# Patient Record
Sex: Female | Born: 2000 | Race: Black or African American | Hispanic: No | Marital: Single | Smoking: Never smoker
Health system: Southern US, Community
[De-identification: ages and names within clinical notes are randomized; demographics above are authoritative.]

## PROBLEM LIST (undated history)

## (undated) DIAGNOSIS — R0600 Dyspnea, unspecified: Secondary | ICD-10-CM

## (undated) DIAGNOSIS — K219 Gastro-esophageal reflux disease without esophagitis: Secondary | ICD-10-CM

## (undated) DIAGNOSIS — K5900 Constipation, unspecified: Secondary | ICD-10-CM

## (undated) DIAGNOSIS — M1990 Unspecified osteoarthritis, unspecified site: Secondary | ICD-10-CM

## (undated) DIAGNOSIS — K5909 Other constipation: Secondary | ICD-10-CM

## (undated) DIAGNOSIS — Z9109 Other allergy status, other than to drugs and biological substances: Secondary | ICD-10-CM

## (undated) DIAGNOSIS — E282 Polycystic ovarian syndrome: Secondary | ICD-10-CM

## (undated) DIAGNOSIS — R7303 Prediabetes: Secondary | ICD-10-CM

## (undated) DIAGNOSIS — I10 Essential (primary) hypertension: Secondary | ICD-10-CM

## (undated) DIAGNOSIS — K760 Fatty (change of) liver, not elsewhere classified: Secondary | ICD-10-CM

## (undated) DIAGNOSIS — R519 Headache, unspecified: Secondary | ICD-10-CM

## (undated) DIAGNOSIS — K589 Irritable bowel syndrome without diarrhea: Secondary | ICD-10-CM

## (undated) DIAGNOSIS — F419 Anxiety disorder, unspecified: Secondary | ICD-10-CM

## (undated) DIAGNOSIS — E739 Lactose intolerance, unspecified: Secondary | ICD-10-CM

## (undated) DIAGNOSIS — D649 Anemia, unspecified: Secondary | ICD-10-CM

## (undated) DIAGNOSIS — Z8679 Personal history of other diseases of the circulatory system: Secondary | ICD-10-CM

## (undated) DIAGNOSIS — M67432 Ganglion, left wrist: Secondary | ICD-10-CM

## (undated) DIAGNOSIS — F32A Depression, unspecified: Secondary | ICD-10-CM

## (undated) HISTORY — PX: TONSILLECTOMY AND ADENOIDECTOMY: SUR1326

## (undated) HISTORY — DX: Fatty (change of) liver, not elsewhere classified: K76.0

## (undated) HISTORY — DX: Lactose intolerance, unspecified: E73.9

## (undated) HISTORY — DX: Depression, unspecified: F32.A

## (undated) HISTORY — DX: Anxiety disorder, unspecified: F41.9

## (undated) HISTORY — DX: Essential (primary) hypertension: I10

## (undated) HISTORY — PX: ADENOIDECTOMY: SUR15

## (undated) HISTORY — PX: TONSILLECTOMY: SUR1361

## (undated) HISTORY — DX: Unspecified osteoarthritis, unspecified site: M19.90

## (undated) HISTORY — DX: Anemia, unspecified: D64.9

---
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
## 2020-02-06 ENCOUNTER — Ambulatory Visit (HOSPITAL_COMMUNITY)
Admission: EM | Admit: 2020-02-06 | Discharge: 2020-02-06 | Disposition: A | Discharge status: Home / Self Care | Payer: Medicaid Other | Attending: Family Medicine | Admitting: Family Medicine

## 2020-02-06 ENCOUNTER — Encounter (HOSPITAL_COMMUNITY): Payer: Self-pay | Admitting: Emergency Medicine

## 2020-02-06 ENCOUNTER — Other Ambulatory Visit: Payer: Self-pay

## 2020-02-06 DIAGNOSIS — S060X0A Concussion without loss of consciousness, initial encounter: Secondary | ICD-10-CM

## 2020-02-06 DIAGNOSIS — S0003XA Contusion of scalp, initial encounter: Secondary | ICD-10-CM

## 2020-02-06 MED ORDER — ONDANSETRON HCL 8 MG PO TABS: 8.0000 mg | ORAL_TABLET | Freq: Three times a day (TID) | ORAL | 0 refills | Status: DC | PRN

## 2020-02-06 MED ORDER — IBUPROFEN 600 MG PO TABS: 600.0000 mg | ORAL_TABLET | Freq: Four times a day (QID) | ORAL | 0 refills | Status: DC | PRN

## 2020-02-06 NOTE — Discharge Instructions (Signed)
  Rest  read concussion information Limit screen time Take ibuprofen 2 times a day for headache pain, take Zofran as needed for nausea Call your primary care doctor if not improving by the end of the week

## 2020-02-06 NOTE — ED Triage Notes (Signed)
PT hit her head while getting into a bunk bed at 1450 today. PT is now dizzy, has a headache, and eye pain. No LOC since incident. PT speaking appropriately and A&O x4.

## 2020-02-06 NOTE — ED Provider Notes (Signed)
[place]    CSN: [id number] Arrival date & time: 02/06/20  1637      History   Chief Complaint No chief complaint on file.   HPI Jacqueline Gonzalez is a 19 y.o. female.   HPI   Patient has back bedside her bedroom at home.  She was getting onto her bunk bed in order to lay on the bed and play video games.  She has leaned forward and did not bend down far enough and hit the top of her head on the rail of the upper bunk. She states it was immediately severely painful and she had to sit on the floor for a few minutes After this she was able to get into the bed, but felt "awful" She did not lose consciousness.  Ever since then, however, she has a headache, slightly dizzy, feels tired, feels nauseated No history of migraines No vomiting No numbness or weakness in arms or legs Brought in by her mother for evaluation  Past Medical History:  Diagnosis Date   Constipation    Environmental allergies    Lactose intolerance     Patient Active Problem List   Diagnosis Date Noted   Ganglion cyst of dorsum of left wrist 11/03/2019   Breast tenderness 08/04/2019   Constipation 01/21/2018   Irregular periods 01/21/2018   Generalized anxiety disorder 01/21/2018    Past Surgical History:  Procedure Laterality Date   ADENOIDECTOMY     TONSILLECTOMY      OB History   No obstetric history on file.      Home Medications    Prior to Admission medications   Medication Sig Start Date End Date Taking? Authorizing Provider  cetirizine (ZYRTEC) 10 MG tablet Take 1 tablet (10 mg total) by mouth daily. 11/22/18  Yes Tammi Klippel, Sherin, DO  docusate sodium (COLACE) 100 MG capsule Take 1 capsule (100 mg total) by mouth 2 (two) times daily. 11/03/19   Shirley, [place], DO  ibuprofen (ADVIL) 600 MG tablet Take 1 tablet (600 mg total) by mouth every 6 (six) hours as needed. 02/06/20   Raylene Everts, MD  ondansetron (ZOFRAN) 8 MG tablet Take 1 tablet (8 mg total) by mouth  every 8 (eight) hours as needed for nausea or vomiting. 02/06/20   Raylene Everts, MD  polyethylene glycol powder (GLYCOLAX/MIRALAX) 17 GM/SCOOP powder Take 17 g by mouth 2 (two) times daily as needed for mild constipation or moderate constipation. 11/03/19   Shirley, [place], DO    Family History Family History  Problem Relation Age of Onset   Asthma Father    Diabetes Maternal Grandmother    Diabetes Paternal Grandmother    Kidney disease Paternal Alanda Slim' disease Paternal Aunt     Social History Social History   Tobacco Use   Smoking status: Never Smoker   Smokeless tobacco: Never Used  Substance Use Topics   Alcohol use: Never   Drug use: Never     Allergies   Blue dyes (parenteral), Blueberry [vaccinium angustifolium], and Shellfish allergy   Review of Systems Review of Systems See HPI  Physical Exam Triage Vital Signs ED Triage Vitals  Enc Vitals Group     BP 02/06/20 1707 125/67     Pulse Rate 02/06/20 1707 75     Resp 02/06/20 1707 16     Temp 02/06/20 1707 99 F (37.2 C)     Temp Source 02/06/20 1707 Oral     SpO2 02/06/20 1707 100 %  Weight --      Height --      Head Circumference --      Peak Flow --      Pain Score 02/06/20 1708 9     Pain Loc --      Pain Edu? --      Excl. in [place]? --    No data found.  Updated Vital Signs BP 125/67    Pulse 75    Temp 99 F (37.2 C) (Oral)    Resp 16    LMP 01/03/2020    SpO2 100%      Physical Exam Constitutional:      General: She is not in acute distress.    Appearance: She is well-developed. She is obese.     Comments: Appears uncomfortable  HENT:     Head: Normocephalic and atraumatic.     Comments: No visible bruising or bleeding on the top of the scalp    Right Ear: Tympanic membrane and ear canal normal.     Left Ear: Tympanic membrane and ear canal normal.     Nose: Nose normal.     Mouth/Throat:     Pharynx: No posterior oropharyngeal erythema.  Eyes:      Conjunctiva/sclera: Conjunctivae normal.     Pupils: Pupils are equal, round, and reactive to light.  Cardiovascular:     Rate and Rhythm: Normal rate.  Pulmonary:     Effort: Pulmonary effort is normal. No respiratory distress.  Abdominal:     General: There is no distension.     Palpations: Abdomen is soft.  Musculoskeletal:        General: Normal range of motion.     Cervical back: Normal range of motion.  Skin:    General: Skin is warm and dry.  Neurological:     Mental Status: She is alert.     Cranial Nerves: No cranial nerve deficit.     Sensory: No sensory deficit.     Motor: No weakness.     Coordination: Coordination normal.     Gait: Gait normal.     Deep Tendon Reflexes: Reflexes normal.  Psychiatric:        Behavior: Behavior normal.     Comments: Quiet.  Short responses      UC Treatments / Results  Labs (all labs ordered are listed, but only abnormal results are displayed) Labs Reviewed - No data to display  EKG   Radiology No results found.  Procedures Procedures (including critical care time)  Medications Ordered in UC Medications - No data to display  Initial Impression / Assessment and Plan / UC Course  I have reviewed the triage vital signs and the nursing notes.  Pertinent labs & imaging results that were available during my care of the patient were reviewed by me and considered in my medical decision making (see chart for details).     Reviewed that she has a concussion.  May last as much as a week or 2. Final Clinical Impressions(s) / UC Diagnoses   Final diagnoses:  Concussion without loss of consciousness, initial encounter  Contusion of scalp, initial encounter     Discharge Instructions      Rest  read concussion information Limit screen time Take ibuprofen 2 times a day for headache pain, take Zofran as needed for nausea Call your primary care doctor if not improving by the end of the week   ED Prescriptions     Medication Sig Dispense Auth.  Provider   ibuprofen (ADVIL) 600 MG tablet Take 1 tablet (600 mg total) by mouth every 6 (six) hours as needed. 30 tablet Raylene Everts, MD   ondansetron (ZOFRAN) 8 MG tablet Take 1 tablet (8 mg total) by mouth every 8 (eight) hours as needed for nausea or vomiting. 20 tablet Raylene Everts, MD     PDMP not reviewed this encounter.   Raylene Everts, MD 02/06/20 [phone]

## 2020-02-15 ENCOUNTER — Other Ambulatory Visit: Payer: Self-pay

## 2020-02-15 ENCOUNTER — Encounter: Payer: Self-pay | Admitting: Family Medicine

## 2020-02-15 ENCOUNTER — Ambulatory Visit (INDEPENDENT_AMBULATORY_CARE_PROVIDER_SITE_OTHER): Payer: Medicaid Other | Admitting: Family Medicine

## 2020-02-15 DIAGNOSIS — K5904 Chronic idiopathic constipation: Secondary | ICD-10-CM | POA: Diagnosis not present

## 2020-02-15 DIAGNOSIS — F411 Generalized anxiety disorder: Secondary | ICD-10-CM | POA: Diagnosis not present

## 2020-02-15 DIAGNOSIS — N926 Irregular menstruation, unspecified: Secondary | ICD-10-CM

## 2020-02-15 NOTE — Assessment & Plan Note (Signed)
-  No medications needed at this time -Encouraged to continue to maintain strong support system currently in place  -Consider therapy if worsens

## 2020-02-15 NOTE — Patient Instructions (Addendum)
It was so great meeting you today!  I am glad that you have a strong support system in place, continue to utilize that. Remember to engage in safe sex practices when you are sexually active.  We will see you back in a year, please contact us sooner if you have any questions or concerns. Thank you for allowing me to be a part of your medical care!

## 2020-02-15 NOTE — Assessment & Plan Note (Addendum)
-  Patient not concerned at this time as this has been typical for her since the age of 64. -Expressed that if patient becomes concerned later to reach out to [place], at this time no further action needed  -Discussed safe sex practices for when patient is sexually active

## 2020-02-15 NOTE — Assessment & Plan Note (Signed)
-  Continue current bowel regimen as needed

## 2020-02-15 NOTE — Progress Notes (Signed)
° ° °  SUBJECTIVE:   CHIEF COMPLAINT / HPI:   Irregular periods  PERTINENT  PMH / PSH:  Irregular menstrual cycle Reports having 10-15 total from the age of 19 years old. Does not seem concerned about this at this time. Endorses that this is typical for her. Currently not sexually active.  Generalized anxiety disorder Currently not on any medication. Denies any anxiety or nervousness today. States that her mood is fine but has been fluctuating in the past month with "drama going on." Tells me that she has a strong support system of family and friends. Has tried therapy in the past but states that it has not helped. Denies any suicidal or homicidal ideations.   Constipation Reports having regular BMs, denies any blood in stool or hematuria. Takes miralax as needed.     OBJECTIVE:   BP 110/80    Pulse 90    Ht 5' 7.5" (1.715 m)    Wt (!) 227 lb (103 kg)    SpO2 98%    BMI 35.03 kg/m   General: Patient well-appearing, in no acute distress. Cardio: regular rate and rhythm, no murmurs or gallops noted Resp: lungs clear to auscultation bilaterally, no rhonchi or rales appreciated Abdomen: nontender, nondistended, active bowel sounds  Neuro: CN 2-12 grossly intact, 5/5 strength UE and LE bilaterally  Psych: mood appropriate, denies suicidal or homicidal ideations   ASSESSMENT/PLAN:   Irregular periods -Patient not concerned at this time as this has been typical for her since the age of 19. -Expressed that if patient becomes concerned later to reach out to [place], at this time no further action needed  -Discussed safe sex practices for when patient is sexually active   Generalized anxiety disorder -No medications needed at this time -Encouraged to continue to maintain strong support system currently in place  -Consider therapy if worsens   Constipation -Continue current bowel regimen as needed     Donney Dice, [place]

## 2020-02-18 HISTORY — PX: WRIST GANGLION EXCISION: SUR520

## 2020-05-23 ENCOUNTER — Encounter (HOSPITAL_COMMUNITY): Payer: Self-pay | Admitting: Emergency Medicine

## 2020-05-23 ENCOUNTER — Other Ambulatory Visit: Payer: Self-pay

## 2020-05-23 ENCOUNTER — Ambulatory Visit (HOSPITAL_COMMUNITY)
Admission: EM | Admit: 2020-05-23 | Discharge: 2020-05-23 | Disposition: A | Discharge status: Home / Self Care | Payer: Medicaid Other | Attending: Family Medicine | Admitting: Family Medicine

## 2020-05-23 DIAGNOSIS — M791 Myalgia, unspecified site: Secondary | ICD-10-CM

## 2020-05-23 MED ORDER — NAPROXEN 500 MG PO TABS: 500.0000 mg | ORAL_TABLET | Freq: Two times a day (BID) | ORAL | 0 refills | Status: DC

## 2020-05-23 NOTE — Discharge Instructions (Signed)
Naprosyn twice daily Ice and gentle stretching Follow up if not improving or worsening

## 2020-05-23 NOTE — ED Provider Notes (Signed)
[facility]    CSN: [id number] Arrival date & time: 05/23/20  1153      History   Chief Complaint Chief Complaint  Patient presents with  . Leg Pain    HPI Jacqueline Gonzalez is a 19 y.o. female presenting today for evaluation of bilateral leg pain.  Patient reports that she did leg day 3 days ago and after workout her quadriceps began to hurt.  She reports pain with going up and down stairs.  She reports doing squats with medicine balls and other weighted leg exercises.  She denies specific injury fall or trauma.  Denies pain to calves.  Denies pain or muscle aches elsewhere in the body.  She reports that she does not drink a lot of water.  HPI  Past Medical History:  Diagnosis Date  . Constipation   . Environmental allergies   . Lactose intolerance     Patient Active Problem List   Diagnosis Date Noted  . Ganglion cyst of dorsum of left wrist 11/03/2019  . Breast tenderness 08/04/2019  . Constipation 01/21/2018  . Irregular periods 01/21/2018  . Generalized anxiety disorder 01/21/2018    Past Surgical History:  Procedure Laterality Date  . ADENOIDECTOMY    . TONSILLECTOMY      OB History   No obstetric history on file.      Home Medications    Prior to Admission medications   Medication Sig Start Date End Date Taking? Authorizing Provider  amoxicillin-clavulanate (AUGMENTIN) 875-125 MG tablet Take by mouth. 05/19/20 05/29/20 Yes [provider]  cetirizine (ZYRTEC) 10 MG tablet Take 1 tablet (10 mg total) by mouth daily. 11/22/18   Caroline More, DO  docusate sodium (COLACE) 100 MG capsule Take 1 capsule (100 mg total) by mouth 2 (two) times daily. 11/03/19   Shirley, [place], DO  ibuprofen (ADVIL) 600 MG tablet Take 1 tablet (600 mg total) by mouth every 6 (six) hours as needed. Patient not taking: Reported on 02/15/2020 02/06/20   Raylene Everts, MD  naproxen (NAPROSYN) 500 MG tablet Take 1 tablet (500 mg total) by mouth 2 (two) times  daily. 05/23/20   Suezette Lafave C, PA-C  ondansetron (ZOFRAN) 8 MG tablet Take 1 tablet (8 mg total) by mouth every 8 (eight) hours as needed for nausea or vomiting. Patient not taking: Reported on 02/15/2020 02/06/20   Raylene Everts, MD  polyethylene glycol powder [facility]) 17 GM/SCOOP powder Take 17 g by mouth 2 (two) times daily as needed for mild constipation or moderate constipation. 11/03/19   Shirley, [place], DO    Family History Family History  Problem Relation Age of Onset  . Asthma Father   . Diabetes Maternal Grandmother   . Diabetes Paternal Grandmother   . Kidney disease Paternal Grandfather   . Graves' disease Paternal Aunt     Social History Social History   Tobacco Use  . Smoking status: Never Smoker  . Smokeless tobacco: Never Used  Substance Use Topics  . Alcohol use: Never  . Drug use: Never     Allergies   Blue dyes (parenteral), Blueberry [vaccinium angustifolium], and Shellfish allergy   Review of Systems Review of Systems  Constitutional: Negative for fatigue and fever.  Eyes: Negative for visual disturbance.  Respiratory: Negative for shortness of breath.   Cardiovascular: Negative for chest pain.  Gastrointestinal: Negative for abdominal pain, nausea and vomiting.  Musculoskeletal: Positive for myalgias. Negative for arthralgias and joint swelling.  Skin: Negative for color change, rash  and wound.  Neurological: Negative for dizziness, weakness, light-headedness and headaches.     Physical Exam Triage Vital Signs ED Triage Vitals  Enc Vitals Group     BP 05/23/20 1310 114/71     Pulse Rate 05/23/20 1310 80     Resp 05/23/20 1310 17     Temp 05/23/20 1310 98 F (36.7 C)     Temp Source 05/23/20 1310 Oral     SpO2 05/23/20 1310 99 %     Weight --      Height --      Head Circumference --      Peak Flow --      Pain Score 05/23/20 1308 9     Pain Loc --      Pain Edu? --      Excl. in [place]? --    No data  found.  Updated Vital Signs BP 114/71 (BP Location: Right Arm)   Pulse 80   Temp 98 F (36.7 C) (Oral)   Resp 17   LMP 04/22/2020 (Approximate)   SpO2 99%   Visual Acuity Right Eye Distance:   Left Eye Distance:   Bilateral Distance:    Right Eye Near:   Left Eye Near:    Bilateral Near:     Physical Exam Vitals and nursing note reviewed.  Constitutional:      Appearance: She is well-developed.     Comments: No acute distress  HENT:     Head: Normocephalic and atraumatic.     Nose: Nose normal.  Eyes:     Conjunctiva/sclera: Conjunctivae normal.  Cardiovascular:     Rate and Rhythm: Normal rate.  Pulmonary:     Effort: Pulmonary effort is normal. No respiratory distress.  Abdominal:     General: There is no distension.  Musculoskeletal:        General: Normal range of motion.     Cervical back: Neck supple.     Comments: Bilateral quadriceps tender to palpation throughout, full active range of motion at hips and knees, strength at hips and knees 5/5 and equal bilaterally, patellar reflex 2+ bilaterally  Skin:    General: Skin is warm and dry.  Neurological:     Mental Status: She is alert and oriented to person, place, and time.      UC Treatments / Results  Labs (all labs ordered are listed, but only abnormal results are displayed) Labs Reviewed - No data to display  EKG   Radiology No results found.  Procedures Procedures (including critical care time)  Medications Ordered in UC Medications - No data to display  Initial Impression / Assessment and Plan / UC Course  I have reviewed the triage vital signs and the nursing notes.  Pertinent labs & imaging results that were available during my care of the patient were reviewed by me and considered in my medical decision making (see chart for details).     Bilateral quadricep tenderness and pain after patient performed weighted exercises targeting legs, no other myalgias, low suspicion of rhabdo,  timing seems normal for muscle soreness after increased exercise.  Recommend to continue to monitor ice, gentle stretching and anti-inflammatories.  Discussed strict return precautions. Patient verbalized understanding and is agreeable with plan.  Final Clinical Impressions(s) / UC Diagnoses   Final diagnoses:  Muscle soreness     Discharge Instructions     Naprosyn twice daily Ice and gentle stretching Follow up if not improving or worsening    ED  Prescriptions    Medication Sig Dispense Auth. Provider   naproxen (NAPROSYN) 500 MG tablet Take 1 tablet (500 mg total) by mouth 2 (two) times daily. 30 tablet Jersie Beel, [place] C, PA-C     PDMP not reviewed this encounter.   Lamel Mccarley, [place] C, PA-C 05/23/20 1440

## 2020-05-23 NOTE — ED Triage Notes (Signed)
Pt presents with bilateral thigh pain xs 3 days. States started after working out.

## 2020-05-27 ENCOUNTER — Ambulatory Visit: Payer: Medicaid Other | Admitting: Student in an Organized Health Care Education/Training Program

## 2020-05-28 ENCOUNTER — Other Ambulatory Visit: Payer: Self-pay | Admitting: Family Medicine

## 2020-05-28 ENCOUNTER — Encounter: Payer: Self-pay | Admitting: Family Medicine

## 2020-05-28 DIAGNOSIS — B3731 Acute candidiasis of vulva and vagina: Secondary | ICD-10-CM

## 2020-05-28 DIAGNOSIS — B373 Candidiasis of vulva and vagina: Secondary | ICD-10-CM

## 2020-05-28 MED ORDER — FLUCONAZOLE 150 MG PO TABS: 150.0000 mg | ORAL_TABLET | Freq: Once | ORAL | 0 refills | Status: AC

## 2020-05-30 ENCOUNTER — Ambulatory Visit: Payer: Medicaid Other | Admitting: Family Medicine

## 2020-05-30 NOTE — Progress Notes (Deleted)
    SUBJECTIVE:   CHIEF COMPLAINT / HPI: Thigh pain  Jacqueline Gonzalez is a 19 year old female presenting for evaluation of thigh pain.  PERTINENT  PMH / PSH: Anxiety, wrist ganglion cyst  OBJECTIVE:   There were no vitals taken for this visit.  ***  ASSESSMENT/PLAN:   No problem-specific Assessment & Plan notes found for this encounter.     Patriciaann Clan, [place]

## 2020-08-15 ENCOUNTER — Telehealth: Payer: Medicaid Other | Admitting: Family

## 2020-08-15 DIAGNOSIS — R197 Diarrhea, unspecified: Secondary | ICD-10-CM

## 2020-08-15 NOTE — Progress Notes (Signed)
We are sorry that you are not feeling well.  Here is how we plan to help!  Based on what you have shared with me it looks like you have Acute Infectious Diarrhea.  Most cases of acute diarrhea are due to infections with virus and bacteria and are self-limited conditions lasting less than 14 days.  For your symptoms you may take Imodium 2 mg tablets that are over the counter at your local pharmacy. Take two tablet now and then one after each loose stool up to 6 a day.  Antibiotics are not needed for most people with diarrhea.  With the symptoms you are describing including diarrhea and severe headache, I am concerned for possible COVID. You need to get tested for COVID today.     HOME CARE  We recommend changing your diet to help with your symptoms for the next few days.  Drink plenty of fluids that contain water salt and sugar. Sports drinks such as Gatorade may help.   You may try broths, soups, bananas, applesauce, soft breads, mashed potatoes or crackers.   You are considered infectious for as long as the diarrhea continues. Hand washing or use of alcohol based hand sanitizers is recommend.  It is best to stay out of work or school until your symptoms stop.   GET HELP RIGHT AWAY  If you have dark yellow colored urine or do not pass urine frequently you should drink more fluids.    If your symptoms worsen   If you feel like you are going to pass out (faint)  You have a new problem  MAKE SURE YOU   Understand these instructions.  Will watch your condition.  Will get help right away if you are not doing well or get worse.  Your e-visit answers were reviewed by a board certified advanced clinical practitioner to complete your personal care plan.  Depending on the condition, your plan could have included both over the counter or prescription medications.  If there is a problem please reply  once you have received a response from your provider.  Your safety is important to  [place].  If you have drug allergies check your prescription carefully.    You can use MyChart to ask questions about today's visit, request a non-urgent call back, or ask for a work or school excuse for 24 hours related to this e-Visit. If it has been greater than 24 hours you will need to follow up with your provider, or enter a new e-Visit to address those concerns.   You will get an e-mail in the next two days asking about your experience.  I hope that your e-visit has been valuable and will speed your recovery. Thank you for using e-visits.  Greater than 5 minutes, yet less than 10 minutes of time have been spent researching, coordinating, and implementing care for this patient.

## 2020-08-28 ENCOUNTER — Other Ambulatory Visit: Payer: Self-pay

## 2020-08-28 ENCOUNTER — Ambulatory Visit (INDEPENDENT_AMBULATORY_CARE_PROVIDER_SITE_OTHER): Payer: Medicaid Other | Admitting: Student in an Organized Health Care Education/Training Program

## 2020-08-28 VITALS — BP 110/70 | HR 68 | Ht 67.5 in | Wt 227.0 lb

## 2020-08-28 DIAGNOSIS — R109 Unspecified abdominal pain: Secondary | ICD-10-CM | POA: Diagnosis not present

## 2020-08-28 DIAGNOSIS — R1084 Generalized abdominal pain: Secondary | ICD-10-CM | POA: Diagnosis not present

## 2020-08-28 DIAGNOSIS — M549 Dorsalgia, unspecified: Secondary | ICD-10-CM | POA: Diagnosis not present

## 2020-08-28 LAB — POCT URINALYSIS DIP (MANUAL ENTRY)
Bilirubin, UA: NEGATIVE
Blood, UA: NEGATIVE
Glucose, UA: NEGATIVE mg/dL
Ketones, POC UA: NEGATIVE mg/dL
Leukocytes, UA: NEGATIVE
Nitrite, UA: NEGATIVE
Protein Ur, POC: NEGATIVE mg/dL
Spec Grav, UA: 1.02 (ref 1.010–1.025)
Urobilinogen, UA: 0.2 E.U./dL
pH, UA: 5.5 (ref 5.0–8.0)

## 2020-08-28 LAB — POCT URINE PREGNANCY: Preg Test, Ur: NEGATIVE

## 2020-08-28 NOTE — Patient Instructions (Signed)
It was a pleasure to see you today!  To summarize our discussion for this visit:  Your symptoms don't point to one specific diagnosis at this time so we will look a little closer with a urine test and some blood work. I would recommend we consider an atypical presentation of a migraine and/or IBS symptoms.   I can prescribe a medication to help with the headaches after confirming no pregnancy and will also prescribe something to help regulate your bowel movements (fiber and probiotic)  Some additional health maintenance measures we should update are: Health Maintenance Due  Topic Date Due  . Hepatitis C Screening  Never done  . HIV Screening  Never done  . INFLUENZA VACCINE  02/18/2020  . TETANUS/TDAP  Never done  . COVID-19 Vaccine (3 - Booster for Moderna series) 06/20/2020  .    Please return to our clinic to see [place] in about 2 weeks to follow up on your symptoms.  Call the clinic at [phone] if your symptoms worsen or you have any concerns.   Thank you for allowing me to take part in your care,  Dr. Doristine Mango

## 2020-08-28 NOTE — Progress Notes (Addendum)
   SUBJECTIVE:   CHIEF COMPLAINT / HPI: headache, diarrhea, pain  Diarrhea began about a week ago but also has IBS-d. January 1st was LMP. Spotting the first of this month. Usually not very regular. Not on birth control. Not sexually active with males. No concerns for STIs.  - diarrhea improving - feeling nauseas and no vomiting. Not eating much due to low appetite but able to drink plenty of fluids.  - no fevers but felt warm - headaches started 2-3 weeks ago. Frontal and sharp/stabbing, or sometimes dull and build up. Sometimes has photophobia and phonophobia. They go away with headaches. They are occurring every day and last majority of the day. It will go away completely when she is distracted. Sometimes wakes up with the headache but not waking up from the headache. No visual changes.  - abdominal pain in lower quadrants.cramping pain. It gets worse when she is having BMs/diarrhea. Usually warm showers makes it better. Very similar to her IBS pains. Happens mostly when she is settled down at night or early mornings before school. Last BM 2 nights ago and was not diarrhea. Had only a small amount of pain and no blood. - urination is normal, no pain, blood - possibly increased stress from school in the past few weeks due to work overload. Also has anxiety about covid when in close quarters with classmates and stools are uncomfortable to sit on  OBJECTIVE:   BP 110/70   Pulse 68   Ht 5' 7.5" (1.715 m)   Wt 227 lb (103 kg)   SpO2 99%   BMI 35.03 kg/m   General: NAD, pleasant, able to participate in exam Cardiac: RRR, normal heart sounds, no murmurs. 2+ radial and PT pulses bilaterally Respiratory: CTAB, normal effort, No wheezes, rales or rhonchi Abdomen: soft, mildly tender diffusely and worse in LLQ, nondistended, no hepatic or splenomegaly, +BS/ Negative guarding, rebound Extremities: no edema. WWP. Skin: warm and dry, no rashes noted Neuro: alert and oriented, no focal  deficits Psych: Normal affect and mood  ASSESSMENT/PLAN:   Nonspecific abdominal pain Generalized abdominal pain without specifics to indicate cause.  Will do basic labs to evaluate for UTI, upreg, electrolytes and CBC to help with etiology.  Her symptoms could be related to her IIBS, especially in setting of increased stress with school. - prescribed probiotic and fiber supplement.  - considering the coinciding symptoms of headache, could be a complex migraine. Trial of sumatriptan and would like for her to follow up in 2 weeks to evaluate for improvement.   - if treatment has improved symptoms, may want to consider a dual-benefit medication for treating anxiety/headaches such as topamax or perhaps a SSRI if there is a somatic component. - will discontinue docusate from medication history as this medication is ineffective and perhaps detrimental to constipation.      [place]

## 2020-08-29 LAB — CBC
Hematocrit: 36 % (ref 34.0–46.6)
Hemoglobin: 12.2 g/dL (ref 11.1–15.9)
MCH: 28.2 pg (ref 26.6–33.0)
MCHC: 33.9 g/dL (ref 31.5–35.7)
MCV: 83 fL (ref 79–97)
Platelets: 228 10*3/uL (ref 150–450)
RBC: 4.33 x10E6/uL (ref 3.77–5.28)
RDW: 12.5 % (ref 11.7–15.4)
WBC: 5.5 10*3/uL (ref 3.4–10.8)

## 2020-08-29 LAB — COMPREHENSIVE METABOLIC PANEL
ALT: 29 IU/L (ref 0–32)
AST: 18 IU/L (ref 0–40)
Albumin/Globulin Ratio: 1.3 (ref 1.2–2.2)
Albumin: 4.3 g/dL (ref 3.9–5.0)
Alkaline Phosphatase: 86 IU/L (ref 42–106)
BUN/Creatinine Ratio: 9 (ref 9–23)
BUN: 6 mg/dL (ref 6–20)
Bilirubin Total: 0.2 mg/dL (ref 0.0–1.2)
CO2: 20 mmol/L (ref 20–29)
Calcium: 9.1 mg/dL (ref 8.7–10.2)
Chloride: 103 mmol/L (ref 96–106)
Creatinine, Ser: 0.68 mg/dL (ref 0.57–1.00)
GFR calc Af Amer: 147 mL/min/{1.73_m2} (ref >59)
GFR calc non Af Amer: 127 mL/min/{1.73_m2} (ref >59)
Globulin, Total: 3.3 g/dL (ref 1.5–4.5)
Glucose: 100 mg/dL — ABNORMAL HIGH (ref 65–99)
Potassium: 4.1 mmol/L (ref 3.5–5.2)
Sodium: 138 mmol/L (ref 134–144)
Total Protein: 7.6 g/dL (ref 6.0–8.5)

## 2020-08-29 MED ORDER — SUMATRIPTAN SUCCINATE 50 MG PO TABS: 50.0000 mg | ORAL_TABLET | Freq: Every day | ORAL | 0 refills | Status: DC | PRN

## 2020-08-29 MED ORDER — METAMUCIL SMOOTH TEXTURE 58.6 % PO POWD: 1.0000 | Freq: Every day | ORAL | 2 refills | Status: DC

## 2020-08-29 MED ORDER — LACTOBACILLUS PO TABS: 1.0000 | ORAL_TABLET | Freq: Every day | ORAL | 1 refills | Status: DC

## 2020-08-30 DIAGNOSIS — R109 Unspecified abdominal pain: Secondary | ICD-10-CM | POA: Insufficient documentation

## 2020-08-30 NOTE — Addendum Note (Signed)
Addended by: [name] on: 08/30/2020 02:19 PM   Modules accepted: Orders

## 2020-08-30 NOTE — Assessment & Plan Note (Addendum)
Generalized abdominal pain without specifics to indicate cause.  Will do basic labs to evaluate for UTI, upreg, electrolytes and CBC to help with etiology.  Her symptoms could be related to her IIBS, especially in setting of increased stress with school. - prescribed probiotic and fiber supplement.  - considering the coinciding symptoms of headache, could be a complex migraine. Trial of sumatriptan and would like for her to follow up in 2 weeks to evaluate for improvement.   - if treatment has improved symptoms, may want to consider a dual-benefit medication for treating anxiety/headaches such as topamax or perhaps a SSRI if there is a somatic component. - will discontinue docusate from medication history as this medication is ineffective and perhaps detrimental to constipation.

## 2020-09-03 ENCOUNTER — Telehealth: Payer: Self-pay

## 2020-09-03 ENCOUNTER — Ambulatory Visit (INDEPENDENT_AMBULATORY_CARE_PROVIDER_SITE_OTHER): Payer: Medicaid Other | Admitting: Family Medicine

## 2020-09-03 ENCOUNTER — Encounter: Payer: Self-pay | Admitting: Family Medicine

## 2020-09-03 ENCOUNTER — Other Ambulatory Visit: Payer: Self-pay

## 2020-09-03 VITALS — BP 125/80 | HR 84 | Ht 69.0 in | Wt 233.2 lb

## 2020-09-03 DIAGNOSIS — Z79899 Other long term (current) drug therapy: Secondary | ICD-10-CM

## 2020-09-03 DIAGNOSIS — G44209 Tension-type headache, unspecified, not intractable: Secondary | ICD-10-CM

## 2020-09-03 DIAGNOSIS — R519 Headache, unspecified: Secondary | ICD-10-CM | POA: Insufficient documentation

## 2020-09-03 LAB — POCT URINE PREGNANCY: Preg Test, Ur: NEGATIVE

## 2020-09-03 MED ORDER — AMITRIPTYLINE HCL 10 MG PO TABS: 10.0000 mg | ORAL_TABLET | Freq: Every day | ORAL | 0 refills | Status: DC

## 2020-09-03 NOTE — Assessment & Plan Note (Addendum)
Headache without usual migraine signs and not alleviated by rest.  Patient has no visual changes.  Describes headache is sharp pain and localizes to the central forehead.  Appears to have mixed headache tight as she reports headache is pulsatile in nature however does not bandlike characteristic to increase suspicion for tension type headache.  Patient tried sumatriptan and had negative reaction.  Will not continue this.  Due to history of fall and persistent headache, believe patient warrants head  Imaging with CT. -Recommend trying Excedrin OTC -will do trial of Elavil at night  -f/u in 3 weeks  -urine pregnancy test negative today

## 2020-09-03 NOTE — Progress Notes (Signed)
    SUBJECTIVE:   CHIEF COMPLAINT / HPI: HA   HA Patient reports HA has been present for 3 weeks.  She reports that this headache is different headaches that she has had in the past.  Headache is located in her central forehead.  She reports having IBS and feeling nauseous chronically so is not sure whether or not this is related to the headache.  She denies any vision changes.  Patient states that 2 weeks ago she had a fall and hit her forehead on a chair.  She denies any loss of consciousness.  She reports that she did have a small nodule on her forehead after the fall.  She denies any changes to her memory abilities.  Patient states that she has issues with anxiety/depression and is not on any current medications  PERTINENT  PMH / PSH:  GAD   OBJECTIVE:   BP 125/80   Pulse 84   Ht 5' 9"  (1.753 m)   Wt 233 lb 3.2 oz (105.8 kg)   SpO2 97%   BMI 34.44 kg/m   General: female appearing stated age in no acute distress HEENT: MMM, no oral lesions noted,Neck non-tender without lymphadenopathy, neck is not rigid, normal range of motion of neck Cardio: Normal S1 and S2, no S3 or S4. Rhythm is regular. No murmurs or rubs.  Bilateral radial pulses palpable Pulm: Clear to auscultation bilaterally, no crackles, wheezing, or diminished breath sounds. Normal respiratory effort, stable on room air Neuro: pt alert and oriented x4, patient able to recall items remembering, normal range of motion of bilateral upper and lower extremities, sensation is intact bilaterally, cranial nerves II-XII grossly intact bilaterally, 5/5 strength in bilateral upper and lower extremities, no abnormal gait, fine motor movements intact   ASSESSMENT/PLAN:   Headache Headache without usual migraine signs and not alleviated by rest.  Patient has no visual changes.  Describes headache is sharp pain and localizes to the central forehead.  Appears to have mixed headache tight as she reports headache is pulsatile in nature  however does not bandlike characteristic to increase suspicion for tension type headache.  Patient tried sumatriptan and had negative reaction.  Will not continue this.  Due to history of fall and persistent headache, believe patient warrants head  Imaging with CT. -Recommend trying Excedrin OTC -will do trial of Elavil at night  -f/u in 3 weeks  -urine pregnancy test negative today      Eulis Foster, MD [place]

## 2020-09-03 NOTE — Telephone Encounter (Signed)
Patient calls nurse line due to side effects of Sumatriptan. Patient reports taking first dose yesterday evening. Patient reports after taking medication she began having a burning sensation in forehead and "feeling high". Patient also reports "slight difficulty breathing", shortly after taking medication.   Patient is not experiencing any difficulty breathing at this time. Reports that she still has frontal headache. Requesting same day appointment to discuss other medication options for headache.   Scheduled with Dr. Alba Cory this afternoon. Strict ED precautions given.   Talbot Grumbling, RN

## 2020-09-03 NOTE — Telephone Encounter (Signed)
Thank you for notifying me

## 2020-09-03 NOTE — Patient Instructions (Signed)
I have prescribed a medication called Elavil to treat your headache. I recommend trying Excedrin over-the-counter prior to starting this medication to see if it will alleviate your headache.  Please follow-up with me in clinic in 3 weeks.   Please seek emergent care if you have a worsening headache, develop vomiting, begins to have weakness on one side of the body or become confused and cannot speak.

## 2020-09-17 ENCOUNTER — Other Ambulatory Visit: Payer: Self-pay | Admitting: Student in an Organized Health Care Education/Training Program

## 2020-09-25 ENCOUNTER — Other Ambulatory Visit: Payer: Self-pay | Admitting: Family Medicine

## 2020-09-25 NOTE — Telephone Encounter (Signed)
Good morning, I want to make sure that patient is trying excedrin first for her headaches before trying this medication per Dr. Vickki Muff recommendations at her last visit. Thanks

## 2020-09-26 NOTE — Telephone Encounter (Signed)
Contacted the pt this morning but she was not available. Spoke to mom. She stated she will have her daughter contact us back, if I do not hear for her I will attempt to call her back. Kallie Locks, [place]

## 2020-09-26 NOTE — Telephone Encounter (Signed)
Patient calls nurse line returning call. Patient reports she has been using Excedrin, however she is having to take 2 per day and still experiences "some" headache. Patient also reports Excedrin is expensive having to take 2 with little effect. Will forward to PCP.

## 2020-09-26 NOTE — Telephone Encounter (Signed)
Thank you :)

## 2020-11-14 ENCOUNTER — Telehealth: Payer: Medicaid Other | Admitting: Physician Assistant

## 2020-11-14 DIAGNOSIS — R109 Unspecified abdominal pain: Secondary | ICD-10-CM

## 2020-11-14 DIAGNOSIS — M545 Low back pain, unspecified: Secondary | ICD-10-CM

## 2020-11-14 NOTE — Progress Notes (Signed)
Based on what you shared with me, I feel your condition warrants further evaluation and I recommend that you be seen in a face to face office visit. Giving that abdominal pain, fatigue and weakness are accompanying the low back pain you have been having, you need to be seen in person to get a full examination and potential assessment of urine/labs/imaging if appropriate to rule out more serious causes of your current symptoms. This is not something we can treat via e-visit or video on demand visit. Please reach out to your PCP or be seen at a local Urgent care this evening. Please do not delay care.    NOTE: If you entered your credit card information for this eVisit, you will not be charged. You may see a "hold" on your card for the $35 but that hold will drop off and you will not have a charge processed.   If you are having a true medical emergency please call 911.      For an urgent face to face visit, [facility] has six urgent care centers for your convenience:     [facility] [place] [facility] [place] Get Driving Directions [phone] [place], [place] [address] . 8 am - 4 pm Monday - Friday    [facility]) Get Driving Directions [phone] [address] [place], [place] [address] . 8 am to 8 pm Monday-Friday . 10 am to 6 pm [facility] [place] ([place]) Get Driving Directions [phone]  [address] [place],  [place]  [address] . 8 am to 8 pm Monday-Friday . 8 am to 4 pm [facility] Get Driving Directions [phone] [place], [place], [place] [address] . 8 am to 8 pm Monday-Friday . 8 am to 4 pm [facility] at [facility] Get Driving Directions  [phone] [address] [place], [place] [address] . 8 am to 8 pm  Monday-Friday . 8 am to 4 pm [facility] [place] Get Driving Directions [phone] [address] [place], [place] [address] . 8 am to 8 pm Monday-Friday . 8 am to 4 pm Saturday-Sunday     Your MyChart E-visit questionnaire answers were reviewed by a board certified advanced clinical practitioner to complete your personal care plan based on your specific symptoms.  Thank you for using e-Visits.

## 2021-03-07 ENCOUNTER — Other Ambulatory Visit: Payer: Self-pay | Admitting: Physician Assistant

## 2021-03-07 ENCOUNTER — Telehealth: Payer: Medicaid Other | Admitting: Physician Assistant

## 2021-03-07 DIAGNOSIS — K047 Periapical abscess without sinus: Secondary | ICD-10-CM

## 2021-03-07 MED ORDER — AMOXICILLIN 500 MG PO CAPS: 500.0000 mg | ORAL_CAPSULE | Freq: Three times a day (TID) | ORAL | 0 refills | Status: AC

## 2021-03-07 MED ORDER — IBUPROFEN 600 MG PO TABS: 600.0000 mg | ORAL_TABLET | Freq: Three times a day (TID) | ORAL | 0 refills | Status: DC | PRN

## 2021-03-07 MED ORDER — AMOXICILLIN 500 MG PO CAPS: 500.0000 mg | ORAL_CAPSULE | Freq: Three times a day (TID) | ORAL | 0 refills | Status: DC

## 2021-03-07 NOTE — Patient Instructions (Signed)
Jacqueline Gonzalez, thank you for joining Mar Daring, PA-C for today's virtual visit.  While this provider is not your primary care provider (PCP), if your PCP is located in our provider database this encounter information will be shared with them immediately following your visit.  Consent: (Patient) Jacqueline Gonzalez provided verbal consent for this virtual visit at the beginning of the encounter.  Current Medications:  Current Outpatient Medications:    ibuprofen (ADVIL) 600 MG tablet, Take 1 tablet (600 mg total) by mouth every 8 (eight) hours as needed., Disp: 30 tablet, Rfl: 0   amitriptyline (ELAVIL) 10 MG tablet, TAKE 1 TABLET BY MOUTH EVERYDAY AT BEDTIME, Disp: 30 tablet, Rfl: 0   amoxicillin (AMOXIL) 500 MG capsule, Take 1 capsule (500 mg total) by mouth 3 (three) times daily for 10 days., Disp: 30 capsule, Rfl: 0   cetirizine (ZYRTEC) 10 MG tablet, Take 1 tablet (10 mg total) by mouth daily., Disp: 30 tablet, Rfl: 11   Lactobacillus TABS, Take 1 tablet by mouth daily., Disp: 30 tablet, Rfl: 1   naproxen (NAPROSYN) 500 MG tablet, Take 1 tablet (500 mg total) by mouth 2 (two) times daily., Disp: 30 tablet, Rfl: 0   ondansetron (ZOFRAN) 8 MG tablet, Take 1 tablet (8 mg total) by mouth every 8 (eight) hours as needed for nausea or vomiting. (Patient not taking: Reported on 02/15/2020), Disp: 20 tablet, Rfl: 0   polyethylene glycol powder (GLYCOLAX/MIRALAX) 17 GM/SCOOP powder, Take 17 g by mouth 2 (two) times daily as needed for mild constipation or moderate constipation., Disp: 3350 g, Rfl: 1   psyllium (METAMUCIL SMOOTH TEXTURE) 58.6 % powder, Take 1 packet by mouth daily., Disp: 283 g, Rfl: 2   SUMAtriptan (IMITREX) 50 MG tablet, Take 1 tablet (50 mg total) by mouth daily as needed for migraine. May repeat in 2 hours if headache persists or recurs., Disp: 20 tablet, Rfl: 0   Medications ordered in this encounter:  Meds ordered this encounter  Medications   DISCONTD: amoxicillin  (AMOXIL) 500 MG capsule    Sig: Take 1 capsule (500 mg total) by mouth 3 (three) times daily for 10 days.    Dispense:  30 capsule    Refill:  0    Order Specific Question:   Supervising Provider    Answer:   MILLER, BRIAN [[id number]]   amoxicillin (AMOXIL) 500 MG capsule    Sig: Take 1 capsule (500 mg total) by mouth 3 (three) times daily for 10 days.    Dispense:  30 capsule    Refill:  0    Order Specific Question:   Supervising Provider    Answer:   MILLER, BRIAN [[id number]]   ibuprofen (ADVIL) 600 MG tablet    Sig: Take 1 tablet (600 mg total) by mouth every 8 (eight) hours as needed.    Dispense:  30 tablet    Refill:  0    Order Specific Question:   Supervising Provider    Answer:   Sabra Heck, BRIAN [[id number]]     *If you need refills on other medications prior to your next appointment, please contact your pharmacy*  Follow-Up: Call back or seek an in-person evaluation if the symptoms worsen or if the condition fails to improve as anticipated.  Other Instructions Dental Abscess  A dental abscess is an area of pus in or around a tooth. It comes from an infection. It can cause pain and other symptoms. Treatment will help withsymptoms and prevent the infection from spreading. Follow  these instructions at home: Medicines Take over-the-counter and prescription medicines only as told by your dentist. If you were prescribed an antibiotic medicine, take it as told by your dentist. Do not stop taking it even if you start to feel better. If you were prescribed a gel that has numbing medicine in it, use it exactly as told. Do not drive or use heavy machinery (like a Conservation officer, nature) while taking prescription pain medicine. General instructions  Rinse out your mouth often with salt water. To make salt water, dissolve -1 tsp of salt in 1 cup of warm water. Eat a soft diet while your mouth is healing. Drink enough fluid to keep your urine pale yellow. Do not apply heat to the outside of your mouth. Do  not use any products that contain nicotine or tobacco. These include cigarettes and e-cigarettes. If you need help quitting, ask your doctor. Keep all follow-up visits as told by your dentist. This is important.  Prevent an abscess Brush your teeth every morning and every night. Use fluoride toothpaste. Floss your teeth each day. Get dental cleanings as often as told by your dentist. Think about getting dental sealant put on teeth that have deep holes (decay). Drink water that has fluoride in it. Most tap water has fluoride. Check the label on bottled water to see if it has fluoride in it. Drink water instead of sugary drinks. Eat healthy meals and snacks. Wear a mouth guard or face shield when you play sports. Contact a doctor if: Your pain is worse, and medicine does not help. Get help right away if: You have a fever or chills. Your symptoms suddenly get worse. You have a very bad headache. You have problems breathing or swallowing. You have trouble opening your mouth. You have swelling in your neck or close to your eye. Summary A dental abscess is an area of pus in or around a tooth. It is caused by an infection. Treatment will help with symptoms and prevent the infection from spreading. Take over-the-counter and prescription medicines only as told by your dentist. To prevent an abscess, take good care of your teeth. Brush your teeth every morning and night. Use floss every day. Get dental cleanings as often as told by your dentist. This information is not intended to replace advice given to you by your health care provider. Make sure you discuss any questions you have with your healthcare provider. Document Revised: 12/15/2019 Document Reviewed: 01/26/2020 Elsevier Patient Education  2022 [employer].    If you have been instructed to have an in-person evaluation today at a local Urgent Care facility, please use the link below. It will take you to a list of all of our  available [facility], including address, phone number and hours of operation. Please do not delay care.  [facility]  If you or a family member do not have a primary care provider, use the link below to schedule a visit and establish care. When you choose a [facility] primary care physician or advanced practice provider, you gain a long-term partner in health. Find a Primary Care Provider  Learn more about 's in-office and virtual care options: [facility]

## 2021-03-07 NOTE — Progress Notes (Signed)
Virtual Visit Consent   Jacqueline Gonzalez, you are scheduled for a virtual visit with a [facility] provider today.     Just as with appointments in the office, your consent must be obtained to participate.  Your consent will be active for this visit and any virtual visit you may have with one of our providers in the next 365 days.     If you have a MyChart account, a copy of this consent can be sent to you electronically.  All virtual visits are billed to your insurance company just like a traditional visit in the office.    As this is a virtual visit, video technology does not allow for your provider to perform a traditional examination.  This may limit your provider's ability to fully assess your condition.  If your provider identifies any concerns that need to be evaluated in person or the need to arrange testing (such as labs, EKG, etc.), we will make arrangements to do so.     Although advances in technology are sophisticated, we cannot ensure that it will always work on either your end or our end.  If the connection with a video visit is poor, the visit may have to be switched to a telephone visit.  With either a video or telephone visit, we are not always able to ensure that we have a secure connection.     I need to obtain your verbal consent now.   Are you willing to proceed with your visit today?    Keyetta Hollingworth has provided verbal consent on 03/07/2021 for a virtual visit (video or telephone).   Mar Daring, PA-C   Date: 03/07/2021 12:45 PM   Virtual Visit via Video Note   I, Mar Daring, connected with  Sharolyn Weber  ([id number], 2000/12/20) on 03/07/21 at 12:45 PM EDT by a video-enabled telemedicine application and verified that I am speaking with the correct person using two identifiers.  Location: Patient: Virtual Visit Location Patient: Home Provider: Virtual Visit Location Provider: Home Office   I discussed the limitations of evaluation and  management by telemedicine and the availability of in person appointments. The patient expressed understanding and agreed to proceed.    History of Present Illness: Jacqueline Gonzalez is a 20 y.o. who identifies as a female who was assigned female at birth, and is being seen today for dental pain.  HPI: Dental Pain  This is a new problem. The current episode started 1 to 4 weeks ago. The problem occurs constantly. The pain is at a severity of 9/10. The pain is moderate. Associated symptoms include facial pain, sinus pressure and thermal sensitivity. Pertinent negatives include no fever. Associated symptoms comments: Jaw pain, ear pain. Treatments tried: excedrin. The treatment provided moderate relief.   Has appt next Thursday for extraction  Problems:  Patient Active Problem List   Diagnosis Date Noted   Headache 09/03/2020   Nonspecific abdominal pain 08/30/2020   Ganglion cyst of dorsum of left wrist 11/03/2019   Constipation 01/21/2018   Irregular periods 01/21/2018   Generalized anxiety disorder 01/21/2018    Allergies:  Allergies  Allergen Reactions   Blue Dyes (Parenteral) Anaphylaxis   Blueberry [Vaccinium Angustifolium] Anaphylaxis   Shellfish Allergy Shortness Of Breath   Medications:  Current Outpatient Medications:    ibuprofen (ADVIL) 600 MG tablet, Take 1 tablet (600 mg total) by mouth every 8 (eight) hours as needed., Disp: 30 tablet, Rfl: 0   amitriptyline (ELAVIL) 10 MG tablet, TAKE 1  TABLET BY MOUTH EVERYDAY AT BEDTIME, Disp: 30 tablet, Rfl: 0   amoxicillin (AMOXIL) 500 MG capsule, Take 1 capsule (500 mg total) by mouth 3 (three) times daily for 10 days., Disp: 30 capsule, Rfl: 0   cetirizine (ZYRTEC) 10 MG tablet, Take 1 tablet (10 mg total) by mouth daily., Disp: 30 tablet, Rfl: 11   Lactobacillus TABS, Take 1 tablet by mouth daily., Disp: 30 tablet, Rfl: 1   naproxen (NAPROSYN) 500 MG tablet, Take 1 tablet (500 mg total) by mouth 2 (two) times daily., Disp: 30  tablet, Rfl: 0   ondansetron (ZOFRAN) 8 MG tablet, Take 1 tablet (8 mg total) by mouth every 8 (eight) hours as needed for nausea or vomiting. (Patient not taking: Reported on 02/15/2020), Disp: 20 tablet, Rfl: 0   polyethylene glycol powder (GLYCOLAX/MIRALAX) 17 GM/SCOOP powder, Take 17 g by mouth 2 (two) times daily as needed for mild constipation or moderate constipation., Disp: 3350 g, Rfl: 1   psyllium (METAMUCIL SMOOTH TEXTURE) 58.6 % powder, Take 1 packet by mouth daily., Disp: 283 g, Rfl: 2   SUMAtriptan (IMITREX) 50 MG tablet, Take 1 tablet (50 mg total) by mouth daily as needed for migraine. May repeat in 2 hours if headache persists or recurs., Disp: 20 tablet, Rfl: 0  Observations/Objective: Patient is well-developed, well-nourished in no acute distress.  Resting comfortably at home.  Head is normocephalic, atraumatic.  No labored breathing.  Speech is clear and coherent with logical content.  Patient is alert and oriented at baseline.    Assessment and Plan: 1. Tooth abscess - amoxicillin (AMOXIL) 500 MG capsule; Take 1 capsule (500 mg total) by mouth 3 (three) times daily for 10 days.  Dispense: 30 capsule; Refill: 0 - ibuprofen (ADVIL) 600 MG tablet; Take 1 tablet (600 mg total) by mouth every 8 (eight) hours as needed.  Dispense: 30 tablet; Refill: 0 - Worsening swelling and pain over the last day - Will treat with amoxicillin for infection - Ibuprofen for nighttime pain - May continue Excedrin during the day since that is helping - Salt water gargles (allow to come to room temperature after dissolving salt) - Ice packs x 15 min every hour as needed for pain and swelling - Oragel OTC as needed - Keep appt with dentist next week  Follow Up Instructions: I discussed the assessment and treatment plan with the patient. The patient was provided an opportunity to ask questions and all were answered. The patient agreed with the plan and demonstrated an understanding of the  instructions.  A copy of instructions were sent to the patient via MyChart.  The patient was advised to call back or seek an in-person evaluation if the symptoms worsen or if the condition fails to improve as anticipated.  Time:  I spent 10 minutes with the patient via telehealth technology discussing the above problems/concerns.    Mar Daring, PA-C

## 2021-03-18 HISTORY — PX: WISDOM TOOTH EXTRACTION: SHX21

## 2021-03-25 ENCOUNTER — Telehealth: Payer: Medicaid Other | Admitting: Physician Assistant

## 2021-03-25 DIAGNOSIS — J069 Acute upper respiratory infection, unspecified: Secondary | ICD-10-CM | POA: Diagnosis not present

## 2021-03-25 MED ORDER — FLUTICASONE PROPIONATE 50 MCG/ACT NA SUSP: 2.0000 | Freq: Every day | NASAL | 0 refills | Status: DC

## 2021-03-25 MED ORDER — BENZONATATE 100 MG PO CAPS: 100.0000 mg | ORAL_CAPSULE | Freq: Three times a day (TID) | ORAL | 0 refills | Status: DC | PRN

## 2021-03-25 NOTE — Progress Notes (Signed)
I have spent 5 minutes in review of e-visit questionnaire, review and updating patient chart, medical decision making and response to patient.   Cesar Alf Cody Ronin Rehfeldt, PA-C    

## 2021-03-25 NOTE — Progress Notes (Signed)
E-Visit for Upper Respiratory Infection  ° °We are sorry you are not feeling well.  Here is how we plan to help! ° °Based on what you have shared with me, it looks like you may have a viral upper respiratory infection.  Upper respiratory infections are caused by a large number of viruses; however, rhinovirus is the most common cause.  ° °Symptoms vary from person to person, with common symptoms including sore throat, cough, fatigue or lack of energy and feeling of general discomfort.  A low-grade fever of up to 100.4 may present, but is often uncommon.  Symptoms vary however, and are closely related to a person's age or underlying illnesses.  The most common symptoms associated with an upper respiratory infection are nasal discharge or congestion, cough, sneezing, headache and pressure in the ears and face.  These symptoms usually persist for about 3 to 10 days, but can last up to 2 weeks.  It is important to know that upper respiratory infections do not cause serious illness or complications in most cases.   ° °Upper respiratory infections can be transmitted from person to person, with the most common method of transmission being a person's hands.  The virus is able to live on the skin and can infect other persons for up to 2 hours after direct contact.  Also, these can be transmitted when someone coughs or sneezes; thus, it is important to cover the mouth to reduce this risk.  To keep the spread of the illness at bay, good hand hygiene is very important. ° °This is an infection that is most likely caused by a virus. There are no specific treatments other than to help you with the symptoms until the infection runs its course.  We are sorry you are not feeling well.  Here is how we plan to help! ° ° °For nasal congestion, you may use an oral decongestants such as Mucinex D or if you have glaucoma or high blood pressure use plain Mucinex.  Saline nasal spray or nasal drops can help and can safely be used as often as  needed for congestion.  For your congestion, I have prescribed Fluticasone nasal spray one spray in each nostril twice a day ° °If you do not have a history of heart disease, hypertension, diabetes or thyroid disease, prostate/bladder issues or glaucoma, you may also use Sudafed to treat nasal congestion.  It is highly recommended that you consult with a pharmacist or your primary care physician to ensure this medication is safe for you to take.    ° °If you have a cough, you may use cough suppressants such as Delsym and Robitussin.  If you have glaucoma or high blood pressure, you can also use Coricidin HBP.   °For cough I have prescribed for you A prescription cough medication called Tessalon Perles 100 mg. You may take 1-2 capsules every 8 hours as needed for cough ° °If you have a sore or scratchy throat, use a saltwater gargle- ¼ to ½ teaspoon of salt dissolved in a 4-ounce to 8-ounce glass of warm water.  Gargle the solution for approximately 15-30 seconds and then spit.  It is important not to swallow the solution.  You can also use throat lozenges/cough drops and Chloraseptic spray to help with throat pain or discomfort.  Warm or cold liquids can also be helpful in relieving throat pain. ° °For headache, pain or general discomfort, you can use Ibuprofen or Tylenol as directed.   °Some authorities believe   that zinc sprays or the use of Echinacea may shorten the course of your symptoms. ° ° °HOME CARE °Only take medications as instructed by your medical team. °Be sure to drink plenty of fluids. Water is fine as well as fruit juices, sodas and electrolyte beverages. You may want to stay away from caffeine or alcohol. If you are nauseated, try taking small sips of liquids. How do you know if you are getting enough fluid? Your urine should be a pale yellow or almost colorless. °Get rest. °Taking a steamy shower or using a humidifier may help nasal congestion and ease sore throat pain. You can place a towel over  your head and breathe in the steam from hot water coming from a faucet. °Using a saline nasal spray works much the same way. °Cough drops, hard candies and sore throat lozenges may ease your cough. °Avoid close contacts especially the very young and the elderly °Cover your mouth if you cough or sneeze °Always remember to wash your hands.  ° °GET HELP RIGHT AWAY IF: °You develop worsening fever. °If your symptoms do not improve within 10 days °You develop yellow or green discharge from your nose over 3 days. °You have coughing fits °You develop a severe head ache or visual changes. °You develop shortness of breath, difficulty breathing or start having chest pain °Your symptoms persist after you have completed your treatment plan ° °MAKE SURE YOU  °Understand these instructions. °Will watch your condition. °Will get help right away if you are not doing well or get worse. ° °Thank you for choosing an e-visit. ° °Your e-visit answers were reviewed by a board certified advanced clinical practitioner to complete your personal care plan. Depending upon the condition, your plan could have included both over the counter or prescription medications. ° °Please review your pharmacy choice. Make sure the pharmacy is open so you can pick up prescription now. If there is a problem, you may contact your provider through MyChart messaging and have the prescription routed to another pharmacy.  Your safety is important to us. If you have drug allergies check your prescription carefully.  ° °For the next 24 hours you can use MyChart to ask questions about today's visit, request a non-urgent call back, or ask for a work or school excuse. °You will get an email in the next two days asking about your experience. I hope that your e-visit has been valuable and will speed your recovery. ° ° ° ° °

## 2021-04-17 ENCOUNTER — Other Ambulatory Visit: Payer: Self-pay

## 2021-04-17 ENCOUNTER — Encounter (HOSPITAL_BASED_OUTPATIENT_CLINIC_OR_DEPARTMENT_OTHER): Payer: Self-pay | Admitting: Orthopedic Surgery

## 2021-04-17 NOTE — Progress Notes (Signed)
Spoke w/ via phone for pre-op interview--- pt Lab needs dos----  urine preg (per anes);  pre-op orders pending             Lab results------ no COVID test -----patient states asymptomatic no test needed Arrive at ------- 0845 on 04-24-2021 NPO after MN NO Solid Food.  Clear liquids from MN until--- 0745 Med rec completed Medications to take morning of surgery ----- none Diabetic medication ----- n/a Patient instructed no nail polish to be worn day of surgery Patient instructed to bring photo id and insurance card day of surgery Patient aware to have Driver (ride ) / caregiver  for 24 hours after surgery --mother, Environmental consultant Patient Special Instructions ----- n/a Pre-Op special Istructions ----- called left message for dr spears OR scheduler, requested orders Patient verbalized understanding of instructions that were given at this phone interview. Patient denies shortness of breath, chest pain, fever, cough at this phone interview.

## 2021-04-19 NOTE — H&P (Signed)
Preoperative History & Physical Exam  Surgeon: Matt Holmes, MD  Diagnosis: left recurrent dorsal carpal ganglion  Planned Procedure: Procedure(s) (LRB): left recurrent dorsal carpal ganglion excision (Left)  History of Present Illness:   Patient is a 20 y.o. female with symptoms consistent with  left recurrent dorsal carpal ganglion who presents for surgical intervention. The risks, benefits and alternatives of surgical intervention were discussed and informed consent was obtained prior to surgery.  Past Medical History:  Past Medical History:  Diagnosis Date   Constipation, chronic    Environmental allergies    Ganglion cyst of dorsum of left wrist    RECURRENT   GERD (gastroesophageal reflux disease)    watches diet   Headache    IBS (irritable bowel syndrome)    Lactose intolerance     Past Surgical History:  Past Surgical History:  Procedure Laterality Date   TONSILLECTOMY AND ADENOIDECTOMY     AGE 20   WISDOM TOOTH EXTRACTION  03/18/2021   WRIST GANGLION EXCISION Left 02/2020    Medications:  Prior to Admission medications   Medication Sig Start Date End Date Taking? Authorizing Provider  cetirizine (ZYRTEC) 10 MG tablet Take 1 tablet (10 mg total) by mouth daily. Patient taking differently: Take 10 mg by mouth daily as needed. 11/22/18  Yes Abraham, Sherin, DO  fluticasone (FLONASE) 50 MCG/ACT nasal spray Place 2 sprays into both nostrils daily. Patient taking differently: Place 2 sprays into both nostrils daily as needed. 03/25/21  Yes Brunetta Jeans, PA-C  polyethylene glycol powder (GLYCOLAX/MIRALAX) 17 GM/SCOOP powder Take 17 g by mouth 2 (two) times daily as needed for mild constipation or moderate constipation. 11/03/19  Yes Enid Derry, [place], DO  psyllium (METAMUCIL SMOOTH TEXTURE) 58.6 % powder Take 1 packet by mouth daily. Patient taking differently: Take 1 packet by mouth every other day. 08/29/20  Yes [name], MD  SUMAtriptan (IMITREX) 50 MG  tablet Take 1 tablet (50 mg total) by mouth daily as needed for migraine. May repeat in 2 hours if headache persists or recurs. 08/29/20  Yes [name], MD  amitriptyline (ELAVIL) 10 MG tablet TAKE 1 TABLET BY MOUTH EVERYDAY AT BEDTIME Patient not taking: Reported on 04/17/2021 09/26/20   Ganta, Anupa, DO  benzonatate (TESSALON) 100 MG capsule Take 1 capsule (100 mg total) by mouth 3 (three) times daily as needed for cough. Patient not taking: Reported on 04/17/2021 03/25/21   Brunetta Jeans, PA-C  ibuprofen (ADVIL) 600 MG tablet Take 1 tablet (600 mg total) by mouth every 8 (eight) hours as needed. Patient not taking: Reported on 04/17/2021 03/07/21   Mar Daring, PA-C    Allergies:  Blue dyes (parenteral), Blueberry [vaccinium angustifolium], Raspberry, and Shellfish allergy  Review of Systems: Negative except per HPI.  Physical Exam: Alert and oriented, NAD Head and neck: no masses, normal alignment CV: pulse intact Pulm: no increased work of breathing, respirations even and unlabored Abdomen: non-distended Extremities: extremities warm and well perfused  LABS: No results found for this or any previous visit (from the past 2160 hour(s)).   Complete History and Physical exam available in the office notes  [name]

## 2021-04-23 NOTE — Progress Notes (Signed)
Spoke with patient, confirmed 0745 arrival time for day of surgery.

## 2021-04-23 NOTE — Anesthesia Preprocedure Evaluation (Addendum)
Anesthesia Evaluation  Patient identified by MRN, date of birth, ID band Patient awake    Reviewed: Allergy & Precautions, NPO status , Patient's Chart, lab work & pertinent test results  History of Anesthesia Complications Negative for: history of anesthetic complications  Airway Mallampati: II  TM Distance: >3 FB Neck ROM: Full    Dental no notable dental hx.    Pulmonary neg pulmonary ROS,    Pulmonary exam normal        Cardiovascular negative cardio ROS Normal cardiovascular exam     Neuro/Psych  Headaches, Anxiety    GI/Hepatic Neg liver ROS, GERD  Controlled,  Endo/Other  negative endocrine ROS  Renal/GU negative Renal ROS  negative genitourinary   Musculoskeletal left recurrent dorsal carpal ganglion   Abdominal   Peds  Hematology negative hematology ROS (+)   Anesthesia Other Findings Day of surgery medications reviewed with patient.  Reproductive/Obstetrics negative OB ROS                            Anesthesia Physical Anesthesia Plan  ASA: 2  Anesthesia Plan: MAC and Regional   Post-op Pain Management:    Induction:   PONV Risk Score and Plan: 2 and Treatment may vary due to age or medical condition, Propofol infusion and Midazolam  Airway Management Planned: Natural Airway and Simple Face Mask  Additional Equipment: None  Intra-op Plan:   Post-operative Plan:   Informed Consent: I have reviewed the patients History and Physical, chart, labs and discussed the procedure including the risks, benefits and alternatives for the proposed anesthesia with the patient or authorized representative who has indicated his/her understanding and acceptance.     Dental advisory given  Plan Discussed with: CRNA  Anesthesia Plan Comments:        Anesthesia Quick Evaluation

## 2021-04-24 ENCOUNTER — Ambulatory Visit (HOSPITAL_BASED_OUTPATIENT_CLINIC_OR_DEPARTMENT_OTHER): Payer: Medicaid Other | Admitting: Anesthesiology

## 2021-04-24 ENCOUNTER — Encounter (HOSPITAL_BASED_OUTPATIENT_CLINIC_OR_DEPARTMENT_OTHER): Payer: Self-pay | Admitting: Orthopedic Surgery

## 2021-04-24 ENCOUNTER — Ambulatory Visit (HOSPITAL_BASED_OUTPATIENT_CLINIC_OR_DEPARTMENT_OTHER)
Admission: RE | Admit: 2021-04-24 | Discharge: 2021-04-24 | Disposition: A | Discharge status: Home / Self Care | Payer: Medicaid Other | Attending: Orthopedic Surgery | Admitting: Orthopedic Surgery

## 2021-04-24 ENCOUNTER — Encounter (HOSPITAL_BASED_OUTPATIENT_CLINIC_OR_DEPARTMENT_OTHER)
Admission: RE | Disposition: A | Discharge status: Home / Self Care | Payer: Self-pay | Source: Home / Self Care | Attending: Orthopedic Surgery

## 2021-04-24 DIAGNOSIS — M67432 Ganglion, left wrist: Secondary | ICD-10-CM | POA: Insufficient documentation

## 2021-04-24 HISTORY — DX: Gastro-esophageal reflux disease without esophagitis: K21.9

## 2021-04-24 HISTORY — DX: Irritable bowel syndrome, unspecified: K58.9

## 2021-04-24 HISTORY — DX: Other constipation: K59.09

## 2021-04-24 HISTORY — DX: Headache, unspecified: R51.9

## 2021-04-24 HISTORY — PX: GANGLION CYST EXCISION: SHX1691

## 2021-04-24 HISTORY — DX: Ganglion, left wrist: M67.432

## 2021-04-24 LAB — POCT PREGNANCY, URINE: Preg Test, Ur: NEGATIVE

## 2021-04-24 SURGERY — EXCISION, GANGLION CYST, WRIST
Anesthesia: Monitor Anesthesia Care | Site: Wrist | Laterality: Left

## 2021-04-24 MED ORDER — LIDOCAINE 2% (20 MG/ML) 5 ML SYRINGE
INTRAMUSCULAR | Status: AC
  Filled 2021-04-24: qty 5

## 2021-04-24 MED ORDER — PROPOFOL 500 MG/50ML IV EMUL
INTRAVENOUS | Status: DC | PRN
  Administered 2021-04-24: 100 ug/kg/min via INTRAVENOUS

## 2021-04-24 MED ORDER — FENTANYL CITRATE (PF) 100 MCG/2ML IJ SOLN
50.0000 ug | Freq: Once | INTRAMUSCULAR | Status: AC
  Administered 2021-04-24: 50 ug via INTRAVENOUS

## 2021-04-24 MED ORDER — MIDAZOLAM HCL 2 MG/2ML IJ SOLN
INTRAMUSCULAR | Status: AC
  Filled 2021-04-24: qty 2

## 2021-04-24 MED ORDER — ACETAMINOPHEN 500 MG PO TABS
ORAL_TABLET | ORAL | Status: AC
  Filled 2021-04-24: qty 2

## 2021-04-24 MED ORDER — MIDAZOLAM HCL 2 MG/2ML IJ SOLN
2.0000 mg | Freq: Once | INTRAMUSCULAR | Status: AC
  Administered 2021-04-24: 2 mg via INTRAVENOUS

## 2021-04-24 MED ORDER — FENTANYL CITRATE (PF) 100 MCG/2ML IJ SOLN
INTRAMUSCULAR | Status: AC
  Filled 2021-04-24: qty 2

## 2021-04-24 MED ORDER — LACTATED RINGERS IV SOLN: INTRAVENOUS | Status: DC

## 2021-04-24 MED ORDER — LIDOCAINE 2% (20 MG/ML) 5 ML SYRINGE
INTRAMUSCULAR | Status: DC | PRN
  Administered 2021-04-24: 100 mg via INTRAVENOUS

## 2021-04-24 MED ORDER — 0.9 % SODIUM CHLORIDE (POUR BTL) OPTIME
TOPICAL | Status: DC | PRN
  Administered 2021-04-24: 500 mL

## 2021-04-24 MED ORDER — MIDAZOLAM HCL 5 MG/5ML IJ SOLN
INTRAMUSCULAR | Status: DC | PRN
Start: 2021-04-24 — End: 2021-04-24
  Administered 2021-04-24: 2 mg via INTRAVENOUS

## 2021-04-24 MED ORDER — PROPOFOL 500 MG/50ML IV EMUL
INTRAVENOUS | Status: AC
  Filled 2021-04-24: qty 50

## 2021-04-24 MED ORDER — CEFAZOLIN SODIUM-DEXTROSE 2-4 GM/100ML-% IV SOLN
INTRAVENOUS | Status: AC
  Filled 2021-04-24: qty 100

## 2021-04-24 MED ORDER — CEFAZOLIN SODIUM-DEXTROSE 2-4 GM/100ML-% IV SOLN
2.0000 g | INTRAVENOUS | Status: AC
  Administered 2021-04-24: 2 g via INTRAVENOUS

## 2021-04-24 MED ORDER — ACETAMINOPHEN 500 MG PO TABS
1000.0000 mg | ORAL_TABLET | Freq: Once | ORAL | Status: AC
  Administered 2021-04-24: 1000 mg via ORAL

## 2021-04-24 MED ORDER — BUPIVACAINE-EPINEPHRINE (PF) 0.5% -1:200000 IJ SOLN
INTRAMUSCULAR | Status: DC | PRN
  Administered 2021-04-24: 30 mL via PERINEURAL

## 2021-04-24 SURGICAL SUPPLY — 30 items
BLADE SURG 15 STRL LF DISP TIS (BLADE) ×1 IMPLANT
BLADE SURG 15 STRL SS (BLADE) ×2
BNDG CMPR 9X4 STRL LF SNTH (GAUZE/BANDAGES/DRESSINGS) ×1
BNDG ELASTIC 3X5.8 VLCR STR LF (GAUZE/BANDAGES/DRESSINGS) ×2 IMPLANT
BNDG ESMARK 4X9 LF (GAUZE/BANDAGES/DRESSINGS) ×2 IMPLANT
CORD BIPOLAR FORCEPS 12FT (ELECTRODE) ×2 IMPLANT
COVER BACK TABLE 60X90IN (DRAPES) ×2 IMPLANT
DRAPE EXTREMITY T 121X128X90 (DISPOSABLE) ×2 IMPLANT
DRAPE SHEET LG 3/4 BI-LAMINATE (DRAPES) ×2 IMPLANT
GAUZE 4X4 16PLY ~~LOC~~+RFID DBL (SPONGE) ×2 IMPLANT
GAUZE SPONGE 4X4 12PLY STRL (GAUZE/BANDAGES/DRESSINGS) ×2 IMPLANT
GAUZE XEROFORM 1X8 LF (GAUZE/BANDAGES/DRESSINGS) ×2 IMPLANT
GLOVE SURG ENC MOIS LTX SZ7.5 (GLOVE) ×2 IMPLANT
GLOVE SURG POLYISO LF SZ7 (GLOVE) ×4 IMPLANT
GOWN STRL REUS W/ TWL LRG LVL3 (GOWN DISPOSABLE) ×1 IMPLANT
GOWN STRL REUS W/TWL LRG LVL3 (GOWN DISPOSABLE) ×4 IMPLANT
HIBICLENS CHG 4% 4OZ (MISCELLANEOUS) ×2 IMPLANT
KIT TURNOVER CYSTO (KITS) ×2 IMPLANT
NEEDLE HYPO 25X1 1.5 SAFETY (NEEDLE) ×2 IMPLANT
NS IRRIG 500ML POUR BTL (IV SOLUTION) ×2 IMPLANT
PACK BASIN DAY SURGERY FS (CUSTOM PROCEDURE TRAY) ×2 IMPLANT
PAD CAST 4YDX4 CTTN HI CHSV (CAST SUPPLIES) ×1 IMPLANT
PADDING CAST COTTON 4X4 STRL (CAST SUPPLIES) ×2
STOCKINETTE 4X48 STRL (DRAPES) ×2 IMPLANT
SUT ETHILON 4 0 PS 2 18 (SUTURE) ×2 IMPLANT
SYR BULB EAR ULCER 3OZ GRN STR (SYRINGE) ×2 IMPLANT
Shoulder Immob XL ×2 IMPLANT
TOWEL OR 17X26 10 PK STRL BLUE (TOWEL DISPOSABLE) ×2 IMPLANT
TRAY DSU PREP LF (CUSTOM PROCEDURE TRAY) ×2 IMPLANT
UNDERPAD 30X36 HEAVY ABSORB (UNDERPADS AND DIAPERS) ×2 IMPLANT

## 2021-04-24 NOTE — Anesthesia Postprocedure Evaluation (Signed)
Anesthesia Post Note  Patient: Jacqueline Gonzalez  Procedure(s) Performed: left recurrent dorsal carpal ganglion excision (Left: Wrist)     Patient location during evaluation: PACU Anesthesia Type: Regional Level of consciousness: awake and alert and oriented Pain management: pain level controlled Vital Signs Assessment: post-procedure vital signs reviewed and stable Respiratory status: spontaneous breathing, nonlabored ventilation and respiratory function stable Cardiovascular status: blood pressure returned to baseline Postop Assessment: no apparent nausea or vomiting Anesthetic complications: no   No notable events documented.  Last Vitals:  Vitals:   04/24/21 0915 04/24/21 0920  BP: 125/76 128/74  Pulse: 100 94  Resp: 20 19  Temp:    SpO2: 98% 100%    Last Pain:  Vitals:   04/24/21 0818  TempSrc: Oral  PainSc: 0-No pain                 Marthenia Rolling

## 2021-04-24 NOTE — Anesthesia Procedure Notes (Signed)
Procedure Name: MAC Date/Time: 04/24/2021 9:45 AM Performed by: Bonney Aid, CRNA Pre-anesthesia Checklist: Patient identified, Emergency Drugs available, Suction available, Patient being monitored and Timeout performed Patient Re-evaluated:Patient Re-evaluated prior to induction Oxygen Delivery Method: Nasal cannula Placement Confirmation: positive ETCO2

## 2021-04-24 NOTE — Discharge Instructions (Addendum)
Orthopaedic Hand Surgery Discharge Instructions  WEIGHT BEARING STATUS: Non weight bearing on operative extremity  DRESSINGS: Please keep your dressing/splint/cast clean and dry until your follow-up appointment. You may shower by placing a waterproof covering over your dressing/splint/cast. Contact your surgeon if your splint/cast gets wet. It will need to be changed to prevent skin breakdown.  PAIN CONTROL: First line medications for post operative pain control are Tylenol (acetaminophen) and Motrin (ibuprofen) if you are able to take these medications. If you have been prescribed a medication these can be taken as breakthrough pain medications. Please note that some narcotic pain medication have acetaminophen added and you should never consume more than 4,064m of acetaminophen in 24 hour period. Also please note that if you are given Toradol (ketoralac) you should not take similar medications simultaneously such as ibuprofen.   ICE/ELEVATION: Ice and elevate your injured extremity as needed. Avoid direct contact of ice with skin.  HOME MEDICATIONS: No changes have been made to your home medications.  FOLLOW UP: You will be called after surgery with an appointment date and time, however if you have not received a phone call within 3 days please call during regular office hours at [phone] schedule a post operative appointment.  Please Seek Medical Attention if: Call MD for: pain or pressure in chest, jaw, arm, back, neck  Call MD for: temperature greater than 101 F for more than 24 hours  Call MD for: difficulty breathing Call MD for: Incision redness, bleeding, drainage  Call MD for: palpitations or feeling that the heart is racing  Call MD for: increased swelling in arm, leg, ankle, or abdomen  Call MD for: lightheadedness, dizziness, fainting Go to ED or call 911 if: chest pain does not go away after 3 nitroglycerin doses taken 5 min apart  Go to ED or call 911 for: any  uncontrolled bleeding  Go to ED or call 911 if: unable to reach physician  Discharge Medications: Hydrocodone #5 tabs sent to pharmacy via ADoree Barthel  JMatt Holmes MD Orthopaedic Hand Surgery    Post Anesthesia Home Care Instructions  Activity: Get plenty of rest for the remainder of the day. A responsible individual must stay with you for 24 hours following the procedure.  For the next 24 hours, DO NOT: -Drive a car -OPaediatric nurse-Drink alcoholic beverages -Take any medication unless instructed by your physician -Make any legal decisions or sign important papers.  Meals: Start with liquid foods such as gelatin or soup. Progress to regular foods as tolerated. Avoid greasy, spicy, heavy foods. If nausea and/or vomiting occur, drink only clear liquids until the nausea and/or vomiting subsides. Call your physician if vomiting continues.  Special Instructions/Symptoms: Your throat may feel dry or sore from the anesthesia or the breathing tube placed in your throat during surgery. If this causes discomfort, gargle with warm salt water. The discomfort should disappear within 24 hours.

## 2021-04-24 NOTE — Interval H&P Note (Signed)
History and Physical Interval Note:  04/24/2021 8:27 AM  Jacqueline Gonzalez  has presented today for surgery, with the diagnosis of left recurrent dorsal carpal ganglion.  The various methods of treatment have been discussed with the patient and family. After consideration of risks, benefits and other options for treatment, the patient has consented to  Procedure(s) with comments: left recurrent dorsal carpal ganglion excision (Left) - with MAC anesthesia needs 30 minutes as a surgical intervention.  The patient's history has been reviewed, patient examined, no change in status, stable for surgery.  I have reviewed the patient's chart and labs.  Questions were answered to the patient's satisfaction.     [name]

## 2021-04-24 NOTE — Transfer of Care (Signed)
Immediate Anesthesia Transfer of Care Note  Patient: Jacqueline Gonzalez  Procedure(s) Performed: left recurrent dorsal carpal ganglion excision (Left: Wrist)  Patient Location: PACU and Short Stay  Anesthesia Type:MAC  Level of Consciousness: awake, alert  and oriented  Airway & Oxygen Therapy: Patient Spontanous Breathing  Post-op Assessment: Report given to RN  Post vital signs: Reviewed and stable  Last Vitals:  Vitals Value Taken Time  BP 124/83   Temp    Pulse 94   Resp    SpO2 98%     Last Pain:  Vitals:   04/24/21 0818  TempSrc: Oral  PainSc: 0-No pain      Patients Stated Pain Goal: 5 (72/90/21 1155)  Complications: No notable events documented.

## 2021-04-24 NOTE — Op Note (Signed)
OPERATIVE NOTE  DATE OF PROCEDURE: 04/24/2021  SURGEONS:  Primary: [name], MD  PREOPERATIVE DIAGNOSIS: left recurrent dorsal carpal ganglion  POSTOPERATIVE DIAGNOSIS: Same  NAME OF PROCEDURE:   Left recurrent dorsal carpal ganglion cyst excision, revision  ANESTHESIA: Monitor Anesthesia Care + Block  SKIN PREPARATION: Hibiclens  ESTIMATED BLOOD LOSS: Minimal  IMPLANTS: none  INDICATIONS:  Jacqueline Gonzalez is a 20 y.o. female who has the above preoperative diagnosis. The patient has decided to proceed with surgical intervention.  Risks, benefits and alternatives of operative management were discussed including, but not limited to, risks of anesthesia complications, infection, pain, persistent symptoms, stiffness, need for future surgery.  The patient understands, agrees and elects to proceed with surgery.    DESCRIPTION OF PROCEDURE: The patient was met in the pre-operative area and their identity was verified.  The operative location and laterality was also verified and marked.  The patient was brought to the OR and was placed supine on the table.  After repeat patient identification with the operative team anesthesia was provided and the patient was prepped and draped in the usual sterile fashion.  A final timeout was performed verifying the correction patient, procedure, location and laterality.  The surgery began by IV administration of preoperative antibiotics followed by elevation of the left upper extremity and exsanguination with an Esmarch and tourniquet inflation to 250 mmHg.  A longitudinal incision was made over the recurrent dorsal carpal ganglion cyst to the left wrist.  Skin and subcutaneous tissues were divided.  Meticulous hemostasis was obtained.  The second and fourth dorsal extensor compartments were identified and protected and the cyst was excised as well as a portion of the dorsal capsule.  The wound was thoroughly irrigated.  Hemostasis obtained with bipolar  electrocautery.  The wound was then closed with 4-0 nylon horizontal mattress sutures.  A sterile soft dressing was applied.  The tourniquet was deflated and the fingers were pink and warm and well-perfused.  The patient tolerated the procedure well and was brought to the recovery room in stable condition.  At the end of the case all counts were correct x2.   Matt Holmes, MD

## 2021-04-24 NOTE — Progress Notes (Signed)
Assisted Dr. Daiva Huge with left, ultrasound guided, supraclavicular block. Side rails up, monitors on throughout procedure. See vital signs in flow sheet. Tolerated Procedure well.

## 2021-04-24 NOTE — Anesthesia Procedure Notes (Signed)
Anesthesia Regional Block: Supraclavicular block   Pre-Anesthetic Checklist: , timeout performed,  Correct Patient, Correct Site, Correct Laterality,  Correct Procedure, Correct Position, site marked,  Risks and benefits discussed,  Pre-op evaluation,  At surgeon's request and post-op pain management  Laterality: Left  Prep: Maximum Sterile Barrier Precautions used, chloraprep       Needles:  Injection technique: Single-shot  Needle Type: Echogenic Stimulator Needle     Needle Length: 9cm  Needle Gauge: 22     Additional Needles:   Procedures:,,,, ultrasound used (permanent image in chart),,    Narrative:  Start time: 04/24/2021 9:06 AM End time: 04/24/2021 9:09 AM Injection made incrementally with aspirations every 5 mL.  Performed by: Personally  Anesthesiologist: Brennan Bailey, MD  Additional Notes: Risks, benefits, and alternative discussed. Patient gave consent for procedure. Patient prepped and draped in sterile fashion. Sedation administered, patient remains easily responsive to voice. Relevant anatomy identified with ultrasound guidance. Local anesthetic given in 5cc increments with no signs or symptoms of intravascular injection. No pain or paraesthesias with injection. Patient monitored throughout procedure with signs of LAST or immediate complications. Tolerated well. Ultrasound image placed in chart.  Tawny Asal, MD

## 2021-04-25 LAB — SURGICAL PATHOLOGY

## 2021-04-28 ENCOUNTER — Encounter (HOSPITAL_BASED_OUTPATIENT_CLINIC_OR_DEPARTMENT_OTHER): Payer: Self-pay | Admitting: Orthopedic Surgery

## 2021-05-02 ENCOUNTER — Ambulatory Visit (INDEPENDENT_AMBULATORY_CARE_PROVIDER_SITE_OTHER): Payer: Medicaid Other | Admitting: Family Medicine

## 2021-05-02 ENCOUNTER — Encounter: Payer: Self-pay | Admitting: Family Medicine

## 2021-05-02 ENCOUNTER — Ambulatory Visit (HOSPITAL_COMMUNITY)
Admission: RE | Admit: 2021-05-02 | Discharge: 2021-05-02 | Disposition: A | Discharge status: Home / Self Care | Payer: Medicaid Other | Source: Ambulatory Visit | Attending: Family Medicine | Admitting: Family Medicine

## 2021-05-02 ENCOUNTER — Other Ambulatory Visit: Payer: Self-pay

## 2021-05-02 VITALS — BP 125/81 | HR 85 | Ht 69.0 in | Wt 241.4 lb

## 2021-05-02 DIAGNOSIS — M545 Low back pain, unspecified: Secondary | ICD-10-CM

## 2021-05-02 DIAGNOSIS — F411 Generalized anxiety disorder: Secondary | ICD-10-CM

## 2021-05-02 DIAGNOSIS — K047 Periapical abscess without sinus: Secondary | ICD-10-CM

## 2021-05-02 MED ORDER — DULOXETINE HCL 30 MG PO CPEP: 30.0000 mg | ORAL_CAPSULE | Freq: Every day | ORAL | 1 refills | Status: DC

## 2021-05-02 MED ORDER — CYCLOBENZAPRINE HCL 5 MG PO TABS: 5.0000 mg | ORAL_TABLET | Freq: Two times a day (BID) | ORAL | 0 refills | Status: DC | PRN

## 2021-05-02 MED ORDER — IBUPROFEN 600 MG PO TABS: 600.0000 mg | ORAL_TABLET | Freq: Three times a day (TID) | ORAL | 0 refills | Status: DC | PRN

## 2021-05-02 NOTE — Assessment & Plan Note (Signed)
Severe GAD based on her GAD score. I discussed initiating Cymbalta which may also help with her back pain. She with this and I started her on Cymbalta 30 mg QD. She is also interested in CCM referral to connect her with a Psychologist for counseling. CCM referral placed. F/U in 4 weeks for reassessment.

## 2021-05-02 NOTE — Progress Notes (Signed)
    SUBJECTIVE:   CHIEF COMPLAINT / HPI:   Back Pain This is a chronic problem. The current episode started more than 1 year ago (Lower back pain x 2 years). The problem has been waxing and waning since onset. The quality of the pain is described as aching. The pain does not radiate. The pain is at a severity of 8/10. The pain is moderate. The symptoms are aggravated by position (Walking or not wearing bra). Pertinent negatives include no bladder incontinence, bowel incontinence, fever, numbness, paresthesias, tingling or weakness. (She has IBS ensures has constipations and diarrhea alternating.) Risk factors: No trauma to the back. She has tried heat (OTC pain meds cream) for the symptoms. The treatment provided mild relief.  She also endorses intermittent right side sharp pain on and off for 6 months. This occurs with certain movement and positioning. She denies right side pain currently. Denies urinary symptoms. LMP 04/17/21. She is not on birth control and is not sexually active.  GAD: at the end of the visit, she reports symptoms of anxiety without depression and requested referral for counseling.  PERTINENT  PMH / PSH: PMX reviewed  OBJECTIVE:   BP 125/81   Pulse 85   Ht 5' 9"  (1.753 m)   Wt 241 lb 6.4 oz (109.5 kg)   LMP 04/17/2021 (Exact Date)   SpO2 99%   BMI 35.65 kg/m   Physical Exam Vitals and nursing note reviewed.  Cardiovascular:     Rate and Rhythm: Normal rate and regular rhythm.     Heart sounds: Normal heart sounds. No murmur heard. Pulmonary:     Effort: Pulmonary effort is normal. No respiratory distress.     Breath sounds: Normal breath sounds. No wheezing.  Musculoskeletal:     Lumbar back: Tenderness present. No swelling or deformity.     Comments: ++ mild paraspinal and lumbar spine tenderness with palpation. Neg CVT B/L.    GAD 7 : Generalized Anxiety Score 05/02/2021  Nervous, Anxious, on Edge 2  Control/stop worrying 2  Worry too much - different  things 3  Trouble relaxing 1  Restless 2  Easily annoyed or irritable 3  Afraid - awful might happen 3  Total GAD 7 Score 16  Anxiety Difficulty Very difficult    [facility] Office Visit from 05/02/2021 in [facility]  PHQ-9 Total Score 12       ASSESSMENT/PLAN:   Low back pain Etiology unclear. No neurologic deficit. ?? Due to overuse or her breast weight on her back since it is aggravated by not wearing a firm bra. Xray ordered to evaluate further. I gave her home back exercise and escribed Flexeril and Ibuprofen prn. I advised her not to use flexeril when driving or operating machinery. F/U as needed. She verbalized understanding.  Generalized anxiety disorder Severe GAD based on her GAD score. I discussed initiating Cymbalta which may also help with her back pain. She with this and I started her on Cymbalta 30 mg QD. She is also interested in CCM referral to connect her with a Psychologist for counseling. CCM referral placed. F/U in 4 weeks for reassessment.    Andrena Mews, MD [place]

## 2021-05-02 NOTE — Patient Instructions (Signed)
Low Back Sprain or Strain Rehab Ask your health care provider which exercises are safe for you. Do exercises exactly as told by your health care provider and adjust them as directed. It is normal to feel mild stretching, pulling, tightness, or discomfort as you do these exercises. Stop right away if you feel sudden pain or your pain gets worse. Do not begin these exercises until told by your health care provider. Stretching and range-of-motion exercises These exercises warm up your muscles and joints and improve the movement and flexibility of your back. These exercises also help to relieve pain, numbness, and tingling. Lumbar rotation  Lie on your back on a firm bed or the floor with your knees bent. Straighten your arms out to your sides so each arm forms a 90-degree angle (right angle) with a side of your body. Slowly move (rotate) both of your knees to one side of your body until you feel a stretch in your lower back (lumbar). Try not to let your shoulders lift off the floor. Hold this position for __________ seconds. Tense your abdominal muscles and slowly move your knees back to the starting position. Repeat this exercise on the other side of your body. Repeat __________ times. Complete this exercise __________ times a day. Single knee to chest  Lie on your back on a firm bed or the floor with both legs straight. Bend one of your knees. Use your hands to move your knee up toward your chest until you feel a gentle stretch in your lower back and buttock. Hold your leg in this position by holding on to the front of your knee. Keep your other leg as straight as possible. Hold this position for __________ seconds. Slowly return to the starting position. Repeat with your other leg. Repeat __________ times. Complete this exercise __________ times a day. Prone extension on elbows  Lie on your abdomen on a firm bed or the floor (prone position). Prop yourself up on your elbows. Use your arms  to help lift your chest up until you feel a gentle stretch in your abdomen and your lower back. This will place some of your body weight on your elbows. If this is uncomfortable, try stacking pillows under your chest. Your hips should stay down, against the surface that you are lying on. Keep your hip and back muscles relaxed. Hold this position for __________ seconds. Slowly relax your upper body and return to the starting position. Repeat __________ times. Complete this exercise __________ times a day. Strengthening exercises These exercises build strength and endurance in your back. Endurance is the ability to use your muscles for a long time, even after they get tired. Pelvic tilt This exercise strengthens the muscles that lie deep in the abdomen. Lie on your back on a firm bed or the floor with your legs extended. Bend your knees so they are pointing toward the ceiling and your feet are flat on the floor. Tighten your lower abdominal muscles to press your lower back against the floor. This motion will tilt your pelvis so your tailbone points up toward the ceiling instead of pointing to your feet or the floor. To help with this exercise, you may place a small towel under your lower back and try to push your back into the towel. Hold this position for __________ seconds. Let your muscles relax completely before you repeat this exercise. Repeat __________ times. Complete this exercise __________ times a day. Alternating arm and leg raises  Get on your hands  and knees on a firm surface. If you are on a hard floor, you may want to use padding, such as an exercise mat, to cushion your knees. Line up your arms and legs. Your hands should be directly below your shoulders, and your knees should be directly below your hips. Lift your left leg behind you. At the same time, raise your right arm and straighten it in front of you. Do not lift your leg higher than your hip. Do not lift your arm higher  than your shoulder. Keep your abdominal and back muscles tight. Keep your hips facing the ground. Do not arch your back. Keep your balance carefully, and do not hold your breath. Hold this position for __________ seconds. Slowly return to the starting position. Repeat with your right leg and your left arm. Repeat __________ times. Complete this exercise __________ times a day. Abdominal set with straight leg raise  Lie on your back on a firm bed or the floor. Bend one of your knees and keep your other leg straight. Tense your abdominal muscles and lift your straight leg up, 4-6 inches (10-15 cm) off the ground. Keep your abdominal muscles tight and hold this position for __________ seconds. Do not hold your breath. Do not arch your back. Keep it flat against the ground. Keep your abdominal muscles tense as you slowly lower your leg back to the starting position. Repeat with your other leg. Repeat __________ times. Complete this exercise __________ times a day. Single leg lower with bent knees Lie on your back on a firm bed or the floor. Tense your abdominal muscles and lift your feet off the floor, one foot at a time, so your knees and hips are bent in 90-degree angles (right angles). Your knees should be over your hips and your lower legs should be parallel to the floor. Keeping your abdominal muscles tense and your knee bent, slowly lower one of your legs so your toe touches the ground. Lift your leg back up to return to the starting position. Do not hold your breath. Do not let your back arch. Keep your back flat against the ground. Repeat with your other leg. Repeat __________ times. Complete this exercise __________ times a day. Posture and body mechanics Good posture and healthy body mechanics can help to relieve stress in your body's tissues and joints. Body mechanics refers to the movements and positions of your body while you do your daily activities. Posture is part of body  mechanics. Good posture means: Your spine is in its natural S-curve position (neutral). Your shoulders are pulled back slightly. Your head is not tipped forward (neutral). Follow these guidelines to improve your posture and body mechanics in your everyday activities. Standing  When standing, keep your spine neutral and your feet about hip-width apart. Keep a slight bend in your knees. Your ears, shoulders, and hips should line up. When you do a task in which you stand in one place for a long time, place one foot up on a stable object that is 2-4 inches (5-10 cm) high, such as a footstool. This helps keep your spine neutral. Sitting  When sitting, keep your spine neutral and keep your feet flat on the floor. Use a footrest, if necessary, and keep your thighs parallel to the floor. Avoid rounding your shoulders, and avoid tilting your head forward. When working at a desk or a computer, keep your desk at a height where your hands are slightly lower than your elbows. Slide your  chair under your desk so you are close enough to maintain good posture. When working at a computer, place your monitor at a height where you are looking straight ahead and you do not have to tilt your head forward or downward to look at the screen. Resting When lying down and resting, avoid positions that are most painful for you. If you have pain with activities such as sitting, bending, stooping, or squatting, lie in a position in which your body does not bend very much. For example, avoid curling up on your side with your arms and knees near your chest (fetal position). If you have pain with activities such as standing for a long time or reaching with your arms, lie with your spine in a neutral position and bend your knees slightly. Try the following positions: Lying on your side with a pillow between your knees. Lying on your back with a pillow under your knees. Lifting  When lifting objects, keep your feet at least  shoulder-width apart and tighten your abdominal muscles. Bend your knees and hips and keep your spine neutral. It is important to lift using the strength of your legs, not your back. Do not lock your knees straight out. Always ask for help to lift heavy or awkward objects. This information is not intended to replace advice given to you by your health care provider. Make sure you discuss any questions you have with your health care provider. Document Revised: 09/23/2020 Document Reviewed: 09/23/2020 Elsevier Patient Education  [place].

## 2021-05-02 NOTE — Assessment & Plan Note (Signed)
Etiology unclear. No neurologic deficit. ?? Due to overuse or her breast weight on her back since it is aggravated by not wearing a firm bra. Xray ordered to evaluate further. I gave her home back exercise and escribed Flexeril and Ibuprofen prn. I advised her not to use flexeril when driving or operating machinery. F/U as needed. She verbalized understanding.

## 2021-05-06 ENCOUNTER — Telehealth: Payer: Self-pay | Admitting: Family Medicine

## 2021-05-06 NOTE — Telephone Encounter (Signed)
HIPAA compliant callback message left. Please pass on the following info should she call back. Thanks.       Your xray report is normal. Use Ibuprofen and flexeril as needed for pain. We may consider referral to physical therapy if this does not improve. Please, contact us soon for any questions.

## 2021-05-14 ENCOUNTER — Other Ambulatory Visit: Payer: Self-pay | Admitting: Licensed Clinical Social Worker

## 2021-05-14 DIAGNOSIS — F411 Generalized anxiety disorder: Secondary | ICD-10-CM

## 2021-05-14 NOTE — Patient Outreach (Addendum)
Medicaid Managed Care Social Work Note  05/14/2021 Name:  Ulani Degrasse MRN:  [id number] DOB:  10-01-2000  Breely Panik is an 20 y.o. year old female who is a primary patient of [place], Gabbs, DO.  The Medicaid Managed Care Coordination team was consulted for assistance with:  [facility]  Ms. Vanlue was given information about Medicaid Managed Care Coordination team services today. [name] agreed to services and verbal consent obtained.  Engaged with patient  for by telephone forinitial visit in response to referral for case management and/or care coordination services.   Assessments/Interventions:  Review of past medical history, allergies, medications, health status, including review of consultants reports, laboratory and other test data, was performed as part of comprehensive evaluation and provision of chronic care management services.  SDOH: (Social Determinant of Health) assessments and interventions performed: SDOH Interventions    Flowsheet Row Most Recent Value  SDOH Interventions   Depression Interventions/Treatment  Referral to Psychiatry       Advanced Directives Status:  See Care Plan for related entries.  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 Sig Documenting Provider Last Dose Status Informant  cetirizine (ZYRTEC) 10 MG tablet [id number] Yes Take 1 tablet (10 mg total) by mouth daily.  Patient taking differently: Take 10 mg by mouth daily as needed.   Caroline More, DO Past Month Active Self  fluticasone (FLONASE) 50 MCG/ACT nasal spray  [id number] No Place 2 sprays into both nostrils daily.  Patient taking differently: Place 2 sprays into both nostrils daily as needed.   Brunetta Jeans, PA-C More than a month Active Self  ibuprofen (ADVIL) 600 MG tablet [id number] Yes Take 1 tablet (600 mg total) by mouth every 8 (eight) hours as needed. Mar Daring, PA-C Past Month Active   polyethylene glycol powder (GLYCOLAX/MIRALAX) 17 GM/SCOOP powder [id number] Yes Take 17 g by mouth 2 (two) times daily as needed for mild constipation or moderate constipation. Shirley, [place], DO Past Month Active Self  psyllium (METAMUCIL SMOOTH TEXTURE) 58.6 % powder [id number] Yes Take 1 packet by mouth daily.  Patient taking differently: Take 1 packet by mouth every other day.   [name], MD Past Week Active Self            Patient Active Problem List   Diagnosis Date Noted   Low back pain 05/02/2021   Headache 09/03/2020   Nonspecific abdominal pain 08/30/2020   Ganglion cyst of dorsum of left wrist 11/03/2019   Constipation 01/21/2018   Irregular periods 01/21/2018   Generalized anxiety disorder 01/21/2018    Conditions to be addressed/monitored per PCP order:  [place] : General Social Work (Adult)  Updates made by Greg Cutter, LCSW since 05/14/2021 12:00 AM     Problem: Anxiety Identification (Anxiety)   Note:   Timeframe:  Long-Range Goal Priority:  High Start Date:  05/14/21                        Expected End  Date: ongoing                    Follow Up Date 05/27/21       Long-Range Goal: Anxiety Symptoms Identified   Start Date: 05/14/2021  Note:   Timeframe:  Long-Range Goal Priority:  High Start Date:  05/14/21                        Expected End Date: ongoing                    Follow Up Date 05/27/21   Current barriers:   Chronic Mental Health needs related to anxiety, depression and sleeping troubles Mental Health Concerns  and Social Isolation Needs Support, Education,  and Care Coordination in order to meet unmet mental health needs. Clinical Goal(s): demonstrate a reduction in symptoms related to :Anxiety with Excessive Worry, Panic Symptoms, and Insomnia/Sleep Difficulties  verbalize understanding of plan for management of Anxiety   Clinical Interventions:  Assessed patient's previous and current treatment, coping skills, support system and barriers to care  Motivational Interviewing employed Depression screen reviewed  PHQ2/ PHQ9 completed Active listening / Reflection utilized  [place] strategies reviewed Provided psychoeducation for mental health needs  Participation in counseling encouraged  Crisis Resource Education / information provided  Suicidal Ideation/Homicidal Ideation assessed: [place] of Attorney  Discussed referral for psychiatry  Made referral to [facility] on 05/14/21  ; Review various resources, discussed options and provided patient information about  Options for mental health treatment based on need and insurance Patient is a 20 year old female that is experiencing anxiety, sleeping difficulties and depression. She is in need of therapy and medication management and is open to a referral to [facility]. She was educated on their walk in clinic as well and was encouraged to contact provider directly if she wishes to gain more information about their various programs. Patient declines any suicidal ideations.  10 LITTLE Things To Do When You're Feeling Too Down To Do Anything  Take a shower. Even if you plan to stay in all day long and not see a soul, take a shower. It takes the most effort to hop in to the shower but once you do, you'll feel immediate results. It will wake you up and you'll be feeling much fresher (and cleaner too).  Brush and floss your teeth. Give your teeth a good brushing with a floss finish. It's a small task but it feels so good and you can check 'taking care of your health' off the  list of things to do.  Do something small on your list. Most of [place] have some small thing we would like to get done (load of laundry, sew a button, email a friend). Doing one of these things will make you feel like you've accomplished something.  Drink water. Drinking water is easy right? It's also really beneficial for your health so keep a glass beside you all day and take sips often. It gives you energy and prevents you from boredom eating.  Do some floor exercises. The last thing you want to do is exercise but it might be just the thing you need the most. Keep it simple and do exercises that involve sitting or laying on the floor. Even the smallest of exercises release chemicals in the brain that make you feel good. Yoga stretches or core exercises are going to make you feel good with  minimal effort.  Make your bed. Making your bed takes a few minutes but it's productive and you'll feel relieved when it's done. An unmade bed is a huge visual reminder that you're having an unproductive day. Do it and consider it your housework for the day.  Put on some nice clothes. Take the sweatpants off even if you don't plan to go anywhere. Put on clothes that make you feel good. Take a look in the mirror so your brain recognizes the sweatpants have been replaced with clothes that make you look great. It's an instant confidence booster.  Wash the dishes. A pile of dirty dishes in the sink is a reflection of your mood. It's possible that if you wash up the dishes, your mood will follow suit. It's worth a try.  Cook a real meal. If you have the luxury to have a "do nothing" day, you have time to make a real meal for yourself. Make a meal that you love to eat. The process is good to get you out of the funk and the food will ensure you have more energy for tomorrow.  Write out your thoughts by hand. When you hand write, you stimulate your brain to focus on the moment that you're in so make yourself  comfortable and write whatever comes into your mind. Put those thoughts out on paper so they stop spinning around in your head. Those thoughts might be the very thing holding you down. LCSW provided education on relaxation techniques such as meditation, deep breathing, massage, grounding exercsies or yoga that can activate the body's relaxation response and ease symptoms of stress and anxiety. LCSW ask that when pt is struggling with difficult emotions and racing thoughts that they start this relaxation response process. LCSW provided extensive education on healthy coping skills for anxiety. SW used active and reflective listening, validated patient's feelings/concerns, and provided emotional support. Inter-disciplinary care team collaboration (see longitudinal plan of care) Patient Goals/Self-Care Activities: Over the next 120 days connect with provider for ongoing mental health treatment.   Increase coping skills, healthy habits, self-management skills, and stress reduction      Depression screen [facility] 2/9 05/14/2021 05/02/2021 09/03/2020 02/15/2020 11/02/2019  Decreased Interest 2 2 1 2 1   Down, Depressed, Hopeless - 1 0 1 1  PHQ - 2 Score 2 3 1 3 2   Altered sleeping 3 3 2 2 3   Tired, decreased energy 3 3 2 2 2   Change in appetite 3 3 2 2 3   Feeling bad or failure about yourself  0 0 0 0 0  Trouble concentrating 0 0 0 0 0  Moving slowly or fidgety/restless 0 0 0 0 0  Suicidal thoughts 0 0 0 0 0  PHQ-9 Score 11 12 7 9 10   Difficult doing work/chores Somewhat difficult Somewhat difficult Very difficult - Somewhat difficult    Follow up:  Patient agrees to Care Plan and Follow-up.  Plan: The Managed Medicaid care management team will reach out to the patient again over the next 30 days.  Date/time of next scheduled Social Work care management/care coordination outreach:  05/27/21  Eula Fried, BSW, MSW, LCSW Managed Medicaid LCSW [email or web address] Phone: [phone]

## 2021-05-14 NOTE — Patient Instructions (Signed)
Visit Information  Jacqueline Gonzalez was given information about Medicaid Managed Care team care coordination services as a part of their Healthy [employer] Medicaid benefit. [name] verbally consented to engagement with the [facility] Managed Care team.   If you are experiencing a medical emergency, please call 911 or report to your local emergency department or urgent care.   If you have a non-emergency medical problem during routine business hours, please contact your provider's office and ask to speak with a nurse.   For questions related to your Healthy [facility] health plan, please call: [phone] or visit the homepage here: [email or web address]  If you would like to schedule transportation through your Healthy [facility] plan, please call the following number at least 2 days in advance of your appointment: [phone]  Call the [place] at [phone], at any time, 24 hours a day, 7 days a week. If you are in danger or need immediate medical attention call 911.  If you would like help to quit smoking, call 1-800-QUIT-NOW 4030549748) OR Espaol: 1-855-Djelo-Ya (6-195-093-2671) o para ms informacin haga clic aqu or Text READY to 200-400 to register via text  [name] - following are the goals we discussed in your visit today:   Goals Addressed             This Visit's Progress    Begin and Stick with Counseling-Depression       Timeframe:  Long-Range Goal Priority:  High Start Date:  05/14/21                        Expected End Date: ongoing                    Follow Up Date 05/27/21    - check out counseling - keep 90 percent of counseling appointments - schedule counseling appointment    Why is this important?   Beating depression may take some time.  If you don't feel better right away, don't give up on your treatment plan.    Notes:         Eula Fried, BSW, MSW, [employer] Managed  Medicaid LCSW [email or web address] Phone: [phone]

## 2021-05-22 ENCOUNTER — Telehealth: Payer: Medicaid Other | Admitting: Family Medicine

## 2021-05-22 DIAGNOSIS — R6889 Other general symptoms and signs: Secondary | ICD-10-CM

## 2021-05-22 MED ORDER — OSELTAMIVIR PHOSPHATE 75 MG PO CAPS: 75.0000 mg | ORAL_CAPSULE | Freq: Two times a day (BID) | ORAL | 0 refills | Status: AC

## 2021-05-22 NOTE — Progress Notes (Signed)
E visit for Flu like symptoms   We are sorry that you are not feeling well.  Here is how we plan to help! Based on what you have shared with me it looks like you may have a respiratory virus that may be influenza.  Influenza or "the flu" is   an infection caused by a respiratory virus. The flu virus is highly contagious and persons who did not receive their yearly flu vaccination may "catch" the flu from close contact.  We have anti-viral medications to treat the viruses that cause this infection. They are not a "cure" and only shorten the course of the infection. These prescriptions are most effective when they are given within the first 2 days of "flu" symptoms. Antiviral medication are indicated if you have a high risk of complications from the flu. You should  also consider an antiviral medication if you are in close contact with someone who is at risk. These medications can help patients avoid complications from the flu  but have side effects that you should know. Possible side effects from Tamiflu or oseltamivir include nausea, vomiting, diarrhea, dizziness, headaches, eye redness, sleep problems or other respiratory symptoms. You should not take Tamiflu if you have an allergy to oseltamivir or any to the ingredients in Tamiflu.  Based upon your symptoms and potential risk factors I have prescribed Oseltamivir (Tamiflu).  It has been sent to your designated pharmacy.  You will take one 75 mg capsule orally twice a day for the next 5 days.  ANYONE WHO HAS FLU SYMPTOMS SHOULD: Stay home. The flu is highly contagious and going out or to work exposes others! Be sure to drink plenty of fluids. Water is fine as well as fruit juices, sodas and electrolyte beverages. You may want to stay away from caffeine or alcohol. If you are nauseated, try taking small sips of liquids. How do you know if you are getting enough fluid? Your urine should be a pale yellow or almost colorless. Get rest. Taking a steamy  shower or using a humidifier may help nasal congestion and ease sore throat pain. Using a saline nasal spray works much the same way. Cough drops, hard candies and sore throat lozenges may ease your cough. Line up a caregiver. Have someone check on you regularly.   GET HELP RIGHT AWAY IF: You cannot keep down liquids or your medications. You become short of breath Your fell like you are going to pass out or loose consciousness. Your symptoms persist after you have completed your treatment plan MAKE SURE YOU  Understand these instructions. Will watch your condition. Will get help right away if you are not doing well or get worse.  Your e-visit answers were reviewed by a board certified advanced clinical practitioner to complete your personal care plan.  Depending on the condition, your plan could have included both over the counter or prescription medications.  If there is a problem please reply  once you have received a response from your provider.  Your safety is important to [place].  If you have drug allergies check your prescription carefully.    You can use MyChart to ask questions about today's visit, request a non-urgent call back, or ask for a work or school excuse for 24 hours related to this e-Visit. If it has been greater than 24 hours you will need to follow up with your provider, or enter a new e-Visit to address those concerns.  You will get an e-mail in the next  two days asking about your experience.  I hope that your e-visit has been valuable and will speed your recovery. Thank you for using e-visits.  I provided 5 minutes of non face-to-face time during this encounter for chart review, medication and order placement, as well as and documentation.

## 2021-05-27 ENCOUNTER — Other Ambulatory Visit: Payer: Self-pay

## 2021-05-27 NOTE — Patient Outreach (Signed)
[name]) Care Management  05/27/2021  Jacqueline Gonzalez 24-Aug-2000 [id number]  LCSW completed [facility] outreach attempt today but was unable to reach patient successfully. A HIPPA compliant voice message was unable to be left encouraging patient to return call once available as phone number was not able to be completed as dialed. LCSW will ask Scheduling Care Guide to reschedule [facility] SW appointment with patient as well.  Eula Fried, BSW, MSW, [employer] Managed Medicaid LCSW [email or web address] Phone: [phone]

## 2021-05-27 NOTE — Patient Instructions (Signed)
[name] ,   The [facility] Managed Care Team is available to provide assistance to you with your healthcare needs at no cost and as a benefit of your [facility] Health plan. I'm sorry I was unable to reach you today for our scheduled appointment. Our care guide will call you to reschedule our telephone appointment. Please call me at the number below. I am available to be of assistance to you regarding your healthcare needs. .   Thank you,   Eula Fried, BSW, MSW, LCSW Managed Medicaid LCSW [email or web address] Phone: [phone]

## 2021-05-28 ENCOUNTER — Telehealth: Payer: Self-pay | Admitting: Family Medicine

## 2021-05-28 NOTE — Telephone Encounter (Signed)
..   Medicaid Managed Care   Unsuccessful Outreach Note  05/28/2021 Name: Jacqueline Gonzalez MRN: [id number] DOB: [birth year]  Referred by: Donney Dice, DO Reason for referral : High Risk Managed Medicaid (I called the patient today to see if she wanted to reschedule her missed phone visit with the MM LCSW. I left my name and number on her VM.)   An unsuccessful telephone outreach was attempted today. The patient was referred to the case management team for assistance with care management and care coordination.   Follow Up Plan: The care management team will reach out to the patient again over the next 7 days.   Whitney

## 2021-05-29 ENCOUNTER — Other Ambulatory Visit: Payer: Self-pay | Admitting: Licensed Clinical Social Worker

## 2021-05-29 NOTE — Patient Outreach (Signed)
Medicaid Managed Care Social Work Note  05/29/2021 Name:  Jacqueline Gonzalez MRN:  [id number] DOB:  25-Nov-2000  Jacqueline Gonzalez is an 20 y.o. year old female who is a primary patient of Jacqueline Gonzalez, [place], Gonzalez.  The Medicaid Managed Care Coordination team was consulted for assistance with:  Jacqueline Gonzalez and Resources  Jacqueline Gonzalez was given information about Medicaid Managed Care Coordination team services today. Jacqueline Gonzalez Patient agreed to services and verbal consent obtained.  Engaged with patient  for by telephone forfollow up visit in response to referral for case management and/or care coordination services.   Assessments/Interventions:  Review of past medical history, allergies, medications, health status, including review of consultants reports, laboratory and other test data, was performed as part of comprehensive evaluation and provision of chronic care management services.  SDOH: (Social Determinant of Health) assessments and interventions performed: SDOH Interventions    Flowsheet Row Most Recent Value  SDOH Interventions   Stress Interventions Provide Counseling, Offered [employer] Resources       Advanced Directives Status:  See Care Plan for related entries.  Care Plan                 Allergies  Allergen Reactions   Blue Dyes (Parenteral) Anaphylaxis   Blueberry [Vaccinium Angustifolium] Anaphylaxis   Raspberry Anaphylaxis   Shellfish Allergy Shortness Of Breath    ALL SHELLFISH    Medications Reviewed Today     Reviewed by Jacqueline Mayo, NP (Nurse Practitioner) on 05/22/21 at 1556  Med List Status: <None>   Medication Order Taking? Sig Documenting Provider Last Dose Status Informant  cetirizine (ZYRTEC) 10 MG tablet [id number] No Take 1 tablet (10 mg total) by mouth daily.  Patient not taking: Reported on 05/02/2021   Jacqueline Gonzalez Not Taking Active Self  cyclobenzaprine (FLEXERIL) 5 MG tablet [id number]  Take 1 tablet (5 mg total)  by mouth 2 (two) times daily as needed for muscle spasms. Jacqueline Gonzalez  Active   DULoxetine (CYMBALTA) 30 MG capsule [id number]  Take 1 capsule (30 mg total) by mouth daily. Jacqueline Gonzalez  Active   fluticasone [facility]) 50 MCG/ACT nasal spray [id number] No Place 2 sprays into both nostrils daily.  Patient not taking: Reported on 05/02/2021   Jacqueline Gonzalez Not Taking Active Self  ibuprofen (ADVIL) 600 MG tablet [id number]  Take 1 tablet (600 mg total) by mouth every 8 (eight) hours as needed. Jacqueline Gonzalez  Active   polyethylene glycol powder (GLYCOLAX/MIRALAX) 17 GM/SCOOP powder [id number] No Take 17 g by mouth 2 (two) times daily as needed for mild constipation or moderate constipation.  Patient not taking: Reported on 05/02/2021   Jacqueline Gonzalez, Jacqueline Gonzalez Not Taking Active Self  psyllium (METAMUCIL SMOOTH TEXTURE) 58.6 % powder [id number] No Take 1 packet by mouth daily.  Patient not taking: Reported on 05/02/2021   Jacqueline Gonzalez Not Taking Active Self            Patient Active Problem List   Diagnosis Date Noted   Low back pain 05/02/2021   Headache 09/03/2020   Nonspecific abdominal pain 08/30/2020   Ganglion cyst of dorsum of left wrist 11/03/2019   Constipation 01/21/2018   Irregular periods 01/21/2018   Generalized anxiety disorder 01/21/2018    Conditions to be addressed/monitored per PCP order:  Anxiety and Depression  Care Plan : General Social Work (Adult)  Updates made by Jacqueline Cutter, LCSW since 05/29/2021 12:00 AM  Problem: Anxiety Identification (Anxiety)   Note:   Timeframe:  Long-Range Goal Priority:  High Start Date:  05/14/21                        Expected End Date: ongoing                    Follow Up Date 05/27/21       Long-Range Goal: Anxiety Symptoms Identified   Start Date: 05/14/2021  Note:   Timeframe:  Long-Range Goal Priority:  High Start Date:  05/14/21                        Expected End  Date: ongoing                    Follow Up Date 06/20/21   Current barriers:   Chronic Mental Health needs related to anxiety, depression and sleeping troubles Mental Health Concerns  and Social Isolation Needs Support, Education, and Care Coordination in order to meet unmet mental health needs. Clinical Goal(s): demonstrate a reduction in symptoms related to :Anxiety with Excessive Worry, Panic Symptoms, and Insomnia/Sleep Difficulties  verbalize understanding of plan for management of Anxiety   Clinical Interventions:  Assessed patient's previous and current treatment, coping skills, support system and barriers to care  Motivational Interviewing employed Depression screen reviewed  PHQ2/ PHQ9 completed Active listening / Reflection utilized  Problem Eleva strategies reviewed Provided psychoeducation for mental health needs  Participation in counseling encouraged  Crisis Resource Education / information provided  Suicidal Ideation/Homicidal Ideation assessed: [name]  Discussed referral for psychiatry  Made referral to [facility] on 05/14/21  ; UPDATE- Patient has a psychiatrist appointment om 08/01/20 and a counseling appointment on 12/522. Patient is unaware if these are virtual or in person appointments and advised her to contact [facility] to question this.   Review various resources, discussed options and provided patient information about  Options for mental health treatment based on need and insurance Patient is a 20 year old female that is experiencing anxiety, sleeping difficulties and depression. She is in need of therapy and medication management and is open to a referral to [facility]. She was educated on their walk in clinic as well and was encouraged to contact provider directly if she wishes to gain Gonzalez information about their various programs. Patient declines any suicidal ideations.  10 LITTLE Things To Gonzalez When You're Feeling Too Down To Gonzalez  Anything  Take a shower. Even if you plan to stay in all day long and not see a soul, take a shower. It takes the most effort to hop in to the shower but once you Gonzalez, you'll feel immediate results. It will wake you up and you'll be feeling much fresher (and cleaner too).  Brush and floss your teeth. Give your teeth a good brushing with a floss finish. It's a small task but it feels so good and you can check 'taking care of your health' off the list of things to Gonzalez.  Gonzalez something small on your list. Most of [place] have some small thing we would like to get done (load of laundry, sew a button, email a friend). Doing one of these things will make you feel like you've accomplished something.  Drink water. Drinking water is easy right? It's also really beneficial for your health so keep a glass beside you all day  and take sips often. It gives you energy and prevents you from boredom eating.  Gonzalez some floor exercises. The last thing you want to Gonzalez is exercise but it might be just the thing you need the most. Keep it simple and Gonzalez exercises that involve sitting or laying on the floor. Even the smallest of exercises release chemicals in the brain that make you feel good. Yoga stretches or core exercises are going to make you feel good with minimal effort.  Make your bed. Making your bed takes a few minutes but it's productive and you'll feel relieved when it's done. An unmade bed is a huge visual reminder that you're having an unproductive day. Gonzalez it and consider it your housework for the day.  Put on some nice clothes. Take the sweatpants off even if you don't plan to go anywhere. Put on clothes that make you feel good. Take a look in the mirror so your brain recognizes the sweatpants have been replaced with clothes that make you look great. It's an instant confidence booster.  Wash the dishes. A pile of dirty dishes in the sink is a reflection of your mood. It's possible that if you wash up the dishes,  your mood will follow suit. It's worth a try.  Cook a real meal. If you have the luxury to have a "Gonzalez nothing" day, you have time to make a real meal for yourself. Make a meal that you love to eat. The process is good to get you out of the funk and the food will ensure you have Gonzalez energy for tomorrow.  Write out your thoughts by hand. When you hand write, you stimulate your brain to focus on the moment that you're in so make yourself comfortable and write whatever comes into your mind. Put those thoughts out on paper so they stop spinning around in your head. Those thoughts might be the very thing holding you down. LCSW provided education on relaxation techniques such as meditation, deep breathing, massage, grounding exercsies or yoga that can activate the body's relaxation response and ease symptoms of stress and anxiety. LCSW ask that when pt is struggling with difficult emotions and racing thoughts that they start this relaxation response process. LCSW provided extensive education on healthy coping skills for anxiety. SW used active and reflective listening, validated patient's feelings/concerns, and provided emotional support. Inter-disciplinary care team collaboration (see longitudinal plan of care) Patient Goals/Self-Care Activities: Over the next 120 days connect with provider for ongoing mental health treatment.   Increase coping skills, healthy habits, self-management skills, and stress reduction       Follow up:  Patient agrees to Care Plan and Follow-up.  Plan: The Managed Medicaid care management team will reach out to the patient again over the next 30 days.  Date of next scheduled Social Work care management/care coordination outreach:  06/20/21  Eula Fried, BSW, MSW, LCSW Managed Medicaid LCSW [email or web address] Phone: [phone]

## 2021-05-30 NOTE — Patient Instructions (Signed)
Visit Information  Jacqueline Gonzalez was given information about Medicaid Managed Care team care coordination services as a part of their [employer] Medicaid benefit. Jacqueline Gonzalez verbally consented to engagement with the [facility] Managed Care team.   If you are experiencing a medical emergency, please call 911 or report to your local emergency department or urgent care.   If you have a non-emergency medical problem during routine business hours, please contact your provider's office and ask to speak with a nurse.   For questions related to your Healthy [facility] health plan, please call: [phone] or visit the homepage here: [email or web address]  If you would like to schedule transportation through your Healthy [facility] plan, please call the following number at least 2 days in advance of your appointment: [phone]  Call the [place] at [phone], at any time, 24 hours a day, 7 days a week. If you are in danger or need immediate medical attention call 911.  If you would like help to quit smoking, call 1-800-QUIT-NOW 442-852-1789) OR Espaol: 1-855-Djelo-Ya (5-597-416-3845) o para ms informacin haga clic aqu or Text READY to 200-400 to register via text  Jacqueline Gonzalez - following are the goals we discussed in your visit today:   Goals Addressed             This Visit's Progress    Begin and Stick with Counseling-Depression       Timeframe:  Long-Range Goal Priority:  High Start Date:  05/14/21                        Expected End Date: ongoing                    Follow Up Date 06/20/21   - check out counseling - keep 90 percent of counseling appointments - schedule counseling appointment    Why is this important?   Beating depression may take some time.  If you don't feel better right away, don't give up on your treatment plan.    Notes:         Eula Fried, BSW, MSW, [employer] Managed Medicaid  [email or web address] Phone: [phone]

## 2021-06-05 ENCOUNTER — Encounter: Payer: Self-pay | Admitting: Emergency Medicine

## 2021-06-05 ENCOUNTER — Ambulatory Visit
Admission: EM | Admit: 2021-06-05 | Discharge: 2021-06-05 | Disposition: A | Discharge status: Home / Self Care | Payer: Medicaid Other | Attending: Physician Assistant | Admitting: Physician Assistant

## 2021-06-05 ENCOUNTER — Other Ambulatory Visit: Payer: Self-pay

## 2021-06-05 ENCOUNTER — Telehealth: Payer: Medicaid Other | Admitting: Physician Assistant

## 2021-06-05 DIAGNOSIS — R55 Syncope and collapse: Secondary | ICD-10-CM

## 2021-06-05 DIAGNOSIS — R6889 Other general symptoms and signs: Secondary | ICD-10-CM | POA: Diagnosis not present

## 2021-06-05 LAB — POCT INFLUENZA A/B
Influenza A, POC: NEGATIVE
Influenza B, POC: NEGATIVE

## 2021-06-05 MED ORDER — OSELTAMIVIR PHOSPHATE 75 MG PO CAPS: 75.0000 mg | ORAL_CAPSULE | Freq: Two times a day (BID) | ORAL | 0 refills | Status: DC

## 2021-06-05 NOTE — Patient Instructions (Addendum)
  [name], thank you for joining [name], PA-C for today's virtual visit.  While this provider is not your primary care provider (PCP), if your PCP is located in our provider database this encounter information will be shared with them immediately following your visit.  Consent: (Patient) [name] provided verbal consent for this virtual visit at the beginning of the encounter.  Current Medications:  Current Outpatient Medications:    cetirizine (ZYRTEC) 10 MG tablet, Take 1 tablet (10 mg total) by mouth daily. (Patient not taking: Reported on 05/02/2021), Disp: 30 tablet, Rfl: 11   cyclobenzaprine (FLEXERIL) 5 MG tablet, Take 1 tablet (5 mg total) by mouth 2 (two) times daily as needed for muscle spasms., Disp: 30 tablet, Rfl: 0   DULoxetine (CYMBALTA) 30 MG capsule, Take 1 capsule (30 mg total) by mouth daily., Disp: 30 capsule, Rfl: 1   fluticasone (FLONASE) 50 MCG/ACT nasal spray, Place 2 sprays into both nostrils daily. (Patient not taking: Reported on 05/02/2021), Disp: 16 g, Rfl: 0   ibuprofen (ADVIL) 600 MG tablet, Take 1 tablet (600 mg total) by mouth every 8 (eight) hours as needed., Disp: 60 tablet, Rfl: 0   polyethylene glycol powder (GLYCOLAX/MIRALAX) 17 GM/SCOOP powder, Take 17 g by mouth 2 (two) times daily as needed for mild constipation or moderate constipation. (Patient not taking: Reported on 05/02/2021), Disp: 3350 g, Rfl: 1   psyllium (METAMUCIL SMOOTH TEXTURE) 58.6 % powder, Take 1 packet by mouth daily. (Patient not taking: Reported on 05/02/2021), Disp: 283 g, Rfl: 2   Medications ordered in this encounter:  No orders of the defined types were placed in this encounter.    *If you need refills on other medications prior to your next appointment, please contact your pharmacy*  Follow-Up: Call back or seek an in-person evaluation if the symptoms worsen or if the condition fails to improve as anticipated.  Other Instructions Have your mom to  take you to be seen in person ASAP. You can use link below to schedule at a local Urgent Care since no recurrence of severe issues earlier today. If anything worsens or new symptoms develop in the meantime, you need to be taken to nearest ER or call 911.   If you have been instructed to have an in-person evaluation today at a local Urgent Care facility, please use the link below. It will take you to a list of all of our available [facility], including address, phone number and hours of operation. Please do not delay care.  [facility]  If you or a family member do not have a primary care provider, use the link below to schedule a visit and establish care. When you choose a [facility] primary care physician or advanced practice provider, you gain a long-term partner in health. Find a Primary Care Provider  Learn more about 's in-office and virtual care options: [facility]

## 2021-06-05 NOTE — Progress Notes (Signed)
Virtual Visit Consent   Jacqueline Gonzalez, you are scheduled for a virtual visit with a [facility] provider today.     Just as with appointments in the office, your consent must be obtained to participate.  Your consent will be active for this visit and any virtual visit you may have with one of our providers in the next 365 days.     If you have a MyChart account, a copy of this consent can be sent to you electronically.  All virtual visits are billed to your insurance company just like a traditional visit in the office.    As this is a virtual visit, video technology does not allow for your provider to perform a traditional examination.  This may limit your provider's ability to fully assess your condition.  If your provider identifies any concerns that need to be evaluated in person or the need to arrange testing (such as labs, EKG, etc.), we will make arrangements to do so.     Although advances in technology are sophisticated, we cannot ensure that it will always work on either your end or our end.  If the connection with a video visit is poor, the visit may have to be switched to a telephone visit.  With either a video or telephone visit, we are not always able to ensure that we have a secure connection.     I need to obtain your verbal consent now.   Are you willing to proceed with your visit today?    [name] has provided verbal consent on 06/05/2021 for a virtual visit (video or telephone).   Jacqueline Gonzalez, [place]   Date: 06/05/2021 3:22 PM   Virtual Visit via Video Note   I, Jacqueline Gonzalez, connected with  Catricia Scheerer  ([id number], 2001-05-15) on 06/05/21 at  3:15 PM EST by a video-enabled telemedicine application and verified that I am speaking with the correct person using two identifiers.  Location: Patient: Virtual Visit Location Patient: Home Provider: Virtual Visit Location Provider: Home Office   I discussed the limitations of evaluation and  management by telemedicine and the availability of in person appointments. The patient expressed understanding and agreed to proceed.    History of Present Illness: Jacqueline Gonzalez is a 20 y.o. who identifies as a female who was assigned female at birth, and is being seen today for episode of weakness this AM followed by fatigue, nausea, diarrhea and some episodic dizziness. Notes having a Headache x 1 week intermittently. Notes alleviated with OTC medication but returning next day. Denies vomiting but some loose stool. Denies melena, hematochezia. Denies fever. Denies recent travel or sick contact. Denies change in diet. Started on Duloxetine and makes her feel "weird sometimes". Takes a night which helps.   Cannot check BP or HR.   HPI: HPI  Problems:  Patient Active Problem List   Diagnosis Date Noted   Low back pain 05/02/2021   Headache 09/03/2020   Nonspecific abdominal pain 08/30/2020   Ganglion cyst of dorsum of left wrist 11/03/2019   Constipation 01/21/2018   Irregular periods 01/21/2018   Generalized anxiety disorder 01/21/2018    Allergies:  Allergies  Allergen Reactions   Blue Dyes (Parenteral) Anaphylaxis   Blueberry [Vaccinium Angustifolium] Anaphylaxis   Raspberry Anaphylaxis   Shellfish Allergy Shortness Of Breath    ALL SHELLFISH   Medications:  Current Outpatient Medications:    cyclobenzaprine (FLEXERIL) 5 MG tablet, Take 1 tablet (5 mg total) by mouth 2 (two)  times daily as needed for muscle spasms., Disp: 30 tablet, Rfl: 0   DULoxetine (CYMBALTA) 30 MG capsule, Take 1 capsule (30 mg total) by mouth daily., Disp: 30 capsule, Rfl: 1   ibuprofen (ADVIL) 600 MG tablet, Take 1 tablet (600 mg total) by mouth every 8 (eight) hours as needed., Disp: 60 tablet, Rfl: 0   psyllium (METAMUCIL SMOOTH TEXTURE) 58.6 % powder, Take 1 packet by mouth daily. (Patient not taking: Reported on 05/02/2021), Disp: 283 g, Rfl: 2  Observations/Objective: Patient is well-developed,  well-nourished in no acute distress.  Resting comfortably at home.  Head is normocephalic, atraumatic.  No labored breathing. Speech is clear and coherent with logical content.  Patient is alert and oriented at baseline.   Assessment and Plan: 1. Collapse This morning without syncope. Now with symptoms of start of a viral gastroenteritis and already some possible preexisting and now exacerbated dehydration. She is talking clearly without issue on video visit. Is able to tolerate PO fluids. Mom is carrying her for acute evaluation at nearest UC/ER facility.   Follow Up Instructions: I discussed the assessment and treatment plan with the patient. The patient was provided an opportunity to ask questions and all were answered. The patient agreed with the plan and demonstrated an understanding of the instructions.  A copy of instructions were sent to the patient via MyChart unless otherwise noted below.   The patient was advised to call back or seek an in-person evaluation if the symptoms worsen or if the condition fails to improve as anticipated.  Time:  I spent 12 minutes with the patient via telehealth technology discussing the above problems/concerns.    Jacqueline Rio, PA-C

## 2021-06-05 NOTE — ED Triage Notes (Signed)
Patient c/o feeling weird this morning, episode of dizziness, nausea, racing heartbeat, headache.  Denies any medication.

## 2021-06-05 NOTE — ED Provider Notes (Signed)
Jacqueline Gonzalez    CSN: [id number] Arrival date & time: 06/05/21  1602      History   Chief Complaint Chief Complaint  Patient presents with   Headache    HPI Jacqueline Gonzalez is a 20 y.o. female.   Patient here today for evaluation of lightheadedness, nausea, headache and body aches that started about 2 to 3 hours ago.  She reports that she has had some palpitations as well.  She has not taken any medication for symptoms.  The history is provided by the patient.   Past Medical History:  Diagnosis Date   Constipation, chronic    Environmental allergies    Ganglion cyst of dorsum of left wrist    RECURRENT   GERD (gastroesophageal reflux disease)    watches diet   Headache    IBS (irritable bowel syndrome)    Lactose intolerance     Patient Active Problem List   Diagnosis Date Noted   Low back pain 05/02/2021   Headache 09/03/2020   Nonspecific abdominal pain 08/30/2020   Ganglion cyst of dorsum of left wrist 11/03/2019   Constipation 01/21/2018   Irregular periods 01/21/2018   Generalized anxiety disorder 01/21/2018    Past Surgical History:  Procedure Laterality Date   GANGLION CYST EXCISION Left 04/24/2021   Procedure: left recurrent dorsal carpal ganglion excision;  Surgeon: [name], MD;  Location: [facility];  Service: Orthopedics;  Laterality: Left;   TONSILLECTOMY AND ADENOIDECTOMY     AGE 49   WISDOM TOOTH EXTRACTION  03/18/2021   WRIST GANGLION EXCISION Left 02/2020    OB History   No obstetric history on file.      Home Medications    Prior to Admission medications   Medication Sig Start Date End Date Taking? Authorizing Provider  cyclobenzaprine (FLEXERIL) 5 MG tablet Take 1 tablet (5 mg total) by mouth 2 (two) times daily as needed for muscle spasms. 05/02/21  Yes Kinnie Feil, MD  DULoxetine (CYMBALTA) 30 MG capsule Take 1 capsule (30 mg total) by mouth daily. 05/02/21  Yes Kinnie Feil, MD   ibuprofen (ADVIL) 600 MG tablet Take 1 tablet (600 mg total) by mouth every 8 (eight) hours as needed. 05/02/21  Yes Kinnie Feil, MD  oseltamivir (TAMIFLU) 75 MG capsule Take 1 capsule (75 mg total) by mouth every 12 (twelve) hours. 06/05/21  Yes Francene Finders, PA-C  psyllium (METAMUCIL SMOOTH TEXTURE) 58.6 % powder Take 1 packet by mouth daily. Patient not taking: Reported on 05/02/2021 08/29/20   [name], MD    Family History Family History  Problem Relation Age of Onset   Asthma Father    Diabetes Maternal Grandmother    Diabetes Paternal Grandmother    Kidney disease Paternal Alanda Slim' disease Paternal Aunt     Social History Social History   Tobacco Use   Smoking status: Never   Smokeless tobacco: Never  Vaping Use   Vaping Use: Never used  Substance Use Topics   Alcohol use: Never   Drug use: Never     Allergies   Blue dyes (parenteral), Blueberry [vaccinium angustifolium], Raspberry, and Shellfish allergy   Review of Systems Review of Systems  Constitutional:  Negative for chills and fever.  HENT:  Negative for congestion, ear pain, sinus pressure and sore throat.   Eyes:  Negative for discharge and redness.  Respiratory:  Negative for cough, shortness of breath and wheezing.   Gastrointestinal:  Positive for nausea. Negative for abdominal pain, diarrhea and vomiting.  Musculoskeletal:  Positive for myalgias.  Neurological:  Positive for light-headedness and headaches.    Physical Exam Triage Vital Signs ED Triage Vitals  Enc Vitals Group     BP      Pulse      Resp      Temp      Temp src      SpO2      Weight      Height      Head Circumference      Peak Flow      Pain Score      Pain Loc      Pain Edu?      Excl. in [place]?    No data found.  Updated Vital Signs BP (!) 149/84 (BP Location: Left Arm)   Pulse 90   Temp 98.1 F (36.7 C) (Oral)   Ht 5' 9"  (1.753 m)   Wt 230 lb (104.3 kg)   SpO2 99%   BMI  33.97 kg/m      Physical Exam Vitals and nursing note reviewed.  Constitutional:      General: She is not in acute distress.    Appearance: Normal appearance. She is not ill-appearing.  HENT:     Head: Normocephalic and atraumatic.     Nose: Nose normal. No congestion or rhinorrhea.  Eyes:     Conjunctiva/sclera: Conjunctivae normal.  Cardiovascular:     Rate and Rhythm: Normal rate and regular rhythm.     Heart sounds: Normal heart sounds. No murmur heard. Pulmonary:     Effort: Pulmonary effort is normal. No respiratory distress.     Breath sounds: Normal breath sounds. No wheezing, rhonchi or rales.  Skin:    General: Skin is warm and dry.  Neurological:     Mental Status: She is alert.  Psychiatric:        Mood and Affect: Mood normal.        Thought Content: Thought content normal.     UC Treatments / Results  Labs (all labs ordered are listed, but only abnormal results are displayed) Labs Reviewed  POCT INFLUENZA A/B    EKG   Radiology No results found.  Procedures Procedures (including critical Gonzalez time)  Medications Ordered in UC Medications - No data to display  Initial Impression / Assessment and Plan / UC Course  I have reviewed the triage vital signs and the nursing notes.  Pertinent labs & imaging results that were available during my Gonzalez of the patient were reviewed by me and considered in my medical decision making (see chart for details).    Questionable flulike symptoms with negative flu test in office.  Discussed options for treatment with patient who would like to proceed with Tamiflu given current widespread influenza outbreak.  Recommended symptomatic treatment otherwise.  Encouraged patient to report to the emergency room if symptoms worsen anyway as I suspect she will need stat lab work at that time.  Final Clinical Impressions(s) / UC Diagnoses   Final diagnoses:  Flu-like symptoms   Discharge Instructions   None    ED  Prescriptions     Medication Sig Dispense Auth. Provider   oseltamivir (TAMIFLU) 75 MG capsule Take 1 capsule (75 mg total) by mouth every 12 (twelve) hours. 10 capsule Francene Finders, PA-C      PDMP not reviewed this encounter.   Francene Finders, PA-C 06/05/21 1649

## 2021-06-20 ENCOUNTER — Other Ambulatory Visit: Payer: Self-pay | Admitting: Licensed Clinical Social Worker

## 2021-06-20 NOTE — Patient Outreach (Addendum)
Medicaid Managed Care Social Work Note  06/20/2021 Name:  Jacqueline Gonzalez MRN:  [id number] DOB:  2000-08-18  Laporsche Hoeger is an 20 y.o. year old female who is a primary patient of [place], [place], DO.  The Medicaid Managed Care Coordination team was consulted for assistance with:  [place] and Resources  Jacqueline Gonzalez was given information about Medicaid Managed Care Coordination team services today. Jacqueline Gonzalez Patient agreed to services and verbal consent obtained.  Engaged with patient  for by telephone forfollow up visit in response to referral for case management and/or care coordination services.   Assessments/Interventions:  Review of past medical history, allergies, medications, health status, including review of consultants reports, laboratory and other test data, was performed as part of comprehensive evaluation and provision of chronic care management services.  SDOH: (Social Determinant of Health) assessments and interventions performed: SDOH Interventions    Flowsheet Row Most Recent Value  SDOH Interventions   Stress Interventions Offered [employer], Provide Counseling       Advanced Directives Status:  See Care Plan for related entries.  Care Plan                 Allergies  Allergen Reactions   Blue Dyes (Parenteral) Anaphylaxis   Blueberry [Vaccinium Angustifolium] Anaphylaxis   Raspberry Anaphylaxis   Shellfish Allergy Shortness Of Breath    ALL SHELLFISH    Medications Reviewed Today     Reviewed by Lynden Oxford (Physician Assistant Certified) on 36/64/40 at 1641  Med List Status: <None>   Medication Order Taking? Sig Documenting Provider Last Dose Status Informant  cyclobenzaprine (FLEXERIL) 5 MG tablet [id number] Yes Take 1 tablet (5 mg total) by mouth 2 (two) times daily as needed for muscle spasms. Kinnie Feil, MD 06/05/2021 Active   DULoxetine (CYMBALTA) 30 MG capsule [id number] Yes Take 1 capsule  (30 mg total) by mouth daily. Kinnie Feil, MD 06/05/2021 Active   ibuprofen (ADVIL) 600 MG tablet [id number] Yes Take 1 tablet (600 mg total) by mouth every 8 (eight) hours as needed. Kinnie Feil, MD 06/05/2021 Active   psyllium (METAMUCIL SMOOTH TEXTURE) 58.6 % powder [id number]  Take 1 packet by mouth daily.  Patient not taking: Reported on 05/02/2021   [name], MD  Active Self            Patient Active Problem List   Diagnosis Date Noted   Low back pain 05/02/2021   Headache 09/03/2020   Nonspecific abdominal pain 08/30/2020   Ganglion cyst of dorsum of left wrist 11/03/2019   Constipation 01/21/2018   Irregular periods 01/21/2018   Generalized anxiety disorder 01/21/2018    Conditions to be addressed/monitored per PCP order:  Anxiety  Care Plan : General Social Work (Adult)  Updates made by Greg Cutter, LCSW since 06/20/2021 12:00 AM     Problem: Anxiety Identification (Anxiety)   Note:   Timeframe:  Long-Range Goal Priority:  High Start Date:  05/14/21                        Expected End Date: ongoing                    Follow Up Date 05/27/21       Long-Range Goal: Anxiety Symptoms Identified   Start Date: 05/14/2021  Note:   Timeframe:  Long-Range Goal Priority:  High Start Date:  05/14/21  Expected End Date: ongoing                    Follow Up Date 07/04/21  Current barriers:   Chronic Mental Health needs related to anxiety, depression and sleeping troubles Mental Health Concerns  and Social Isolation Needs Support, Education, and Care Coordination in order to meet unmet mental health needs. Clinical Goal(s): demonstrate a reduction in symptoms related to :Anxiety with Excessive Worry, Panic Symptoms, and Insomnia/Sleep Difficulties  verbalize understanding of plan for management of Anxiety   Clinical Interventions:  Assessed patient's previous and current treatment, coping skills, support system and  barriers to care  Motivational Interviewing employed Depression screen reviewed  PHQ2/ PHQ9 completed Active listening / Reflection utilized  [place] strategies reviewed Provided psychoeducation for mental health needs  Participation in counseling encouraged  Crisis Resource Education / information provided  Suicidal Ideation/Homicidal Ideation assessed: [employer]  Discussed referral for psychiatry  Made referral to [facility] on 05/14/21  ; Review various resources, discussed options and provided patient information about  Options for mental health treatment based on need and insurance Patient is a 20 year old female that is experiencing anxiety, sleeping difficulties and depression. She is in need of therapy and medication management and is open to a referral to [facility]. She was educated on their walk in clinic as well and was encouraged to contact provider directly if she wishes to gain more information about their various programs. Patient declines any suicidal ideations.  [facility] LCSW made referral to [facility] on 05/14/21  ; UPDATE- Patient has a psychiatrist appointment om 08/01/20 and a counseling appointment on 12/522. Patient is unaware if these are virtual or in person appointments and advised her to contact [facility] to question this. Update 10 LITTLE Things To Do When You're Feeling Too Down To Do Anything  Take a shower. Even if you plan to stay in all day long and not see a soul, take a shower. It takes the most effort to hop in to the shower but once you do, you'll feel immediate results. It will wake you up and you'll be feeling much fresher (and cleaner too).  Brush and floss your teeth. Give your teeth a good brushing with a floss finish. It's a small task but it feels so good and you can check 'taking care of your health' off the list of things to do.  Do something small on your list. Most of [place] have some small thing we would like to get  done (load of laundry, sew a button, email a friend). Doing one of these things will make you feel like you've accomplished something.  Drink water. Drinking water is easy right? It's also really beneficial for your health so keep a glass beside you all day and take sips often. It gives you energy and prevents you from boredom eating.  Do some floor exercises. The last thing you want to do is exercise but it might be just the thing you need the most. Keep it simple and do exercises that involve sitting or laying on the floor. Even the smallest of exercises release chemicals in the brain that make you feel good. Yoga stretches or core exercises are going to make you feel good with minimal effort.  Make your bed. Making your bed takes a few minutes but it's productive and you'll feel relieved when it's done. An unmade bed is a huge visual reminder that you're having an  unproductive day. Do it and consider it your housework for the day.  Put on some nice clothes. Take the sweatpants off even if you don't plan to go anywhere. Put on clothes that make you feel good. Take a look in the mirror so your brain recognizes the sweatpants have been replaced with clothes that make you look great. It's an instant confidence booster.  Wash the dishes. A pile of dirty dishes in the sink is a reflection of your mood. It's possible that if you wash up the dishes, your mood will follow suit. It's worth a try.  Cook a real meal. If you have the luxury to have a "do nothing" day, you have time to make a real meal for yourself. Make a meal that you love to eat. The process is good to get you out of the funk and the food will ensure you have more energy for tomorrow.  Write out your thoughts by hand. When you hand write, you stimulate your brain to focus on the moment that you're in so make yourself comfortable and write whatever comes into your mind. Put those thoughts out on paper so they stop spinning around in your  head. Those thoughts might be the very thing holding you down. LCSW provided education on relaxation techniques such as meditation, deep breathing, massage, grounding exercsies or yoga that can activate the body's relaxation response and ease symptoms of stress and anxiety. LCSW ask that when pt is struggling with difficult emotions and racing thoughts that they start this relaxation response process. LCSW provided extensive education on healthy coping skills for anxiety. SW used active and reflective listening, validated patient's feelings/concerns, and provided emotional support. Inter-disciplinary care team collaboration (see longitudinal plan of care) Patient Goals/Self-Care Activities: Over the next 120 days connect with provider for ongoing mental health treatment.   Increase coping skills, healthy habits, self-management skills, and stress reduction       Follow up:  Patient agrees to Care Plan and Follow-up.  Plan: The Managed Medicaid care management team will reach out to the patient again over the next 60 days.  Date/time of next scheduled Social Work care management/care coordination outreach:  07/04/21  Eula Fried, BSW, MSW, LCSW Managed Medicaid LCSW [email or web address] Phone: [phone]

## 2021-06-21 NOTE — Patient Instructions (Signed)
Visit Information  Jacqueline Gonzalez was given information about Medicaid Managed Care team care coordination services as a part of their [employer] Medicaid benefit. [name] verbally consented to engagement with the [facility] Managed Care team.   If you are experiencing a medical emergency, please call 911 or report to your local emergency department or urgent care.   If you have a non-emergency medical problem during routine business hours, please contact your provider's office and ask to speak with a nurse.   For questions related to your Healthy [facility] health plan, please call: [phone] or visit the homepage here: [email or web address]  If you would like to schedule transportation through your Healthy [facility] plan, please call the following number at least 2 days in advance of your appointment: [phone]  Call the [place] at [phone], at any time, 24 hours a day, 7 days a week. If you are in danger or need immediate medical attention call 911.  If you would like help to quit smoking, call 1-800-QUIT-NOW 6672909275) OR Espaol: 1-855-Djelo-Ya (6-122-449-7530) o para ms informacin haga clic aqu or Text READY to 200-400 to register via text  Timeframe:  Long-Range Goal Priority:  High Start Date:  05/14/21                        Expected End Date: ongoing                    Follow Up Date 07/04/21  Current barriers:   Chronic Mental Health needs related to anxiety, depression and sleeping troubles Mental Health Concerns  and Social Isolation Needs Support, Education, and Care Coordination in order to meet unmet mental health needs. Clinical Goal(s): demonstrate a reduction in symptoms related to :Anxiety with Excessive Worry, Panic Symptoms, and Insomnia/Sleep Difficulties  verbalize understanding of plan for management of Anxiety    Eula Fried, BSW, MSW, [employer] Managed Medicaid LCSW [email or web address] Phone: [phone]

## 2021-06-23 ENCOUNTER — Other Ambulatory Visit: Payer: Self-pay

## 2021-06-23 ENCOUNTER — Ambulatory Visit (INDEPENDENT_AMBULATORY_CARE_PROVIDER_SITE_OTHER): Payer: Medicaid Other | Admitting: Clinical

## 2021-06-23 DIAGNOSIS — F331 Major depressive disorder, recurrent, moderate: Secondary | ICD-10-CM | POA: Diagnosis not present
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
## 2021-07-04 ENCOUNTER — Other Ambulatory Visit: Payer: Self-pay | Admitting: Licensed Clinical Social Worker

## 2021-07-04 NOTE — Patient Instructions (Signed)
Visit Information  Ms. Dicenso was given information about Medicaid Managed Care team care coordination services as a part of their Healthy [employer] Medicaid benefit. [name] verbally consented to engagement with the [facility] Managed Care team.   If you are experiencing a medical emergency, please call 911 or report to your local emergency department or urgent care.   If you have a non-emergency medical problem during routine business hours, please contact your provider's office and ask to speak with a nurse.   For questions related to your Healthy [facility] health plan, please call: [phone] or visit the homepage here: [email or web address]  If you would like to schedule transportation through your Healthy [facility] plan, please call the following number at least 2 days in advance of your appointment: [phone]  Call the [place] at [phone], at any time, 24 hours a day, 7 days a week. If you are in danger or need immediate medical attention call 911.  If you would like help to quit smoking, call 1-800-QUIT-NOW (706)810-5960) OR Espaol: 1-855-Djelo-Ya (1-572-620-3559) o para ms informacin haga clic aqu or Text READY to 200-400 to register via text   Following is a copy of your plan of care:  Care Plan : General Social Work (Adult)  Updates made by Greg Cutter, LCSW since 07/04/2021 12:00 AM     Problem: Anxiety Identification (Anxiety)   Note:   Timeframe:  Long-Range Goal Priority:  High Start Date:  05/14/21                        Expected End Date: ongoing                    Follow Up Date 05/27/21       Long-Range Goal: Anxiety Symptoms Identified   Start Date: 05/14/2021  Note:   Timeframe:  Long-Range Goal Priority:  High Start Date:  05/14/21                        Expected End Date: ongoing                    Follow Up Date -07/31/20  Current barriers:   Chronic Mental  Health needs related to anxiety, depression and sleeping troubles Mental Health Concerns  and Social Isolation Needs Support, Education, and Care Coordination in order to meet unmet mental health needs. Clinical Goal(s): demonstrate a reduction in symptoms related to :Anxiety with Excessive Worry, Panic Symptoms, and Insomnia/Sleep Difficulties  verbalize understanding of plan for management of Anxiety    10 LITTLE Things To Do When Youre Feeling Too Down To Do Anything  Take a shower. Even if you plan to stay in all day long and not see a soul, take a shower. It takes the most effort to hop in to the shower but once you do, youll feel immediate results. It will wake you up and youll be feeling much fresher (and cleaner too).  Brush and floss your teeth. Give your teeth a good brushing with a floss finish. Its a small task but it feels so good and you can check taking care of your health off the list of things to do.  Do something small on your list. Most of [place] have some small thing we would like to get done (load of laundry, sew a button, email a friend). Doing one of these things will make you feel like  youve accomplished something.  Drink water. Drinking water is easy right? Its also really beneficial for your health so keep a glass beside you all day and take sips often. It gives you energy and prevents you from boredom eating.  Do some floor exercises. The last thing you want to do is exercise but it might be just the thing you need the most. Keep it simple and do exercises that involve sitting or laying on the floor. Even the smallest of exercises release chemicals in the brain that make you feel good. Yoga stretches or core exercises are going to make you feel good with minimal effort.  Make your bed. Making your bed takes a few minutes but its productive and youll feel relieved when its done. An unmade bed is a huge visual reminder that youre having an unproductive day.  Do it and consider it your housework for the day.  Put on some nice clothes. Take the sweatpants off even if you dont plan to go anywhere. Put on clothes that make you feel good. Take a look in the mirror so your brain recognizes the sweatpants have been replaced with clothes that make you look great. Its an instant confidence booster.  Wash the dishes. A pile of dirty dishes in the sink is a reflection of your mood. Its possible that if you wash up the dishes, your mood will follow suit. Its worth a try.  Cook a real meal. If you have the luxury to have a do nothing day, you have time to make a real meal for yourself. Make a meal that you love to eat. The process is good to get you out of the funk and the food will ensure you have more energy for tomorrow.  Write out your thoughts by hand. When you hand write, you stimulate your brain to focus on the moment that youre in so make yourself comfortable and write whatever comes into your mind. Put those thoughts out on paper so they stop spinning around in your head. Those thoughts might be the very thing holding you down. LCSW provided education on relaxation techniques such as meditation, deep breathing, massage, grounding exercsies or yoga that can activate the body's relaxation response and ease symptoms of stress and anxiety. LCSW ask that when pt is struggling with difficult emotions and racing thoughts that they start this relaxation response process. LCSW provided extensive education on healthy coping skills for anxiety. SW used active and reflective listening, validated patient's feelings/concerns, and provided emotional support. Inter-disciplinary care team collaboration (see longitudinal plan of care) Patient Goals/Self-Care Activities: Over the next 120 days connect with provider for ongoing mental health treatment.   Increase coping skills, healthy habits, self-management skills, and stress reduction     24- Hour Availability:     [facility]  [address] [place], [place] [facility] [phone]   [facility] [employer] [phone]   [place]  [phone]    [place]  [phone] (after hours)   Therapeutic Alternative/Mobile Crisis   [phone]   [place] National Suicide Hotline  407-571-6258 (TALK) OR 988   Call 911 or go to emergency room   [facility]  [phone]);  [place] and [employer]  [phone]); [place], [place], [place], [place], [place], [place], [place]

## 2021-07-04 NOTE — Patient Outreach (Signed)
Medicaid Managed Care Social Work Note  07/04/2021 Name:  Jacqueline Gonzalez MRN:  [id number] DOB:  01/23/2001  Jacqueline Gonzalez is an 20 y.o. year old female who is a primary patient of [place], [place], DO.  The Medicaid Managed Care Coordination team was consulted for assistance with:  [place] and Resources  Ms. Dula was given information about Medicaid Managed Care Coordination team services today. [name] agreed to services and verbal consent obtained.  Engaged with patient  for by telephone forfollow up visit in response to referral for case management and/or care coordination services.   Assessments/Interventions:  Review of past medical history, allergies, medications, health status, including review of consultants reports, laboratory and other test data, was performed as part of comprehensive evaluation and provision of chronic care management services.  SDOH: (Social Determinant of Health) assessments and interventions performed: SDOH Interventions    Flowsheet Row Most Recent Value  SDOH Interventions   Stress Interventions Offered [employer] Resources       Advanced Directives Status:  See Care Plan for related entries.  Care Plan                 Allergies  Allergen Reactions   Blue Dyes (Parenteral) Anaphylaxis   Blueberry [Vaccinium Angustifolium] Anaphylaxis   Raspberry Anaphylaxis   Shellfish Allergy Shortness Of Breath    ALL SHELLFISH    Medications Reviewed Today     Reviewed by Lynden Oxford (Physician Assistant Certified) on 35/59/74 at 1641  Med List Status: <None>   Medication Order Taking? Sig Documenting Provider Last Dose Status Informant  cyclobenzaprine (FLEXERIL) 5 MG tablet [id number] Yes Take 1 tablet (5 mg total) by mouth 2 (two) times daily as needed for muscle spasms. Kinnie Feil, MD 06/05/2021 Active   DULoxetine (CYMBALTA) 30 MG capsule [id number] Yes Take 1 capsule (30 mg total) by  mouth daily. Kinnie Feil, MD 06/05/2021 Active   ibuprofen (ADVIL) 600 MG tablet [id number] Yes Take 1 tablet (600 mg total) by mouth every 8 (eight) hours as needed. Kinnie Feil, MD 06/05/2021 Active   psyllium (METAMUCIL SMOOTH TEXTURE) 58.6 % powder [id number]  Take 1 packet by mouth daily.  Patient not taking: Reported on 05/02/2021   [name], MD  Active Self            Patient Active Problem List   Diagnosis Date Noted   Major depressive disorder, recurrent episode, moderate with anxious distress ([place]) 06/23/2021   Low back pain 05/02/2021   Headache 09/03/2020   Nonspecific abdominal pain 08/30/2020   Ganglion cyst of dorsum of left wrist 11/03/2019   Constipation 01/21/2018   Irregular periods 01/21/2018   Generalized anxiety disorder 01/21/2018    Conditions to be addressed/monitored per PCP order:  Depression  Care Plan : General Social Work (Adult)  Updates made by Greg Cutter, LCSW since 07/04/2021 12:00 AM     Problem: Anxiety Identification (Anxiety)   Note:   Timeframe:  Long-Range Goal Priority:  High Start Date:  05/14/21                        Expected End Date: ongoing                    Follow Up Date 05/27/21       Long-Range Goal: Anxiety Symptoms Identified   Start Date: 05/14/2021  Note:   Timeframe:  Long-Range Goal Priority:  High Start Date:  05/14/21                        Expected End Date: ongoing                    Follow Up Date -07/31/20  Current barriers:   Chronic Mental Health needs related to anxiety, depression and sleeping troubles Mental Health Concerns  and Social Isolation Needs Support, Education, and Care Coordination in order to meet unmet mental health needs. Clinical Goal(s): demonstrate a reduction in symptoms related to :Anxiety with Excessive Worry, Panic Symptoms, and Insomnia/Sleep Difficulties  verbalize understanding of plan for management of Anxiety   Clinical Interventions:   Assessed patient's previous and current treatment, coping skills, support system and barriers to care  Motivational Interviewing employed Depression screen reviewed  PHQ2/ PHQ9 completed Active listening / Reflection utilized  [place] strategies reviewed Provided psychoeducation for mental health needs  Participation in counseling encouraged  Crisis Resource Education / information provided  Suicidal Ideation/Homicidal Ideation assessed: [place]  Discussed referral for psychiatry  Made referral to [facility] on 05/14/21  ; Review various resources, discussed options and provided patient information about  Options for mental health treatment based on need and insurance Patient is a 20 year old female that is experiencing anxiety, sleeping difficulties and depression. She is in need of therapy and medication management and is open to a referral to [facility]. She was educated on their walk in clinic as well and was encouraged to contact provider directly if she wishes to gain more information about their various programs. Patient declines any suicidal ideations.  [facility] LCSW made referral to [facility] on 05/14/21  ; UPDATE- Patient has a psychiatrist appointment on 08/01/21 and a counseling appointment on 06/23/21. Patient is unaware if these are virtual or in person appointments and advised her to contact [facility] to question this. Update 07/04/21- Patient reports that she is managing her mental health symptoms well and completed her first initial appointment with her counselor at [facility] on 06/23/21. Patient has not met with her psychiatrist yet as this appointment is not until 08/01/21. Patient has a follow up appointment with counselor scheduled as well.  10 LITTLE Things To Do When Youre Feeling Too Down To Do Anything  Take a shower. Even if you plan to stay in all day long and not see a soul, take a shower. It takes the most effort to hop in to the shower but  once you do, youll feel immediate results. It will wake you up and youll be feeling much fresher (and cleaner too).  Brush and floss your teeth. Give your teeth a good brushing with a floss finish. Its a small task but it feels so good and you can check taking care of your health off the list of things to do.  Do something small on your list. Most of [place] have some small thing we would like to get done (load of laundry, sew a button, email a friend). Doing one of these things will make you feel like youve accomplished something.  Drink water. Drinking water is easy right? Its also really beneficial for your health so keep a glass beside you all day and take sips often. It gives you energy and prevents you from boredom eating.  Do some floor exercises. The last thing you want to do is exercise but it might be just the thing you  need the most. Keep it simple and do exercises that involve sitting or laying on the floor. Even the smallest of exercises release chemicals in the brain that make you feel good. Yoga stretches or core exercises are going to make you feel good with minimal effort.  Make your bed. Making your bed takes a few minutes but its productive and youll feel relieved when its done. An unmade bed is a huge visual reminder that youre having an unproductive day. Do it and consider it your housework for the day.  Put on some nice clothes. Take the sweatpants off even if you dont plan to go anywhere. Put on clothes that make you feel good. Take a look in the mirror so your brain recognizes the sweatpants have been replaced with clothes that make you look great. Its an instant confidence booster.  Wash the dishes. A pile of dirty dishes in the sink is a reflection of your mood. Its possible that if you wash up the dishes, your mood will follow suit. Its worth a try.  Cook a real meal. If you have the luxury to have a do nothing day, you have time to make a real meal for  yourself. Make a meal that you love to eat. The process is good to get you out of the funk and the food will ensure you have more energy for tomorrow.  Write out your thoughts by hand. When you hand write, you stimulate your brain to focus on the moment that youre in so make yourself comfortable and write whatever comes into your mind. Put those thoughts out on paper so they stop spinning around in your head. Those thoughts might be the very thing holding you down. LCSW provided education on relaxation techniques such as meditation, deep breathing, massage, grounding exercsies or yoga that can activate the body's relaxation response and ease symptoms of stress and anxiety. LCSW ask that when pt is struggling with difficult emotions and racing thoughts that they start this relaxation response process. LCSW provided extensive education on healthy coping skills for anxiety. SW used active and reflective listening, validated patient's feelings/concerns, and provided emotional support. Inter-disciplinary care team collaboration (see longitudinal plan of care) Patient Goals/Self-Care Activities: Over the next 120 days connect with provider for ongoing mental health treatment.   Increase coping skills, healthy habits, self-management skills, and stress reduction     24- Hour Availability:    [facility]  [address] [place], [place] Crisis [phone]   [facility] [employer] [phone]   [place]  [phone]    [place]  [phone] (after hours)   Therapeutic Alternative/Mobile Crisis   [phone]   [place] National Suicide Hotline  727-733-4962 (TALK) OR 988   Call 911 or go to emergency room   [facility]  [phone]);  [place] and [employer]  [phone]); [place], [place], [place], [place], [place], [place], [place]              Follow up:  Patient agrees to Care Plan and Follow-up.  Plan: The Managed Medicaid care management team will reach out to the patient again over the next 30 days.  Date of next scheduled Social Work care management/care coordination outreach:  07/31/21  Eula Fried, BSW, MSW, LCSW Managed Medicaid LCSW [email or web address] Phone: [phone]

## 2021-07-23 ENCOUNTER — Ambulatory Visit (INDEPENDENT_AMBULATORY_CARE_PROVIDER_SITE_OTHER): Payer: Medicaid Other | Admitting: Family Medicine

## 2021-07-23 ENCOUNTER — Other Ambulatory Visit: Payer: Self-pay

## 2021-07-23 VITALS — BP 121/75 | HR 87 | Ht 69.0 in | Wt 245.0 lb

## 2021-07-23 DIAGNOSIS — R109 Unspecified abdominal pain: Secondary | ICD-10-CM | POA: Diagnosis not present

## 2021-07-23 DIAGNOSIS — K5904 Chronic idiopathic constipation: Secondary | ICD-10-CM

## 2021-07-23 LAB — POCT URINE PREGNANCY: Preg Test, Ur: NEGATIVE

## 2021-07-23 MED ORDER — SENNA 8.6 MG PO TABS: 1.0000 | ORAL_TABLET | Freq: Every day | ORAL | 0 refills | Status: DC

## 2021-07-23 MED ORDER — POLYETHYLENE GLYCOL 3350 17 GM/SCOOP PO POWD: 17.0000 g | Freq: Two times a day (BID) | ORAL | 3 refills | Status: DC

## 2021-07-23 NOTE — Patient Instructions (Addendum)
It was nice seeing you today!  Take Miralax 1-2 times a day. Take senna once a day.  Follow-up in 2 weeks to see how things are going.  Please arrive at least 15 minutes prior to your scheduled appointments.  Stay well, Zola Button, MD [place] [phone]

## 2021-07-23 NOTE — Progress Notes (Signed)
° ° °  SUBJECTIVE:   CHIEF COMPLAINT / HPI: Abdominal pain  Previously evaluated for nonspecific abdominal pain in February 2022, labs and urine studies were unremarkable at that time.  Felt to be possibly related to IBS, prescribed probiotic and fiber supplement as well as trial of sumatriptan hand for possible complex migraine component.  Patient reports severe abdominal pain in her lower abdomen which started 3 days ago associated with nausea and lightheadedness.  She had an episode of loose stool that day as well.  Pain has been constant but improved since initial onset.  Taking Tylenol with minimal relief.  Denies fever, chills, vomiting, urinary symptoms, vaginal discharge, vaginal bleeding.  She reports history of IBS and has infrequent bowel movements.  She has not had a BM since 3 days ago and prior to that, does not remember when her last BM was.  She has been passing gas.  She reports previously prescribed probiotic, fiber supplement, and sumatriptan did not help with her symptoms.  Episode feels similar to when she was evaluated in February but feels pain is more severe.  She is sexually active with her girlfriend.  She has had frequent bitemporal headaches for the past year, multiple times per week, takes Excedrin sparingly.  She does not like to take medications.  Headaches do not wake her up from sleep but will sometimes wake up with headache the following day.  Sleep schedule is erratic.  Denies smoking, alcohol use, recreational drug use.  PERTINENT  PMH / PSH: Irregular periods, depression, anxiety, constipation  OBJECTIVE:   BP 121/75    Pulse 87    Ht 5' 9"  (1.753 m)    Wt 245 lb (111.1 kg)    LMP 07/02/2021    SpO2 96%    BMI 36.18 kg/m   General: Obese young female, NAD Eyes: PERRL, EOMI CV: RRR, no murmurs Pulm: CTAB, no wheezes or rales Abdomen: Soft, mild lower abdominal tenderness without guarding  ASSESSMENT/PLAN:   Nonspecific abdominal pain Etiology is unclear,  suspect IBS and constipation are likely playing a role.  Abdominal exam is benign.  Urine pregnancy negative.  Prior imaging studies have revealed stool burden. - Miralax 1-2 times daily - senna daily - counseled on fiber intake, water intake, sleep hygiene - f/u 2 weeks     Zola Button, MD [place]

## 2021-07-24 ENCOUNTER — Encounter: Payer: Self-pay | Admitting: Family Medicine

## 2021-07-24 NOTE — Assessment & Plan Note (Signed)
Etiology is unclear, suspect IBS and constipation are likely playing a role.  Abdominal exam is benign.  Urine pregnancy negative.  Prior imaging studies have revealed stool burden. - Miralax 1-2 times daily - senna daily - counseled on fiber intake, water intake, sleep hygiene - f/u 2 weeks

## 2021-07-31 ENCOUNTER — Other Ambulatory Visit: Payer: Self-pay

## 2021-07-31 NOTE — Patient Instructions (Signed)
Visit Information  Jacqueline Gonzalez was given information about Medicaid Managed Care team care coordination services as a part of their Healthy [employer] Medicaid benefit. Jacqueline Gonzalez verbally consented to engagement with the [facility] Managed Care team.   If you are experiencing a medical emergency, please call 911 or report to your local emergency department or urgent care.   If you have a non-emergency medical problem during routine business hours, please contact your provider's office and ask to speak with a nurse.   For questions related to your Healthy [facility] health plan, please call: [phone] or visit the homepage here: [email or web address]  If you would like to schedule transportation through your Healthy [facility] plan, please call the following number at least 2 days in advance of your appointment: [phone]  Call the [place] at [phone], at any time, 24 hours a day, 7 days a week. If you are in danger or need immediate medical attention call 911.  If you would like help to quit smoking, call 1-800-QUIT-NOW 720-222-2021) OR Espaol: 1-855-Djelo-Ya (3-785-885-0277) o para ms informacin haga clic aqu or Text READY to 200-400 to register via text  Ms. Whittingham - following are the goals we discussed in your visit today:   Goals Addressed   None     The  Patient                                              has been provided with contact information for the Managed Medicaid care management team and has been advised to call with any health related questions or concerns.   Jacqueline Gonzalez, BSW, [place]  High Risk Managed Medicaid Team  [phone]   Following is a copy of your plan of care:  Care Plan : General Social Work (Adult)  Updates made by Ethelda Chick since 07/31/2021 12:00 AM     Problem: Anxiety Identification (Anxiety)   Note:   Timeframe:   Long-Range Goal Priority:  High Start Date:  05/14/21                        Expected End Date: ongoing                    Follow Up Date 05/27/21       Long-Range Goal: Anxiety Symptoms Identified   Start Date: 05/14/2021  Note:   Timeframe:  Long-Range Goal Priority:  High Start Date:  05/14/21                        Expected End Date: ongoing                    Follow Up Date -07/31/20  Current barriers:   Chronic Mental Health needs related to anxiety, depression and sleeping troubles Mental Health Concerns  and Social Isolation Needs Support, Education, and Care Coordination in order to meet unmet mental health needs. Clinical Goal(s): demonstrate a reduction in symptoms related to :Anxiety with Excessive Worry, Panic Symptoms, and Insomnia/Sleep Difficulties  verbalize understanding of plan for management of Anxiety   Clinical Interventions:  Assessed patient's previous and current treatment, coping skills, support system and barriers to care  Motivational Interviewing employed Depression screen reviewed  PHQ2/ PHQ9 completed Active  listening / Reflection utilized  [place] reviewed Provided psychoeducation for mental health needs  Participation in counseling encouraged  Crisis Resource Education / information provided  Suicidal Ideation/Homicidal Ideation assessed: [place]  Discussed referral for psychiatry  Made referral to [facility] on 05/14/21  ; Review various resources, discussed options and provided patient information about  Options for mental health treatment based on need and insurance Patient is a 21 year old female that is experiencing anxiety, sleeping difficulties and depression. She is in need of therapy and medication management and is open to a referral to [facility]. She was educated on their walk in clinic as well and was encouraged to contact provider directly if she wishes to gain more  information about their various programs. Patient declines any suicidal ideations.  [facility] LCSW made referral to [facility] on 05/14/21  ; UPDATE- Patient has a psychiatrist appointment on 08/01/21 and a counseling appointment on 06/23/21. Patient is unaware if these are virtual or in person appointments and advised her to contact [facility] to question this. Update 07/04/21- Patient reports that she is managing her mental health symptoms well and completed her first initial appointment with her counselor at [facility] on 06/23/21. Patient has not met with her psychiatrist yet as this appointment is not until 08/01/21. Patient has a follow up appointment with counselor scheduled as well.  07/31/21: BSW completed telephone call with patient. She does have her appointment on 08/01/21. Patient does not have any questions and not resources needed at this time.  10 LITTLE Things To Do When Youre Feeling Too Down To Do Anything  Take a shower. Even if you plan to stay in all day long and not see a soul, take a shower. It takes the most effort to hop in to the shower but once you do, youll feel immediate results. It will wake you up and youll be feeling much fresher (and cleaner too).  Brush and floss your teeth. Give your teeth a good brushing with a floss finish. Its a small task but it feels so good and you can check taking care of your health off the list of things to do.  Do something small on your list. Most of [place] have some small thing we would like to get done (load of laundry, sew a button, email a friend). Doing one of these things will make you feel like youve accomplished something.  Drink water. Drinking water is easy right? Its also really beneficial for your health so keep a glass beside you all day and take sips often. It gives you energy and prevents you from boredom eating.  Do some floor exercises. The last thing you want to do is exercise but it might be just the thing you need the most. Keep it simple  and do exercises that involve sitting or laying on the floor. Even the smallest of exercises release chemicals in the brain that make you feel good. Yoga stretches or core exercises are going to make you feel good with minimal effort.  Make your bed. Making your bed takes a few minutes but its productive and youll feel relieved when its done. An unmade bed is a huge visual reminder that youre having an unproductive day. Do it and consider it your housework for the day.  Put on some nice clothes. Take the sweatpants off even if you dont plan to go anywhere. Put on clothes that make you feel good. Take a look in  the mirror so your brain recognizes the sweatpants have been replaced with clothes that make you look great. Its an instant confidence booster.  Wash the dishes. A pile of dirty dishes in the sink is a reflection of your mood. Its possible that if you wash up the dishes, your mood will follow suit. Its worth a try.  Cook a real meal. If you have the luxury to have a do nothing day, you have time to make a real meal for yourself. Make a meal that you love to eat. The process is good to get you out of the funk and the food will ensure you have more energy for tomorrow.  Write out your thoughts by hand. When you hand write, you stimulate your brain to focus on the moment that youre in so make yourself comfortable and write whatever comes into your mind. Put those thoughts out on paper so they stop spinning around in your head. Those thoughts might be the very thing holding you down. LCSW provided education on relaxation techniques such as meditation, deep breathing, massage, grounding exercsies or yoga that can activate the body's relaxation response and ease symptoms of stress and anxiety. LCSW ask that when pt is struggling with difficult emotions and racing thoughts that they start this relaxation response process. LCSW provided extensive education on healthy coping skills for  anxiety. SW used active and reflective listening, validated patient's feelings/concerns, and provided emotional support. Inter-disciplinary care team collaboration (see longitudinal plan of care) Patient Goals/Self-Care Activities: Over the next 120 days connect with provider for ongoing mental health treatment.   Increase coping skills, healthy habits, self-management skills, and stress reduction     24- Hour Availability:    [facility]  [address] [place], [place] Crisis [phone]   [facility] [employer] [phone]   [place]  [phone]    [place]  [phone] (after hours)   Therapeutic Alternative/Mobile Crisis   [phone]   [place] National Suicide Hotline  [phone] (TALK) OR 988   Call 911 or go to emergency room   [facility]  [phone]);  [place] and [employer]  [phone]); [place], [place], [place], [place], [place], [place], [place]

## 2021-07-31 NOTE — Patient Outreach (Signed)
Medicaid Managed Care Social Work Note  07/31/2021 Name:  Jacqueline Gonzalez MRN:  [id number] DOB:  November 13, 2000  Jacqueline Gonzalez is an 21 y.o. year old female who is a primary patient of [place], Bayard, DO.  The Medicaid Managed Care Coordination team was consulted for assistance with:  [place] and Resources  Jacqueline Gonzalez was given information about Medicaid Managed Care Coordination team services today. [name] agreed to services and verbal consent obtained.  Engaged with patient  for by telephone forfollow up visit in response to referral for case management and/or care coordination services.   Assessments/Interventions:  Review of past medical history, allergies, medications, health status, including review of consultants reports, laboratory and other test data, was performed as part of comprehensive evaluation and provision of chronic care management services.  SDOH: (Social Determinant of Health) assessments and interventions performed: BSW completed telephone call with patient. She does have her appointment on 08/01/21. Patient does not have any questions and not resources needed at this time.    Advanced Directives Status:  Not addressed in this encounter.  Care Plan                 Allergies  Allergen Reactions   Blue Dyes (Parenteral) Anaphylaxis   Blueberry [Vaccinium Angustifolium] Anaphylaxis   Raspberry Anaphylaxis   Shellfish Allergy Shortness Of Breath    ALL SHELLFISH    Medications Reviewed Today     Reviewed by Zola Button, MD (Resident) on 07/24/21 at 1204  Med List Status: <None>   Medication Order Taking? Sig Documenting Provider Last Dose Status Informant  Patient not taking:  Discontinued 07/24/21 1204   DULoxetine (CYMBALTA) 30 MG capsule [id number] No Take 1 capsule (30 mg total) by mouth daily.  Patient not taking: Reported on 07/23/2021   Kinnie Feil, MD Not Taking Active   polyethylene glycol powder (GLYCOLAX/MIRALAX)  17 GM/SCOOP powder [id number] Yes Take 17 g by mouth in the morning and at bedtime. Zola Button, MD  Active    Patient not taking:   Discontinued 07/24/21 1204 senna (SENOKOT) 8.6 MG TABS tablet [id number] Yes Take 1 tablet (8.6 mg total) by mouth daily. Zola Button, MD  Active             Patient Active Problem List   Diagnosis Date Noted   Major depressive disorder, recurrent episode, moderate with anxious distress ([place]) 06/23/2021   Low back pain 05/02/2021   Headache 09/03/2020   Nonspecific abdominal pain 08/30/2020   Ganglion cyst of dorsum of left wrist 11/03/2019   Constipation 01/21/2018   Irregular periods 01/21/2018   Generalized anxiety disorder 01/21/2018    Conditions to be addressed/monitored per PCP order:  Anxiety  Care Plan : General Social Work (Adult)  Updates made by Ethelda Chick since 07/31/2021 12:00 AM     Problem: Anxiety Identification (Anxiety)   Note:   Timeframe:  Long-Range Goal Priority:  High Start Date:  05/14/21                        Expected End Date: ongoing                    Follow Up Date 05/27/21       Long-Range Goal: Anxiety Symptoms Identified   Start Date: 05/14/2021  Note:   Timeframe:  Long-Range Goal Priority:  High Start Date:  05/14/21  Expected End Date: ongoing                    Follow Up Date -07/31/20  Current barriers:   Chronic Mental Health needs related to anxiety, depression and sleeping troubles Mental Health Concerns  and Social Isolation Needs Support, Education, and Care Coordination in order to meet unmet mental health needs. Clinical Goal(s): demonstrate a reduction in symptoms related to :Anxiety with Excessive Worry, Panic Symptoms, and Insomnia/Sleep Difficulties  verbalize understanding of plan for management of Anxiety   Clinical Interventions:  Assessed patient's previous and current treatment, coping skills, support system and barriers to care  Motivational  Interviewing employed Depression screen reviewed  PHQ2/ PHQ9 completed Active listening / Reflection utilized  [place] strategies reviewed Provided psychoeducation for mental health needs  Participation in counseling encouraged  Crisis Resource Education / information provided  Suicidal Ideation/Homicidal Ideation assessed: [place] of Attorney  Discussed referral for psychiatry  Made referral to [facility] on 05/14/21  ; Review various resources, discussed options and provided patient information about  Options for mental health treatment based on need and insurance Patient is a 21 year old female that is experiencing anxiety, sleeping difficulties and depression. She is in need of therapy and medication management and is open to a referral to [facility]. She was educated on their walk in clinic as well and was encouraged to contact provider directly if she wishes to gain more information about their various programs. Patient declines any suicidal ideations.  [facility] LCSW made referral to [facility] on 05/14/21  ; UPDATE- Patient has a psychiatrist appointment on 08/01/21 and a counseling appointment on 06/23/21. Patient is unaware if these are virtual or in person appointments and advised her to contact [facility] to question this. Update 07/04/21- Patient reports that she is managing her mental health symptoms well and completed her first initial appointment with her counselor at [facility] on 06/23/21. Patient has not met with her psychiatrist yet as this appointment is not until 08/01/21. Patient has a follow up appointment with counselor scheduled as well.  07/31/21: BSW completed telephone call with patient. She does have her appointment on 08/01/21. Patient does not have any questions and not resources needed at this time.  10 LITTLE Things To Do When Youre Feeling Too Down To Do Anything  Take a shower. Even if you plan to stay in all day long and not see a soul, take a shower. It  takes the most effort to hop in to the shower but once you do, youll feel immediate results. It will wake you up and youll be feeling much fresher (and cleaner too).  Brush and floss your teeth. Give your teeth a good brushing with a floss finish. Its a small task but it feels so good and you can check taking care of your health off the list of things to do.  Do something small on your list. Most of [place] have some small thing we would like to get done (load of laundry, sew a button, email a friend). Doing one of these things will make you feel like youve accomplished something.  Drink water. Drinking water is easy right? Its also really beneficial for your health so keep a glass beside you all day and take sips often. It gives you energy and prevents you from boredom eating.  Do some floor exercises. The last thing you want to do is exercise but it might be just the thing you  need the most. Keep it simple and do exercises that involve sitting or laying on the floor. Even the smallest of exercises release chemicals in the brain that make you feel good. Yoga stretches or core exercises are going to make you feel good with minimal effort.  Make your bed. Making your bed takes a few minutes but its productive and youll feel relieved when its done. An unmade bed is a huge visual reminder that youre having an unproductive day. Do it and consider it your housework for the day.  Put on some nice clothes. Take the sweatpants off even if you dont plan to go anywhere. Put on clothes that make you feel good. Take a look in the mirror so your brain recognizes the sweatpants have been replaced with clothes that make you look great. Its an instant confidence booster.  Wash the dishes. A pile of dirty dishes in the sink is a reflection of your mood. Its possible that if you wash up the dishes, your mood will follow suit. Its worth a try.  Cook a real meal. If you have the luxury to have a do  nothing day, you have time to make a real meal for yourself. Make a meal that you love to eat. The process is good to get you out of the funk and the food will ensure you have more energy for tomorrow.  Write out your thoughts by hand. When you hand write, you stimulate your brain to focus on the moment that youre in so make yourself comfortable and write whatever comes into your mind. Put those thoughts out on paper so they stop spinning around in your head. Those thoughts might be the very thing holding you down. LCSW provided education on relaxation techniques such as meditation, deep breathing, massage, grounding exercsies or yoga that can activate the body's relaxation response and ease symptoms of stress and anxiety. LCSW ask that when pt is struggling with difficult emotions and racing thoughts that they start this relaxation response process. LCSW provided extensive education on healthy coping skills for anxiety. SW used active and reflective listening, validated patient's feelings/concerns, and provided emotional support. Inter-disciplinary care team collaboration (see longitudinal plan of care) Patient Goals/Self-Care Activities: Over the next 120 days connect with provider for ongoing mental health treatment.   Increase coping skills, healthy habits, self-management skills, and stress reduction     24- Hour Availability:    [facility]  [address] [place], [place] [facility] [phone]   [facility] [employer] [phone]   [facility]  [phone]    [place]  [phone] (after hours)   Therapeutic Alternative/Mobile Crisis   [phone]   [place] National Suicide Hotline  5034764384 (TALK) OR 988   Call 911 or go to emergency room   [facility]  [phone]);  [place] and [employer]  [phone]); [place], [place], [place],  [place], [place], [place], [place]             Follow up:  Patient agrees to Care Plan and Follow-up.  Plan: The  Patient has been provided with contact information for the Managed Medicaid care management team and has been advised to call with any health related questions or concerns.    Mickel Fuchs, BSW, [place] Managed Medicaid Team  [phone]

## 2021-08-01 ENCOUNTER — Other Ambulatory Visit: Payer: Self-pay

## 2021-08-01 ENCOUNTER — Ambulatory Visit (INDEPENDENT_AMBULATORY_CARE_PROVIDER_SITE_OTHER): Payer: Medicaid Other | Admitting: Physician Assistant

## 2021-08-01 ENCOUNTER — Encounter (HOSPITAL_COMMUNITY): Payer: Self-pay | Admitting: Physician Assistant

## 2021-08-01 DIAGNOSIS — F411 Generalized anxiety disorder: Secondary | ICD-10-CM | POA: Diagnosis not present

## 2021-08-01 DIAGNOSIS — F331 Major depressive disorder, recurrent, moderate: Secondary | ICD-10-CM | POA: Diagnosis not present

## 2021-08-01 MED ORDER — FLUOXETINE HCL 10 MG PO CAPS: 10.0000 mg | ORAL_CAPSULE | Freq: Every day | ORAL | 1 refills | Status: DC

## 2021-08-01 MED ORDER — HYDROXYZINE HCL 10 MG PO TABS
10.0000 mg | ORAL_TABLET | Freq: Three times a day (TID) | ORAL | 1 refills | Status: DC | PRN
Start: 2021-08-01 — End: 2021-11-13
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
## 2021-08-05 ENCOUNTER — Telehealth: Payer: Self-pay

## 2021-08-05 ENCOUNTER — Telehealth: Payer: Medicaid Other | Admitting: Physician Assistant

## 2021-08-05 DIAGNOSIS — U071 COVID-19: Secondary | ICD-10-CM

## 2021-08-05 MED ORDER — NAPROXEN 500 MG PO TABS: 500.0000 mg | ORAL_TABLET | Freq: Two times a day (BID) | ORAL | 0 refills | Status: DC

## 2021-08-05 MED ORDER — FLUTICASONE PROPIONATE 50 MCG/ACT NA SUSP: 2.0000 | Freq: Every day | NASAL | 0 refills | Status: DC

## 2021-08-05 MED ORDER — BENZONATATE 100 MG PO CAPS
100.0000 mg | ORAL_CAPSULE | Freq: Three times a day (TID) | ORAL | 0 refills | Status: DC | PRN
Start: 2021-08-05 — End: 2021-10-19

## 2021-08-05 MED ORDER — ALBUTEROL SULFATE HFA 108 (90 BASE) MCG/ACT IN AERS: 2.0000 | INHALATION_SPRAY | Freq: Four times a day (QID) | RESPIRATORY_TRACT | 0 refills | Status: DC | PRN

## 2021-08-05 NOTE — Telephone Encounter (Signed)
BPA triggered for worsening symptoms nausea and decreased appetite. Pt states she tested positive for COVID on Sunday. Advised her to drink plenty of fluids and eat bland diet in small portions. Explained to pt she can call PCP to get something prescribed for nausea if symptoms don't improve. She verbalized understanding.

## 2021-08-05 NOTE — Progress Notes (Signed)
E-Visit  for Positive Covid Test Result  We are sorry you are not feeling well. We are here to help!  You have tested positive for COVID-19, meaning that you were infected with the novel coronavirus and could give the virus to others.  It is vitally important that you stay home so you do not spread it to others.      Please continue isolation at home, for at least 10 days since the start of your symptoms and until you have had 24 hours with no fever (without taking a fever reducer) and with improving of symptoms.  If you have no symptoms but tested positive (or all symptoms resolve after 5 days and you have no fever) you can leave your house but continue to wear a mask around others for an additional 5 days. If you have a fever,continue to stay home until you have had 24 hours of no fever. Most cases improve 5-10 days from onset but we have seen a small number of patients who have gotten worse after the 10 days.  Please be sure to watch for worsening symptoms and remain taking the proper precautions.   Go to the nearest hospital ED for assessment if fever/cough/breathlessness are severe or illness seems like a threat to life.    The following symptoms may appear 2-14 days after exposure: Fever Cough Shortness of breath or difficulty breathing Chills Repeated shaking with chills Muscle pain Headache Sore throat New loss of taste or smell Fatigue Congestion or runny nose Nausea or vomiting Diarrhea  You have been enrolled in [facility] for COVID-19. Daily you will receive a questionnaire within the [facility] website. Our COVID-19 response team will be monitoring your responses daily.  You can use medication such as prescription cough medication called Tessalon Perles 100 mg. You may take 1-2 capsules every 8 hours as needed for cough,  prescription inhaler called Albuterol MDI 90 mcg /actuation 2 puffs every 4 hours as needed for shortness of breath, wheezing, cough, prescription  anti-inflammatory called Naprosyn 500 mg. Take twice daily as needed for fever or body aches for 2 weeks, and prescription for Fluticasone nasal spray 2 sprays in each nostril one time per day  You may also take acetaminophen (Tylenol) as needed for fever.  HOME CARE: Only take medications as instructed by your medical team. Drink plenty of fluids and get plenty of rest. A steam or ultrasonic humidifier can help if you have congestion.   GET HELP RIGHT AWAY IF YOU HAVE EMERGENCY WARNING SIGNS.  Call 911 or proceed to your closest emergency facility if: You develop worsening high fever. Trouble breathing Bluish lips or face Persistent pain or pressure in the chest New confusion Inability to wake or stay awake You cough up blood. Your symptoms become more severe Inability to hold down food or fluids  This list is not all possible symptoms. Contact your medical provider for any symptoms that are severe or concerning to you.    Your e-visit answers were reviewed by a board certified advanced clinical practitioner to complete your personal care plan.  Depending on the condition, your plan could have included both over the counter or prescription medications.  If there is a problem please reply once you have received a response from your provider.  Your safety is important to [place].  If you have drug allergies check your prescription carefully.    You can use MyChart to ask questions about today's visit, request a non-urgent call back, or  ask for a work or school excuse for 24 hours related to this e-Visit. If it has been greater than 24 hours you will need to follow up with your provider, or enter a new e-Visit to address those concerns. You will get an e-mail in the next two days asking about your experience.  I hope that your e-visit has been valuable and will speed your recovery. Thank you for using e-visits.  I provided 5 minutes of non face-to-face time during this encounter for chart  review and documentation.

## 2021-08-06 ENCOUNTER — Telehealth: Payer: Self-pay

## 2021-08-06 NOTE — Telephone Encounter (Signed)
BPA triggered for worsening symptoms cough. Patient called says the cough hurts her chest and throat more. She says she's taking the medication that was prescribed and it's not helping. She says she's coughing up clear and yellow sputum. Advised if has CP without coughing to go to the ED, advised to contact PCP for evaluation. Patient verbalized understanding of above and below advice.    If cough remains the same or better: continue to treat with over the counter medications.  Hard candy or cough drops and drinking warm fluids. Adults can also use honey 2 tsp (10 ML) at bedtime.   If cough is becoming worse even with the use of over the counter medications and patient is not able to sleep at night, cough becomes productive with sputum that maybe yellow or green in color, contact PCP.

## 2021-08-12 ENCOUNTER — Telehealth (INDEPENDENT_AMBULATORY_CARE_PROVIDER_SITE_OTHER): Payer: Medicaid Other | Admitting: Family Medicine

## 2021-08-12 ENCOUNTER — Encounter: Payer: Self-pay | Admitting: Family Medicine

## 2021-08-12 DIAGNOSIS — R4586 Emotional lability: Secondary | ICD-10-CM

## 2021-08-12 NOTE — Progress Notes (Signed)
Virtual Visit via Telephone Note  I connected with Jacqueline Gonzalez on 08/12/21 at  3:10 PM EST by telephone and verified that I am speaking with the correct person using two identifiers.  Location: Patient: Jacqueline Gonzalez  Provider: Donney Dice, DO   I discussed the limitations, risks, security and privacy concerns of performing an evaluation and management service by telephone and the availability of in person appointments. I also discussed with the patient that there may be a patient responsible charge related to this service. The patient expressed understanding and agreed to proceed.   History of Present Illness: Presents with intermittent decreased mood changes that she feels is due to COVID since she was stuck isolation. Mood typically fluctuates depending on the day but hard to say if she is actually improved since she developed COVID soon after her last visit. Following up with behavioral health, still awaiting therapy appointment which is scheduled for next week. Screening has been completed and prescribed prozac. Has a follow up in a few weeks but will be seeing the therapist next week. Denies any thoughts of harming self or others.    Observations/Objective: General: Patient seems calm and speaks in clear sentences. Resp: normal work of breathing noted Psych: denies SI or HI   Assessment and Plan: -continue to maintain routine follow up with behavioral health -suicide hotline given if patient experiences suicidal ideations  -continue prozac as prescribed   Follow Up Instructions: -follow up soon for physical -rest and fluids for COVID   I discussed the assessment and treatment plan with the patient. The patient was provided an opportunity to ask questions and all were answered. The patient agreed with the plan and demonstrated an understanding of the instructions.   The patient was advised to call back or seek an in-person evaluation if the symptoms worsen or if the  condition fails to improve as anticipated.  I provided 15 minutes of non-face-to-face time during this encounter.   Donney Dice, DO

## 2021-08-14 ENCOUNTER — Telehealth: Payer: Medicaid Other | Admitting: Physician Assistant

## 2021-08-14 DIAGNOSIS — R197 Diarrhea, unspecified: Secondary | ICD-10-CM

## 2021-08-14 DIAGNOSIS — R1110 Vomiting, unspecified: Secondary | ICD-10-CM | POA: Diagnosis not present

## 2021-08-14 MED ORDER — ONDANSETRON 4 MG PO TBDP: 4.0000 mg | ORAL_TABLET | Freq: Three times a day (TID) | ORAL | 0 refills | Status: DC | PRN

## 2021-08-14 NOTE — Progress Notes (Signed)
We are sorry that you are not feeling well.  Here is how we plan to help!  Based on what you have shared with me it looks like you have Acute Infectious Diarrhea.  Most cases of acute diarrhea are due to infections with virus and bacteria and are self-limited conditions lasting less than 14 days.  For your symptoms you may take Imodium 2 mg tablets that are over the counter at your local pharmacy. Take two tablet now and then one after each loose stool up to 6 a day.  Antibiotics are not needed for most people with diarrhea.  Optional: Zofran 4 mg 1 tablet every 8 hours as needed for nausea and vomiting   HOME CARE We recommend changing your diet to help with your symptoms for the next few days. Drink plenty of fluids that contain water salt and sugar. Sports drinks such as Gatorade may help.  You may try broths, soups, bananas, applesauce, soft breads, mashed potatoes or crackers.  You are considered infectious for as long as the diarrhea continues. Hand washing or use of alcohol based hand sanitizers is recommend. It is best to stay out of work or school until your symptoms stop.   GET HELP RIGHT AWAY If you have dark yellow colored urine or do not pass urine frequently you should drink more fluids.   If your symptoms worsen  If you feel like you are going to pass out (faint) You have a new problem  MAKE SURE YOU  Understand these instructions. Will watch your condition. Will get help right away if you are not doing well or get worse.  Thank you for choosing an e-visit.  Your e-visit answers were reviewed by a board certified advanced clinical practitioner to complete your personal care plan. Depending upon the condition, your plan could have included both over the counter or prescription medications.  Please review your pharmacy choice. Make sure the pharmacy is open so you can pick up prescription now. If there is a problem, you may contact your provider through [employer] and have the prescription routed to another pharmacy.  Your safety is important to [place]. If you have drug allergies check your prescription carefully.   For the next 24 hours you can use MyChart to ask questions about today's visit, request a non-urgent call back, or ask for a work or school excuse. You will get an email in the next two days asking about your experience. I hope that your e-visit has been valuable and will speed your recovery.

## 2021-08-14 NOTE — Progress Notes (Signed)
I have spent 5 minutes in review of e-visit questionnaire, review and updating patient chart, medical decision making and response to patient.   Ysabel Stankovich Cody Fareedah Mahler, PA-C    

## 2021-08-19 ENCOUNTER — Ambulatory Visit (INDEPENDENT_AMBULATORY_CARE_PROVIDER_SITE_OTHER): Payer: Medicaid Other | Admitting: Clinical

## 2021-08-19 ENCOUNTER — Other Ambulatory Visit: Payer: Self-pay

## 2021-08-19 DIAGNOSIS — F331 Major depressive disorder, recurrent, moderate: Secondary | ICD-10-CM | POA: Diagnosis not present

## 2021-08-20 ENCOUNTER — Telehealth: Payer: Self-pay | Admitting: Family Medicine

## 2021-08-20 NOTE — Progress Notes (Signed)
° °  THERAPIST PROGRESS NOTE  Session Time: 25 minutes  Participation Level: Active  Behavioral Response: CasualAlertAnxious  Type of Therapy: Individual Therapy  Treatment Goals addressed: Coping  Interventions: CBT and Supportive  Summary:  Jacqueline Gonzalez is a 21 y.o. female who presents for the scheduled session oriented times five, appropriately dressed, and friendly. Client denied hallucinations and delusions. Client reported on today she has been managing fair. Client reported she has not started the medications prescribed by the psychiatrist due to hesitation. Client reported she has been having depression and anxiety symptoms. Client reported she gets in her head to much which causes her to second guess her choices and then loses interests in what she is set out to do. Client reported her holidays went "okay". Client reported she does not like talking about her personal business and was not engaged in prompted discussion by the therapist.     Suicidal/Homicidal: Nowithout intent/plan  Therapist Response:  Therapist began the session asking the client how she has been doing since last seen. Therapist used CBT to utilize active listening and positive emotional support. Therapist used CBT to ask the client her thoughts and origin of her negative emotions. Therapist used CBT to ask the client about medication compliance compared to ongoing emotions. Therapist used CBT to ask the client about her motivation for change. Therapist assigned the client homework to brainstorm her goals for therapy. Client was scheduled for next appointment.    Plan: Return again in 5 weeks.  Diagnosis: Moderate episode of recurrent major depressive disorder   Aneudy Champlain Y Daril Warga, LCSW 08/19/2021

## 2021-08-20 NOTE — Telephone Encounter (Signed)
..  Patient declines further follow up and engagement by the Managed Medicaid Team. Appropriate care team members and provider have been notified via electronic communication. The Managed Medicaid Team is available to follow up with the patient after provider conversation with the patient regarding recommendation for engagement and subsequent re-referral to the Managed Medicaid Team.    Jennifer Alley Care Guide, High Risk Medicaid Managed Care Embedded Care Coordination [place]  [facility]   

## 2021-08-27 ENCOUNTER — Other Ambulatory Visit: Payer: Self-pay

## 2021-08-27 ENCOUNTER — Ambulatory Visit (INDEPENDENT_AMBULATORY_CARE_PROVIDER_SITE_OTHER): Payer: Medicaid Other | Admitting: Family Medicine

## 2021-08-27 ENCOUNTER — Encounter: Payer: Self-pay | Admitting: Family Medicine

## 2021-08-27 VITALS — BP 125/70 | HR 82 | Ht 69.0 in | Wt 242.2 lb

## 2021-08-27 DIAGNOSIS — R109 Unspecified abdominal pain: Secondary | ICD-10-CM | POA: Diagnosis not present

## 2021-08-27 LAB — POCT URINE PREGNANCY: Preg Test, Ur: NEGATIVE

## 2021-08-27 MED ORDER — FAMOTIDINE 20 MG PO TABS: 20.0000 mg | ORAL_TABLET | Freq: Two times a day (BID) | ORAL | 0 refills | Status: DC

## 2021-08-27 NOTE — Progress Notes (Signed)
SUBJECTIVE:   CHIEF COMPLAINT / HPI:   "Stomach pain": 21 year old female presenting for stomach pain for about 4 days. Was previously seen on 07/23/2021 with the same with the thought that constipation may be playing a role.  It was noted she had a negative urine pregnancy and benign abdominal exam at that time with prior imaging revealing stool burden.  She was prescribed MiraLAX 1-2 times daily as well as senna daily and counseled on fiber and water intake with recommended follow-up in 2 weeks.  Reviewed imaging with abdominal x-ray in April 2021 showing moderate right colonic and rectal stool. Today she states that on Sunday she woke up with a sharp pain in her upper stomach. She states it has been constant since Sunday. She has tried cold showers and heating pads on the stomach and none have really made a big difference. She has missed school all this week. She thinks it may possible be worse after eating. She had covid about 2 weeks ago but wasn't having this kind of abdominal pain at that time. She has had some nausea off and on since Covid19. She has had some diarrhea during this episode and no solid stool for a week or longer. She had ~10 bouts of diarrhea on Monday and has had about 1 bout per day since then. She has not been taking miralax and senna the past week or two.   PERTINENT  PMH / PSH: History of constipation  OBJECTIVE:   BP 125/70    Pulse 82    Ht 5' 9"  (1.753 m)    Wt 242 lb 3.2 oz (109.9 kg)    LMP 08/19/2021    SpO2 100%    BMI 35.77 kg/m    General: NAD, pleasant, able to participate in exam Cardiac: RRR, no murmurs. Respiratory: CTAB, normal effort, No wheezes, rales or rhonchi Abdomen: Bowel sounds present, no suprapubic discomfort, no lower abdominal discomfort on palpation.  She does have epigastric and right upper quadrant/left upper quadrant discomfort to palpation with no acute surgical findings.  Negative Murphy sign, no hepatosplenomegaly Psych: Normal affect  and mood  ASSESSMENT/PLAN:    Abdominal discomfort: 21 year old female with abdominal discomfort in the setting of 2-3 days of diarrhea which occurred 2 weeks after COVID.  Previous imaging showing stool burden and she was seen 2 weeks ago with recommendation to increase MiraLAX and senna daily which she has not been taking the past week or 2.  Physical exam today overall reassuring with some mild-moderate abdominal discomfort to palpation in the upper region with negative Murphy sign and no suprapubic or lower abdominal discomfort.  Differential can include abdominal discomfort due to gastroenteritis with recent diarrhea, or reflux/GERD.  She did state that it was possible the abdominal discomfort was worse after eating and it did wake her up from sleep which could suggest reflux as a cause.  Constipation is also a possibility even though she was having up to 10 bouts of stool on Sunday and Monday as this may be from overflow stooling with her history of constipation in the setting of not taking MiraLAX/senna.  Negative pregnancy test today.  Low concern for PID given the location of the abdominal discomfort.  Recommended a trial of Pepcid as well as continuing on her MiraLAX/senna.  Discussed increasing fiber and hydration.  I did provide a school note with recommendation that she could return tomorrow if her symptoms improved.    Lurline Del, [place]

## 2021-08-27 NOTE — Patient Instructions (Signed)
Today we discussed your abdominal pain.  I recommend continue to take your MiraLAX and senna daily in order to have a soft bowel movement each day.  From an IBS standpoint increasing your daily fiber and daily fluid intake will help regulate things.  I also think it is reasonable to try 1 month of a reflux medicine to see if this is causing your pain.  I would like to see you back in 1-2 weeks to see how your symptoms are doing.  If you develop any worsening abdominal pain, significant vomiting, fevers, blood in stool, or other concerning symptoms you should return sooner or go to the ER.

## 2021-09-01 ENCOUNTER — Ambulatory Visit (HOSPITAL_COMMUNITY)
Admission: EM | Admit: 2021-09-01 | Discharge: 2021-09-01 | Disposition: A | Discharge status: Home / Self Care | Payer: Medicaid Other | Attending: Sports Medicine | Admitting: Sports Medicine

## 2021-09-01 ENCOUNTER — Encounter (HOSPITAL_COMMUNITY): Payer: Self-pay | Admitting: Emergency Medicine

## 2021-09-01 ENCOUNTER — Other Ambulatory Visit: Payer: Self-pay

## 2021-09-01 DIAGNOSIS — R1084 Generalized abdominal pain: Secondary | ICD-10-CM | POA: Insufficient documentation

## 2021-09-01 DIAGNOSIS — D649 Anemia, unspecified: Secondary | ICD-10-CM | POA: Insufficient documentation

## 2021-09-01 DIAGNOSIS — K581 Irritable bowel syndrome with constipation: Secondary | ICD-10-CM | POA: Insufficient documentation

## 2021-09-01 LAB — CBC WITH DIFFERENTIAL/PLATELET
Abs Immature Granulocytes: 0.01 10*3/uL (ref 0.00–0.07)
Basophils Absolute: 0 10*3/uL (ref 0.0–0.1)
Basophils Relative: 0 %
Eosinophils Absolute: 0.2 10*3/uL (ref 0.0–0.5)
Eosinophils Relative: 4 %
HCT: 35.8 % — ABNORMAL LOW (ref 36.0–46.0)
Hemoglobin: 11.6 g/dL — ABNORMAL LOW (ref 12.0–15.0)
Immature Granulocytes: 0 %
Lymphocytes Relative: 51 %
Lymphs Abs: 2.7 10*3/uL (ref 0.7–4.0)
MCH: 28.3 pg (ref 26.0–34.0)
MCHC: 32.4 g/dL (ref 30.0–36.0)
MCV: 87.3 fL (ref 80.0–100.0)
Monocytes Absolute: 0.5 10*3/uL (ref 0.1–1.0)
Monocytes Relative: 9 %
Neutro Abs: 1.9 10*3/uL (ref 1.7–7.7)
Neutrophils Relative %: 36 %
Platelets: 213 10*3/uL (ref 150–400)
RBC: 4.1 MIL/uL (ref 3.87–5.11)
RDW: 13.1 % (ref 11.5–15.5)
WBC: 5.3 10*3/uL (ref 4.0–10.5)
nRBC: 0 % (ref 0.0–0.2)

## 2021-09-01 LAB — C-REACTIVE PROTEIN: CRP: 1 mg/dL — ABNORMAL HIGH (ref <1.0)

## 2021-09-01 MED ORDER — DICYCLOMINE HCL 20 MG PO TABS: 20.0000 mg | ORAL_TABLET | Freq: Two times a day (BID) | ORAL | 0 refills | Status: DC

## 2021-09-01 NOTE — ED Triage Notes (Signed)
Pt c/o general abd pain for 2 weeks. Reports this morning having upper abd pains with nausea and back pains.

## 2021-09-01 NOTE — ED Provider Notes (Signed)
Jacqueline Gonzalez    CSN: [id number] Arrival date & time: 09/01/21  0945      History   Chief Complaint Chief Complaint  Patient presents with   Abdominal Pain    HPI Jacqueline Gonzalez is a 21 y.o. female here for abdominal pain x 2 weeks.   Abdominal Pain Associated symptoms: constipation and diarrhea   Associated symptoms: no chest pain, no chills, no dysuria, no fever, no nausea, no vaginal bleeding, no vaginal discharge and no vomiting    History of IBS - not on any medication. History of IBS-constipation Pain at top of stomach, but all throughout the abdomen and in lower quadrants.  Last BM: has been over 2 days, was somewhat runny Unsure of last normal BM.  No blood in stool  No fever/chills Had COVID-19 about 3-4 weeks ago, improved No sexual activity with males - with females only  No vaginal discharge No dysuria, increased urinary frequency or hematuria Some low back pain, on and off for 3 months or so  Upon chart review, she has been seen for this multiple times in the past.  Last seen by family medicine physician on 08/27/2021.  Previous abdominal x-ray that showed moderate stool burden.  She did have a negative pregnancy test at that visit.  Patient was put on trial of Pepcid as well as continuing MiraLAX.   Past Medical History:  Diagnosis Date   Constipation, chronic    Environmental allergies    Ganglion cyst of dorsum of left wrist    RECURRENT   GERD (gastroesophageal reflux disease)    watches diet   Headache    IBS (irritable bowel syndrome)    Lactose intolerance     Patient Active Problem List   Diagnosis Date Noted   Moderate episode of recurrent major depressive disorder ([place]) 06/23/2021   Low back pain 05/02/2021   Headache 09/03/2020   Nonspecific abdominal pain 08/30/2020   Ganglion cyst of dorsum of left wrist 11/03/2019   Constipation 01/21/2018   Irregular periods 01/21/2018   Generalized anxiety disorder 01/21/2018     Past Surgical History:  Procedure Laterality Date   GANGLION CYST EXCISION Left 04/24/2021   Procedure: left recurrent dorsal carpal ganglion excision;  Surgeon: [name], MD;  Location: [facility];  Service: Orthopedics;  Laterality: Left;   TONSILLECTOMY AND ADENOIDECTOMY     AGE 64   WISDOM TOOTH EXTRACTION  03/18/2021   WRIST GANGLION EXCISION Left 02/2020    OB History   No obstetric history on file.      Home Medications    Prior to Admission medications   Medication Sig Start Date End Date Taking? Authorizing Provider  dicyclomine (BENTYL) 20 MG tablet Take 1 tablet (20 mg total) by mouth 2 (two) times daily. 09/01/21  Yes Elba Barman, DO  albuterol (VENTOLIN HFA) 108 (90 Base) MCG/ACT inhaler Inhale 2 puffs into the lungs every 6 (six) hours as needed for wheezing or shortness of breath. 08/05/21   Mar Daring, PA-C  benzonatate (TESSALON) 100 MG capsule Take 1 capsule (100 mg total) by mouth 3 (three) times daily as needed. 08/05/21   Mar Daring, PA-C  famotidine (PEPCID) 20 MG tablet Take 1 tablet (20 mg total) by mouth 2 (two) times daily. 08/27/21   Lurline Del, DO  FLUoxetine (PROZAC) 10 MG capsule Take 1 capsule (10 mg total) by mouth daily. 08/01/21 08/01/22  Nwoko, Terese Door, PA  fluticasone (FLONASE) 50 MCG/ACT nasal spray  Place 2 sprays into both nostrils daily. 08/05/21   Mar Daring, PA-C  hydrOXYzine (ATARAX) 10 MG tablet Take 1 tablet (10 mg total) by mouth 3 (three) times daily as needed. 08/01/21   Nwoko, Terese Door, PA  naproxen (NAPROSYN) 500 MG tablet Take 1 tablet (500 mg total) by mouth 2 (two) times daily with a meal. 08/05/21   Burnette, Clearnce Sorrel, PA-C  ondansetron (ZOFRAN-ODT) 4 MG disintegrating tablet Take 1 tablet (4 mg total) by mouth every 8 (eight) hours as needed for nausea or vomiting. 08/14/21   Brunetta Jeans, PA-C  polyethylene glycol powder (GLYCOLAX/MIRALAX) 17 GM/SCOOP powder Take 17 g by mouth  in the morning and at bedtime. 07/23/21   Zola Button, MD  senna (SENOKOT) 8.6 MG TABS tablet Take 1 tablet (8.6 mg total) by mouth daily. 07/23/21   Zola Button, MD    Family History Family History  Problem Relation Age of Onset   Asthma Father    Diabetes Maternal Grandmother    Diabetes Paternal Grandmother    Kidney disease Paternal Alanda Slim' disease Paternal Aunt     Social History Social History   Tobacco Use   Smoking status: Never   Smokeless tobacco: Never  Vaping Use   Vaping Use: Never used  Substance Use Topics   Alcohol use: Never   Drug use: Never     Allergies   Blue dyes (parenteral), Blueberry [vaccinium angustifolium], Raspberry, and Shellfish allergy   Review of Systems Review of Systems  Constitutional:  Negative for chills and fever.  Cardiovascular:  Negative for chest pain.  Gastrointestinal:  Positive for abdominal pain, constipation and diarrhea. Negative for blood in stool, nausea and vomiting.  Genitourinary:  Negative for difficulty urinating, dysuria, urgency, vaginal bleeding and vaginal discharge.  Skin:  Negative for rash.  Neurological:  Negative for weakness.    Physical Exam Triage Vital Signs ED Triage Vitals  Enc Vitals Group     BP 09/01/21 1042 117/67     Pulse Rate 09/01/21 1042 74     Resp 09/01/21 1042 18     Temp 09/01/21 1042 98.3 F (36.8 C)     Temp Source 09/01/21 1042 Oral     SpO2 09/01/21 1042 98 %     Weight --      Height --      Head Circumference --      Peak Flow --      Pain Score 09/01/21 1040 9     Pain Loc --      Pain Edu? --      Excl. in [place]? --    No data found.  Updated Vital Signs BP 117/67 (BP Location: Right Arm)    Pulse 74    Temp 98.3 F (36.8 C) (Oral)    Resp 18    LMP 08/19/2021    SpO2 98%   Physical Exam Constitutional:      General: She is not in acute distress.    Appearance: She is well-developed. She is not ill-appearing.  HENT:     Head: Normocephalic and  atraumatic.  Eyes:     Extraocular Movements: Extraocular movements intact.     Pupils: Pupils are equal, round, and reactive to light.  Cardiovascular:     Rate and Rhythm: Normal rate.  Abdominal:     General: Abdomen is flat.     Tenderness: There is abdominal tenderness (all throughout abdomen, but worse in RUQ). There is  no right CVA tenderness, left CVA tenderness, guarding or rebound.     Comments: + BS present, but diminished No hepato/splenomegaly  Skin:    Capillary Refill: Capillary refill takes less than 2 seconds.  Neurological:     Mental Status: She is alert.     UC Treatments / Results  Labs (all labs ordered are listed, but only abnormal results are displayed) Labs Reviewed  CBC WITH DIFFERENTIAL/PLATELET - Abnormal; Notable for the following components:      Result Value   Hemoglobin 11.6 (*)    HCT 35.8 (*)    All other components within normal limits  C-REACTIVE PROTEIN    EKG   Radiology No results found.  Procedures Procedures (including critical care time)  Medications Ordered in UC Medications - No data to display  Initial Impression / Assessment and Plan / UC Course  I have reviewed the triage vital signs and the nursing notes.  Pertinent labs & imaging results that were available during my care of the patient were reviewed by me and considered in my medical decision making (see chart for details).     Chronic abdominal pain IBS-constipation Anemia, mild. Normocytic.  Patient presents with acute on chronic abdominal pain in the setting of IBS-constipation.  She has been seen for this a few times both here at the urgent care and by her primary care physician.  Given the recurrence of her symptoms, I did decide to proceed with a CBC and CRP to rule out inflammatory causes that may be linked with conditions like IBD, appendicitis, diverticulitis, although these are most certainly less likely causes.  We will call her with these results if  abnormal.  For her IBS and abdominal pain, I did provide her with a handout on FODMAP diet for her to follow-up.  Prescription for Bentyl was provided to help with her abdominal pain.  It does appear she is treated with Prozac, will consider follow-up with her PCP for longer-term management see if a different SSRI or possibly a low-dose TCA may help with her symptoms of IBS, and she does it.  To be somewhat depressed with this.  We will leave that up to her PCP.  Return precautions provided.  She is safe for discharge home with follow-up with PCP.  Addendum (11:48): CBC did return with hemoglobin 11.6.  No leukocytosis.  Her hemoglobin is slightly low, it has been down into the 11's in the past.  Do not see a large drop.  Recommend follow-up with PCP if decision to be seen by gastroenterology may be warranted.    Final Clinical Impressions(s) / UC Diagnoses   Final diagnoses:  Generalized abdominal pain  Irritable bowel syndrome with constipation  Anemia, unspecified type   Discharge Instructions   None    ED Prescriptions     Medication Sig Dispense Auth. Provider   dicyclomine (BENTYL) 20 MG tablet Take 1 tablet (20 mg total) by mouth 2 (two) times daily. 30 tablet Elba Barman, DO      PDMP not reviewed this encounter.   Elba Barman, DO 09/01/21 1148

## 2021-09-09 ENCOUNTER — Ambulatory Visit (HOSPITAL_COMMUNITY): Payer: Medicaid Other | Admitting: Clinical

## 2021-09-11 ENCOUNTER — Telehealth: Payer: Medicaid Other | Admitting: Physician Assistant

## 2021-09-11 DIAGNOSIS — H1011 Acute atopic conjunctivitis, right eye: Secondary | ICD-10-CM

## 2021-09-11 MED ORDER — OLOPATADINE HCL 0.2 % OP SOLN: 1.0000 [drp] | Freq: Every day | OPHTHALMIC | 0 refills | Status: DC

## 2021-09-11 MED ORDER — CIPROFLOXACIN HCL 500 MG PO TABS: 500.0000 mg | ORAL_TABLET | Freq: Two times a day (BID) | ORAL | 0 refills | Status: AC

## 2021-09-11 MED ORDER — POLYMYXIN B-TRIMETHOPRIM 10000-0.1 UNIT/ML-% OP SOLN: 1.0000 [drp] | OPHTHALMIC | 0 refills | Status: DC

## 2021-09-11 NOTE — Progress Notes (Signed)
Virtual Visit Consent   Jacqueline Gonzalez, you are scheduled for a virtual visit with a [facility] provider today.     Just as with appointments in the office, your consent must be obtained to participate.  Your consent will be active for this visit and any virtual visit you may have with one of our providers in the next 365 days.     If you have a MyChart account, a copy of this consent can be sent to you electronically.  All virtual visits are billed to your insurance company just like a traditional visit in the office.    As this is a virtual visit, video technology does not allow for your provider to perform a traditional examination.  This may limit your provider's ability to fully assess your condition.  If your provider identifies any concerns that need to be evaluated in person or the need to arrange testing (such as labs, EKG, etc.), we will make arrangements to do so.     Although advances in technology are sophisticated, we cannot ensure that it will always work on either your end or our end.  If the connection with a video visit is poor, the visit may have to be switched to a telephone visit.  With either a video or telephone visit, we are not always able to ensure that we have a secure connection.     I need to obtain your verbal consent now.   Are you willing to proceed with your visit today?    Jacqueline Gonzalez has provided verbal consent on 09/11/2021 for a virtual visit (video or telephone).   Mar Daring, PA-C   Date: 09/11/2021 2:08 PM   Virtual Visit via Video Note   I, Mar Daring, connected with  Jacqueline Gonzalez  ([id number], 11/14/2000) on 09/11/21 at  2:00 PM EST by a video-enabled telemedicine application and verified that I am speaking with the correct person using two identifiers.  Location: Patient: Virtual Visit Location Patient: Home Provider: Virtual Visit Location Provider: Home Office   I discussed the limitations of evaluation and  management by telemedicine and the availability of in person appointments. The patient expressed understanding and agreed to proceed.    History of Present Illness: Jacqueline Gonzalez is a 21 y.o. who identifies as a female who was assigned female at birth, and is being seen today for swollen eye.  HPI: Eye Problem  The right eye is affected. This is a new problem. The current episode started yesterday. The problem occurs constantly. The problem has been rapidly worsening. Injury mechanism: no injury, but patient was at a friend's house that has a dog and she is allergic to dogs. The pain is mild. There is No known exposure to pink eye. She Does not wear contacts. Associated symptoms include blurred vision, an eye discharge, eye redness, a foreign body sensation and itching. Pertinent negatives include no double vision, fever, nausea, photophobia or recent URI. Associated symptoms comments: Significant swelling of the right eye. She has tried eye drops for the symptoms. The treatment provided no relief.     Problems:  Patient Active Problem List   Diagnosis Date Noted   Moderate episode of recurrent major depressive disorder ([place]) 06/23/2021   Low back pain 05/02/2021   Headache 09/03/2020   Nonspecific abdominal pain 08/30/2020   Ganglion cyst of dorsum of left wrist 11/03/2019   Constipation 01/21/2018   Irregular periods 01/21/2018   Generalized anxiety disorder 01/21/2018    Allergies:  Allergies  Allergen Reactions   Blue Dyes (Parenteral) Anaphylaxis   Blueberry [Vaccinium Angustifolium] Anaphylaxis   Raspberry Anaphylaxis   Shellfish Allergy Shortness Of Breath    ALL SHELLFISH   Medications:  Current Outpatient Medications:    ciprofloxacin (CIPRO) 500 MG tablet, Take 1 tablet (500 mg total) by mouth 2 (two) times daily for 5 days., Disp: 10 tablet, Rfl: 0   Olopatadine HCl 0.2 % SOLN, Apply 1 drop to eye daily., Disp: 2.5 mL, Rfl: 0   trimethoprim-polymyxin b (POLYTRIM)  ophthalmic solution, Place 1 drop into the right eye every 4 (four) hours., Disp: 10 mL, Rfl: 0   albuterol (VENTOLIN HFA) 108 (90 Base) MCG/ACT inhaler, Inhale 2 puffs into the lungs every 6 (six) hours as needed for wheezing or shortness of breath., Disp: 8 g, Rfl: 0   benzonatate (TESSALON) 100 MG capsule, Take 1 capsule (100 mg total) by mouth 3 (three) times daily as needed., Disp: 30 capsule, Rfl: 0   dicyclomine (BENTYL) 20 MG tablet, Take 1 tablet (20 mg total) by mouth 2 (two) times daily., Disp: 30 tablet, Rfl: 0   famotidine (PEPCID) 20 MG tablet, Take 1 tablet (20 mg total) by mouth 2 (two) times daily., Disp: 60 tablet, Rfl: 0   FLUoxetine (PROZAC) 10 MG capsule, Take 1 capsule (10 mg total) by mouth daily., Disp: 30 capsule, Rfl: 1   fluticasone (FLONASE) 50 MCG/ACT nasal spray, Place 2 sprays into both nostrils daily., Disp: 16 g, Rfl: 0   hydrOXYzine (ATARAX) 10 MG tablet, Take 1 tablet (10 mg total) by mouth 3 (three) times daily as needed., Disp: 75 tablet, Rfl: 1   naproxen (NAPROSYN) 500 MG tablet, Take 1 tablet (500 mg total) by mouth 2 (two) times daily with a meal., Disp: 30 tablet, Rfl: 0   ondansetron (ZOFRAN-ODT) 4 MG disintegrating tablet, Take 1 tablet (4 mg total) by mouth every 8 (eight) hours as needed for nausea or vomiting., Disp: 20 tablet, Rfl: 0   polyethylene glycol powder (GLYCOLAX/MIRALAX) 17 GM/SCOOP powder, Take 17 g by mouth in the morning and at bedtime., Disp: 850 g, Rfl: 3   senna (SENOKOT) 8.6 MG TABS tablet, Take 1 tablet (8.6 mg total) by mouth daily., Disp: 120 tablet, Rfl: 0  Observations/Objective: Patient is well-developed, well-nourished in no acute distress.  Resting comfortably at home.  Head is normocephalic, atraumatic.  No labored breathing.  Speech is clear and coherent with logical content.  Patient is alert and oriented at baseline.  Right eye is swollen, almost shut; clear drainage noted. Had purulent discharge this  morning  Assessment and Plan: 1. Allergic conjunctivitis of right eye - ciprofloxacin (CIPRO) 500 MG tablet; Take 1 tablet (500 mg total) by mouth 2 (two) times daily for 5 days.  Dispense: 10 tablet; Refill: 0 - trimethoprim-polymyxin b (POLYTRIM) ophthalmic solution; Place 1 drop into the right eye every 4 (four) hours.  Dispense: 10 mL; Refill: 0 - Olopatadine HCl 0.2 % SOLN; Apply 1 drop to eye daily.  Dispense: 2.5 mL; Refill: 0  - Suspect allergic conjunctivitis but since significantly swollen will cover for possible infection - Oral cipro to cover for any possibility of orbital cellulitis since so swollen (not red or warm) - Polytrim for possible bacterial conjunctivitis - Olopatadine drops for allergy component - Take oral OTC antihistamine (Zyrtec/cetirizine preferred) - Cold compresses to eye - Strict precautions of when to proceed to ER or seek in person evaluation verbally discussed; patient voiced understanding  Follow  Up Instructions: I discussed the assessment and treatment plan with the patient. The patient was provided an opportunity to ask questions and all were answered. The patient agreed with the plan and demonstrated an understanding of the instructions.  A copy of instructions were sent to the patient via MyChart unless otherwise noted below.   The patient was advised to call back or seek an in-person evaluation if the symptoms worsen or if the condition fails to improve as anticipated.  Time:  I spent 15 minutes with the patient via telehealth technology discussing the above problems/concerns.    Mar Daring, PA-C

## 2021-09-11 NOTE — Patient Instructions (Signed)
Jacqueline Gonzalez, thank you for joining Mar Daring, PA-C for today's virtual visit.  While this provider is not your primary care provider (PCP), if your PCP is located in our provider database this encounter information will be shared with them immediately following your visit.  Consent: (Patient) Jacqueline Gonzalez provided verbal consent for this virtual visit at the beginning of the encounter.  Current Medications:  Current Outpatient Medications:    ciprofloxacin (CIPRO) 500 MG tablet, Take 1 tablet (500 mg total) by mouth 2 (two) times daily for 5 days., Disp: 10 tablet, Rfl: 0   Olopatadine HCl 0.2 % SOLN, Apply 1 drop to eye daily., Disp: 2.5 mL, Rfl: 0   trimethoprim-polymyxin b (POLYTRIM) ophthalmic solution, Place 1 drop into the right eye every 4 (four) hours., Disp: 10 mL, Rfl: 0   albuterol (VENTOLIN HFA) 108 (90 Base) MCG/ACT inhaler, Inhale 2 puffs into the lungs every 6 (six) hours as needed for wheezing or shortness of breath., Disp: 8 g, Rfl: 0   benzonatate (TESSALON) 100 MG capsule, Take 1 capsule (100 mg total) by mouth 3 (three) times daily as needed., Disp: 30 capsule, Rfl: 0   dicyclomine (BENTYL) 20 MG tablet, Take 1 tablet (20 mg total) by mouth 2 (two) times daily., Disp: 30 tablet, Rfl: 0   famotidine (PEPCID) 20 MG tablet, Take 1 tablet (20 mg total) by mouth 2 (two) times daily., Disp: 60 tablet, Rfl: 0   FLUoxetine (PROZAC) 10 MG capsule, Take 1 capsule (10 mg total) by mouth daily., Disp: 30 capsule, Rfl: 1   fluticasone (FLONASE) 50 MCG/ACT nasal spray, Place 2 sprays into both nostrils daily., Disp: 16 g, Rfl: 0   hydrOXYzine (ATARAX) 10 MG tablet, Take 1 tablet (10 mg total) by mouth 3 (three) times daily as needed., Disp: 75 tablet, Rfl: 1   naproxen (NAPROSYN) 500 MG tablet, Take 1 tablet (500 mg total) by mouth 2 (two) times daily with a meal., Disp: 30 tablet, Rfl: 0   ondansetron (ZOFRAN-ODT) 4 MG disintegrating tablet, Take 1 tablet (4 mg total)  by mouth every 8 (eight) hours as needed for nausea or vomiting., Disp: 20 tablet, Rfl: 0   polyethylene glycol powder (GLYCOLAX/MIRALAX) 17 GM/SCOOP powder, Take 17 g by mouth in the morning and at bedtime., Disp: 850 g, Rfl: 3   senna (SENOKOT) 8.6 MG TABS tablet, Take 1 tablet (8.6 mg total) by mouth daily., Disp: 120 tablet, Rfl: 0   Medications ordered in this encounter:  Meds ordered this encounter  Medications   ciprofloxacin (CIPRO) 500 MG tablet    Sig: Take 1 tablet (500 mg total) by mouth 2 (two) times daily for 5 days.    Dispense:  10 tablet    Refill:  0    Order Specific Question:   Supervising Provider    Answer:   Sabra Heck, BRIAN [[id number]]   trimethoprim-polymyxin b (POLYTRIM) ophthalmic solution    Sig: Place 1 drop into the right eye every 4 (four) hours.    Dispense:  10 mL    Refill:  0    Order Specific Question:   Supervising Provider    Answer:   MILLER, BRIAN [[id number]]   Olopatadine HCl 0.2 % SOLN    Sig: Apply 1 drop to eye daily.    Dispense:  2.5 mL    Refill:  0    Order Specific Question:   Supervising Provider    Answer:   Sabra Heck, BRIAN [[id number]]     *If  you need refills on other medications prior to your next appointment, please contact your pharmacy*  Follow-Up: Call back or seek an in-person evaluation if the symptoms worsen or if the condition fails to improve as anticipated.  Other Instructions Allergic Conjunctivitis, Adult Allergic conjunctivitis is inflammation of the conjunctiva. The conjunctiva is the thin, clear membrane that covers the white part of the eye and the inner surface of the eyelid. In this condition: The blood vessels in the conjunctiva become irritated and swell. The eyes become red or pink and feel itchy. Allergic conjunctivitis cannot be spread from person to person. This condition can develop at any age and may be outgrown. What are the causes? This condition is caused by allergens. These are things that can cause an allergic  reaction in some people but not in other people. Common allergens include: Outdoor allergens, such as: Pollen, including pollen from grass and weeds. Mold spores. Car fumes. Indoor allergens, such as: Dust. Smoke. Mold spores. Proteins in a pet's urine, saliva, or dander. What increases the risk? You may be more likely to develop this condition if you have a family history of these things: Allergies. Conditions caused by being exposed to allergens, such as: Allergic rhinitis. This is an allergic reaction that affects the nose. Bronchial asthma. This condition affects the large airways in the lungs and makes breathing difficult. Atopic dermatitis (eczema). This is inflammation of the skin that is long-term (chronic). What are the signs or symptoms? Symptoms of this condition include eyes that are: Itchy. Red. Watery. Puffy. Your eyes may also: Sting or burn. Have clear fluid draining from them. Have thick mucus discharge and pain (vernal conjunctivitis). How is this diagnosed? This condition may be diagnosed by: Your medical history. A physical exam. Tests of the fluid draining from your eyes to rule out other causes. Other tests to confirm the diagnosis, including: Testing for allergies. The skin may be pricked with a tiny needle. The pricked area is then exposed to small amounts of allergens. Testing for other eye conditions. Tests may include: Blood tests. Tissue scrapings from your eyelid. The tissue is then checked under a microscope. How is this treated? This condition may be treated with: Cold, wet cloths (cold compresses) to soothe itching and swelling. Washing the face to remove allergens. Eye drops. These may be prescription or over-the-counter. You may need to try different types to see which one works best for you, such as: Eye drops that block the allergic reaction (antihistamine). Eye drops that reduce swelling and irritation (anti-inflammatory). Steroid eye  drops, which may be given if other treatments have not worked (vernal conjunctivitis). Oral antihistamine medicines. These are medicines taken by mouth to lessen your allergic reaction. You may need these if eye drops do not help or are difficult to use. Follow these instructions at home: Eye care Apply a clean, cold compress to your eyes for 10-20 minutes, 3-4 times a day. Do not touch or rub your eyes. Do not wear contact lenses until the inflammation is gone. Wear glasses instead. Do not wear eye makeup until the inflammation is gone. General instructions Avoid known allergens whenever possible. Take or apply over-the-counter and prescription medicines only as told by your health care provider. These include any eye drops. Drink enough fluid to keep your urine pale yellow. Keep all follow-up visits as told by your health care provider. This is important. Contact a health care provider if: Your symptoms get worse or do not get better with treatment. You  have mild eye pain. You become sensitive to light. You have spots or blisters on your eyes. You have pus draining from your eyes. You have a fever. Get help right away if: You have redness, swelling, or other symptoms in only one eye. Your vision is blurred or you have other vision changes. You have severe eye pain. Summary Allergic conjunctivitis is inflammation of the clear membrane that covers the white part of the eye and the inner surface of the eyelid. Take or apply over-the-counter and prescription medicines only as told by your health care provider. These include eye drops. Do not touch or rub your eyes. Contact a health care provider if your symptoms get worse or do not get better with treatment. This information is not intended to replace advice given to you by your health care provider. Make sure you discuss any questions you have with your health care provider. Document Revised: 05/29/2019 Document Reviewed:  05/29/2019 Elsevier Patient Education  [employer].    Orbital Cellulitis (What I am worried for and want you to watch for) Orbital cellulitis is an infection in the eye socket (orbit) and the tissues that surround the eye. The infection can spread to the eyelids, eyebrow area, and cheek. It can also cause a pocket of pus to develop around the eye (orbital abscess). In severe cases, the infection can spread to the brain. Orbital cellulitis is a medical emergency. What are the causes? The most common cause of this condition is a bacterial infection. The infection usually spreads to the eye socket from another part of the body. The infection may start in the: Nose or sinuses. Eyelids. Facial skin. Bloodstream. What increases the risk? You are more likely to develop this condition if you recently had one of the following: Upper respiratory infection. This affects the nose, throat, and upper air passages. Sinus infection. Eyelid or facial infection. Tooth infection. Eye injury or an object behind the eyeball that should not be there (foreign body). Infection that affects the entire body or the bloodstream (systemic infection). What are the signs or symptoms? Symptoms of this condition include: Eye pain that gets worse with eye movement. Swelling around the eye. Eye redness. Bulging of the eye. Inability to move the eye. Double vision or decreased vision. Fever. Symptoms of this condition usually start quickly. How is this diagnosed? This condition may be diagnosed based on your symptoms and an eye exam. You may also have tests to confirm the diagnosis and to check for an orbital abscess. Other tests (cultures) may be done to find out which specific bacteria are causing the infection. Tests may include: Complete blood count (CBC). Blood culture. Nose, sinus, or throat culture. Imaging studies, such as a CT scan. Less commonly, you may also have an MRI. How is this  treated? This condition is usually treated with antibiotic medicines in a hospital. Antibiotics are given directly into a vein through an IV. At first, you may get IV antibiotics to kill bacteria that often cause orbital cellulitis (broad spectrum antibiotics). Your medicine may be changed if the culture test results suggest that another medicine would be better. If the IV antibiotics are working to treat your infection, you may be switched to oral antibiotics and allowed to go home. In some cases, surgery may be needed to release pressure from behind the eye or to drain an orbital abscess. Follow these instructions at home: Take over-the-counter and prescription medicines only as told by your health care provider. Take your  antibiotic medicine as told by your health care provider. Do not stop taking the antibiotic even if you start to feel better. Return to your normal activities as told by your health care provider. Ask your health care provider what activities are safe for you. Keep all follow-up visits. This is important. Contact a health care provider if: You have questions about your medicines. Your pain is not well-controlled. Get help right away if: Your eye pain or swelling returns or it gets worse. You have any changes in your vision. You develop a fever. You have vomiting. You develop a severe headache or numbness in your face. These symptoms may represent a serious problem that is an emergency. Do not wait to see if the symptoms will go away. Get medical help right away. Call your local emergency services (911 in the [place] Do not drive yourself to the hospital. Summary Orbital cellulitis is an infection in the eye socket (orbit) and the tissues that surround the eye. The infection can spread to other areas. Symptoms usually start quickly. Some of the symptoms include eye pain that gets worse with movement, redness and swelling around the eye, double vision, or decreased  vision. Orbital cellulitis is a medical emergency, and it requires IV antibiotics for treatment. Get help right away if your symptoms return or get worse. This information is not intended to replace advice given to you by your health care provider. Make sure you discuss any questions you have with your health care provider. Document Revised: 11/08/2019 Document Reviewed: 11/08/2019 Elsevier Patient Education  2022 [employer].    If you have been instructed to have an in-person evaluation today at a local Urgent Care facility, please use the link below. It will take you to a list of all of our available [facility], including address, phone number and hours of operation. Please do not delay care.  [facility]  If you or a family member do not have a primary care provider, use the link below to schedule a visit and establish care. When you choose a [facility] primary care physician or advanced practice provider, you gain a long-term partner in health. Find a Primary Care Provider  Learn more about [facility]'s in-office and virtual care options: [facility]

## 2021-09-12 ENCOUNTER — Other Ambulatory Visit: Payer: Self-pay

## 2021-09-12 ENCOUNTER — Telehealth (INDEPENDENT_AMBULATORY_CARE_PROVIDER_SITE_OTHER): Payer: Medicaid Other | Admitting: Physician Assistant

## 2021-09-12 DIAGNOSIS — F411 Generalized anxiety disorder: Secondary | ICD-10-CM | POA: Diagnosis not present

## 2021-09-12 DIAGNOSIS — F331 Major depressive disorder, recurrent, moderate: Secondary | ICD-10-CM | POA: Diagnosis not present

## 2021-09-12 MED ORDER — FLUOXETINE HCL 20 MG PO CAPS: 20.0000 mg | ORAL_CAPSULE | Freq: Every day | ORAL | 2 refills | Status: DC

## 2021-09-12 NOTE — Progress Notes (Signed)
[place] MD/PA/NP OP Progress Note  Virtual Visit via Telephone Note  I connected with Jacqueline Gonzalez on 09/12/21 at  2:00 PM EST by telephone and verified that I am speaking with the correct person using two identifiers.  Location: Patient: Home Provider: Clinic   I discussed the limitations, risks, security and privacy concerns of performing an evaluation and management service by telephone and the availability of in person appointments. I also discussed with the patient that there may be a patient responsible charge related to this service. The patient expressed understanding and agreed to proceed.  Follow Up Instructions:  I discussed the assessment and treatment plan with the patient. The patient was provided an opportunity to ask questions and all were answered. The patient agreed with the plan and demonstrated an understanding of the instructions.   The patient was advised to call back or seek an in-person evaluation if the symptoms worsen or if the condition fails to improve as anticipated.  I provided 13 minutes of non-face-to-face time during this encounter.  Malachy Mood, PA   09/12/2021 2:03 PM Jacqueline Gonzalez  MRN:  [id number]  Chief Complaint:  Chief Complaint  Patient presents with   Follow-up   Medication Management   HPI:   Jacqueline Gonzalez is a 21 year old female with a past psychiatric history significant for generalized anxiety disorder and major depressive disorder who presents to [facility] via virtual telephone visit for follow-up and medication management.  Patient is currently being managed on the following medications:  Hydroxyzine 10 mg 3 times daily as needed Fluoxetine 10 mg daily  Patient reports that things have been going well with taking her medications.  Patient notes that she did experience spacing out and accelerated heart rate for a few days when taking hydroxyzine.  Due to experiencing the side  effects, patient has discontinued taking hydroxyzine.  She continues to endorse that her use of fluoxetine has been going well.  She reports that her depression and anxiety have been fairly managed.  Patient endorses experiencing depression roughly once a week characterized by the following symptoms: overwhelming sadness, self-isolation, and difficulty getting out of bed.  Patient continues to endorse anxiety but states that she still manages to push through.  Patient rates her anxiety as 6 or 7 out of 10.  Before taking her fluoxetine, patient states that whenever she experienced anxiety, she would cancel everything that she had planned.  Patient reports that she does feel more fidgety as of late due to being out of school for medical reasons.  A GAD-7 screen was performed with the patient scoring a 13.  A PHQ-9 screen was performed with the patient scoring an 11.  Patient is alert and oriented x4, calm, cooperative, and fully engaged in conversation during the encounter.  Patient describes her mood as being chill and calm.  Patient denies suicidal or homicidal ideations.  She further denies auditory or visual hallucinations and does not appear to be responding to internal/external stimuli.  Patient endorses fair sleep and receives on average 6 hours of sleep each night.  Patient endorses improved appetite and eats on average 1-2 meals per day.  Patient denies alcohol consumption, tobacco use, and illicit drug use.  Visit Diagnosis:    ICD-10-CM   1. Generalized anxiety disorder  F41.1 FLUoxetine (PROZAC) 20 MG capsule    2. Moderate episode of recurrent major depressive disorder (HCC)  F33.1 FLUoxetine (PROZAC) 20 MG capsule      Past Psychiatric  History:  Major depressive disorder Generalized anxiety disorder  Past Medical History:  Past Medical History:  Diagnosis Date   Constipation, chronic    Environmental allergies    Ganglion cyst of dorsum of left wrist    RECURRENT   GERD  (gastroesophageal reflux disease)    watches diet   Headache    IBS (irritable bowel syndrome)    Lactose intolerance     Past Surgical History:  Procedure Laterality Date   GANGLION CYST EXCISION Left 04/24/2021   Procedure: left recurrent dorsal carpal ganglion excision;  Surgeon: [name], MD;  Location: [place];  Service: Orthopedics;  Laterality: Left;   TONSILLECTOMY AND ADENOIDECTOMY     AGE 89   WISDOM TOOTH EXTRACTION  03/18/2021   WRIST GANGLION EXCISION Left 02/2020    Family Psychiatric History:  Father - patient denies that her father has been officially diagnosed with any mental health diagnoses but states that he suffers from mental health.   Patient also believes that her Aunt (maternal) also suffers from mental health.  Family History:  Family History  Problem Relation Age of Onset   Asthma Father    Diabetes Maternal Grandmother    Diabetes Paternal Grandmother    Kidney disease Paternal Alanda Slim' disease Paternal Aunt     Social History:  Social History   Socioeconomic History   Marital status: Single    Spouse name: Not on file   Number of children: Not on file   Years of education: Not on file   Highest education level: Not on file  Occupational History   Not on file  Tobacco Use   Smoking status: Never   Smokeless tobacco: Never  Vaping Use   Vaping Use: Never used  Substance and Sexual Activity   Alcohol use: Never   Drug use: Never   Sexual activity: Not on file  Other Topics Concern   Not on file  Social History Narrative   Not on file   Social Determinants of Health   Financial Resource Strain: Not on file  Food Insecurity: Not on file  Transportation Needs: Not on file  Physical Activity: Not on file  Stress: No Stress Concern Present   Feeling of Stress : Only a little  Social Connections: Not on file    Allergies:  Allergies  Allergen Reactions   Blue Dyes (Parenteral) Anaphylaxis    Blueberry [Vaccinium Angustifolium] Anaphylaxis   Raspberry Anaphylaxis   Shellfish Allergy Shortness Of Breath    ALL SHELLFISH    Metabolic Disorder Labs: No results found for: HGBA1C, MPG No results found for: PROLACTIN No results found for: CHOL, TRIG, HDL, CHOLHDL, VLDL, LDLCALC No results found for: TSH  Therapeutic Level Labs: No results found for: LITHIUM No results found for: VALPROATE No components found for:  CBMZ  Current Medications: Current Outpatient Medications  Medication Sig Dispense Refill   albuterol (VENTOLIN HFA) 108 (90 Base) MCG/ACT inhaler Inhale 2 puffs into the lungs every 6 (six) hours as needed for wheezing or shortness of breath. 8 g 0   benzonatate (TESSALON) 100 MG capsule Take 1 capsule (100 mg total) by mouth 3 (three) times daily as needed. 30 capsule 0   ciprofloxacin (CIPRO) 500 MG tablet Take 1 tablet (500 mg total) by mouth 2 (two) times daily for 5 days. 10 tablet 0   dicyclomine (BENTYL) 20 MG tablet Take 1 tablet (20 mg total) by mouth 2 (two) times daily. 30 tablet  0   famotidine (PEPCID) 20 MG tablet Take 1 tablet (20 mg total) by mouth 2 (two) times daily. 60 tablet 0   FLUoxetine (PROZAC) 20 MG capsule Take 1 capsule (20 mg total) by mouth daily. 30 capsule 2   fluticasone (FLONASE) 50 MCG/ACT nasal spray Place 2 sprays into both nostrils daily. 16 g 0   hydrOXYzine (ATARAX) 10 MG tablet Take 1 tablet (10 mg total) by mouth 3 (three) times daily as needed. 75 tablet 1   naproxen (NAPROSYN) 500 MG tablet Take 1 tablet (500 mg total) by mouth 2 (two) times daily with a meal. 30 tablet 0   Olopatadine HCl 0.2 % SOLN Apply 1 drop to eye daily. 2.5 mL 0   ondansetron (ZOFRAN-ODT) 4 MG disintegrating tablet Take 1 tablet (4 mg total) by mouth every 8 (eight) hours as needed for nausea or vomiting. 20 tablet 0   polyethylene glycol powder (GLYCOLAX/MIRALAX) 17 GM/SCOOP powder Take 17 g by mouth in the morning and at bedtime. 850 g 3   senna  (SENOKOT) 8.6 MG TABS tablet Take 1 tablet (8.6 mg total) by mouth daily. 120 tablet 0   trimethoprim-polymyxin b (POLYTRIM) ophthalmic solution Place 1 drop into the right eye every 4 (four) hours. 10 mL 0   No current facility-administered medications for this visit.     Musculoskeletal: Strength & Muscle Tone: Unable to assess due to telemedicine visit [place]: Unable to assess due to telemedicine visit Patient leans: Unable to assess due to telemedicine visit  Psychiatric Specialty Exam: Review of Systems  Psychiatric/Behavioral:  Positive for sleep disturbance. Negative for decreased concentration, dysphoric mood, hallucinations, self-injury and suicidal ideas. The patient is nervous/anxious. The patient is not hyperactive.    Last menstrual period 08/19/2021.There is no height or weight on file to calculate BMI.  General Appearance: Unable to assess due to telemedicine visit  Eye Contact:  Unable to assess due to telemedicine visit  Speech:  Clear and Coherent and Normal Rate  Volume:  Normal  Mood:  Anxious and Depressed  Affect:  Congruent  Thought Process:  Coherent, Goal Directed, and Descriptions of Associations: Intact  Orientation:  Full (Time, Place, and Person)  Thought Content: WDL   Suicidal Thoughts:  No  Homicidal Thoughts:  No  Memory:  Immediate;   Good Recent;   Good Remote;   Good  Judgement:  Good  Insight:  Good  Psychomotor Activity:  Normal  Concentration:  Concentration: Good and Attention Span: Good  Recall:  Good  Fund of Knowledge: Good  Language: Good  Akathisia:  No  Handed:  Right  AIMS (if indicated): not done  Assets:  Communication Skills Desire for Improvement Housing Social Support Vocational/Educational  ADL's:  Intact  Cognition: WNL  Sleep:  Fair   Screenings: GAD-7    Flowsheet Row Video Visit from 09/12/2021 in [facility] Office Visit from 08/01/2021 in [facility] from 06/23/2021 in [facility] Office Visit from 05/02/2021 in [place]  Total GAD-7 Score 13 19 17 16       PHQ2-9    Flowsheet Row Video Visit from 09/12/2021 in [facility] Office Visit from 08/27/2021 in [place] Office Visit from 08/01/2021 in [facility] Office Visit from 07/23/2021 in [place] Counselor from 06/23/2021 in [facility]  PHQ-2 Total Score  2 4 3 2 3   PHQ-9 Total Score 11 12 14 9 16       Flowsheet Row Video Visit from 09/12/2021 in [facility] ED from 09/01/2021 in [facility] [place] from 08/01/2021 in [place] No Risk No Risk No Risk        Assessment and Plan:   Libbi Towner is a 21 year old female with a past psychiatric history significant for generalized anxiety disorder and major depressive disorder who presents to [facility] via virtual telephone visit for follow-up and medication management.  Patient presents to the clinic with improved depressive symptoms and anxiety.  Patient has discontinued taking hydroxyzine due to experiencing the following side effects: spacing out and accelerated heart rate.  Patient states that her fluoxetine use has been going well but continues to experience some depressive symptoms and anxiety.  Patient was recommended increasing her dosage of fluoxetine from 10 mg to 20 mg daily for the management of her depressive symptoms and anxiety.  Patient was agreeable to recommendations.  Patient's medications to be e-prescribed to pharmacy of choice.  Collaboration of Care: Collaboration of Care: Medication Management AEB provider managing patient's psychiatric medications, Primary Care Provider  AEB patient being followed by PCP at [place] patient being followed by this mental health provider  Patient/Guardian was advised Release of Information must be obtained prior to any record release in order to collaborate their care with an outside provider. Patient/Guardian was advised if they have not already done so to contact the registration department to sign all necessary forms in order for [place] to release information regarding their care.   Consent: Patient/Guardian gives verbal consent for treatment and assignment of benefits for services provided during this visit. Patient/Guardian expressed understanding and agreed to proceed.   1. Generalized anxiety disorder  - FLUoxetine (PROZAC) 20 MG capsule; Take 1 capsule (20 mg total) by mouth daily.  Dispense: 30 capsule; Refill: 2  2. Moderate episode of recurrent major depressive disorder (HCC)  - FLUoxetine (PROZAC) 20 MG capsule; Take 1 capsule (20 mg total) by mouth daily.  Dispense: 30 capsule; Refill: 2  Patient to follow up in 2 months Provider spent a total of 13 minutes with the patient/reviewing patient's chart  Malachy Mood, PA 09/12/2021, 2:03 PM

## 2021-09-13 ENCOUNTER — Encounter (HOSPITAL_COMMUNITY): Payer: Self-pay | Admitting: Physician Assistant

## 2021-09-29 ENCOUNTER — Telehealth: Payer: Medicaid Other | Admitting: Physician Assistant

## 2021-09-29 DIAGNOSIS — R1032 Left lower quadrant pain: Secondary | ICD-10-CM

## 2021-09-29 DIAGNOSIS — R1012 Left upper quadrant pain: Secondary | ICD-10-CM

## 2021-09-29 DIAGNOSIS — R140 Abdominal distension (gaseous): Secondary | ICD-10-CM

## 2021-09-29 NOTE — Progress Notes (Signed)
?Virtual Visit Consent  ? ?[name], you are scheduled for a virtual visit with a [place] provider today.   ?  ?Just as with appointments in the office, your consent must be obtained to participate.  Your consent will be active for this visit and any virtual visit you may have with one of our providers in the next 365 days.   ?  ?If you have a MyChart account, a copy of this consent can be sent to you electronically.  All virtual visits are billed to your insurance company just like a traditional visit in the office.   ? ?As this is a virtual visit, video technology does not allow for your provider to perform a traditional examination.  This may limit your provider's ability to fully assess your condition.  If your provider identifies any concerns that need to be evaluated in person or the need to arrange testing (such as labs, EKG, etc.), we will make arrangements to do so.   ?  ?Although advances in technology are sophisticated, we cannot ensure that it will always work on either your end or our end.  If the connection with a video visit is poor, the visit may have to be switched to a telephone visit.  With either a video or telephone visit, we are not always able to ensure that we have a secure connection.    ? ?I need to obtain your verbal consent now.   Are you willing to proceed with your visit today?  ?  ?Brunella Wileman has provided verbal consent on 09/29/2021 for a virtual visit (video or telephone). ?  ?Mar Daring, PA-C  ? ?Date: 09/29/2021 3:11 PM ? ? ?Virtual Visit via Video Note  ? ?Jacqueline Gonzalez, connected with  Karlissa Aron  ([id number], 22-May-2001) on 09/29/21 at  2:30 PM EDT by a video-enabled telemedicine application and verified that I am speaking with the correct person using two identifiers. ? ?Location: ?Patient: Virtual Visit Location Patient: Home ?Provider: Virtual Visit Location Provider: Home Office ?  ?I discussed the limitations of evaluation and  management by telemedicine and the availability of in person appointments. The patient expressed understanding and agreed to proceed.   ? ?History of Present Illness: ?Jacqueline Gonzalez is a 21 y.o. who identifies as a female who was assigned female at birth, and is being seen today for abd pain. ? ?HPI: Abdominal Pain ?This is a recurrent problem. The current episode started in the past 7 days (Saturday). The onset quality is gradual. The problem occurs constantly. The problem has been gradually worsening since onset. The pain is located in the LLQ and left flank. The pain is moderate. The quality of the pain is described as aching and sharp. Radiates to: left hip. Associated symptoms include myalgias. Pertinent negatives include no anorexia, anxiety, arthralgias, constipation, diarrhea, fever, flatus, frequency, hematochezia or melena. The symptoms are relieved by being still. Past treatments include acetaminophen (heating pad, icy hot). The treatment provided no improvement relief.   ?Does have history of IBS-C, reports this is different. BM normal. Has been standing more at work. Noticed left flank and left lower quadrant is sore, painful even to lay on. Reports swollen area on stomach in left lower quadrant. ? ?Problems:  ?Patient Active Problem List  ? Diagnosis Date Noted  ? Moderate episode of recurrent major depressive disorder ([place]) 06/23/2021  ? Low back pain 05/02/2021  ? Headache 09/03/2020  ? Nonspecific abdominal pain 08/30/2020  ? Ganglion cyst  of dorsum of left wrist 11/03/2019  ? Constipation 01/21/2018  ? Irregular periods 01/21/2018  ? Generalized anxiety disorder 01/21/2018  ?  ?Allergies:  ?Allergies  ?Allergen Reactions  ? Blue Dyes (Parenteral) Anaphylaxis  ? Blueberry [Vaccinium Angustifolium] Anaphylaxis  ? Raspberry Anaphylaxis  ? Shellfish Allergy Shortness Of Breath  ?  ALL SHELLFISH  ? ?Medications:  ?Current Outpatient Medications:  ?  albuterol (VENTOLIN HFA) 108 (90 Base) MCG/ACT  inhaler, Inhale 2 puffs into the lungs every 6 (six) hours as needed for wheezing or shortness of breath., Disp: 8 g, Rfl: 0 ?  benzonatate (TESSALON) 100 MG capsule, Take 1 capsule (100 mg total) by mouth 3 (three) times daily as needed., Disp: 30 capsule, Rfl: 0 ?  dicyclomine (BENTYL) 20 MG tablet, Take 1 tablet (20 mg total) by mouth 2 (two) times daily., Disp: 30 tablet, Rfl: 0 ?  famotidine (PEPCID) 20 MG tablet, Take 1 tablet (20 mg total) by mouth 2 (two) times daily., Disp: 60 tablet, Rfl: 0 ?  FLUoxetine (PROZAC) 20 MG capsule, Take 1 capsule (20 mg total) by mouth daily., Disp: 30 capsule, Rfl: 2 ?  fluticasone (FLONASE) 50 MCG/ACT nasal spray, Place 2 sprays into both nostrils daily., Disp: 16 g, Rfl: 0 ?  hydrOXYzine (ATARAX) 10 MG tablet, Take 1 tablet (10 mg total) by mouth 3 (three) times daily as needed., Disp: 75 tablet, Rfl: 1 ?  naproxen (NAPROSYN) 500 MG tablet, Take 1 tablet (500 mg total) by mouth 2 (two) times daily with a meal., Disp: 30 tablet, Rfl: 0 ?  Olopatadine HCl 0.2 % SOLN, Apply 1 drop to eye daily., Disp: 2.5 mL, Rfl: 0 ?  ondansetron (ZOFRAN-ODT) 4 MG disintegrating tablet, Take 1 tablet (4 mg total) by mouth every 8 (eight) hours as needed for nausea or vomiting., Disp: 20 tablet, Rfl: 0 ?  polyethylene glycol powder (GLYCOLAX/MIRALAX) 17 GM/SCOOP powder, Take 17 g by mouth in the morning and at bedtime., Disp: 850 g, Rfl: 3 ?  senna (SENOKOT) 8.6 MG TABS tablet, Take 1 tablet (8.6 mg total) by mouth daily., Disp: 120 tablet, Rfl: 0 ?  trimethoprim-polymyxin b (POLYTRIM) ophthalmic solution, Place 1 drop into the right eye every 4 (four) hours., Disp: 10 mL, Rfl: 0 ? ?Observations/Objective: ?Patient is well-developed, well-nourished in no acute distress.  ?Resting comfortably at home.  ?Head is normocephalic, atraumatic.  ?No labored breathing.  ?Speech is clear and coherent with logical content.  ?Patient is alert and oriented at baseline.  ? ? ?Assessment and Plan: ?1. Left  lower quadrant abdominal pain ? ?2. Abdominal bloating ? ?3. Left upper quadrant abdominal pain ? ?- Unable to distinguish swelling in abdomen on video ?- Feel safest for patient to be evaluated in person to r/o hernia ?- Patient will seek in person evaluation at UC ? ?Follow Up Instructions: ?I discussed the assessment and treatment plan with the patient. The patient was provided an opportunity to ask questions and all were answered. The patient agreed with the plan and demonstrated an understanding of the instructions.  A copy of instructions were sent to the patient via MyChart unless otherwise noted below.  ? ? ?The patient was advised to call back or seek an in-person evaluation if the symptoms worsen or if the condition fails to improve as anticipated. ? ?Time:  ?I spent 10 minutes with the patient via telehealth technology discussing the above problems/concerns.   ? ?Mar Daring, PA-C ?

## 2021-09-29 NOTE — Patient Instructions (Signed)
?  [name], thank you for joining Mar Daring, PA-C for today's virtual visit.  While this provider is not your primary care provider (PCP), if your PCP is located in our provider database this encounter information will be shared with them immediately following your visit. ? ?Consent: ?(Patient) Jacqueline Gonzalez provided verbal consent for this virtual visit at the beginning of the encounter. ? ?Current Medications: ? ?Current Outpatient Medications:  ?  albuterol (VENTOLIN HFA) 108 (90 Base) MCG/ACT inhaler, Inhale 2 puffs into the lungs every 6 (six) hours as needed for wheezing or shortness of breath., Disp: 8 g, Rfl: 0 ?  benzonatate (TESSALON) 100 MG capsule, Take 1 capsule (100 mg total) by mouth 3 (three) times daily as needed., Disp: 30 capsule, Rfl: 0 ?  dicyclomine (BENTYL) 20 MG tablet, Take 1 tablet (20 mg total) by mouth 2 (two) times daily., Disp: 30 tablet, Rfl: 0 ?  famotidine (PEPCID) 20 MG tablet, Take 1 tablet (20 mg total) by mouth 2 (two) times daily., Disp: 60 tablet, Rfl: 0 ?  FLUoxetine (PROZAC) 20 MG capsule, Take 1 capsule (20 mg total) by mouth daily., Disp: 30 capsule, Rfl: 2 ?  fluticasone (FLONASE) 50 MCG/ACT nasal spray, Place 2 sprays into both nostrils daily., Disp: 16 g, Rfl: 0 ?  hydrOXYzine (ATARAX) 10 MG tablet, Take 1 tablet (10 mg total) by mouth 3 (three) times daily as needed., Disp: 75 tablet, Rfl: 1 ?  naproxen (NAPROSYN) 500 MG tablet, Take 1 tablet (500 mg total) by mouth 2 (two) times daily with a meal., Disp: 30 tablet, Rfl: 0 ?  Olopatadine HCl 0.2 % SOLN, Apply 1 drop to eye daily., Disp: 2.5 mL, Rfl: 0 ?  ondansetron (ZOFRAN-ODT) 4 MG disintegrating tablet, Take 1 tablet (4 mg total) by mouth every 8 (eight) hours as needed for nausea or vomiting., Disp: 20 tablet, Rfl: 0 ?  polyethylene glycol powder (GLYCOLAX/MIRALAX) 17 GM/SCOOP powder, Take 17 g by mouth in the morning and at bedtime., Disp: 850 g, Rfl: 3 ?  senna (SENOKOT) 8.6 MG TABS tablet,  Take 1 tablet (8.6 mg total) by mouth daily., Disp: 120 tablet, Rfl: 0 ?  trimethoprim-polymyxin b (POLYTRIM) ophthalmic solution, Place 1 drop into the right eye every 4 (four) hours., Disp: 10 mL, Rfl: 0  ? ?Medications ordered in this encounter:  ?No orders of the defined types were placed in this encounter. ?  ? ?*If you need refills on other medications prior to your next appointment, please contact your pharmacy* ? ?Follow-Up: ?Call back or seek an in-person evaluation if the symptoms worsen or if the condition fails to improve as anticipated. ? ?Other Instructions ?Seek in person evaluation to rule out hernia ? ? ?If you have been instructed to have an in-person evaluation today at a local Urgent Care facility, please use the link below. It will take you to a list of all of our available [facility], including address, phone number and hours of operation. Please do not delay care.  ?[facility] ? ?If you or a family member do not have a primary care provider, use the link below to schedule a visit and establish care. When you choose a [place] primary care physician or advanced practice provider, you gain a long-term partner in health. ?Find a Primary Care Provider ? ?Learn more about [place]'s in-office and virtual care options: ?[facility] ?

## 2021-09-30 NOTE — Progress Notes (Signed)
? ? ?  SUBJECTIVE:  ? ?CHIEF COMPLAINT / HPI:  ? ?Left side pain: ?Patient had virtual visit on 3/13 for concern of left-sided abdominal pain which started about 1 week prior.  Per documentation it was gradually worsening since onset and described as "aching and sharp".  Today she states the pain is the same as it was at last visit in Feb and has not changed. She discontinued the miralax due to gas pains but she is using a fiber supplement at times. Having bowel movements every 2-3 days and are sometimes hard.  Denies blood in stool or black stools. ? ?Left leg pain: ?Started after work two weeks ago. She works as a Scientist, water quality at [employer]. She denies any specific trauma but it's worse when doing prolonged standing but sometimes happens when she sits. Has a history of sciatica on that leg but this is different.  ? ?PERTINENT  PMH / PSH: History of constipation ? ?OBJECTIVE:  ? ?BP 120/70   Pulse 78   Ht 5' 9"  (1.753 m)   Wt 239 lb 8 oz (108.6 kg)   LMP 09/10/2021   SpO2 98%   BMI 35.37 kg/m?   ? ?General: NAD, pleasant, able to participate in exam ?Respiratory: No respiratory distress ?Abdomen: Bowel sounds present, no pain with palpation of the right abdomen but she does have a palpable stool ball present in the left lower abdomen and has some discomfort in that region.  No signs of a hernia on physical exam. ?MSK: No pain with palpation of the left knee other than mild medial joint line discomfort, she does have pain with palpation of the insertion of the hamstrings as well as the body of the hamstrings.  When stretching the hamstring muscles on physical exam she experiences some discomfort.  Bilateral calves are of approximately the same size in diameter/circumference ?Psych: Normal affect and mood ? ?ASSESSMENT/PLAN:  ? ?Left hamstring strain: ?Started about 2 weeks ago while at work.  She denies any specific trauma.  On physical exam she does have pain with palpation of the left hamstring muscle and some  discomfort upon stretching it.  Discussed stretching modalities and I showed her some stretches in the office today.  Recommended doing these 3 times per day.  Follow-up in 2 weeks if is not better.  We can consider physical therapy at that time.  Also were recommended trying to do some regular exercise such as walking which may help improve her symptoms. ? ?Left-sided abdominal pain: ?Patient with a history of constipation and previous imaging which showed a large stool burden.  At my last appointment I recommended MiraLAX titrated to have soft bowel movement every day but she was unable to tolerate this due to the gas pains.  She previously had a negative pregnancy test 1 month ago and endorses her symptoms being consistent since then.  On physical exam today she does have palpable stool ball.  She does have bowel sounds present.  She denies any blood in stool or black stools.  Recommended given the MiraLAX another trial versus using Senokot.  She continues to use fiber and recommended doing plenty of water throughout the day as well as trying to exercise regularly as this can help with her bowels.  Recommended titrating the stool softeners in order to have a soft bowel movement daily and following up in 1 month if her symptoms have not improved. ?  ? ? ?Lurline Del, DO ?[facility]  ?

## 2021-10-01 ENCOUNTER — Ambulatory Visit (INDEPENDENT_AMBULATORY_CARE_PROVIDER_SITE_OTHER): Payer: Medicaid Other | Admitting: Family Medicine

## 2021-10-01 ENCOUNTER — Encounter: Payer: Self-pay | Admitting: Family Medicine

## 2021-10-01 ENCOUNTER — Other Ambulatory Visit: Payer: Self-pay

## 2021-10-01 VITALS — BP 120/70 | HR 78 | Ht 69.0 in | Wt 239.5 lb

## 2021-10-01 DIAGNOSIS — S76312A Strain of muscle, fascia and tendon of the posterior muscle group at thigh level, left thigh, initial encounter: Secondary | ICD-10-CM | POA: Diagnosis not present

## 2021-10-01 DIAGNOSIS — R109 Unspecified abdominal pain: Secondary | ICD-10-CM | POA: Diagnosis not present

## 2021-10-01 NOTE — Patient Instructions (Signed)
For your left hamstring strain I recommend doing the stretches that we showed today at least 3 times daily for the next 2 weeks.  If this does not improve your symptoms or if your symptoms change or worsen please let me know. ? ?For your abdominal pain I do think this is due to constipation.  I want you to try either using the MiraLAX or the Senokot or both in order to have a soft bowel movement each day.  With the MiraLAX start with 1 scoop full each morning and slowly increase to 1 scoop full twice per day and then 2 scoops in the morning and 2 scoops at night as needed to have a soft bowel movement daily.  Follow-up with me in 1 month if your symptoms do not improve or if they worsen. ?

## 2021-10-02 ENCOUNTER — Ambulatory Visit (HOSPITAL_COMMUNITY): Payer: Medicaid Other | Admitting: Clinical

## 2021-10-02 ENCOUNTER — Encounter (HOSPITAL_COMMUNITY): Payer: Self-pay

## 2021-10-11 ENCOUNTER — Telehealth: Payer: Medicaid Other | Admitting: Nurse Practitioner

## 2021-10-11 DIAGNOSIS — B349 Viral infection, unspecified: Secondary | ICD-10-CM

## 2021-10-11 DIAGNOSIS — M791 Myalgia, unspecified site: Secondary | ICD-10-CM

## 2021-10-11 MED ORDER — NAPROXEN 500 MG PO TABS: 500.0000 mg | ORAL_TABLET | Freq: Two times a day (BID) | ORAL | 1 refills | Status: DC

## 2021-10-11 NOTE — Patient Instructions (Signed)
Muscle Pain, Adult ?Muscle pain, also called myalgia, is a condition in which a person has pain in one or more muscles in the body. Muscle pain may be mild, moderate, or severe. It may feel sharp, achy, or burning. In most cases, the pain lasts only a short time and goes away without treatment. ?Muscle pain can result from using muscles in a new or different way or after a period of inactivity. It is normal to feel some muscle pain after starting an exercise program. Muscles that have not been used often will be sore at first. ?What are the causes? ?This condition is caused by using muscles in a new or different way after a period of inactivity. Other causes may include: ?Overuse or muscle strain, especially if you are not in shape. This is the most common cause of muscle pain. ?Injury or bruising. ?Infectious diseases, including diseases caused by viruses, such as the flu (influenza). ?Fibromyalgia.This is a long-term, or chronic, condition that causes muscle tenderness, tiredness (fatigue), and headache. ?Autoimmune or rheumatologic diseases. These are conditions, such as lupus, in which the body's defense system (immunesystem) attacks areas in the body. ?Certain medicines, including ACE inhibitors and statins. ?What are the signs or symptoms? ?The main symptom of this condition is sore or painful muscles, including during activity and when stretching. You may also have slight swelling. ?How is this diagnosed? ?This condition is diagnosed with a physical exam. Your health care provider will ask questions about your pain and when it began. If you have not had muscle pain for very long, your health care provider may want to wait before doing much testing. If your muscle pain has lasted a long time, tests may be done right away. In some cases, this may include tests to rule out certain conditions or illnesses. ?How is this treated? ?Treatment for this condition depends on the cause. Home care is often enough to  relieve muscle pain. Your health care provider may also prescribe NSAIDs, such as ibuprofen. ?Follow these instructions at home: ?Medicines ?Take over-the-counter and prescription medicines only as told by your health care provider. ?Ask your health care provider if the medicine prescribed to you requires you to avoid driving or using machinery. ?Managing pain, swelling, and discomfort ?  ?If directed, put ice on the painful area. To do this: ?Put ice in a plastic bag. ?Place a towel between your skin and the bag. ?Leave the ice on for 20 minutes, 2-3 times a day. ?For the first 2 days of muscle soreness, or if there is swelling: ?Do not soak in hot baths. ?Do not use a hot tub, steam room, sauna, heating pad, or other heat source. ?After 48-72 hours, you may alternate between applying ice and applying heat as told by your health care provider. If directed, apply heat to the affected area as often as told by your health care provider. Use the heat source that your health care provider recommends, such as a moist heat pack or a heating pad. ?Place a towel between your skin and the heat source. ?Leave the heat on for 20-30 minutes. ?Remove the heat if your skin turns bright red. This is especially important if you are unable to feel pain, heat, or cold. You may have a greater risk of getting burned. ?If you have an injury, raise (elevate) the injured area above the level of your heart while you are sitting or lying down. ?Activity ? ?If overuse is causing your muscle pain: ?Slow down your activities  until the pain goes away. ?Do regular, gentle exercises if you are not usually active. ?Warm up before exercising. Stretch before and after exercising. This can help lower the risk of muscle pain. ?Do not continue working out if the pain is severe. Severe pain could mean that you have injured a muscle. ?Do not lift anything that is heavier than 5-10 lb (2.3-4.5 kg), or the limit that you are told, until your health care  provider says that it is safe. ?Return to your normal activities as told by your health care provider. Ask your health care provider what activities are safe for you. ?General instructions ?Do not use any products that contain nicotine or tobacco, such as cigarettes, e-cigarettes, and chewing tobacco. These can delay healing. If you need help quitting, ask your health care provider. ?Keep all follow-up visits as told by your health care provider. This is important. ?Contact a health care provider if you have: ?Muscle pain that gets worse and medicines do not help. ?Muscle pain that lasts longer than 3 days. ?A rash or fever along with muscle pain. ?Muscle pain after a tick bite. ?Muscle pain while working out, even though you are in good physical condition. ?Redness, soreness, or swelling along with muscle pain. ?Muscle pain after starting a new medicine or changing the dose of a medicine. ?Get help right away if you have: ?Trouble breathing. ?Trouble swallowing. ?Muscle pain along with a stiff neck, fever, and vomiting. ?Severe muscle weakness or you cannot move part of your body. ?These symptoms may represent a serious problem that is an emergency. Do not wait to see if the symptoms will go away. Get medical help right away. Call your local emergency services (911 in the [place] Do not drive yourself to the hospital. ?Summary ?Muscle pain usually lasts only a short time and goes away without treatment. ?This condition is caused by using muscles in a new or different way after a period of inactivity. ?If your muscle pain lasts longer than 3 days, tell your health care provider. ?This information is not intended to replace advice given to you by your health care provider. Make sure you discuss any questions you have with your health care provider. ?Document Revised: 01/24/2021 Document Reviewed: 01/24/2021 ?Elsevier Patient Education ? [place]. ? ?

## 2021-10-11 NOTE — Progress Notes (Signed)
? ?Virtual Visit Consent  ? ?Jacqueline Gonzalez, you are scheduled for a virtual visit with Mary-Margaret Hassell Done, [place], a [facility] provider, today.   ?  ?Just as with appointments in the office, your consent must be obtained to participate.  Your consent will be active for this visit and any virtual visit you may have with one of our providers in the next 365 days.   ?  ?If you have a MyChart account, a copy of this consent can be sent to you electronically.  All virtual visits are billed to your insurance company just like a traditional visit in the office.   ? ?As this is a virtual visit, video technology does not allow for your provider to perform a traditional examination.  This may limit your provider's ability to fully assess your condition.  If your provider identifies any concerns that need to be evaluated in person or the need to arrange testing (such as labs, EKG, etc.), we will make arrangements to do so.   ?  ?Although advances in technology are sophisticated, we cannot ensure that it will always work on either your end or our end.  If the connection with a video visit is poor, the visit may have to be switched to a telephone visit.  With either a video or telephone visit, we are not always able to ensure that we have a secure connection.    ? ?I need to obtain your verbal consent now.   Are you willing to proceed with your visit today? YES ?  ?Jacqueline Gonzalez has provided verbal consent on 10/11/2021 for a virtual visit (video or telephone). ?  ?Mary-Margaret Hassell Done, FNP  ? ?Date: 10/11/2021 11:50 AM ? ? ?Virtual Visit via Video Note  ? ?I, Mary-Margaret Hassell Done, connected with Jacqueline Gonzalez ([id number], 07-Nov-2000) on 10/11/21 at 12:00 PM EDT by a video-enabled telemedicine application and verified that I am speaking with the correct person using two identifiers. ? ?Location: ?Patient: Virtual Visit Location Patient: Home ?Provider: Virtual Visit Location Provider: Mobile ?  ?I discussed the  limitations of evaluation and management by telemedicine and the availability of in person appointments. The patient expressed understanding and agreed to proceed.   ? ?History of Present Illness: ?Jacqueline Gonzalez is a 21 y.o. who identifies as a female who was assigned female at birth, and is being seen today for abdominal pain. ? ?HPI: Patient c/o body aches and headache. This started yesterday afternoon. She has not taken anything. ?  ?Review of Systems  ?Constitutional:  Negative for chills and fever.  ?HENT:  Negative for congestion and sore throat.   ?Respiratory:  Negative for cough.   ?Musculoskeletal:  Positive for myalgias.  ?Neurological:  Positive for headaches.  ? ?Problems:  ?Patient Active Problem List  ? Diagnosis Date Noted  ? 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 Low back pain 05/02/2021  ? Headache 09/03/2020  ? Nonspecific abdominal pain 08/30/2020  ? Ganglion cyst of dorsum of left wrist 11/03/2019  ? Constipation 01/21/2018  ? Irregular periods 01/21/2018  ? Generalized anxiety disorder 01/21/2018  ?  ?Allergies:  ?Allergies  ?Allergen Reactions  ? Blue Dyes (Parenteral) Anaphylaxis  ? Blueberry [Vaccinium Angustifolium] Anaphylaxis  ? Raspberry Anaphylaxis  ? Shellfish Allergy Shortness Of Breath  ?  ALL SHELLFISH  ? ?Medications:  ?Current Outpatient Medications:  ?  albuterol (VENTOLIN HFA) 108 (90 Base) MCG/ACT inhaler, Inhale 2 puffs into the lungs every 6 (six) hours as needed for  wheezing or shortness of breath., Disp: 8 g, Rfl: 0 ?  benzonatate (TESSALON) 100 MG capsule, Take 1 capsule (100 mg total) by mouth 3 (three) times daily as needed., Disp: 30 capsule, Rfl: 0 ?  dicyclomine (BENTYL) 20 MG tablet, Take 1 tablet (20 mg total) by mouth 2 (two) times daily., Disp: 30 tablet, Rfl: 0 ?  famotidine (PEPCID) 20 MG tablet, Take 1 tablet (20 mg total) by mouth 2 (two) times daily., Disp: 60 tablet, Rfl: 0 ?  FLUoxetine (PROZAC) 20 MG capsule, Take 1  capsule (20 mg total) by mouth daily., Disp: 30 capsule, Rfl: 2 ?  fluticasone (FLONASE) 50 MCG/ACT nasal spray, Place 2 sprays into both nostrils daily., Disp: 16 g, Rfl: 0 ?  hydrOXYzine (ATARAX) 10 MG tablet, Take 1 tablet (10 mg total) by mouth 3 (three) times daily as needed., Disp: 75 tablet, Rfl: 1 ?  naproxen (NAPROSYN) 500 MG tablet, Take 1 tablet (500 mg total) by mouth 2 (two) times daily with a meal., Disp: 30 tablet, Rfl: 0 ?  Olopatadine HCl 0.2 % SOLN, Apply 1 drop to eye daily., Disp: 2.5 mL, Rfl: 0 ?  ondansetron (ZOFRAN-ODT) 4 MG disintegrating tablet, Take 1 tablet (4 mg total) by mouth every 8 (eight) hours as needed for nausea or vomiting., Disp: 20 tablet, Rfl: 0 ?  polyethylene glycol powder (GLYCOLAX/MIRALAX) 17 GM/SCOOP powder, Take 17 g by mouth in the morning and at bedtime., Disp: 850 g, Rfl: 3 ?  senna (SENOKOT) 8.6 MG TABS tablet, Take 1 tablet (8.6 mg total) by mouth daily., Disp: 120 tablet, Rfl: 0 ?  trimethoprim-polymyxin b (POLYTRIM) ophthalmic solution, Place 1 drop into the right eye every 4 (four) hours., Disp: 10 mL, Rfl: 0 ? ?Observations/Objective: ?Patient is well-developed, well-nourished in no acute distress.  ?Resting comfortably  at home.  ?Head is normocephalic, atraumatic.  ?No labored breathing.  ?Speech is clear and coherent with logical content.  ?Patient is alert and oriented at baseline.  ? ? ?Assessment and Plan: ? ?Jacqueline Gonzalez in today with chief complaint of Abdominal Pain ? ? ?1. Viral syndrome ?2. Myalgia ?Rest ?Force fluids ?Meds ordered this encounter  ?Medications  ? naproxen (NAPROSYN) 500 MG tablet  ?  Sig: Take 1 tablet (500 mg total) by mouth 2 (two) times daily with a meal.  ?  Dispense:  60 tablet  ?  Refill:  1  ?  Order Specific Question:   Supervising Provider  ?  Answer:   Noemi Chapel [[id number]]  ? ? ? ? ?Follow Up Instructions: ?I discussed the assessment and treatment plan with the patient. The patient was provided an opportunity to ask  questions and all were answered. The patient agreed with the plan and demonstrated an understanding of the instructions.  A copy of instructions were sent to the patient via MyChart. ? ?The patient was advised to call back or seek an in-person evaluation if the symptoms worsen or if the condition fails to improve as anticipated. ? ?Time:  ?I spent 8 minutes with the patient via telehealth technology discussing the above problems/concerns.   ? ?Mary-Margaret Hassell Done, FNP ? ?

## 2021-10-19 ENCOUNTER — Telehealth: Payer: Medicaid Other | Admitting: Family

## 2021-10-19 DIAGNOSIS — J069 Acute upper respiratory infection, unspecified: Secondary | ICD-10-CM | POA: Diagnosis not present

## 2021-10-19 MED ORDER — FLUTICASONE PROPIONATE 50 MCG/ACT NA SUSP: 2.0000 | Freq: Every day | NASAL | 6 refills | Status: DC

## 2021-10-19 MED ORDER — BENZONATATE 100 MG PO CAPS: 100.0000 mg | ORAL_CAPSULE | Freq: Three times a day (TID) | ORAL | 0 refills | Status: DC | PRN

## 2021-10-19 NOTE — Progress Notes (Signed)

## 2021-10-23 ENCOUNTER — Ambulatory Visit (HOSPITAL_COMMUNITY): Payer: Medicaid Other | Admitting: Clinical

## 2021-11-01 ENCOUNTER — Encounter (HOSPITAL_COMMUNITY): Payer: Self-pay | Admitting: *Deleted

## 2021-11-01 ENCOUNTER — Telehealth: Payer: Medicaid Other | Admitting: Family Medicine

## 2021-11-01 ENCOUNTER — Other Ambulatory Visit: Payer: Self-pay

## 2021-11-01 ENCOUNTER — Emergency Department (HOSPITAL_COMMUNITY)
Admission: EM | Admit: 2021-11-01 | Discharge: 2021-11-01 | Disposition: A | Discharge status: Home / Self Care | Payer: Medicaid Other | Attending: Emergency Medicine | Admitting: Emergency Medicine

## 2021-11-01 DIAGNOSIS — R109 Unspecified abdominal pain: Secondary | ICD-10-CM | POA: Diagnosis not present

## 2021-11-01 DIAGNOSIS — N920 Excessive and frequent menstruation with regular cycle: Secondary | ICD-10-CM

## 2021-11-01 DIAGNOSIS — N939 Abnormal uterine and vaginal bleeding, unspecified: Secondary | ICD-10-CM | POA: Diagnosis present

## 2021-11-01 DIAGNOSIS — N922 Excessive menstruation at puberty: Secondary | ICD-10-CM

## 2021-11-01 LAB — CBC WITH DIFFERENTIAL/PLATELET
Abs Immature Granulocytes: 0.01 10*3/uL (ref 0.00–0.07)
Basophils Absolute: 0 10*3/uL (ref 0.0–0.1)
Basophils Relative: 0 %
Eosinophils Absolute: 0.1 10*3/uL (ref 0.0–0.5)
Eosinophils Relative: 2 %
HCT: 35.9 % — ABNORMAL LOW (ref 36.0–46.0)
Hemoglobin: 11.7 g/dL — ABNORMAL LOW (ref 12.0–15.0)
Immature Granulocytes: 0 %
Lymphocytes Relative: 39 %
Lymphs Abs: 2.6 10*3/uL (ref 0.7–4.0)
MCH: 28.5 pg (ref 26.0–34.0)
MCHC: 32.6 g/dL (ref 30.0–36.0)
MCV: 87.6 fL (ref 80.0–100.0)
Monocytes Absolute: 0.4 10*3/uL (ref 0.1–1.0)
Monocytes Relative: 7 %
Neutro Abs: 3.6 10*3/uL (ref 1.7–7.7)
Neutrophils Relative %: 52 %
Platelets: 248 10*3/uL (ref 150–400)
RBC: 4.1 MIL/uL (ref 3.87–5.11)
RDW: 12.7 % (ref 11.5–15.5)
WBC: 6.8 10*3/uL (ref 4.0–10.5)
nRBC: 0 % (ref 0.0–0.2)

## 2021-11-01 LAB — COMPREHENSIVE METABOLIC PANEL
ALT: 30 U/L (ref 0–44)
AST: 20 U/L (ref 15–41)
Albumin: 3.9 g/dL (ref 3.5–5.0)
Alkaline Phosphatase: 74 U/L (ref 38–126)
Anion gap: 6 (ref 5–15)
BUN: 7 mg/dL (ref 6–20)
CO2: 22 mmol/L (ref 22–32)
Calcium: 8.9 mg/dL (ref 8.9–10.3)
Chloride: 109 mmol/L (ref 98–111)
Creatinine, Ser: 0.65 mg/dL (ref 0.44–1.00)
GFR, Estimated: >60 mL/min (ref >60)
Glucose, Bld: 108 mg/dL — ABNORMAL HIGH (ref 70–99)
Potassium: 4 mmol/L (ref 3.5–5.1)
Sodium: 137 mmol/L (ref 135–145)
Total Bilirubin: 0.4 mg/dL (ref 0.3–1.2)
Total Protein: 7.9 g/dL (ref 6.5–8.1)

## 2021-11-01 LAB — URINALYSIS, ROUTINE W REFLEX MICROSCOPIC
Bacteria, UA: NONE SEEN
Bilirubin Urine: NEGATIVE
Glucose, UA: NEGATIVE mg/dL
Ketones, ur: NEGATIVE mg/dL
Leukocytes,Ua: NEGATIVE
Nitrite: NEGATIVE
Protein, ur: NEGATIVE mg/dL
RBC / HPF: >50 RBC/hpf — ABNORMAL HIGH (ref 0–5)
Specific Gravity, Urine: 1.015 (ref 1.005–1.030)
pH: 5 (ref 5.0–8.0)

## 2021-11-01 LAB — LIPASE, BLOOD: Lipase: 23 U/L (ref 11–51)

## 2021-11-01 LAB — I-STAT BETA HCG BLOOD, ED (MC, WL, AP ONLY): I-stat hCG, quantitative: <5 m[IU]/mL (ref <5)

## 2021-11-01 MED ORDER — KETOROLAC TROMETHAMINE 15 MG/ML IJ SOLN
15.0000 mg | Freq: Once | INTRAMUSCULAR | Status: AC
Start: 2021-11-01 — End: 2021-11-01
  Administered 2021-11-01: 15 mg via INTRAMUSCULAR
  Filled 2021-11-01: qty 1

## 2021-11-01 NOTE — Patient Instructions (Signed)
Abnormal Uterine Bleeding ? ?Abnormal uterine bleeding is unusual bleeding from the uterus. It includes bleeding after sex, or bleeding or spotting between menstrual periods. It may also include bleeding that is heavier than normal, menstrual periods that last longer than usual, or bleeding that occurs after menopause. ?Abnormal uterine bleeding can affect teenagers, women in their reproductive years, pregnant women, and women who have reached menopause. Common causes of abnormal uterine bleeding include: ?Pregnancy. ?Abnormal growths within the lining of the uterus (polyps). ?Benign tumors or growths in the uterus (fibroids). These are not cancer. ?Infection. ?Cancer. ?Too much or too little of some hormones in the body (hormonal imbalances). ?Any type of abnormal bleeding should be checked by a health care provider. Many cases are minor and simple to treat, but others may be more serious. Treatment will depend on the cause of the bleeding and how severe it is. ?Follow these instructions at home: ?Medicines ?Take over-the-counter and prescription medicines only as told by your health care provider. ?Ask your health care provider about: ?Taking medicines such as aspirin and ibuprofen. These medicines can thin your blood. Do not take these medicines unless your health care provider tells you to take them. ?Taking over-the-counter medicines, vitamins, herbs, and supplements. ?If you were prescribed iron pills, take them as told by your health care provider. Iron pills help to replace iron that your body loses because of this condition. ?Managing constipation ?In cases of severe bleeding, you may be asked to increase your iron intake to treat anemia. Doing this may cause constipation. To prevent or treat constipation, you may need to: ?Drink enough fluid to keep your urine pale yellow. ?Take over-the-counter or prescription medicines. ?Eat foods that are high in fiber, such as beans, whole grains, and fresh fruits and  vegetables. ?Limit foods that are high in fat and processed sugars, such as fried or sweet foods. ?Activity ?Alter your activity to decrease bleeding if you need to change your sanitary pad more than one time every 2 hours: ?Lie in bed with your feet raised (elevated). ?Place a cold pack on your lower abdomen. ?Rest as much as possible until the bleeding stops or slows down. ?General instructions ?Do not use tampons, douche, or have sex until your health care provider says these things are okay. ?Change your sanitary pads often. ?Get regular exams. These include pelvic exams and cervical cancer screenings. ?It is up to you to get the results of any tests that are done. Ask your health care provider, or the department that is doing the tests, when your results will be ready. ?Monitor your condition for any changes. For 2 months, write down: ?When your menstrual period starts. ?When your menstrual period ends. ?When any abnormal vaginal bleeding occurs. ?What problems you notice. ?Keep all follow-up visits. This is important. ?Contact a health care provider if: ?You have bleeding that lasts for more than one week. ?You feel dizzy at times. ?You feel nauseous or you vomit. ?You feel light-headed or weak. ?You notice any other changes that show that your condition is getting worse. ?Get help right away if: ?You faint. ?You have bleeding that soaks through a sanitary pad every hour. ?You have pain in the abdomen. ?You have a fever or chills. ?You become sweaty or weak. ?You pass large blood clots from your vagina. ?These symptoms may represent a serious problem that is an emergency. Do not wait to see if the symptoms will go away. Get medical help right away. Call your local emergency services (  911 in the [place] Do not drive yourself to the hospital. ?Summary ?Abnormal uterine bleeding is unusual bleeding from the uterus. ?Any type of abnormal bleeding should be checked by a health care provider. Many cases are minor and  simple to treat, but others may be more serious. ?Treatment will depend on the cause of the bleeding and how severe it is. ?Get help right away if you faint, you have bleeding that soaks through a sanitary pad every hour, or you pass large blood clots from your vagina. ?This information is not intended to replace advice given to you by your health care provider. Make sure you discuss any questions you have with your health care provider. ?Document Revised: 11/05/2020 Document Reviewed: 11/05/2020 ?Elsevier Patient Education ? [place]. ? ?

## 2021-11-01 NOTE — ED Provider Triage Note (Signed)
Emergency Medicine Provider Triage Evaluation Note ? ?Jacqueline Gonzalez , a 21 y.o. female  was evaluated in triage.  Pt complains of heavy vaginal bleeding and abdominal cramping since yesterday.  Patient states that she has regular menstrual cycles, believes she is having a very heavy period.  She took Plan B last week.  States that she soaked through multiple pads, her close, and her sheets.  She passed a large blood clot earlier. ? ?Review of Systems  ?Positive: Vaginal bleeding, abdominal cramping ?Negative: Fever, vomiting, urinary sx ? ?Physical Exam  ?BP (!) 138/104 (BP Location: Left Arm)   Pulse 93   Temp 98.1 ?F (36.7 ?C) (Oral)   Ht 5' 9"  (1.753 m)   Wt 107 kg   SpO2 98%   BMI 34.85 kg/m?  ?Gen:   Awake, no distress   ?Resp:  Normal effort  ?MSK:   Moves extremities without difficulty  ?Other:   ? ?Medical Decision Making  ?Medically screening exam initiated at 2:58 PM.  Appropriate orders placed.  Jacqueline Gonzalez was informed that the remainder of the evaluation will be completed by another provider, this initial triage assessment does not replace that evaluation, and the importance of remaining in the ED until their evaluation is complete. ? ? ?  ?Jacqueline Plummer, PA-C ?11/01/21 1459 ? ?

## 2021-11-01 NOTE — ED Provider Notes (Signed)
?[facility] DEPT ?Provider Note ? ? ?CSN: [id number] ?Arrival date & time: 11/01/21  1419 ? ?  ? ?History ? ?Chief Complaint  ?Patient presents with  ? Vaginal Bleeding  ? ? ?Jacqueline Gonzalez is a 21 y.o. female who presents to the ER with complaints of heavy vaginal bleeding and abdominal cramping since yesterday.  Patient states that she has regular menstrual cycles, believes she is having a very heavy period.  She took Plan B last week.  States that she soaked through multiple pads, her close, and her sheets.  She passed a large blood clot earlier. No fever, vomiting, diarrhea, or urinary symptoms.  ? ? ?Vaginal Bleeding ?Associated symptoms: abdominal pain   ?Associated symptoms: no fever and no nausea   ? ?  ? ?Home Medications ?Prior to Admission medications   ?Medication Sig Start Date End Date Taking? Authorizing Provider  ?albuterol (VENTOLIN HFA) 108 (90 Base) MCG/ACT inhaler Inhale 2 puffs into the lungs every 6 (six) hours as needed for wheezing or shortness of breath. 08/05/21   Mar Daring, PA-C  ?benzonatate (TESSALON PERLES) 100 MG capsule Take 1 capsule (100 mg total) by mouth 3 (three) times daily as needed. 10/19/21   Sharion Balloon, FNP  ?dicyclomine (BENTYL) 20 MG tablet Take 1 tablet (20 mg total) by mouth 2 (two) times daily. 09/01/21   Elba Barman, DO  ?famotidine (PEPCID) 20 MG tablet Take 1 tablet (20 mg total) by mouth 2 (two) times daily. 08/27/21   Lurline Del, DO  ?FLUoxetine (PROZAC) 20 MG capsule Take 1 capsule (20 mg total) by mouth daily. 09/12/21 09/12/22  Malachy Mood, PA  ?fluticasone (FLONASE) 50 MCG/ACT nasal spray Place 2 sprays into both nostrils daily. 10/19/21   Sharion Balloon, FNP  ?hydrOXYzine (ATARAX) 10 MG tablet Take 1 tablet (10 mg total) by mouth 3 (three) times daily as needed. 08/01/21   Nwoko, Terese Door, PA  ?naproxen (NAPROSYN) 500 MG tablet Take 1 tablet (500 mg total) by mouth 2 (two) times daily with a meal. 10/11/21    Chevis Pretty, FNP  ?Olopatadine HCl 0.2 % SOLN Apply 1 drop to eye daily. 09/11/21   Mar Daring, PA-C  ?ondansetron (ZOFRAN-ODT) 4 MG disintegrating tablet Take 1 tablet (4 mg total) by mouth every 8 (eight) hours as needed for nausea or vomiting. 08/14/21   Brunetta Jeans, PA-C  ?polyethylene glycol powder (GLYCOLAX/MIRALAX) 17 GM/SCOOP powder Take 17 g by mouth in the morning and at bedtime. 07/23/21   Zola Button, MD  ?senna (SENOKOT) 8.6 MG TABS tablet Take 1 tablet (8.6 mg total) by mouth daily. 07/23/21   Zola Button, MD  ?trimethoprim-polymyxin b (POLYTRIM) ophthalmic solution Place 1 drop into the right eye every 4 (four) hours. 09/11/21   Mar Daring, PA-C  ?   ? ?Allergies    ?Blue dyes (parenteral), Blueberry [vaccinium angustifolium], Raspberry, and Shellfish allergy   ? ?Review of Systems   ?Review of Systems  ?Constitutional:  Negative for chills and fever.  ?Respiratory:  Negative for shortness of breath.   ?Cardiovascular:  Negative for chest pain.  ?Gastrointestinal:  Positive for abdominal pain. Negative for constipation, diarrhea, nausea and vomiting.  ?Genitourinary:  Positive for vaginal bleeding.  ?Neurological:  Negative for syncope and weakness.  ?All other systems reviewed and are negative. ? ?Physical Exam ?Updated Vital Signs ?BP (!) 138/104 (BP Location: Left Arm)   Pulse 93   Temp 98.1 ?F (36.7 ?C) (Oral)  Ht 5' 9"  (1.753 m)   Wt 107 kg   SpO2 98%   BMI 34.85 kg/m?  ?Physical Exam ?Vitals and nursing note reviewed.  ?Constitutional:   ?   Appearance: Normal appearance.  ?HENT:  ?   Head: Normocephalic and atraumatic.  ?Eyes:  ?   Conjunctiva/sclera: Conjunctivae normal.  ?Cardiovascular:  ?   Rate and Rhythm: Normal rate and regular rhythm.  ?Pulmonary:  ?   Effort: Pulmonary effort is normal. No respiratory distress.  ?   Breath sounds: Normal breath sounds.  ?Abdominal:  ?   General: There is no distension.  ?   Palpations: Abdomen is soft.  ?    Tenderness: There is no abdominal tenderness.  ?Skin: ?   General: Skin is warm and dry.  ?Neurological:  ?   General: No focal deficit present.  ?   Mental Status: She is alert.  ? ? ?ED Results / Procedures / Treatments   ?Labs ?(all labs ordered are listed, but only abnormal results are displayed) ?Labs Reviewed  ?URINALYSIS, ROUTINE W REFLEX MICROSCOPIC - Abnormal; Notable for the following components:  ?    Result Value  ? Hgb urine dipstick MODERATE (*)   ? RBC / HPF >50 (*)   ? All other components within normal limits  ?CBC WITH DIFFERENTIAL/PLATELET - Abnormal; Notable for the following components:  ? Hemoglobin 11.7 (*)   ? HCT 35.9 (*)   ? All other components within normal limits  ?COMPREHENSIVE METABOLIC PANEL - Abnormal; Notable for the following components:  ? Glucose, Bld 108 (*)   ? All other components within normal limits  ?LIPASE, BLOOD  ?I-STAT BETA HCG BLOOD, ED ([facility], [facility], [facility] ONLY)  ? ? ?EKG ?None ? ?Radiology ?No results found. ? ?Procedures ?Procedures  ? ? ?Medications Ordered in ED ?Medications  ?ketorolac (TORADOL) 15 MG/ML injection 15 mg (has no administration in time range)  ? ? ?ED Course/ Medical Decision Making/ A&P ?  ?                        ?Medical Decision Making ?Risk ?Prescription drug management. ? ? ?This patient presents to the ED for concern of vaginal bleeding, this involves an extensive number of treatment options, and is a complaint that carries with it a high risk of complications and morbidity. The emergent differential diagnosis prior to evaluation includes, but is not limited to,  Abnormal uterine bleeding, threatened miscarriage, incomplete miscarriage, normal bleeding from an early trimester pregnancy, ectopic pregnancy, vaginal/cervical trauma, subchorionic hemorrhage/hematoma. This is not an exhaustive differential.  ? ?Past Medical History / Co-morbidities / Social History: ?IBS, GERD ? ?Physical Exam: ?Physical exam performed. The pertinent findings include:  Patient is afebrile, not tachycardic, no acute distress.  Abdomen is soft, nontender. ? ?Lab Tests: ?I ordered, and personally interpreted labs.  The pertinent results include:  Hemoglobin stable at 11.7. No leukocytosis. Electrolytes normal. Beta HCG < 5. Urinalysis with moderate hemoglobin, likely contaminant from vaginal bleeding.  ?  ?Medications: ?I ordered medication including toradol for abdominal cramping. Reevaluation of the patient after these medicines showed that the patient improved. I have reviewed the patients home medicines and have made adjustments as needed. ? ?Disposition: ?After consideration of the diagnostic results and the patients response to treatment, I feel that patient is not requiring admission or inpatient treatment for her symptoms. We discussed that heavy and painful vaginal bleeding after taking Plan B is common. Her vital signs  and lab work is all reassuring today.  Negative pregnancy test, so I have low suspicion for other acute etiology such as ectopic pregnancy.  I recommended she follow up with OBGYN and gave close return precautions. Patient is agreeable to the plan.  ? ?I discussed this case with my attending physician Dr. Armandina Gemma who cosigned this note including patient's presenting symptoms, physical exam, and planned diagnostics and interventions. Attending physician stated agreement with plan or made changes to plan which were implemented.    ? ?Final Clinical Impression(s) / ED Diagnoses ?Final diagnoses:  ?Vaginal bleeding  ? ? ?Rx / DC Orders ?ED Discharge Orders   ? ? None  ? ?  ? ?Portions of this report may have been transcribed using voice recognition software. Every effort was made to ensure accuracy; however, inadvertent computerized transcription errors may be present. ? ?  ?Kateri Plummer, PA-C ?11/01/21 1654 ? ?  ?Regan Lemming, MD ?11/01/21 1958 ? ?

## 2021-11-01 NOTE — Discharge Instructions (Signed)
You were seen in the emergency department for vaginal bleeding. ? ?As we discussed, this is a very common side effect of the Plan B medication. I believe the bleeding should slow down soon.  ? ?Please use Tylenol or ibuprofen for pain.  You may use 600 mg ibuprofen every 6 hours or 1000 mg of Tylenol every 6 hours.  You may choose to alternate between the two, this would be most effective. Do not exceed 4000 mg of Tylenol within 24 hours.  Do not exceed 3200 mg ibuprofen within 24 hours. ? ?[facility]  ? ?[facility]             ?[address], [place], [place] [address] ?[phone] ? ?[facility] at [facility]                                                             ?[address], [address], [place], [place], [address] ?[phone] ? ?[facility]                                    ?[place], [place], [place], [place], [address] ?[phone] ? ?[facility] at [facility] ?[address], [place], [place], [place], [address] ?[phone] ? ?[facility] [employer] [facility] at [facility]                                 ?[place], [place], [place], [address] ?[phone] ? ?[facility] at [facility]                                    ?[address], [place], [place], [address] ?[phone] ? ?[facility] [employer] [facility] at [facility] ?[address], [address], [place], [place], [address]                             ? ?[facility] ?[address] [place], [place], [place], [address] ?[phone] ? ?[facility]         ?Phone: [phone] ? ?[facility]      ?Phone: [phone]  ? ?[facility]      ?Phone: [phone] ? ?[facility] Department-Family Planning         ?Phone: [phone]  ? ?[facility]  Department-Maternity    ?Phone: [phone] ? ?[facility]      ?Phone: [phone] ? ?[facility]     ?Phone: [phone] ? ?[facility]        ?Phone: [phone] ? ?[facility]      ?Phone: [phone] ?

## 2021-11-01 NOTE — ED Triage Notes (Signed)
BIB EMS for Vaginal bleeding x 2 days, passing clots and more cramping than usual. Unsure about pregnancy, took Plan B about a week ago. 140/90-88-20-97% RA ?

## 2021-11-01 NOTE — Progress Notes (Signed)
?Virtual Visit Consent  ? ?[name], you are scheduled for a virtual visit with a [facility] provider today.   ?  ?Just as with appointments in the office, your consent must be obtained to participate.  Your consent will be active for this visit and any virtual visit you may have with one of our providers in the next 365 days.   ?  ?If you have a MyChart account, a copy of this consent can be sent to you electronically.  All virtual visits are billed to your insurance company just like a traditional visit in the office.   ? ?As this is a virtual visit, video technology does not allow for your provider to perform a traditional examination.  This may limit your provider's ability to fully assess your condition.  If your provider identifies any concerns that need to be evaluated in person or the need to arrange testing (such as labs, EKG, etc.), we will make arrangements to do so.   ?  ?Although advances in technology are sophisticated, we cannot ensure that it will always work on either your end or our end.  If the connection with a video visit is poor, the visit may have to be switched to a telephone visit.  With either a video or telephone visit, we are not always able to ensure that we have a secure connection.    ? ?I need to obtain your verbal consent now.   Are you willing to proceed with your visit today?  ?  ?Jacqueline Gonzalez has provided verbal consent on 11/01/2021 for a virtual visit (video or telephone). ?  ?Dellia Nims, FNP  ? ?Date: 11/01/2021 1:18 PM ? ? ?Virtual Visit via Video Note  ? ?IDellia Nims, connected with  Jacqueline Gonzalez  ([id number], 2000-09-11) on 11/01/21 at  1:15 PM EDT by a video-enabled telemedicine application and verified that I am speaking with the correct person using two identifiers. ? ?Location: ?Patient: Virtual Visit Location Patient: Home ?Provider: Virtual Visit Location Provider: Home Office ?  ?I discussed the limitations of evaluation and management by telemedicine  and the availability of in person appointments. The patient expressed understanding and agreed to proceed.   ? ?History of Present Illness: ?Jacqueline Gonzalez is a 21 y.o. who identifies as a female who was assigned female at birth, and is being seen today for heavy menstrual bleeding. Period started yesterday and she is bleeding through a large pad every hour. She woke this am with the bed soaked with blood. She is having to wear a depends and says that is still not handling the problem.  ?HPI: HPI  ?Problems:  ?Patient Active Problem List  ? Diagnosis Date Noted  ? Moderate episode of recurrent major depressive disorder ([place]) 06/23/2021  ? Low back pain 05/02/2021  ? Headache 09/03/2020  ? Nonspecific abdominal pain 08/30/2020  ? Ganglion cyst of dorsum of left wrist 11/03/2019  ? Constipation 01/21/2018  ? Irregular periods 01/21/2018  ? Generalized anxiety disorder 01/21/2018  ?  ?Allergies:  ?Allergies  ?Allergen Reactions  ? Blue Dyes (Parenteral) Anaphylaxis  ? Blueberry [Vaccinium Angustifolium] Anaphylaxis  ? Raspberry Anaphylaxis  ? Shellfish Allergy Shortness Of Breath  ?  ALL SHELLFISH  ? ?Medications:  ?Current Outpatient Medications:  ?  albuterol (VENTOLIN HFA) 108 (90 Base) MCG/ACT inhaler, Inhale 2 puffs into the lungs every 6 (six) hours as needed for wheezing or shortness of breath., Disp: 8 g, Rfl: 0 ?  benzonatate (TESSALON PERLES) 100 MG  capsule, Take 1 capsule (100 mg total) by mouth 3 (three) times daily as needed., Disp: 20 capsule, Rfl: 0 ?  dicyclomine (BENTYL) 20 MG tablet, Take 1 tablet (20 mg total) by mouth 2 (two) times daily., Disp: 30 tablet, Rfl: 0 ?  famotidine (PEPCID) 20 MG tablet, Take 1 tablet (20 mg total) by mouth 2 (two) times daily., Disp: 60 tablet, Rfl: 0 ?  FLUoxetine (PROZAC) 20 MG capsule, Take 1 capsule (20 mg total) by mouth daily., Disp: 30 capsule, Rfl: 2 ?  fluticasone (FLONASE) 50 MCG/ACT nasal spray, Place 2 sprays into both nostrils daily., Disp: 16 g, Rfl: 6 ?   hydrOXYzine (ATARAX) 10 MG tablet, Take 1 tablet (10 mg total) by mouth 3 (three) times daily as needed., Disp: 75 tablet, Rfl: 1 ?  naproxen (NAPROSYN) 500 MG tablet, Take 1 tablet (500 mg total) by mouth 2 (two) times daily with a meal., Disp: 60 tablet, Rfl: 1 ?  Olopatadine HCl 0.2 % SOLN, Apply 1 drop to eye daily., Disp: 2.5 mL, Rfl: 0 ?  ondansetron (ZOFRAN-ODT) 4 MG disintegrating tablet, Take 1 tablet (4 mg total) by mouth every 8 (eight) hours as needed for nausea or vomiting., Disp: 20 tablet, Rfl: 0 ?  polyethylene glycol powder (GLYCOLAX/MIRALAX) 17 GM/SCOOP powder, Take 17 g by mouth in the morning and at bedtime., Disp: 850 g, Rfl: 3 ?  senna (SENOKOT) 8.6 MG TABS tablet, Take 1 tablet (8.6 mg total) by mouth daily., Disp: 120 tablet, Rfl: 0 ?  trimethoprim-polymyxin b (POLYTRIM) ophthalmic solution, Place 1 drop into the right eye every 4 (four) hours., Disp: 10 mL, Rfl: 0 ? ?Observations/Objective: ?Patient is well-developed, well-nourished in no acute distress.  ?Resting at home.  ?Head is normocephalic, atraumatic.  ?No labored breathing.  ?Speech is clear and coherent with logical content.  ?Patient is alert and oriented at baseline.  ? ? ?Assessment and Plan: ?1. Excessive menstruation at puberty ? ?Instructed to proceed to ED for further eval.  ? ?Follow Up Instructions: ?I discussed the assessment and treatment plan with the patient. The patient was provided an opportunity to ask questions and all were answered. The patient agreed with the plan and demonstrated an understanding of the instructions.  A copy of instructions were sent to the patient via MyChart unless otherwise noted below.  ? ? ? ?The patient was advised to call back or seek an in-person evaluation if the symptoms worsen or if the condition fails to improve as anticipated. ? ?Time:  ?I spent 8 minutes with the patient via telehealth technology discussing the above problems/concerns.   ? ?Dellia Nims, FNP  ?

## 2021-11-03 ENCOUNTER — Telehealth: Payer: Medicaid Other | Admitting: Nurse Practitioner

## 2021-11-03 ENCOUNTER — Encounter: Payer: Medicaid Other | Admitting: Nurse Practitioner

## 2021-11-03 DIAGNOSIS — N946 Dysmenorrhea, unspecified: Secondary | ICD-10-CM | POA: Diagnosis not present

## 2021-11-03 NOTE — Progress Notes (Signed)
Patient canceled VV ? ?

## 2021-11-03 NOTE — Progress Notes (Signed)
?Virtual Visit Consent  ? ?Jacqueline Gonzalez, you are scheduled for a virtual visit with a [facility] provider today.   ?  ?Just as with appointments in the office, your consent must be obtained to participate.  Your consent will be active for this visit and any virtual visit you may have with one of our providers in the next 365 days.   ?  ?If you have a MyChart account, a copy of this consent can be sent to you electronically.  All virtual visits are billed to your insurance company just like a traditional visit in the office.   ? ?As this is a virtual visit, video technology does not allow for your provider to perform a traditional examination.  This may limit your provider's ability to fully assess your condition.  If your provider identifies any concerns that need to be evaluated in person or the need to arrange testing (such as labs, EKG, etc.), we will make arrangements to do so.   ?  ?Although advances in technology are sophisticated, we cannot ensure that it will always work on either your end or our end.  If the connection with a video visit is poor, the visit may have to be switched to a telephone visit.  With either a video or telephone visit, we are not always able to ensure that we have a secure connection.    ? ?I need to obtain your verbal consent now.   Are you willing to proceed with your visit today?  ?  ?Jacqueline Gonzalez has provided verbal consent on 11/03/2021 for a virtual visit (video or telephone). ?  ?Apolonio Schneiders, FNP  ? ?Date: 11/03/2021 4:51 PM ? ? ?Virtual Visit via Video Note  ? ?IApolonio Schneiders, connected with  Jacqueline Gonzalez  ([id number], 09-Oct-2000) on 11/03/21 at  5:00 PM EDT by a video-enabled telemedicine application and verified that I am speaking with the correct person using two identifiers. ? ?Location: ?Patient: Virtual Visit Location Patient: Home ?Provider: Virtual Visit Location Provider: Home Office ?  ?I discussed the limitations of evaluation and management by  telemedicine and the availability of in person appointments. The patient expressed understanding and agreed to proceed.   ? ?History of Present Illness: ?Jacqueline Gonzalez is a 21 y.o. who identifies as a female who was assigned female at birth, and is being seen today with complaints of ongoing painful menses.  ?She was seen at the ED 2 days ago.  ?She was given a Toradol injection for pain relief.  ?She was instructed to continue ibuprofen. She has been taking 1251m daily  ? ?She has not seen an OBGYN in the past, she is under the care of Dr. GLarae Groomsas her PCP.  ? ? ? ?Problems:  ?Patient Active Problem List  ? Diagnosis Date Noted  ? Moderate episode of recurrent major depressive disorder ([place] 06/23/2021  ? Low back pain 05/02/2021  ? Headache 09/03/2020  ? Nonspecific abdominal pain 08/30/2020  ? Ganglion cyst of dorsum of left wrist 11/03/2019  ? Constipation 01/21/2018  ? Irregular periods 01/21/2018  ? Generalized anxiety disorder 01/21/2018  ?  ?Allergies:  ?Allergies  ?Allergen Reactions  ? Blue Dyes (Parenteral) Anaphylaxis  ? Blueberry [Vaccinium Angustifolium] Anaphylaxis  ? Raspberry Anaphylaxis  ? Shellfish Allergy Shortness Of Breath  ?  ALL SHELLFISH  ? ?Medications:  ?Current Outpatient Medications:  ?  albuterol (VENTOLIN HFA) 108 (90 Base) MCG/ACT inhaler, Inhale 2 puffs into the lungs every 6 (six) hours as needed  for wheezing or shortness of breath., Disp: 8 g, Rfl: 0 ?  benzonatate (TESSALON PERLES) 100 MG capsule, Take 1 capsule (100 mg total) by mouth 3 (three) times daily as needed., Disp: 20 capsule, Rfl: 0 ?  dicyclomine (BENTYL) 20 MG tablet, Take 1 tablet (20 mg total) by mouth 2 (two) times daily., Disp: 30 tablet, Rfl: 0 ?  famotidine (PEPCID) 20 MG tablet, Take 1 tablet (20 mg total) by mouth 2 (two) times daily., Disp: 60 tablet, Rfl: 0 ?  FLUoxetine (PROZAC) 20 MG capsule, Take 1 capsule (20 mg total) by mouth daily., Disp: 30 capsule, Rfl: 2 ?  fluticasone (FLONASE) 50 MCG/ACT  nasal spray, Place 2 sprays into both nostrils daily., Disp: 16 g, Rfl: 6 ?  hydrOXYzine (ATARAX) 10 MG tablet, Take 1 tablet (10 mg total) by mouth 3 (three) times daily as needed., Disp: 75 tablet, Rfl: 1 ?  naproxen (NAPROSYN) 500 MG tablet, Take 1 tablet (500 mg total) by mouth 2 (two) times daily with a meal., Disp: 60 tablet, Rfl: 1 ?  Olopatadine HCl 0.2 % SOLN, Apply 1 drop to eye daily., Disp: 2.5 mL, Rfl: 0 ?  ondansetron (ZOFRAN-ODT) 4 MG disintegrating tablet, Take 1 tablet (4 mg total) by mouth every 8 (eight) hours as needed for nausea or vomiting., Disp: 20 tablet, Rfl: 0 ?  polyethylene glycol powder (GLYCOLAX/MIRALAX) 17 GM/SCOOP powder, Take 17 g by mouth in the morning and at bedtime., Disp: 850 g, Rfl: 3 ?  senna (SENOKOT) 8.6 MG TABS tablet, Take 1 tablet (8.6 mg total) by mouth daily., Disp: 120 tablet, Rfl: 0 ?  trimethoprim-polymyxin b (POLYTRIM) ophthalmic solution, Place 1 drop into the right eye every 4 (four) hours., Disp: 10 mL, Rfl: 0 ? ?Observations/Objective: ?Patient is well-developed, well-nourished in no acute distress.  ?Resting comfortably  at home.  ?Head is normocephalic, atraumatic.  ?No labored breathing.  ?Speech is clear and coherent with logical content.  ?Patient is alert and oriented at baseline.  ? ? ?Assessment and Plan: ?1. Menses painful ?Advised patient to take 655m every 6 hours with food for relief. Discussed importance of splitting up dose for ongoing relief.  ?Advised follow up with PCP this week to discuss management of heavy periods  ? ?   ? ?Follow Up Instructions: ?I discussed the assessment and treatment plan with the patient. The patient was provided an opportunity to ask questions and all were answered. The patient agreed with the plan and demonstrated an understanding of the instructions.  A copy of instructions were sent to the patient via MyChart unless otherwise noted below.  ? ? ?The patient was advised to call back or seek an in-person evaluation  if the symptoms worsen or if the condition fails to improve as anticipated. ? ?Time:  ?I spent 10 minutes with the patient via telehealth technology discussing the above problems/concerns.   ? ?SApolonio Schneiders FNP  ?

## 2021-11-06 ENCOUNTER — Ambulatory Visit (HOSPITAL_COMMUNITY)
Admission: EM | Admit: 2021-11-06 | Discharge: 2021-11-06 | Disposition: A | Discharge status: Home / Self Care | Payer: Medicaid Other | Attending: Family Medicine | Admitting: Family Medicine

## 2021-11-06 ENCOUNTER — Encounter (HOSPITAL_COMMUNITY): Payer: Self-pay

## 2021-11-06 ENCOUNTER — Other Ambulatory Visit: Payer: Self-pay

## 2021-11-06 ENCOUNTER — Encounter: Payer: Self-pay | Admitting: Radiology

## 2021-11-06 DIAGNOSIS — R102 Pelvic and perineal pain: Secondary | ICD-10-CM | POA: Diagnosis not present

## 2021-11-06 LAB — POCT URINALYSIS DIPSTICK, ED / UC
Bilirubin Urine: NEGATIVE
Glucose, UA: NEGATIVE mg/dL
Ketones, ur: NEGATIVE mg/dL
Leukocytes,Ua: NEGATIVE
Nitrite: NEGATIVE
Protein, ur: NEGATIVE mg/dL
Specific Gravity, Urine: 1.01 (ref 1.005–1.030)
Urobilinogen, UA: 0.2 mg/dL (ref 0.0–1.0)
pH: 6 (ref 5.0–8.0)

## 2021-11-06 LAB — POC URINE PREG, ED: Preg Test, Ur: NEGATIVE

## 2021-11-06 MED ORDER — KETOROLAC TROMETHAMINE 30 MG/ML IJ SOLN
30.0000 mg | Freq: Once | INTRAMUSCULAR | Status: AC
  Administered 2021-11-06: 30 mg via INTRAMUSCULAR

## 2021-11-06 MED ORDER — KETOROLAC TROMETHAMINE 30 MG/ML IJ SOLN
INTRAMUSCULAR | Status: AC
  Filled 2021-11-06: qty 1

## 2021-11-06 NOTE — ED Provider Notes (Signed)
?[facility] ? ? ? ?CSN: [id number] ?Arrival date & time: 11/06/21  1613 ? ? ?  ? ?History   ?Chief Complaint ?Chief Complaint  ?Patient presents with  ? Abdominal Pain  ? ? ?HPI ?Jacqueline Gonzalez is a 21 y.o. female.  ? ? ?Abdominal Pain ?Here for 1 week history of lower abdominal pain.  She has also had heavy menstrual bleeding in that time.  She was seen in the emergency room on April 15, and her hemoglobin was stable compared to February at 11.7. ? ?No fever or chills.  She has had some nausea but no vomiting.  Last bowel movement was today though it was small.  She has an appointment with her family practice tomorrow and with OB/GYN later this month ? ?Past Medical History:  ?Diagnosis Date  ? Constipation, chronic   ? Environmental allergies   ? 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 GERD (gastroesophageal reflux disease)   ? watches diet  ? Headache   ? IBS (irritable bowel syndrome)   ? Lactose intolerance   ? ? ?Patient Active Problem List  ? Diagnosis Date Noted  ? Moderate episode of recurrent major depressive disorder ([place]) 06/23/2021  ? Low back pain 05/02/2021  ? Headache 09/03/2020  ? Nonspecific abdominal pain 08/30/2020  ? Ganglion cyst of dorsum of left wrist 11/03/2019  ? Constipation 01/21/2018  ? Irregular periods 01/21/2018  ? Generalized anxiety disorder 01/21/2018  ? ? ?Past Surgical History:  ?Procedure Laterality Date  ? GANGLION CYST EXCISION Left 04/24/2021  ? Procedure: left recurrent dorsal carpal ganglion excision;  Surgeon: [name], MD;  Location: [facility];  Service: Orthopedics;  Laterality: Left;  ? TONSILLECTOMY AND ADENOIDECTOMY    ? AGE 24  ? [place] EXTRACTION  03/18/2021  ? WRIST GANGLION EXCISION Left 02/2020  ? ? ?OB History   ?No obstetric history on file. ?  ? ? ? ?Home Medications   ? ?Prior to Admission medications   ?Medication Sig Start Date End Date Taking? Authorizing Provider  ?albuterol (VENTOLIN HFA) 108 (90 Base)  MCG/ACT inhaler Inhale 2 puffs into the lungs every 6 (six) hours as needed for wheezing or shortness of breath. 08/05/21   Mar Daring, PA-C  ?dicyclomine (BENTYL) 20 MG tablet Take 1 tablet (20 mg total) by mouth 2 (two) times daily. 09/01/21   Elba Barman, DO  ?famotidine (PEPCID) 20 MG tablet Take 1 tablet (20 mg total) by mouth 2 (two) times daily. 08/27/21   Lurline Del, DO  ?FLUoxetine (PROZAC) 20 MG capsule Take 1 capsule (20 mg total) by mouth daily. 09/12/21 09/12/22  Malachy Mood, PA  ?fluticasone (FLONASE) 50 MCG/ACT nasal spray Place 2 sprays into both nostrils daily. 10/19/21   Sharion Balloon, FNP  ?hydrOXYzine (ATARAX) 10 MG tablet Take 1 tablet (10 mg total) by mouth 3 (three) times daily as needed. 08/01/21   Malachy Mood, PA  ?Olopatadine HCl 0.2 % SOLN Apply 1 drop to eye daily. 09/11/21   Mar Daring, PA-C  ?polyethylene glycol powder (GLYCOLAX/MIRALAX) 17 GM/SCOOP powder Take 17 g by mouth in the morning and at bedtime. 07/23/21   Zola Button, MD  ?senna (SENOKOT) 8.6 MG TABS tablet Take 1 tablet (8.6 mg total) by mouth daily. 07/23/21   Zola Button, MD  ? ? ?Family History ?Family History  ?Problem Relation Age of Onset  ? Asthma Father   ? Diabetes Maternal Grandmother   ?  Diabetes Paternal Grandmother   ? Kidney disease Paternal Grandfather   ? Graves' disease Paternal Aunt   ? ? ?Social History ?Social History  ? ?Tobacco Use  ? Smoking status: Never  ? Smokeless tobacco: Never  ?Vaping Use  ? Vaping Use: Never used  ?Substance Use Topics  ? Alcohol use: Never  ? Drug use: Never  ? ? ? ?Allergies   ?Blue dyes (parenteral), Blueberry [vaccinium angustifolium], Raspberry, and Shellfish allergy ? ? ?Review of Systems ?Review of Systems  ?Gastrointestinal:  Positive for abdominal pain.  ? ? ?Physical Exam ?Triage Vital Signs ?ED Triage Vitals  ?Enc Vitals Group  ?   BP 11/06/21 1623 (!) 151/95  ?   Pulse Rate 11/06/21 1623 (!) 107  ?   Resp 11/06/21 1623 18  ?   Temp 11/06/21  1623 98.2 ?F (36.8 ?C)  ?   Temp Source 11/06/21 1623 Oral  ?   SpO2 11/06/21 1623 99 %  ?   Weight --   ?   Height --   ?   Head Circumference --   ?   Peak Flow --   ?   Pain Score 11/06/21 1621 5  ?   Pain Loc --   ?   Pain Edu? --   ?   Excl. in [place]? --   ? ?No data found. ? ?Updated Vital Signs ?BP (!) 151/95 (BP Location: Left Arm)   Pulse (!) 107   Temp 98.2 ?F (36.8 ?C) (Oral)   Resp 18   LMP 10/29/2021   SpO2 99%  ? ?Visual Acuity ?Right Eye Distance:   ?Left Eye Distance:   ?Bilateral Distance:   ? ?Right Eye Near:   ?Left Eye Near:    ?Bilateral Near:    ? ?Physical Exam ?Vitals reviewed.  ?Constitutional:   ?   General: She is not in acute distress. ?   Appearance: She is not toxic-appearing.  ?HENT:  ?   Right Ear: Tympanic membrane and ear canal normal.  ?   Left Ear: Tympanic membrane and ear canal normal.  ?   Nose: Nose normal.  ?   Mouth/Throat:  ?   Mouth: Mucous membranes are moist.  ?   Pharynx: No oropharyngeal exudate or posterior oropharyngeal erythema.  ?Eyes:  ?   Extraocular Movements: Extraocular movements intact.  ?   Conjunctiva/sclera: Conjunctivae normal.  ?   Pupils: Pupils are equal, round, and reactive to light.  ?Cardiovascular:  ?   Rate and Rhythm: Normal rate and regular rhythm.  ?   Heart sounds: No murmur heard. ?Pulmonary:  ?   Effort: Pulmonary effort is normal. No respiratory distress.  ?   Breath sounds: No wheezing, rhonchi or rales.  ?Chest:  ?   Chest wall: No tenderness.  ?Musculoskeletal:  ?   Cervical back: Neck supple.  ?Lymphadenopathy:  ?   Cervical: No cervical adenopathy.  ?Skin: ?   Capillary Refill: Capillary refill takes less than 2 seconds.  ?   Coloration: Skin is not jaundiced or pale.  ?Neurological:  ?   General: No focal deficit present.  ?   Mental Status: She is alert and oriented to person, place, and time.  ?Psychiatric:     ?   Behavior: Behavior normal.  ? ? ? ?UC Treatments / Results  ?Labs ?(all labs ordered are listed, but only abnormal  results are displayed) ?Labs Reviewed  ?POCT URINALYSIS DIPSTICK, ED / UC - Abnormal; Notable for the  following components:  ?    Result Value  ? Hgb urine dipstick MODERATE (*)   ? All other components within normal limits  ?POC URINE PREG, ED  ? ? ?EKG ? ? ?Radiology ?No results found. ? ?Procedures ?Procedures (including critical care time) ? ?Medications Ordered in UC ?Medications  ?ketorolac (TORADOL) 30 MG/ML injection 30 mg (has no administration in time range)  ? ? ?Initial Impression / Assessment and Plan / UC Course  ?I have reviewed the triage vital signs and the nursing notes. ? ?Pertinent labs & imaging results that were available during my care of the patient were reviewed by me and considered in my medical decision making (see chart for details). ? ?  ? ?Urine pregnancy test is negative.  Urinalysis shows only a large amount of blood, expected with her being on her period. ? ?We discussed her possibly proceeding to the ER, but she defers due to possibly having to wait. Will treat with Toradol today, and swab done. ?Final Clinical Impressions(s) / UC Diagnoses  ? ?Final diagnoses:  ?Pelvic pain in female  ? ? ? ?Discharge Instructions   ? ?  ?Your pregnancy test was negative.  Urinalysis was clear except for the blood from your period ? ?You are given a shot of Toradol 30 mg here in the office ? ?Staff will call you if anything is positive on the swab. ? ?Please keep your appointment tomorrow with your family practice office ? ?If your abdominal pain worsens overnight, proceed to the emergency room ? ? ? ? ?ED Prescriptions   ?None ?  ? ?I have reviewed the PDMP during this encounter. ?  ?Barrett Henle, MD ?11/06/21 1657 ? ?

## 2021-11-06 NOTE — Discharge Instructions (Addendum)
Your pregnancy test was negative.  Urinalysis was clear except for the blood from your period ? ?You are given a shot of Toradol 30 mg here in the office ? ?Staff will call you if anything is positive on the swab. ? ?Please keep your appointment tomorrow with your family practice office ? ?If your abdominal pain worsens overnight, proceed to the emergency room ?

## 2021-11-06 NOTE — ED Triage Notes (Signed)
Pt presents today with lower abdomen pain that is sharp and radiates though her side and back.. Pt has taken Advil and Ibuprofen, with no relief. Pt states her cycle is on and off for the past week.  ?

## 2021-11-07 ENCOUNTER — Telehealth (INDEPENDENT_AMBULATORY_CARE_PROVIDER_SITE_OTHER): Payer: Medicaid Other | Admitting: Physician Assistant

## 2021-11-07 ENCOUNTER — Encounter: Payer: Self-pay | Admitting: Family Medicine

## 2021-11-07 ENCOUNTER — Ambulatory Visit (INDEPENDENT_AMBULATORY_CARE_PROVIDER_SITE_OTHER): Payer: Medicaid Other | Admitting: Family Medicine

## 2021-11-07 VITALS — BP 126/74 | HR 96 | Ht 69.0 in | Wt 236.2 lb

## 2021-11-07 DIAGNOSIS — R1030 Lower abdominal pain, unspecified: Secondary | ICD-10-CM | POA: Diagnosis not present

## 2021-11-07 DIAGNOSIS — F331 Major depressive disorder, recurrent, moderate: Secondary | ICD-10-CM | POA: Diagnosis not present

## 2021-11-07 DIAGNOSIS — F411 Generalized anxiety disorder: Secondary | ICD-10-CM

## 2021-11-07 DIAGNOSIS — N926 Irregular menstruation, unspecified: Secondary | ICD-10-CM

## 2021-11-07 DIAGNOSIS — K5900 Constipation, unspecified: Secondary | ICD-10-CM | POA: Diagnosis present

## 2021-11-07 DIAGNOSIS — N922 Excessive menstruation at puberty: Secondary | ICD-10-CM

## 2021-11-07 MED ORDER — FLUOXETINE HCL 20 MG PO CAPS: 20.0000 mg | ORAL_CAPSULE | Freq: Every day | ORAL | 2 refills | Status: DC

## 2021-11-07 NOTE — Assessment & Plan Note (Addendum)
Patient reports abnormally heavy menses starting on 10/31/2021 and continuing through today's visit. She is not currently on birth control. We discussed possibility of starting birth control to manage flow rate and prevent unwanted pregnancy. Patient is willing to consider this, and will follow up in 1-2 weeks to further discuss starting birth control CBC ordered to assess Hgb given continued menorrhagia. Significant drop in Hgb from 11.7 to 8.6 likely secondary to current menses however would also like to rule out other etiology for anemia, called patient to inform her to come in next week for further blood work and potentially starting contraception to help regulate menorrhagia. ED precautions provided.  ?

## 2021-11-07 NOTE — Progress Notes (Addendum)
BH MD/PA/NP OP Progress Note ? ?Virtual Visit via Telephone Note ? ?I connected with Jacqueline Gonzalez on 11/07/21 at  1:00 PM EDT by telephone and verified that I am speaking with the correct person using two identifiers. ? ?Location: ?Patient: Home ?Provider: Clinic ?  ?I discussed the limitations, risks, security and privacy concerns of performing an evaluation and management service by telephone and the availability of in person appointments. I also discussed with the patient that there may be a patient responsible charge related to this service. The patient expressed understanding and agreed to proceed. ? ?Follow Up Instructions: ?  ?I discussed the assessment and treatment plan with the patient. The patient was provided an opportunity to ask questions and all were answered. The patient agreed with the plan and demonstrated an understanding of the instructions. ?  ?The patient was advised to call back or seek an in-person evaluation if the symptoms worsen or if the condition fails to improve as anticipated. ? ?I provided 17 minutes of non-face-to-face time during this encounter. ? ?Malachy Mood, PA  ? ? ?11/07/2021 1:10 PM ?Jacqueline Gonzalez  ?MRN:  [id number] ? ?Chief Complaint: No chief complaint on file. ? ?HPI:  ? ?Jacqueline Gonzalez is a 21 year old female with a past psychiatric history significant for generalized anxiety disorder and major depressive disorder who presents to [facility] via virtual telephone visit for follow-up and medication management.  Patient is currently being managed on the following medications: Fluoxetine (Prozac) 20 mg daily. ? ?Patient reports that she has not been taking her fluoxetine consistently due to a lot going on in her life, the most prominent factor being her health related issues.  Patient was informed that in order to get the most benefit from the medication, it is imperative that she takes her medication consistently.  Patient  vocalized understanding.  In regards to her health issues, patient states that she has been having stomach issues and has been in a lot of pain.  Patient endorses stomach pains, flank pain, and headaches.  In addition to her health issues, patient states that she has been moody, angry, and depressed. ? ?Patient endorses the following depressive symptoms: lack of motivation, decreased energy, irritability, and difficulty getting out of bed.  Patient also endorses having elevated anxiety when going outside of the house.  Whenever she is outside of the house, patient feels that people are staring at her and knows that something is wrong with her health wise.  A PHQ-9 screen was performed with the patient scoring a 14.  A GAD-7 screen was also performed with the patient scoring a 17. ? ?Patient is alert and oriented x4, calm, cooperative, and fully engaged in conversation during the encounter.  Patient reports feeling on edge but is doing the best she can.  Patient denies suicidal or homicidal ideations.  She further denies auditory or visual hallucinations and does not appear to be responding to internal/external stimuli.  Patient endorses fair sleep and receives on average 6 hours of sleep each night.  Patient endorses decreased appetite and eats on average 1 meal per day.  Patient denies alcohol consumption, tobacco use, and illicit drug use. ? ?Visit Diagnosis:  ?  ICD-10-CM   ?1. Generalized anxiety disorder  F41.1 FLUoxetine (PROZAC) 20 MG capsule  ?  ?2. Moderate episode of recurrent major depressive disorder (HCC)  F33.1 FLUoxetine (PROZAC) 20 MG capsule  ?  ? ? ?Past Psychiatric History:  ?Major depressive disorder ?Generalized anxiety disorder ? ?  Past Medical History:  ?Past Medical History:  ?Diagnosis Date  ? Constipation, chronic   ? Environmental allergies   ? 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 GERD (gastroesophageal reflux disease)   ? watches diet  ? Headache   ? IBS (irritable bowel  syndrome)   ? Lactose intolerance   ?  ?Past Surgical History:  ?Procedure Laterality Date  ? GANGLION CYST EXCISION Left 04/24/2021  ? Procedure: left recurrent dorsal carpal ganglion excision;  Surgeon: [name], MD;  Location: [facility];  Service: Orthopedics;  Laterality: Left;  ? TONSILLECTOMY AND ADENOIDECTOMY    ? AGE 40  ? [place] EXTRACTION  03/18/2021  ? WRIST GANGLION EXCISION Left 02/2020  ? ? ?Family Psychiatric History:  ?Father - patient denies that her father has been officially diagnosed with any mental health diagnoses but states that he suffers from mental health. ?  ?Patient also believes that her Aunt (maternal) also suffers from mental health. ? ?Family History:  ?Family History  ?Problem Relation Age of Onset  ? Asthma Father   ? Diabetes Maternal Grandmother   ? Diabetes Paternal Grandmother   ? Kidney disease Paternal Grandfather   ? Graves' disease Paternal Aunt   ? ? ?Social History:  ?Social History  ? ?Socioeconomic History  ? Marital status: Single  ?  Spouse name: Not on file  ? Number of children: Not on file  ? Years of education: Not on file  ? Highest education level: Not on file  ?Occupational History  ? Not on file  ?Tobacco Use  ? Smoking status: Never  ? Smokeless tobacco: Never  ?Vaping Use  ? Vaping Use: Never used  ?Substance and Sexual Activity  ? Alcohol use: Never  ? Drug use: Never  ? Sexual activity: Not on file  ?Other Topics Concern  ? Not on file  ?Social History Narrative  ? Not on file  ? ?Social Determinants of Health  ? ?Financial Resource Strain: Not on file  ?Food Insecurity: Not on file  ?Transportation Needs: Not on file  ?Physical Activity: Not on file  ?Stress: No Stress Concern Present  ? Feeling of Stress : Only a little  ?Social Connections: Not on file  ? ? ?Allergies:  ?Allergies  ?Allergen Reactions  ? Blue Dyes (Parenteral) Anaphylaxis  ? Blueberry [Vaccinium Angustifolium] Anaphylaxis  ? Raspberry Anaphylaxis  ? Shellfish  Allergy Shortness Of Breath  ?  ALL SHELLFISH  ? ? ?Metabolic Disorder Labs: ?No results found for: HGBA1C, MPG ?No results found for: PROLACTIN ?No results found for: CHOL, TRIG, HDL, CHOLHDL, VLDL, LDLCALC ?No results found for: TSH ? ?Therapeutic Level Labs: ?No results found for: LITHIUM ?No results found for: VALPROATE ?No components found for:  CBMZ ? ?Current Medications: ?Current Outpatient Medications  ?Medication Sig Dispense Refill  ? albuterol (VENTOLIN HFA) 108 (90 Base) MCG/ACT inhaler Inhale 2 puffs into the lungs every 6 (six) hours as needed for wheezing or shortness of breath. (Patient not taking: Reported on 11/07/2021) 8 g 0  ? dicyclomine (BENTYL) 20 MG tablet Take 1 tablet (20 mg total) by mouth 2 (two) times daily. (Patient not taking: Reported on 11/07/2021) 30 tablet 0  ? famotidine (PEPCID) 20 MG tablet Take 1 tablet (20 mg total) by mouth 2 (two) times daily. (Patient not taking: Reported on 11/07/2021) 60 tablet 0  ? FLUoxetine (PROZAC) 20 MG capsule Take 1 capsule (20 mg total) by mouth daily. [place]  capsule 2  ? fluticasone (FLONASE) 50 MCG/ACT nasal spray Place 2 sprays into both nostrils daily. 16 g 6  ? hydrOXYzine (ATARAX) 10 MG tablet Take 1 tablet (10 mg total) by mouth 3 (three) times daily as needed. (Patient not taking: Reported on 11/07/2021) 75 tablet 1  ? Olopatadine HCl 0.2 % SOLN Apply 1 drop to eye daily. (Patient not taking: Reported on 11/07/2021) 2.5 mL 0  ? polyethylene glycol powder (GLYCOLAX/MIRALAX) 17 GM/SCOOP powder Take 17 g by mouth in the morning and at bedtime. 850 g 3  ? senna (SENOKOT) 8.6 MG TABS tablet Take 1 tablet (8.6 mg total) by mouth daily. (Patient not taking: Reported on 11/07/2021) 120 tablet 0  ? ?No current facility-administered medications for this visit.  ? ? ? ?Musculoskeletal: ?Strength & Muscle Tone: Unable to assess due to telemedicine visit ?Gait & Station: Unable to assess due to telemedicine visit ?Patient leans: Unable to assess due to  telemedicine visit ? ?Psychiatric Specialty Exam: ?Review of Systems  ?Psychiatric/Behavioral:  Positive for sleep disturbance. Negative for decreased concentration, dysphoric mood, hallucinations, self-injury a

## 2021-11-07 NOTE — Progress Notes (Signed)
? ? ?SUBJECTIVE:  ? ?CHIEF COMPLAINT / HPI:  ? ?Lower Abdominal Pain: ?Patient has been experiencing significant lower abdominal pain since Friday 10/31/2021. She has a history of cramping abdominal pain from IBS and constipation, but reports this newer pain felt different, like cramping with occasional sharp pains. Pain presented alongside heavy menses. Pain is worst in LLQ and is tender to palpation. She reports radiation to her back and the top of her left thigh. She additionally reports fatigue, dizziness, and headache. She denies fever. She denies burning with urination; she does report stomach cramping with urination, making it difficult to void. She has used heating pads, Midol, and ibuprofen for the pain without much relief. She was seen in the hospital and urgent care multiple times since symptoms began, including most recently on 11/06/2021. At this time, she had negative urinalysis, urine pregnancy test. She was given Toradol 30 mg, which she reports did not help her pain. Swab was taken; results are not yet back. Patient has a longstanding history of constipation and has been previously prescribed Miralax, though she is currently taking this irregularly. She is currently experiencing lower abdominal pain (see above). She cannot recall her last BM; she denies daily BMs. She reports hard, small stool when she does experience BMs.  ? ?Irregular/Heavy Menses: ?Patient has a history of irregular menses. She reports significantly heavier menses beginning on 10/31/2021 alongside her abdominal pain; she has also noticed blood clots present with her bleeding. She is still experiencing heavy menses at today's OV and reports using multiple pads a day and even sometimes diapers to manage bleeding. She says her cycle has been ongoing for a week this time. She denies prior heavy menstruation to this degree. She is not on birth control. She did take Plan B on 11/01/2021. Otherwise she remains asymptomatic from  bleeding.  ? ? ?PERTINENT  PMH / PSH: History of constipation, irregular menstruation ? ?OBJECTIVE:  ? ?BP 126/74   Pulse 96   Ht 5' 9"  (1.753 m)   Wt 236 lb 3.2 oz (107.1 kg)   LMP 10/30/2021   SpO2 100%   BMI 34.88 kg/m?   ? ?General: Patient well-appearing, in no acute distress. ?Heart: Regular rate and rhythm. Normal S1, S2. ?Lungs: Clear to auscultation. No wheezing, rales or rhronchi noted. No respiratory distress. ?Abdomen: soft, tenderness along lower quadrants bilaterally upon deep palpation, nondistended, presence of normal bowel sounds, negative psoas sign  ?MSK: no CVA tenderness  ?Mood: Normal affect and mood. ? ?ASSESSMENT/PLAN:  ? ?Irregular periods, Anemia secondary to uterine bleeding  ?Patient reports abnormally heavy menses starting on 10/31/2021 and continuing through today's visit. She is not currently on birth control. We discussed possibility of starting birth control to manage flow rate and prevent unwanted pregnancy. Patient is willing to consider this, and will follow up in 1-2 weeks to further discuss starting birth control CBC ordered to assess Hgb given continued menorrhagia. Significant drop in Hgb from 11.7 to 8.6 likely secondary to current menses however would also like to rule out other etiology for anemia, called patient to inform her to come in next week for further blood work and potentially starting contraception to help regulate menorrhagia. ED precautions provided.  ? ?Lower abdominal pain ?Patient reports significant left lower abdominal pain starting 10/31/2021 and continuing through today's visit. She has been seen multiple times in the hospital prior to today and has had negative urinalysis and pregnancy tests; swab is still pending. Patient was informed that her symptoms  are likely due to her heavy menses and history of constipation; will address both. Will consider further workup pending results of swab to rule out STI. Low concern for other etiologies including  appendicitis, pyelonephritis, recent UA negative for UTI. Cramping from menstruation also likely contributing to pain. Patient has a longstanding history of constipation, previously managed with Miralax, though she is currently taking this irregularly. She reports significant left lower abdominal pain since 10/31/2021 and has been having irregular BMs. Patient was encouraged to restart regular Miralax and was instructed to take one cap a day to start, with dose adjustments as needed. Likely contributing to abdominal pain. Instructed to follow up in 1-2 weeks.  ?  ?-PHQ-9 score of 14 with negative question 9 reviewed.  ?-Patient's contact information verified, she prefers to be contacted at [phone] ? ?Donney Dice, DO ?[place]   ? ?I was personally present and performed or re-performed the history, physical exam and medical decision making activities of this service and have verified that the service and findings are accurately documented in the student?s note. My edits are noted within the note, please also see attending's attestation.  ? ?Donney Dice, DO                   ?PGY-2 ?[place]  ?

## 2021-11-07 NOTE — Assessment & Plan Note (Addendum)
Patient reports significant left lower abdominal pain starting 10/31/2021 and continuing through today's visit. She has been seen multiple times in the hospital prior to today and has had negative urinalysis and pregnancy tests; swab is still pending. Patient was informed that her symptoms are likely due to her heavy menses and history of constipation; will address both. Will consider further workup pending results of swab to rule out STI. Low concern for other etiologies including appendicitis, pyelonephritis, recent UA negative for UTI. Cramping from menstruation also likely contributing to pain. Patient has a longstanding history of constipation, previously managed with Miralax, though she is currently taking this irregularly. She reports significant left lower abdominal pain since 10/31/2021 and has been having irregular BMs. Patient was encouraged to restart regular Miralax and was instructed to take one cap a day to start, with dose adjustments as needed. Likely contributing to abdominal pain. Instructed to follow up in 1-2 weeks.  ?

## 2021-11-07 NOTE — Assessment & Plan Note (Addendum)
Patient has a longstanding history of constipation, previously managed with Miralax, though she is currently taking this irregularly. She reports significant left lower abdominal pain since 10/31/2021 and has been having irregular BMs. Patient was encouraged to restart regular Miralax and was instructed to take one cap a day to start, with dose adjustments as needed. Likely contributing to abdominal pain. Instructed to follow up in 1-2 weeks.  ?

## 2021-11-07 NOTE — Patient Instructions (Signed)
It was great seeing you today! ? ?Today we discussed your abdominal pain, it seems it is from mensuration with the cramping as well as constipation. Please take a capful of miralax daily and you may adjust as we discussed. The goal is to have at least 1 soft, formed stool a day.  ? ?We will also check your hemoglobin to make sure you do not have anemia.  ? ?Please follow up at your next scheduled appointment in 1-2 weeks, if anything arises between now and then, please don't hesitate to contact our office. ? ? ?Thank you for allowing [place] to be a part of your medical care! ? ?Thank you, ?Dr. Larae Grooms  ?

## 2021-11-08 LAB — CBC
Hematocrit: 25.9 % — ABNORMAL LOW (ref 34.0–46.6)
Hemoglobin: 8.6 g/dL — ABNORMAL LOW (ref 11.1–15.9)
MCH: 28 pg (ref 26.6–33.0)
MCHC: 33.2 g/dL (ref 31.5–35.7)
MCV: 84 fL (ref 79–97)
Platelets: 247 10*3/uL (ref 150–450)
RBC: 3.07 x10E6/uL — ABNORMAL LOW (ref 3.77–5.28)
RDW: 13 % (ref 11.7–15.4)
WBC: 6.9 10*3/uL (ref 3.4–10.8)

## 2021-11-09 ENCOUNTER — Encounter (HOSPITAL_COMMUNITY): Payer: Self-pay | Admitting: Physician Assistant

## 2021-11-10 ENCOUNTER — Other Ambulatory Visit: Payer: Self-pay | Admitting: Family Medicine

## 2021-11-10 DIAGNOSIS — D649 Anemia, unspecified: Secondary | ICD-10-CM

## 2021-11-10 MED ORDER — FERROUS SULFATE 325 (65 FE) MG PO TABS: 325.0000 mg | ORAL_TABLET | ORAL | 0 refills | Status: DC

## 2021-11-10 NOTE — Progress Notes (Signed)
Spoke with patient made appt for May 1st at 1:30. Salvatore Marvel, CMA ? ?

## 2021-11-13 ENCOUNTER — Other Ambulatory Visit: Payer: Self-pay | Admitting: Obstetrics & Gynecology

## 2021-11-13 ENCOUNTER — Encounter: Payer: Self-pay | Admitting: Obstetrics & Gynecology

## 2021-11-13 ENCOUNTER — Ambulatory Visit (INDEPENDENT_AMBULATORY_CARE_PROVIDER_SITE_OTHER): Payer: Medicaid Other | Admitting: Obstetrics & Gynecology

## 2021-11-13 ENCOUNTER — Other Ambulatory Visit (HOSPITAL_COMMUNITY)
Admission: RE | Admit: 2021-11-13 | Discharge: 2021-11-13 | Disposition: A | Discharge status: Home / Self Care | Payer: Medicaid Other | Source: Ambulatory Visit | Attending: Obstetrics & Gynecology | Admitting: Obstetrics & Gynecology

## 2021-11-13 VITALS — BP 116/79 | HR 85 | Ht 69.0 in | Wt 236.0 lb

## 2021-11-13 DIAGNOSIS — N946 Dysmenorrhea, unspecified: Secondary | ICD-10-CM | POA: Diagnosis not present

## 2021-11-13 DIAGNOSIS — N921 Excessive and frequent menstruation with irregular cycle: Secondary | ICD-10-CM | POA: Diagnosis present

## 2021-11-13 DIAGNOSIS — N86 Erosion and ectropion of cervix uteri: Secondary | ICD-10-CM | POA: Diagnosis not present

## 2021-11-13 DIAGNOSIS — N760 Acute vaginitis: Secondary | ICD-10-CM

## 2021-11-13 DIAGNOSIS — A749 Chlamydial infection, unspecified: Secondary | ICD-10-CM

## 2021-11-13 DIAGNOSIS — B9689 Other specified bacterial agents as the cause of diseases classified elsewhere: Secondary | ICD-10-CM

## 2021-11-13 DIAGNOSIS — R7989 Other specified abnormal findings of blood chemistry: Secondary | ICD-10-CM

## 2021-11-13 HISTORY — DX: Chlamydial infection, unspecified: A74.9

## 2021-11-13 MED ORDER — NAPROXEN SODIUM 550 MG PO TABS: 550.0000 mg | ORAL_TABLET | Freq: Two times a day (BID) | ORAL | 2 refills | Status: DC

## 2021-11-13 MED ORDER — NORETHINDRONE-ETH ESTRADIOL 1-35 MG-MCG PO TABS: 1.0000 | ORAL_TABLET | Freq: Every day | ORAL | 11 refills | Status: DC

## 2021-11-13 NOTE — Progress Notes (Signed)
? ?GYNECOLOGY OFFICE VISIT NOTE ? ?History:  ? Jacqueline Gonzalez is a 21 y.o. G0 here today for evaluation and management of episode of heavy and prolonged menstrual bleeding that occurred earlier this month after taking Plan B.  Was seen in the ED for this on 11/01/2021.  Had regular menstrual periods until 10/31/2021 when she had heavy and painful menstrual bleeding.  Hgb was 11.7, HCG negative.  She was reassured and told to follow up with PCP or GYN.  She reported continued heavy bleeding afterwards, and her hemoglobin dropped to 8.6 on 11/07/21.  The bleeding finally slowed down two days ago, she is currently spotting.  She wants to make sure everything is okay as she feels this is an exaggerated response to Plan B. She is sexually active with female partner only at this time, but has been with female partners too.  Reports having spotting with any penetrative intercourse, wonders what is causing this.   She denies any current abnormal vaginal discharge, pelvic pain or other concerns.  ?  ?Past Medical History:  ?Diagnosis Date  ? Constipation, chronic   ? Environmental allergies   ? 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 GERD (gastroesophageal reflux disease)   ? watches diet  ? Headache   ? IBS (irritable bowel syndrome)   ? Lactose intolerance   ? ? ?Past Surgical History:  ?Procedure Laterality Date  ? GANGLION CYST EXCISION Left 04/24/2021  ? Procedure: left recurrent dorsal carpal ganglion excision;  Surgeon: [name], MD;  Location: [facility];  Service: Orthopedics;  Laterality: Left;  ? TONSILLECTOMY AND ADENOIDECTOMY    ? AGE 63  ? [place] EXTRACTION  03/18/2021  ? WRIST GANGLION EXCISION Left 02/2020  ? ? ?The following portions of the patient's history were reviewed and updated as appropriate: allergies, current medications, past family history, past medical history, past social history, past surgical history and problem list.  ? ?Health Maintenance:  Has received  HPV vaccine series.  ? ?Review of Systems:  ?Pertinent items noted in HPI and remainder of comprehensive ROS otherwise negative. ? ?Physical Exam:  ?BP 116/79   Pulse 85   Ht 5' 9"  (1.753 m)   Wt 236 lb (107 kg)   LMP 10/30/2021 (Exact Date)   BMI 34.85 kg/m?  ?CONSTITUTIONAL: Well-developed, well-nourished female in no acute distress.  ?HEENT:  Normocephalic, atraumatic. External right and left ear normal. No scleral icterus.  ?NECK: Normal range of motion, supple, no masses noted on observation ?SKIN: No rash noted. Not diaphoretic. No erythema. No pallor. ?MUSCULOSKELETAL: Normal range of motion. No edema noted. ?NEUROLOGIC: Alert and oriented to person, place, and time. Normal muscle tone coordination. No cranial nerve deficit noted. ?PSYCHIATRIC: Normal mood and affect. Normal behavior. Normal judgment and thought content. ?CARDIOVASCULAR: Normal heart rate noted ?RESPIRATORY: Effort and breath sounds normal, no problems with respiration noted ?ABDOMEN: No masses noted. No other overt distention noted.   ?PELVIC: Normal appearing external genitalia; normal urethral meatus; normal appearing vaginal mucosa and cervix with moderate extropion.  No abnormal discharge noted.  Normal uterine size, no other palpable masses, no uterine or adnexal tenderness. Performed in the presence of a chaperone ? ?Labs and Imaging ?Results for orders placed or performed in visit on 11/07/21 (from the past 168 hour(s))  ?CBC  ? Collection Time: 11/07/21  4:17 PM  ?Result Value Ref Range  ? WBC 6.9 3.4 - 10.8 x10E3/uL  ? RBC 3.07 (L)  3.77 - 5.28 x10E6/uL  ? Hemoglobin 8.6 (L) 11.1 - 15.9 g/dL  ? Hematocrit 25.9 (L) 34.0 - 46.6 %  ? MCV 84 79 - 97 fL  ? MCH 28.0 26.6 - 33.0 pg  ? MCHC 33.2 31.5 - 35.7 g/dL  ? RDW 13.0 11.7 - 15.4 %  ? Platelets 247 150 - 450 x10E3/uL  ?Results for orders placed or performed during the hospital encounter of 11/06/21 (from the past 168 hour(s))  ?POC Urinalysis dipstick  ? Collection Time: 11/06/21   4:41 PM  ?Result Value Ref Range  ? Glucose, UA NEGATIVE NEGATIVE mg/dL  ? Bilirubin Urine NEGATIVE NEGATIVE  ? Ketones, ur NEGATIVE NEGATIVE mg/dL  ? Specific Gravity, Urine 1.010 1.005 - 1.030  ? Hgb urine dipstick MODERATE (A) NEGATIVE  ? pH 6.0 5.0 - 8.0  ? Protein, ur NEGATIVE NEGATIVE mg/dL  ? Urobilinogen, UA 0.2 0.0 - 1.0 mg/dL  ? Nitrite NEGATIVE NEGATIVE  ? Leukocytes,Ua NEGATIVE NEGATIVE  ?POC urine pregnancy  ? Collection Time: 11/06/21  4:41 PM  ?Result Value Ref Range  ? Preg Test, Ur NEGATIVE NEGATIVE  ? ?No results found.    ?Assessment and Plan:  ?   ?1. Cervical ectropion ?Patient reassured about this benign finding and reason for bleeding with penetrative intercourse.  If this worsens, may need cryotherapy for treatment. Will continue to monitor. ? ?2. Dysmenorrhea ?Recommended NSAIDs as needed.  Given her concern about her pain, also recommended OCPs, to treat her "regular period pain".  Will obtain pelvic ultrasound to evaluate for structural anomalies. If this pain continues, may need further management or diagnostic laparoscopy. ?- US PELVIC COMPLETE WITH TRANSVAGINAL; Future ?- norethindrone-ethinyl estradiol 1/35 (ORTHO-NOVUM) tablet; Take 1 tablet by mouth daily.  Dispense: 28 tablet; Refill: 11 ? ?3. Menorrhagia with irregular cycle ?Will check labs and follow up pelvic ultrasound.  This will also be treated with the OCPs. Will follow up results and manage accordingly. Bleeding precautions advised.  Will continue iron therapy for anemia as prescribed by PCP. ?- Von Willebrand panel ?- TSH+Prl+TestT+TestF+17OHP ?- Beta hCG quant (ref lab) ?- Cervicovaginal ancillary only( [place])  ?- US PELVIC COMPLETE WITH TRANSVAGINAL; Future ?- norethindrone-ethinyl estradiol 1/35 (ORTHO-NOVUM) tablet; Take 1 tablet by mouth daily.  Dispense: 28 tablet; Refill: 11 ? ?Routine preventative health maintenance measures emphasized. ?Please refer to After Visit Summary for other counseling  recommendations.  ? ?Return in about 1 month (around 12/13/2021) for Followup AUB and pain, OCP/BP check.   ? ?I spent 30 minutes dedicated to the care of this patient including pre-visit review of records, face to face time with the patient discussing her conditions and treatments and post visit orders. ? ? ? ?Verita Schneiders, MD, FACOG ?Obstetrician Social research officer, government, Faculty Practice ?[facility] [place] ?

## 2021-11-13 NOTE — Patient Instructions (Signed)
You have a cervical ectropion - benign condition can cause bleeding with penetrative sex ? ? ?

## 2021-11-17 ENCOUNTER — Encounter: Payer: Self-pay | Admitting: Family Medicine

## 2021-11-17 ENCOUNTER — Ambulatory Visit (INDEPENDENT_AMBULATORY_CARE_PROVIDER_SITE_OTHER): Payer: Medicaid Other | Admitting: Family Medicine

## 2021-11-17 VITALS — BP 114/74 | HR 94 | Wt 235.0 lb

## 2021-11-17 DIAGNOSIS — R5383 Other fatigue: Secondary | ICD-10-CM | POA: Diagnosis not present

## 2021-11-17 DIAGNOSIS — D649 Anemia, unspecified: Secondary | ICD-10-CM | POA: Insufficient documentation

## 2021-11-17 DIAGNOSIS — Z114 Encounter for screening for human immunodeficiency virus [HIV]: Secondary | ICD-10-CM

## 2021-11-17 DIAGNOSIS — Z1159 Encounter for screening for other viral diseases: Secondary | ICD-10-CM

## 2021-11-17 NOTE — Progress Notes (Signed)
    SUBJECTIVE:   CHIEF COMPLAINT / HPI:   Patient presents for follow up, noted to have excessive flow with her most recent menstrual cycle. LMP 4/13 but has not been menstruating since 4/26. She usually has heavy flow but this was much heavier than she had ever experienced to the point where she was having to use diapers. Used about 2-3 pads within 2-3 hours each day before switching to diapers 1-2 a day. She has not had any uterine bleeding since 4/26. Recently followed up with gynecologist who started her on OCPs and ordered a pelvic ultrasound. Instructed by me to take iron supplementation every other day which she has been doing. Endorses generalized fatigue and tired all the time. Denies any other source of bleeding at this time. Currently not sexually active. Also encouraged by her professors to medically withdraw as she has missed a lot of school due to her pain and bleeding, she is requesting a letter from PCP to approve of this.   OBJECTIVE:   BP 114/74   Pulse 94   Wt 235 lb (106.6 kg)   LMP 10/30/2021 (Exact Date)   SpO2 97%   BMI 34.70 kg/m   General: Patient well-appearing, in no acute distress. HEENT: non-tender thyroid, PERRLA, normal mucous membranes CV: RRR, no murmurs or gallops auscultated Resp: CTAB, no wheezing, rales or rhonchi noted GI: soft, nontender, nondistended, presence of bowel sounds Ext: capillary refill less than 2 sec, no LE edema noted, radial pulses present bilaterally  ASSESSMENT/PLAN:   Anemia -likely secondary to menorrhagia, will obtained repeat CBC to monitor Hgb trend. Will also obtain ferritin and iron studies to evaluate for possible iron deficiency as a contributing factor. Will also obtain TSH given fatigue as it is likely due to anemia.  -reassuringly off cycle and no further bleeding source -would have started on OCPs which patient was already started on by gynecology, pelvic US later this week, continue to maintain routine follow up with  gynecology -follow up in 1 month or sooner as appropriate    -HIV and Hep C screening obtained today with the patient's permission.  -Discussed with patient to send forms that she may need so we can further discuss, she agrees and will send these forms at her earliest convenience.  -Med rec reviewed and updated appropriately -PHQ-9 score of 15 with negative question 9 reviewed.   Donney Dice, [place]

## 2021-11-17 NOTE — Assessment & Plan Note (Signed)
-  likely secondary to menorrhagia, will obtained repeat CBC to monitor Hgb trend. Will also obtain ferritin and iron studies to evaluate for possible iron deficiency as a contributing factor. Will also obtain TSH given fatigue as it is likely due to anemia.  ?-reassuringly off cycle and no further bleeding source ?-would have started on OCPs which patient was already started on by gynecology, pelvic US later this week, continue to maintain routine follow up with gynecology ?-follow up in 1 month or sooner as appropriate  ?

## 2021-11-17 NOTE — Patient Instructions (Signed)
It was great seeing you today! ? ?Today we discussed your menstrual bleeding, I anticipate your anemia is from this uterine bleeding. We will get blood work today and I will let you know of any abnormal results. Please continue to take the iron supplement once every other day. I suspect that this is what is causing your fatigue feeling. Please make sure to follow up with the gynecologist at your next visit.  ? ?Please follow up at your next scheduled appointment in 1 month, if anything arises between now and then, please don't hesitate to contact our office. ? ? ?Thank you for allowing [place] to be a part of your medical care! ? ?Thank you, ?Dr. Larae Grooms  ?

## 2021-11-18 ENCOUNTER — Encounter: Payer: Self-pay | Admitting: Obstetrics & Gynecology

## 2021-11-18 DIAGNOSIS — N921 Excessive and frequent menstruation with irregular cycle: Secondary | ICD-10-CM | POA: Insufficient documentation

## 2021-11-18 DIAGNOSIS — A749 Chlamydial infection, unspecified: Secondary | ICD-10-CM | POA: Insufficient documentation

## 2021-11-18 DIAGNOSIS — N946 Dysmenorrhea, unspecified: Secondary | ICD-10-CM | POA: Insufficient documentation

## 2021-11-18 LAB — CERVICOVAGINAL ANCILLARY ONLY
Bacterial Vaginitis (gardnerella): POSITIVE — AB
Candida Glabrata: NEGATIVE
Candida Vaginitis: NEGATIVE
Chlamydia: POSITIVE — AB
Comment: NEGATIVE
Comment: NEGATIVE
Comment: NEGATIVE
Comment: NEGATIVE
Comment: NEGATIVE
Comment: NORMAL
Neisseria Gonorrhea: NEGATIVE
Trichomonas: NEGATIVE

## 2021-11-18 LAB — HIV ANTIBODY (ROUTINE TESTING W REFLEX): HIV Screen 4th Generation wRfx: NONREACTIVE

## 2021-11-18 LAB — HCV AB W REFLEX TO QUANT PCR: HCV Ab: NONREACTIVE

## 2021-11-18 LAB — CBC
Hematocrit: 28.7 % — ABNORMAL LOW (ref 34.0–46.6)
Hemoglobin: 9.7 g/dL — ABNORMAL LOW (ref 11.1–15.9)
MCH: 28.5 pg (ref 26.6–33.0)
MCHC: 33.8 g/dL (ref 31.5–35.7)
MCV: 84 fL (ref 79–97)
Platelets: 307 10*3/uL (ref 150–450)
RBC: 3.4 x10E6/uL — ABNORMAL LOW (ref 3.77–5.28)
RDW: 12.8 % (ref 11.7–15.4)
WBC: 7.2 10*3/uL (ref 3.4–10.8)

## 2021-11-18 LAB — IRON AND TIBC
Iron Saturation: 67 % — ABNORMAL HIGH (ref 15–55)
Iron: 261 ug/dL (ref 27–159)
Total Iron Binding Capacity: 391 ug/dL (ref 250–450)
UIBC: 130 ug/dL — ABNORMAL LOW (ref 131–425)

## 2021-11-18 LAB — HCV INTERPRETATION

## 2021-11-18 LAB — TSH: TSH: 6.37 u[IU]/mL — ABNORMAL HIGH (ref 0.450–4.500)

## 2021-11-18 LAB — FERRITIN: Ferritin: 39 ng/mL (ref 15–150)

## 2021-11-18 MED ORDER — METRONIDAZOLE 500 MG PO TABS
500.0000 mg | ORAL_TABLET | Freq: Two times a day (BID) | ORAL | 0 refills | Status: AC
Start: 2021-11-18 — End: 2021-11-25

## 2021-11-18 MED ORDER — DOXYCYCLINE HYCLATE 100 MG PO CAPS: 100.0000 mg | ORAL_CAPSULE | Freq: Two times a day (BID) | ORAL | 1 refills | Status: AC

## 2021-11-18 NOTE — Addendum Note (Signed)
Addended by: Verita Schneiders A on: 11/18/2021 06:29 PM ? ? Modules accepted: Orders ? ?

## 2021-11-18 NOTE — Progress Notes (Signed)
Patient has chlamydia, this can cause irregular bleeding.  She had negative testing for other STIs.  However, she needs to let partner(s) know so the partner(s) can get testing and treatment. Patient and sex partner(s) should abstain from unprotected sexual activity for seven days after everyone receives appropriate treatment.  Doxycycline was prescribed for patient.  Of note, patient also has bacterial vaginitis, Metronidazole was also prescribed. Patient will need to return in about 4 weeks after treatment for repeat test of cure.  Please call to inform patient of results and recommendations, and advise to pick up prescriptions and take as directed.  Please advise patient to practice safe sex at all times. ? ?Results were also released to MyChart and patient was given recommendations as indicated. ? ? ?

## 2021-11-20 ENCOUNTER — Other Ambulatory Visit: Payer: Medicaid Other

## 2021-11-20 ENCOUNTER — Ambulatory Visit
Admission: RE | Admit: 2021-11-20 | Discharge: 2021-11-20 | Disposition: A | Discharge status: Home / Self Care | Payer: Medicaid Other | Source: Ambulatory Visit | Attending: Obstetrics & Gynecology | Admitting: Obstetrics & Gynecology

## 2021-11-20 DIAGNOSIS — N921 Excessive and frequent menstruation with irregular cycle: Secondary | ICD-10-CM | POA: Insufficient documentation

## 2021-11-20 DIAGNOSIS — N946 Dysmenorrhea, unspecified: Secondary | ICD-10-CM | POA: Insufficient documentation

## 2021-11-20 LAB — COAG STUDIES INTERP REPORT

## 2021-11-20 LAB — TSH+PRL+TESTT+TESTF+17OHP
17-Hydroxyprogesterone: 75 ng/dL
Prolactin: 24.1 ng/mL — ABNORMAL HIGH (ref 4.8–23.3)
TSH: 5.47 u[IU]/mL — ABNORMAL HIGH (ref 0.450–4.500)
Testosterone, Free: 2.4 pg/mL (ref 0.0–4.2)
Testosterone, Total, LC/MS: 27.5 ng/dL (ref 10.0–55.0)

## 2021-11-20 LAB — VON WILLEBRAND PANEL
Factor VIII Activity: 167 % — ABNORMAL HIGH (ref 56–140)
Von Willebrand Ag: 225 % — ABNORMAL HIGH (ref 50–200)
Von Willebrand Factor: 129 % (ref 50–200)

## 2021-11-20 LAB — BETA HCG QUANT (REF LAB): hCG Quant: <1 m[IU]/mL

## 2021-11-20 NOTE — Addendum Note (Signed)
Addended by: Verita Schneiders A on: 11/20/2021 09:15 AM ? ? Modules accepted: Orders ? ?

## 2021-11-24 ENCOUNTER — Telehealth: Payer: Self-pay

## 2021-11-24 NOTE — Telephone Encounter (Signed)
Patient calls nurse line requesting a supporting letter for this semester.  ? ?Patient reports she has missed "a lot of days" due to her medical condition.  ? ?Patient reports she needs a letter stating she can withdraw from spring semester 2023 with out penalty.  ? ?Patient is also requesting something for nausea.  ? ?Will forward to PCP.  ?

## 2021-11-25 NOTE — Telephone Encounter (Signed)
VM left for patient.

## 2021-11-25 NOTE — Telephone Encounter (Signed)
Patient scheduled for 5/11 with [place].  ?

## 2021-11-27 ENCOUNTER — Other Ambulatory Visit: Payer: Medicaid Other

## 2021-11-27 ENCOUNTER — Encounter: Payer: Self-pay | Admitting: Family Medicine

## 2021-11-27 ENCOUNTER — Ambulatory Visit (INDEPENDENT_AMBULATORY_CARE_PROVIDER_SITE_OTHER): Payer: Medicaid Other | Admitting: Family Medicine

## 2021-11-27 VITALS — BP 112/74 | HR 95 | Ht 69.0 in | Wt 237.6 lb

## 2021-11-27 DIAGNOSIS — E6609 Other obesity due to excess calories: Secondary | ICD-10-CM | POA: Diagnosis not present

## 2021-11-27 DIAGNOSIS — K5904 Chronic idiopathic constipation: Secondary | ICD-10-CM | POA: Diagnosis present

## 2021-11-27 DIAGNOSIS — R631 Polydipsia: Secondary | ICD-10-CM | POA: Diagnosis not present

## 2021-11-27 DIAGNOSIS — R7989 Other specified abnormal findings of blood chemistry: Secondary | ICD-10-CM

## 2021-11-27 DIAGNOSIS — Z6835 Body mass index (BMI) 35.0-35.9, adult: Secondary | ICD-10-CM

## 2021-11-27 NOTE — Progress Notes (Signed)
? ? ?  SUBJECTIVE:  ? ?CHIEF COMPLAINT / HPI:  ? ?Patient presents initially for a letter for school.  She notes that she has been out of her classes for the majority of the semester due to her medical conditions that have led to recurrent ER visits as well as office visits.  The complaints of mainly been regarding recurrent stomach pains and pelvic pain, she is now following with gynecology.  She has also been having more issues with her mental health. ?Patient would like to speak with gastroenterology regarding her recurrent abdominal pain and concern for IBS. ? ?Patient also notes that for the last several weeks that she has been more nauseous and is vomiting water and her medications.  She has more increased sensitivity to food smells.  She is not pregnant and is currently on birth control, she has taken the birth control for the last 2 weeks or so.  She does note increased thirst and always feels like she is dehydrated as well.  If ? ?PERTINENT  PMH / PSH: Reviewed ? ?OBJECTIVE:  ? ?BP 112/74   Pulse 95   Ht 5' 9"  (1.753 m)   Wt 237 lb 9.6 oz (107.8 kg)   LMP 10/30/2021 (Exact Date)   SpO2 100%   BMI 35.09 kg/m?   ?Gen: well-appearing, NAD ?CV: RRR, no m/r/g appreciated, no peripheral edema ?Pulm: CTAB, no wheezes/crackles ?GI: soft, diffusely tender, more prominent in the lower quadrants, nondistended, no rebound symptoms, bowel sounds appropriate ? ?ASSESSMENT/PLAN:  ? ?Constipation ?Patient continues to have issues with constipation and is concerned about IBS as this is a diagnosis her mother now has.  Patient and mother would like further evaluation with gastroenterology.  Given lack of improvement and continued symptoms, do feel it is overall appropriate. ?- Referral to GI placed ?  ?Polydipsia ?Patient reports increased thirst, BMI is 35.09.  Patient does have risk factors for diabetes, will screen with an A1c today. ?- A1c today ? ?Letter request ?Patient reports that she is needing a letter from  the physician agreeing with her choice if she chooses to medically withdraw from her classes. Since the start of the year, patient has had 8 office visits, about 13 video visits, 3 ER visits.  Most of his complaints have been related to pelvic pain, abdominal pain, viral illnesses and general anxiety disorder.  As this is the first time for me to be evaluating this patient, difficult to ascertain if this is fully inappropriate letter to make.  We will discuss with PCP to determine if request is appropriate. ? ?Brynli Ollis, DO ?[place]  ?

## 2021-11-27 NOTE — Patient Instructions (Signed)
I have sent in a referral to the GI doctor to follow-up with you regarding your issues with your bowels. ? ?Given your recent increase in thirst, we will go ahead and check an A1c to make sure that there is not any diabetes that is complicating the picture at this time. ? ?I am going to talk with your PCP Dr. Larae Grooms regarding your need for a letter as this is the first time that I am meeting you and there is more thorough review and discussion with her to ensure that this is an appropriate letter for her to make. ?

## 2021-11-27 NOTE — Assessment & Plan Note (Signed)
Patient continues to have issues with constipation and is concerned about IBS as this is a diagnosis her mother now has.  Patient and mother would like further evaluation with gastroenterology.  Given lack of improvement and continued symptoms, do feel it is overall appropriate. ?- Referral to GI placed ?

## 2021-11-28 LAB — T4, FREE: Free T4: 1.16 ng/dL (ref 0.82–1.77)

## 2021-11-28 LAB — HEMOGLOBIN A1C
Est. average glucose Bld gHb Est-mCnc: 100 mg/dL
Hgb A1c MFr Bld: 5.1 % (ref 4.8–5.6)

## 2021-11-28 LAB — PROLACTIN: Prolactin: 23.6 ng/mL — ABNORMAL HIGH (ref 4.8–23.3)

## 2021-11-28 LAB — T3, FREE: T3, Free: 3.1 pg/mL (ref 2.0–4.4)

## 2021-12-02 ENCOUNTER — Encounter: Payer: Self-pay | Admitting: Family Medicine

## 2021-12-11 ENCOUNTER — Encounter: Payer: Self-pay | Admitting: Obstetrics & Gynecology

## 2021-12-11 ENCOUNTER — Ambulatory Visit (INDEPENDENT_AMBULATORY_CARE_PROVIDER_SITE_OTHER): Payer: Medicaid Other | Admitting: Obstetrics & Gynecology

## 2021-12-11 VITALS — BP 127/77 | HR 71 | Ht 68.0 in | Wt 233.2 lb

## 2021-12-11 DIAGNOSIS — A749 Chlamydial infection, unspecified: Secondary | ICD-10-CM | POA: Diagnosis not present

## 2021-12-11 DIAGNOSIS — R102 Pelvic and perineal pain: Secondary | ICD-10-CM | POA: Diagnosis not present

## 2021-12-11 DIAGNOSIS — N921 Excessive and frequent menstruation with irregular cycle: Secondary | ICD-10-CM

## 2021-12-11 DIAGNOSIS — R9389 Abnormal findings on diagnostic imaging of other specified body structures: Secondary | ICD-10-CM

## 2021-12-11 MED ORDER — TRANEXAMIC ACID 650 MG PO TABS: 1300.0000 mg | ORAL_TABLET | Freq: Three times a day (TID) | ORAL | 2 refills | Status: DC

## 2021-12-11 NOTE — Progress Notes (Signed)
GYNECOLOGY OFFICE VISIT NOTE  History:   Jacqueline Gonzalez is a 21 y.o. G0P0000 here today for follow up for menorrhagia with irregular cycle.  Was started on OCPs on 11/13/2021, she reported having a lot of nausea and did not like how it made her feel. Still had pelvic pain and heavy bleeding that started during hormonal days, lasted for 10 days.  Does not want to continue on this. Of note, patient was diagnosed with chlamydia during last visit, completed treatment around 11/25/21 and reports her partner got treated too.  Also had an ultrasound that showed possible 11 mm polyp or submucosal fibroid.  She is interested in discussing other management for her AUB and her pelvic pain.  Of note, all these symptoms started in 10/2021; she never had heavy bleeding or pelvic pain before this.  Today, she denies any abnormal vaginal discharge, bleeding, pelvic pain or other concerns.    Past Medical History:  Diagnosis Date   Chlamydia 11/13/2021   Constipation, chronic    Environmental allergies    Ganglion cyst of dorsum of left wrist    RECURRENT   GERD (gastroesophageal reflux disease)    watches diet   Headache    IBS (irritable bowel syndrome)    Lactose intolerance     Past Surgical History:  Procedure Laterality Date   GANGLION CYST EXCISION Left 04/24/2021   Procedure: left recurrent dorsal carpal ganglion excision;  Surgeon: [name], MD;  Location: [facility];  Service: Orthopedics;  Laterality: Left;   TONSILLECTOMY AND ADENOIDECTOMY     AGE 79   WISDOM TOOTH EXTRACTION  03/18/2021   WRIST GANGLION EXCISION Left 02/2020    The following portions of the patient's history were reviewed and updated as appropriate: allergies, current medications, past family history, past medical history, past social history, past surgical history and problem list.   Review of Systems:  Pertinent items noted in HPI and remainder of comprehensive ROS otherwise negative.  Physical  Exam:  BP 127/77   Pulse 71   Ht 5' 8"  (1.727 m)   Wt 233 lb 3.2 oz (105.8 kg)   LMP 11/28/2021   BMI 35.46 kg/m  CONSTITUTIONAL: Well-developed, well-nourished female in no acute distress.  HEENT:  Normocephalic, atraumatic. External right and left ear normal. No scleral icterus.  NECK: Normal range of motion, supple, no masses noted on observation SKIN: No rash noted. Not diaphoretic. No erythema. No pallor. MUSCULOSKELETAL: Normal range of motion. No edema noted. NEUROLOGIC: Alert and oriented to person, place, and time. Normal muscle tone coordination. No cranial nerve deficit noted. PSYCHIATRIC: Normal mood and affect. Normal behavior. Normal judgment and thought content. CARDIOVASCULAR: Normal heart rate noted RESPIRATORY: Effort and breath sounds normal, no problems with respiration noted ABDOMEN: No masses noted. No other overt distention noted.   PELVIC: Deferred  Labs and Imaging US PELVIC COMPLETE WITH TRANSVAGINAL  Result Date: 11/20/2021 CLINICAL DATA:  Heavy menses EXAM: TRANSABDOMINAL AND TRANSVAGINAL ULTRASOUND OF PELVIS TECHNIQUE: Both transabdominal and transvaginal ultrasound examinations of the pelvis were performed. Transabdominal technique was performed for global imaging of the pelvis including uterus, ovaries, adnexal regions, and pelvic cul-de-sac. It was necessary to proceed with endovaginal exam following the transabdominal exam to visualize the uterus endometrium ovaries. COMPARISON:  Pelvic ultrasound 10/04/2017 FINDINGS: Uterus Measurements: 6.9 x 3.3 x 5.2 cm = volume: 61.2 mL. Possible subtle subserosal fibroid within the anterior uterine fundus measuring 17 x 11 x 12 mm. Endometrium Thickness: 2 mm. Possible submucosal  fibroid versus polyp measuring 11 x 8 x 11 mm. Right ovary Measurements: 3.9 x 1.8 x 2.4 cm = volume: 8.6 mL. Normal appearance/no adnexal mass. Left ovary Measurements: 2.8 x 2.2 x 1.7 cm = volume: 5.7 mL. Normal appearance/no adnexal mass. Other  findings Trace free fluid. IMPRESSION: 1. Submucosal fibroid versus polyp measuring 11 mm. Consider further evaluation with sonohysterogram for confirmation prior to hysteroscopy. Endometrial sampling should also be considered if patient is at high risk for endometrial carcinoma. (Ref: Radiological Reasoning: Algorithmic Workup of Abnormal Vaginal Bleeding with Endovaginal Sonography and Sonohysterography. AJR 2008; 882:C00-34) 2. Numerous bilateral ovarian follicles with peripheral orientation, correlate for polycystic ovary disease. 3. Trace free fluid in the pelvis Electronically Signed   By: Donavan Foil M.D.   On: 11/20/2021 15:27      Assessment and Plan:     1. Chlamydia Test of cure to be done in about 2 weeks.  The AUB and pain could be a result of this, given how recent all these symptoms started. This was discussed with patient, she may have had PID. - Cervicovaginal ancillary only; Future  2. Endometrial lesion on ultrasound Will evaluate further on sonohysterogram. If bleeding continues, may decide on doing hysteroscopic resection. - [place] Sonohysterogram; Future  3. Menorrhagia with irregular cycle OCPs discontinued for now. Recommended Lysteda and continue Ibuprofen. Will monitor response. - tranexamic acid (LYSTEDA) 650 MG TABS tablet; Take 2 tablets (1,300 mg total) by mouth 3 (three) times daily. Take during menses for a maximum of five days  Dispense: 30 tablet; Refill: 2  4. Pelvic pain in female If surgery is needed for the AUB, and pain continues, may consider adding diagnostic laparoscopy.  Since pain just started less than 2 months ago, less concerned about possible endometriosis.  Pain was also not alleviated on OCPs.  Concerned about chronic PID due to recent infection.   Routine preventative health maintenance measures emphasized. Please refer to After Visit Summary for other counseling recommendations.   Return in about 1 month (around 01/11/2022) for Followup (can be  virtual if patient desires).    I spent 30 minutes dedicated to the care of this patient including pre-visit review of records, face to face time with the patient discussing her conditions and treatments and post visit orders.    Verita Schneiders, MD, [facility] [employer], [place]

## 2021-12-16 ENCOUNTER — Ambulatory Visit
Admission: RE | Admit: 2021-12-16 | Discharge: 2021-12-16 | Disposition: A | Discharge status: Home / Self Care | Payer: Medicaid Other | Source: Ambulatory Visit | Attending: Emergency Medicine | Admitting: Emergency Medicine

## 2021-12-16 VITALS — BP 99/73 | HR 83 | Temp 98.3°F | Resp 18

## 2021-12-16 DIAGNOSIS — R519 Headache, unspecified: Secondary | ICD-10-CM

## 2021-12-16 DIAGNOSIS — G8929 Other chronic pain: Secondary | ICD-10-CM

## 2021-12-16 MED ORDER — KETOROLAC TROMETHAMINE 30 MG/ML IJ SOLN
30.0000 mg | Freq: Once | INTRAMUSCULAR | Status: AC
  Administered 2021-12-16: 30 mg via INTRAMUSCULAR

## 2021-12-16 MED ORDER — DIPHENHYDRAMINE HCL 50 MG PO CAPS
50.0000 mg | ORAL_CAPSULE | Freq: Once | ORAL | Status: AC
  Administered 2021-12-16: 50 mg via ORAL

## 2021-12-16 MED ORDER — ONDANSETRON 8 MG PO TBDP
8.0000 mg | ORAL_TABLET | Freq: Once | ORAL | Status: AC
  Administered 2021-12-16: 8 mg via ORAL

## 2021-12-16 MED ORDER — ONDANSETRON 4 MG PO TBDP
4.0000 mg | ORAL_TABLET | Freq: Once | ORAL | Status: DC
Start: 2021-12-16 — End: 2021-12-16

## 2021-12-16 NOTE — ED Triage Notes (Signed)
Pt c/o headache for 2 months.

## 2021-12-16 NOTE — Discharge Instructions (Signed)
During your visit today, you received an injection of ketorolac, Zofran dissolvable tablets and Benadryl capsules.  They should resolve your headache within the next hour.  Please go straight home to bed and get a good night sleep.  Please follow-up with your primary care provider regarding your chronic headaches to discuss preventative treatment options.  Thank you for visiting urgent care today.

## 2021-12-16 NOTE — ED Provider Notes (Signed)
[facility]    CSN: [id number] Arrival date & time: 12/16/21  1821    HISTORY   Chief Complaint  Patient presents with   Headache    Entered by patient   HPI Jacqueline Gonzalez is a 21 y.o. female. Patient complains of a 2 months history of progressively worsening headaches.  Patient states headaches are intermittent, intensity waxes and wanes.  Patient states she has not tried any interventions for her pain.  Patient states she has not discussed her headache issues with her primary care provider or gynecologist during any of her appointments on May 25, May 11, May 1, April 27, April 21, April 17, April 15, April 2.  Patient states her headache today was so bad that she missed class and needs a note to return to school.    The history is provided by the patient.    Past Medical History:  Diagnosis Date   Chlamydia 11/13/2021   Constipation, chronic    Environmental allergies    Ganglion cyst of dorsum of left wrist    RECURRENT   GERD (gastroesophageal reflux disease)    watches diet   Headache    IBS (irritable bowel syndrome)    Lactose intolerance    Patient Active Problem List   Diagnosis Date Noted   Chlamydia 11/18/2021   Menorrhagia with irregular cycle 11/18/2021   Dysmenorrhea 11/18/2021   Anemia 11/17/2021   Lower abdominal pain 11/07/2021   Moderate episode of recurrent major depressive disorder ([place]) 06/23/2021   Low back pain 05/02/2021   Headache 09/03/2020   Nonspecific abdominal pain 08/30/2020   Ganglion cyst of dorsum of left wrist 11/03/2019   Constipation 01/21/2018   Irregular periods 01/21/2018   Generalized anxiety disorder 01/21/2018   Past Surgical History:  Procedure Laterality Date   GANGLION CYST EXCISION Left 04/24/2021   Procedure: left recurrent dorsal carpal ganglion excision;  Surgeon: [name], MD;  Location: [facility];  Service: Orthopedics;  Laterality: Left;   TONSILLECTOMY AND ADENOIDECTOMY      AGE 86   WISDOM TOOTH EXTRACTION  03/18/2021   WRIST GANGLION EXCISION Left 02/2020   OB History     Gravida  0   Para  0   Term  0   Preterm  0   AB  0   Living  0      SAB  0   IAB  0   Ectopic  0   Multiple  0   Live Births  0          Home Medications    Prior to Admission medications   Medication Sig Start Date End Date Taking? Authorizing Provider  FLUoxetine (PROZAC) 20 MG capsule Take 1 capsule (20 mg total) by mouth daily. 11/07/21 11/07/22  Nwoko, Terese Door, PA  fluticasone (FLONASE) 50 MCG/ACT nasal spray Place 2 sprays into both nostrils daily. 10/19/21   Evelina Dun A, FNP  ibuprofen (ADVIL) 800 MG tablet Take 1 tablet (800 mg total) by mouth 3 (three) times daily with meals as needed. 11/13/21   Anyanwu, Sallyanne Havers, MD  polyethylene glycol powder (GLYCOLAX/MIRALAX) 17 GM/SCOOP powder Take 17 g by mouth in the morning and at bedtime. 07/23/21   Zola Button, MD  tranexamic acid (LYSTEDA) 650 MG TABS tablet Take 2 tablets (1,300 mg total) by mouth 3 (three) times daily. Take during menses for a maximum of five days 12/11/21   Anyanwu, Sallyanne Havers, MD    Family History  Family History  Problem Relation Age of Onset   Asthma Father    Diabetes Maternal Grandmother    Diabetes Paternal Grandmother    Kidney disease Paternal Alanda Slim' disease Paternal Aunt    Social History Social History   Tobacco Use   Smoking status: Never   Smokeless tobacco: Never  Vaping Use   Vaping Use: Never used  Substance Use Topics   Alcohol use: Never   Drug use: Never   Allergies   Blue dyes (parenteral), Blueberry [vaccinium angustifolium], Raspberry, and Shellfish allergy  Review of Systems Review of Systems Pertinent findings noted in history of present illness.   Physical Exam Triage Vital Signs ED Triage Vitals  Enc Vitals Group     BP 05/16/21 0827 (!) 147/82     Pulse Rate 05/16/21 0827 72     Resp 05/16/21 0827 18     Temp 05/16/21 0827  98.3 F (36.8 C)     Temp Source 05/16/21 0827 Oral     SpO2 05/16/21 0827 98 %     Weight --      Height --      Head Circumference --      Peak Flow --      Pain Score 05/16/21 0826 5     Pain Loc --      Pain Edu? --      Excl. in [place]? --   No data found.  Updated Vital Signs BP 99/73 (BP Location: Left Arm)   Pulse 83   Temp 98.3 F (36.8 C) (Oral)   Resp 18   LMP 11/28/2021   SpO2 97%   Physical Exam Vitals and nursing note reviewed.  Constitutional:      General: She is not in acute distress.    Appearance: Normal appearance. She is not ill-appearing.  HENT:     Head: Normocephalic and atraumatic.     Salivary Glands: Right salivary gland is not diffusely enlarged or tender. Left salivary gland is not diffusely enlarged or tender.     Right Ear: Tympanic membrane, ear canal and external ear normal. No drainage. No middle ear effusion. There is no impacted cerumen. Tympanic membrane is not erythematous or bulging.     Left Ear: Tympanic membrane, ear canal and external ear normal. No drainage.  No middle ear effusion. There is no impacted cerumen. Tympanic membrane is not erythematous or bulging.     Nose: Nose normal. No nasal deformity, septal deviation, mucosal edema, congestion or rhinorrhea.     Right Turbinates: Not enlarged, swollen or pale.     Left Turbinates: Not enlarged, swollen or pale.     Right Sinus: No maxillary sinus tenderness or frontal sinus tenderness.     Left Sinus: No maxillary sinus tenderness or frontal sinus tenderness.     Mouth/Throat:     Lips: Pink. No lesions.     Mouth: Mucous membranes are moist. No oral lesions.     Pharynx: Oropharynx is clear. Uvula midline. No posterior oropharyngeal erythema or uvula swelling.     Tonsils: No tonsillar exudate. 0 on the right. 0 on the left.  Eyes:     General: Lids are normal.        Right eye: No discharge.        Left eye: No discharge.     Extraocular Movements: Extraocular movements  intact.     Conjunctiva/sclera: Conjunctivae normal.     Right eye: Right conjunctiva is not  injected.     Left eye: Left conjunctiva is not injected.  Neck:     Trachea: Trachea and phonation normal.  Cardiovascular:     Rate and Rhythm: Normal rate and regular rhythm.     Pulses: Normal pulses.     Heart sounds: Normal heart sounds. No murmur heard.   No friction rub. No gallop.  Pulmonary:     Effort: Pulmonary effort is normal. No accessory muscle usage, prolonged expiration or respiratory distress.     Breath sounds: Normal breath sounds. No stridor, decreased air movement or transmitted upper airway sounds. No decreased breath sounds, wheezing, rhonchi or rales.  Chest:     Chest wall: No tenderness.  Musculoskeletal:        General: Normal range of motion.     Cervical back: Normal range of motion and neck supple. Normal range of motion.  Lymphadenopathy:     Cervical: No cervical adenopathy.  Skin:    General: Skin is warm and dry.     Findings: No erythema or rash.  Neurological:     General: No focal deficit present.     Mental Status: She is alert and oriented to person, place, and time.  Psychiatric:        Mood and Affect: Mood normal.        Behavior: Behavior normal.    Visual Acuity Right Eye Distance:   Left Eye Distance:   Bilateral Distance:    Right Eye Near:   Left Eye Near:    Bilateral Near:     UC Couse / Diagnostics / Procedures:    EKG  Radiology No results found.  Procedures Procedures (including critical care time)  UC Diagnoses / Final Clinical Impressions(s)   I have reviewed the triage vital signs and the nursing notes.  Pertinent labs & imaging results that were available during my care of the patient were reviewed by me and considered in my medical decision making (see chart for details).    Final diagnoses:  Acute nonintractable headache, unspecified headache type  Chronic nonintractable headache, unspecified headache type    Patient was amenable to abortive treatment during her visit today.  Patient was provided with an injection of ketorolac, Zofran p.o. and Benadryl p.o.  Patient provided with a note to return to school.  ED Prescriptions   None    PDMP not reviewed this encounter.  Pending results:  Labs Reviewed - No data to display  Medications Ordered in UC: Medications  ketorolac (TORADOL) 30 MG/ML injection 30 mg (30 mg Intramuscular Given 12/16/21 2027)  diphenhydrAMINE (BENADRYL) capsule 50 mg (50 mg Oral Given 12/16/21 2027)  ondansetron (ZOFRAN-ODT) disintegrating tablet 8 mg (8 mg Oral Given 12/16/21 2027)    Disposition Upon Discharge:  Condition: stable for discharge home Home: take medications as prescribed; routine discharge instructions as discussed; follow up as advised.  Patient presented with an acute illness with associated systemic symptoms and significant discomfort requiring urgent management. In my opinion, this is a condition that a prudent lay person (someone who possesses an average knowledge of health and medicine) may potentially expect to result in complications if not addressed urgently such as respiratory distress, impairment of bodily function or dysfunction of bodily organs.   Routine symptom specific, illness specific and/or disease specific instructions were discussed with the patient and/or caregiver at length.   As such, the patient has been evaluated and assessed, work-up was performed and treatment was provided in alignment with urgent care protocols  and evidence based medicine.  Patient/parent/caregiver has been advised that the patient may require follow up for further testing and treatment if the symptoms continue in spite of treatment, as clinically indicated and appropriate.  Patient/parent/caregiver has been advised to return to the [facility] or PCP if no better; to PCP or the Emergency Department if new signs and symptoms develop, or if the current signs or symptoms  continue to change or worsen for further workup, evaluation and treatment as clinically indicated and appropriate  The patient will follow up with their current PCP if and as advised. If the patient does not currently have a PCP we will assist them in obtaining one.   The patient may need specialty follow up if the symptoms continue, in spite of conservative treatment and management, for further workup, evaluation, consultation and treatment as clinically indicated and appropriate.   Patient/parent/caregiver verbalized understanding and agreement of plan as discussed.  All questions were addressed during visit.  Please see discharge instructions below for further details of plan.  Discharge Instructions:   Discharge Instructions      During your visit today, you received an injection of ketorolac, Zofran dissolvable tablets and Benadryl capsules.  They should resolve your headache within the next hour.  Please go straight home to bed and get a good night sleep.  Please follow-up with your primary care provider regarding your chronic headaches to discuss preventative treatment options.  Thank you for visiting urgent care today.    This office note has been dictated using Museum/gallery curator.  Unfortunately, and despite my best efforts, this method of dictation can sometimes lead to occasional typographical or grammatical errors.  I apologize in advance if this occurs.     Lynden Oxford Scales, [place] 12/16/21 2055

## 2021-12-22 ENCOUNTER — Telehealth: Payer: Medicaid Other | Admitting: Physician Assistant

## 2021-12-22 DIAGNOSIS — M545 Low back pain, unspecified: Secondary | ICD-10-CM

## 2021-12-22 MED ORDER — NAPROXEN 500 MG PO TABS: 500.0000 mg | ORAL_TABLET | Freq: Two times a day (BID) | ORAL | 0 refills | Status: DC

## 2021-12-22 MED ORDER — CYCLOBENZAPRINE HCL 10 MG PO TABS: 5.0000 mg | ORAL_TABLET | Freq: Three times a day (TID) | ORAL | 0 refills | Status: DC | PRN

## 2021-12-22 NOTE — Progress Notes (Signed)
We are sorry that you are not feeling well.  Here is how we plan to help!  Based on what you have shared with me it looks like you mostly have acute back pain.  Acute back pain is defined as musculoskeletal pain that can resolve in 1-3 weeks with conservative treatment.  I have prescribed Naprosyn 500 mg take one by mouth twice a day non-steroid anti-inflammatory (NSAID) as well as Flexeril 10 mg every eight hours as needed which is a muscle relaxer  Some patients experience stomach irritation or in increased heartburn with anti-inflammatory drugs.  Please keep in mind that muscle relaxer's can cause fatigue and should not be taken while at work or driving.  Back pain is very common.  The pain often gets better over time.  The cause of back pain is usually not dangerous.  Most people can learn to manage their back pain on their own.  It is most likely these medications may not help a months long issue. If not improving please be seen in person for further evaluation.   Home Care Stay active.  Start with short walks on flat ground if you can.  Try to walk farther each day. Do not sit, drive or stand in one place for more than 30 minutes.  Do not stay in bed. Do not avoid exercise or work.  Activity can help your back heal faster. Be careful when you bend or lift an object.  Bend at your knees, keep the object close to you, and do not twist. Sleep on a firm mattress.  Lie on your side, and bend your knees.  If you lie on your back, put a pillow under your knees. Only take medicines as told by your doctor. Put ice on the injured area. Put ice in a plastic bag Place a towel between your skin and the bag Leave the ice on for 15-20 minutes, 3-4 times a day for the first 2-3 days. 210 After that, you can switch between ice and heat packs. Ask your doctor about back exercises or massage. Avoid feeling anxious or stressed.  Find good ways to deal with stress, such as exercise.  Get Help Right Way  If: Your pain does not go away with rest or medicine. Your pain does not go away in 1 week. You have new problems. You do not feel well. The pain spreads into your legs. You cannot control when you poop (bowel movement) or pee (urinate) You feel sick to your stomach (nauseous) or throw up (vomit) You have belly (abdominal) pain. You feel like you may pass out (faint). If you develop a fever.  Make Sure you: Understand these instructions. Will watch your condition Will get help right away if you are not doing well or get worse.  Your e-visit answers were reviewed by a board certified advanced clinical practitioner to complete your personal care plan.  Depending on the condition, your plan could have included both over the counter or prescription medications.  If there is a problem please reply  once you have received a response from your provider.  Your safety is important to [place].  If you have drug allergies check your prescription carefully.    You can use MyChart to ask questions about today's visit, request a non-urgent call back, or ask for a work or school excuse for 24 hours related to this e-Visit. If it has been greater than 24 hours you will need to follow up with your provider, or enter  a new e-Visit to address those concerns.  You will get an e-mail in the next two days asking about your experience.  I hope that your e-visit has been valuable and will speed your recovery. Thank you for using e-visits.   I provided 5 minutes of non face-to-face time during this encounter for chart review and documentation.

## 2021-12-23 ENCOUNTER — Encounter: Payer: Self-pay | Admitting: *Deleted

## 2021-12-25 ENCOUNTER — Ambulatory Visit: Payer: Medicaid Other

## 2021-12-29 ENCOUNTER — Ambulatory Visit: Payer: Medicaid Other | Admitting: Family Medicine

## 2021-12-31 ENCOUNTER — Ambulatory Visit (INDEPENDENT_AMBULATORY_CARE_PROVIDER_SITE_OTHER): Payer: Medicaid Other | Admitting: Family Medicine

## 2021-12-31 VITALS — BP 130/71 | Ht 68.0 in | Wt 233.4 lb

## 2021-12-31 DIAGNOSIS — G44229 Chronic tension-type headache, not intractable: Secondary | ICD-10-CM | POA: Diagnosis present

## 2021-12-31 DIAGNOSIS — Z7689 Persons encountering health services in other specified circumstances: Secondary | ICD-10-CM

## 2021-12-31 DIAGNOSIS — F331 Major depressive disorder, recurrent, moderate: Secondary | ICD-10-CM | POA: Diagnosis not present

## 2021-12-31 DIAGNOSIS — F411 Generalized anxiety disorder: Secondary | ICD-10-CM

## 2021-12-31 MED ORDER — FLUOXETINE HCL 40 MG PO CAPS: 40.0000 mg | ORAL_CAPSULE | Freq: Every day | ORAL | 3 refills | Status: DC

## 2021-12-31 NOTE — Patient Instructions (Signed)
It was great seeing you today!  I have placed a referral to neurology for your chronic headaches and for a sleep study, they will call you to set up an appointment  I have increased your Prozac to 65m daily   Please check-out at the front desk before leaving the clinic. Schedule to see your PCP in about 2 weeks to check on your mood, but if you need to be seen earlier than that for any new issues we're happy to fit you in, just give [place] call!  Visit Reminders: - Stop by the pharmacy to pick up your prescriptions  - Continue to work on your healthy eating habits and incorporating exercise into your daily life.   Feel free to call with any questions or concerns at any time, at [phone]   Take care,  Dr[facility]

## 2021-12-31 NOTE — Progress Notes (Signed)
    SUBJECTIVE:   CHIEF COMPLAINT / HPI:   Jacqueline Gonzalez is a 21 yo who presents for headaches off and on since 2019. Has worsened in the past month. Hurts to open eyes. Head hurts mostly in the front of her head but also all around her head. Has nausea as well but unsure if related to headaches. No throwing up. Not sure if light or sound make it worse. Has tried Excedrin, Tylenol, ibuprofen. Patient has tried Sumitriptan in the past and has had a reaction to it where her heart was racing really fast and she didn't feel well.   Sleeps for only 30 minutes at a time. Does snore. Has woken up to gasping for air. Does not watch tv before bed. Lays in the dark in attempts to fall sleep. Endorses increased anxiety    PERTINENT  PMH / PSH: Reviewed   OBJECTIVE:   BP 130/71   Ht 5' 8"  (1.727 m)   Wt 233 lb 6 oz (105.9 kg)   LMP 11/28/2021   BMI 35.48 kg/m    General: alert, NAD CV: RRR no murmurs Resp: CTAB normal WOB GI: soft, non distended  Derm: warm, dry. No edema  Neuro: CN 2-12 in tact. Normal sensation of extremities bilaterally Psych: mood down, affect flat, speech coherent   ASSESSMENT/PLAN:   Generalized anxiety disorder PHQ9 score of 9. No SI/HI. Currently on Prozac 67m and tolerating well. Given increased depression and anxiety will increase to 480mdaily. Recommended PCP follow up in 2 weeks to check on mood.   Headache Patient endorses headaches for the past 4 years mainly in the front of her head but also wraps around. Likely stress induced and also potentially related to lack of sleep and worsening mood. Not improved with Excedrin, NSAIDs, and had a negative reaction to sumatriptan. Normal neuro exam. Referring to Neurology for further evaluation and treatment. Also referring for sleep study.     [place]

## 2022-01-01 ENCOUNTER — Ambulatory Visit: Payer: Medicaid Other | Admitting: Family Medicine

## 2022-01-04 NOTE — Assessment & Plan Note (Signed)
PHQ9 score of 9. No SI/HI. Currently on Prozac 63m and tolerating well. Given increased depression and anxiety will increase to 4102mdaily. Recommended PCP follow up in 2 weeks to check on mood.

## 2022-01-04 NOTE — Assessment & Plan Note (Signed)
Patient endorses headaches for the past 4 years mainly in the front of her head but also wraps around. Likely stress induced and also potentially related to lack of sleep and worsening mood. Not improved with Excedrin, NSAIDs, and had a negative reaction to sumatriptan. Normal neuro exam. Referring to Neurology for further evaluation and treatment. Also referring for sleep study.

## 2022-01-05 ENCOUNTER — Encounter: Payer: Self-pay | Admitting: Neurology

## 2022-01-06 ENCOUNTER — Ambulatory Visit
Admission: RE | Admit: 2022-01-06 | Discharge: 2022-01-06 | Disposition: A | Discharge status: Home / Self Care | Payer: Medicaid Other | Source: Ambulatory Visit | Attending: Urgent Care | Admitting: Urgent Care

## 2022-01-06 VITALS — BP 130/87 | HR 90 | Temp 98.4°F | Resp 20

## 2022-01-06 DIAGNOSIS — Z789 Other specified health status: Secondary | ICD-10-CM | POA: Diagnosis not present

## 2022-01-06 DIAGNOSIS — L089 Local infection of the skin and subcutaneous tissue, unspecified: Secondary | ICD-10-CM | POA: Diagnosis not present

## 2022-01-06 DIAGNOSIS — G8929 Other chronic pain: Secondary | ICD-10-CM | POA: Diagnosis not present

## 2022-01-06 DIAGNOSIS — R519 Headache, unspecified: Secondary | ICD-10-CM | POA: Diagnosis not present

## 2022-01-06 LAB — POCT URINE PREGNANCY: Preg Test, Ur: NEGATIVE

## 2022-01-06 MED ORDER — NAPROXEN 500 MG PO TABS: 500.0000 mg | ORAL_TABLET | Freq: Two times a day (BID) | ORAL | 0 refills | Status: DC

## 2022-01-06 MED ORDER — FLUCONAZOLE 150 MG PO TABS: 150.0000 mg | ORAL_TABLET | ORAL | 0 refills | Status: DC

## 2022-01-06 MED ORDER — DOXYCYCLINE HYCLATE 100 MG PO CAPS: 100.0000 mg | ORAL_CAPSULE | Freq: Two times a day (BID) | ORAL | 0 refills | Status: DC

## 2022-01-06 NOTE — ED Provider Notes (Signed)
Jacqueline Gonzalez   MRN: [id number] DOB: 2001-05-10  Subjective:   Jacqueline Gonzalez is a 21 y.o. female presenting for 3-day history of acute onset persistent swelling and pain about the umbilicus where she has her piercing.  Has been oozing and draining.  She did get the piercing about a year ago.  Initially she was more consistent about cleaning the piercing and the ring itself but not lately.  She would also like recommendations on her chronic headaches that she has had for months now.  Has been seen multiple times for this.  Has had extensive work-up done.  Has not seen a neurologist.  LMP was 12/08/2021.  Reports that she is not regular.  No current facility-administered medications for this encounter.  Current Outpatient Medications:    doxycycline (VIBRAMYCIN) 100 MG capsule, Take 1 capsule (100 mg total) by mouth 2 (two) times daily., Disp: 20 capsule, Rfl: 0   fluconazole (DIFLUCAN) 150 MG tablet, Take 1 tablet (150 mg total) by mouth once a week., Disp: 2 tablet, Rfl: 0   cyclobenzaprine (FLEXERIL) 10 MG tablet, Take 0.5-1 tablets (5-10 mg total) by mouth 3 (three) times daily as needed for muscle spasms. (Patient not taking: Reported on 12/31/2021), Disp: 30 tablet, Rfl: 0   FLUoxetine (PROZAC) 40 MG capsule, Take 1 capsule (40 mg total) by mouth daily., Disp: 90 capsule, Rfl: 3   fluticasone (FLONASE) 50 MCG/ACT nasal spray, Place 2 sprays into both nostrils daily., Disp: 16 g, Rfl: 6   ibuprofen (ADVIL) 800 MG tablet, Take 1 tablet (800 mg total) by mouth 3 (three) times daily with meals as needed., Disp: 90 tablet, Rfl: 4   polyethylene glycol powder (GLYCOLAX/MIRALAX) 17 GM/SCOOP powder, Take 17 g by mouth in the morning and at bedtime., Disp: 850 g, Rfl: 3   tranexamic acid (LYSTEDA) 650 MG TABS tablet, Take 2 tablets (1,300 mg total) by mouth 3 (three) times daily. Take during menses for a maximum of five days, Disp: 30 tablet, Rfl: 2   Allergies  Allergen  Reactions   Blue Dyes (Parenteral) Anaphylaxis   Blueberry [Vaccinium Angustifolium] Anaphylaxis   Raspberry Anaphylaxis   Shellfish Allergy Shortness Of Breath    ALL SHELLFISH    Past Medical History:  Diagnosis Date   Chlamydia 11/13/2021   Constipation, chronic    Environmental allergies    Ganglion cyst of dorsum of left wrist    RECURRENT   GERD (gastroesophageal reflux disease)    watches diet   Headache    IBS (irritable bowel syndrome)    Lactose intolerance      Past Surgical History:  Procedure Laterality Date   GANGLION CYST EXCISION Left 04/24/2021   Procedure: left recurrent dorsal carpal ganglion excision;  Surgeon: [name], MD;  Location: [place];  Service: Orthopedics;  Laterality: Left;   TONSILLECTOMY AND ADENOIDECTOMY     AGE 84   WISDOM TOOTH EXTRACTION  03/18/2021   WRIST GANGLION EXCISION Left 02/2020    Family History  Problem Relation Age of Onset   Asthma Father    Diabetes Maternal Grandmother    Diabetes Paternal Grandmother    Kidney disease Paternal Alanda Slim' disease Paternal Aunt     Social History   Tobacco Use   Smoking status: Never   Smokeless tobacco: Never  Vaping Use   Vaping Use: Never used  Substance Use Topics   Alcohol use: Never   Drug use: Never  ROS   Objective:   Vitals: BP 130/87   Pulse 90   Temp 98.4 F (36.9 C)   Resp 20   LMP 11/28/2021   SpO2 98%   Physical Exam Constitutional:      General: She is not in acute distress.    Appearance: Normal appearance. She is well-developed. She is not ill-appearing, toxic-appearing or diaphoretic.  HENT:     Head: Normocephalic and atraumatic.     Right Ear: External ear normal.     Left Ear: External ear normal.     Nose: Nose normal.     Mouth/Throat:     Mouth: Mucous membranes are moist.  Eyes:     General: No scleral icterus.       Right eye: No discharge.        Left eye: No discharge.     Extraocular  Movements: Extraocular movements intact.     Pupils: Pupils are equal, round, and reactive to light.  Cardiovascular:     Rate and Rhythm: Normal rate.  Pulmonary:     Effort: Pulmonary effort is normal.  Skin:    General: Skin is warm and dry.  Neurological:     General: No focal deficit present.     Mental Status: She is alert and oriented to person, place, and time.     Cranial Nerves: No cranial nerve deficit, dysarthria or facial asymmetry.     Sensory: No sensory deficit.     Motor: No weakness.     Coordination: Romberg sign negative. Coordination normal.     Gait: Gait normal.     Deep Tendon Reflexes: Reflexes normal.  Psychiatric:        Mood and Affect: Mood normal.        Speech: Speech normal.        Behavior: Behavior normal.        Thought Content: Thought content normal.        Judgment: Judgment normal.     Results for orders placed or performed during the hospital encounter of 01/06/22 (from the past 24 hour(s))  POCT urine pregnancy     Status: None   Collection Time: 01/06/22  9:45 AM  Result Value Ref Range   Preg Test, Ur Negative Negative    Assessment and Plan :   PDMP not reviewed this encounter.  1. Skin infection   2. Body piercing   3. Chronic nonintractable headache, unspecified headache type    Recommended doxycycline for the infected piercing.  Oral fluconazole for antibiotic associated yeast infection.  Reviewed sources of headaches which is chronic in nature.  Neurologic exam is completely unremarkable, no signs of an acute encephalopathy.  She has had extensive work-up of which I cannot really build upon.  Patient may benefit from having a head CT or MRI as deemed necessary by specialist.  In the meantime, recommended naproxen for her headaches.  I did review basic sources of headaches which patient can start to address such as hydrating much better, trying to get regular hours of sleep and regular meals which she does not do. Counseled  patient on potential for adverse effects with medications prescribed/recommended today, ER and return-to-clinic precautions discussed, patient verbalized understanding.    Jaynee Eagles, PA-C 01/06/22 1002

## 2022-01-06 NOTE — ED Triage Notes (Signed)
Pt here with naval piercing that is swollen and oozing and tender x 3 days. Hs had it for one year. Pt also c/o headache she has has for 2-3 months that she has been seen for, but states it is now affecting her vision.

## 2022-01-09 ENCOUNTER — Encounter (HOSPITAL_COMMUNITY): Payer: Self-pay | Admitting: Physician Assistant

## 2022-01-09 ENCOUNTER — Telehealth (INDEPENDENT_AMBULATORY_CARE_PROVIDER_SITE_OTHER): Payer: Medicaid Other | Admitting: Physician Assistant

## 2022-01-09 DIAGNOSIS — F331 Major depressive disorder, recurrent, moderate: Secondary | ICD-10-CM

## 2022-01-09 DIAGNOSIS — F411 Generalized anxiety disorder: Secondary | ICD-10-CM

## 2022-01-12 ENCOUNTER — Ambulatory Visit: Payer: Medicaid Other | Admitting: Family Medicine

## 2022-01-13 NOTE — Progress Notes (Addendum)
[place] MD/PA/NP OP Progress Note  Virtual Visit via Telephone Note  I connected with Jacqueline Gonzalez on 01/09/22 at  2:00 PM EDT by telephone and verified that I am speaking with the correct person using two identifiers.  Location: Patient: Home Provider: Clinic   I discussed the limitations, risks, security and privacy concerns of performing an evaluation and management service by telephone and the availability of in person appointments. I also discussed with the patient that there may be a patient responsible charge related to this service. The patient expressed understanding and agreed to proceed.  Follow Up Instructions:   I discussed the assessment and treatment plan with the patient. The patient was provided an opportunity to ask questions and all were answered. The patient agreed with the plan and demonstrated an understanding of the instructions.   The patient was advised to call back or seek an in-person evaluation if the symptoms worsen or if the condition fails to improve as anticipated.  I provided 13 minutes of non-face-to-face time during this encounter.  Malachy Mood, PA    01/09/2022 2:30 PM Jacqueline Gonzalez  MRN:  [id number]  Chief Complaint:  Chief Complaint  Patient presents with   Follow-up   Medication Management    HPI:   Jacqueline Gonzalez is a 21 year old female with a past psychiatric history significant for generalized anxiety disorder and major depressive disorder who presents to [facility] via virtual telephone visit for follow-up and medication management.  Patient is currently being managed on the following medications: Fluoxetine (Prozac) 20 mg daily.  Patient reports that she recently went to her primary care provider to talk about her anxiety.  Patient reports that she is still experiencing elevated anxiety she rates an 8 or 9 out of 10.  Patient's anxiety appears to be attributed to school.  Patient's anxiety  is alleviated through listening to music.  Patient experiences depression every other day.  She reports that her depression occurs during intense situations or randomly.  Patient is still taking her Prozac 20 mg daily but states that she occasionally forgets to take her medication.  Patient's current stressors include financial aid and school.  A GAD-7 screen was performed with the patient scoring an 18.  A PHQ-9 screen was performed with the patient scoring a 13.  Patient is alert and oriented x4, calm, cooperative, and fully engaged in conversation during the encounter.  Patient endorses fluctuating mood.  Patient denies suicidal or homicidal ideations.  She further denies auditory or visual hallucinations and does not appear to be responding to internal/external stimuli.  Patient endorses fluctuating sleep and receives on average 5 or 6 hours of sleep each night.  Patient states that sometimes she feels full of energy even after only receiving 2 to 3 hours of sleep.  Patient endorses decreased appetite and eats on average 1-2 meals per day.  Patient denies alcohol consumption, tobacco use, and illicit drug use.  Visit Diagnosis:  No diagnosis found.   Past Psychiatric History:  Major depressive disorder Generalized anxiety disorder  Past Medical History:  Past Medical History:  Diagnosis Date   Chlamydia 11/13/2021   Constipation, chronic    Environmental allergies    Ganglion cyst of dorsum of left wrist    RECURRENT   GERD (gastroesophageal reflux disease)    watches diet   Headache    IBS (irritable bowel syndrome)    Lactose intolerance     Past Surgical History:  Procedure Laterality Date  GANGLION CYST EXCISION Left 04/24/2021   Procedure: left recurrent dorsal carpal ganglion excision;  Surgeon: [name], MD;  Location: [facility];  Service: Orthopedics;  Laterality: Left;   TONSILLECTOMY AND ADENOIDECTOMY     AGE 78   WISDOM TOOTH EXTRACTION  03/18/2021    WRIST GANGLION EXCISION Left 02/2020    Family Psychiatric History:  Father - patient denies that her father has been officially diagnosed with any mental health diagnoses but states that he suffers from mental health.   Patient also believes that her Aunt (maternal) also suffers from mental health.  Family History:  Family History  Problem Relation Age of Onset   Asthma Father    Diabetes Maternal Grandmother    Diabetes Paternal Grandmother    Kidney disease Paternal Alanda Slim' disease Paternal Aunt     Social History:  Social History   Socioeconomic History   Marital status: Single    Spouse name: Not on file   Number of children: Not on file   Years of education: Not on file   Highest education level: Not on file  Occupational History   Not on file  Tobacco Use   Smoking status: Never   Smokeless tobacco: Never  Vaping Use   Vaping Use: Never used  Substance and Sexual Activity   Alcohol use: Never   Drug use: Never   Sexual activity: Yes    Birth control/protection: OCP  Other Topics Concern   Not on file  Social History Narrative   Not on file   Social Determinants of Health   Financial Resource Strain: Not on file  Food Insecurity: Not on file  Transportation Needs: Not on file  Physical Activity: Not on file  Stress: No Stress Concern Present (07/04/2021)   [place]    Feeling of Stress : Only a little  Recent Concern: Stress - Stress Concern Present (06/20/2021)   [place]    Feeling of Stress : To some extent  Social Connections: Not on file    Allergies:  Allergies  Allergen Reactions   Blue Dyes (Parenteral) Anaphylaxis   Blueberry [Vaccinium Angustifolium] Anaphylaxis   Raspberry Anaphylaxis   Shellfish Allergy Shortness Of Breath    ALL SHELLFISH    Metabolic Disorder Labs: Lab Results   Component Value Date   HGBA1C 5.1 11/27/2021   Lab Results  Component Value Date   PROLACTIN 23.6 (H) 11/27/2021   PROLACTIN 24.1 (H) 11/13/2021   No results found for: "CHOL", "TRIG", "HDL", "CHOLHDL", "VLDL", "LDLCALC" Lab Results  Component Value Date   TSH 6.370 (H) 11/17/2021   TSH 5.470 (H) 11/13/2021    Therapeutic Level Labs: No results found for: "LITHIUM" No results found for: "VALPROATE" No results found for: "CBMZ"  Current Medications: Current Outpatient Medications  Medication Sig Dispense Refill   cyclobenzaprine (FLEXERIL) 10 MG tablet Take 0.5-1 tablets (5-10 mg total) by mouth 3 (three) times daily as needed for muscle spasms. (Patient not taking: Reported on 12/31/2021) 30 tablet 0   doxycycline (VIBRAMYCIN) 100 MG capsule Take 1 capsule (100 mg total) by mouth 2 (two) times daily. 20 capsule 0   fluconazole (DIFLUCAN) 150 MG tablet Take 1 tablet (150 mg total) by mouth once a week. 2 tablet 0   FLUoxetine (PROZAC) 40 MG capsule Take 1 capsule (40 mg total) by mouth daily. 90 capsule 3   fluticasone (FLONASE)  50 MCG/ACT nasal spray Place 2 sprays into both nostrils daily. 16 g 6   ibuprofen (ADVIL) 800 MG tablet Take 1 tablet (800 mg total) by mouth 3 (three) times daily with meals as needed. 90 tablet 4   naproxen (NAPROSYN) 500 MG tablet Take 1 tablet (500 mg total) by mouth 2 (two) times daily with a meal. 30 tablet 0   polyethylene glycol powder (GLYCOLAX/MIRALAX) 17 GM/SCOOP powder Take 17 g by mouth in the morning and at bedtime. 850 g 3   tranexamic acid (LYSTEDA) 650 MG TABS tablet Take 2 tablets (1,300 mg total) by mouth 3 (three) times daily. Take during menses for a maximum of five days 30 tablet 2   No current facility-administered medications for this visit.     Musculoskeletal: Strength & Muscle Tone: Unable to assess due to telemedicine visit [place]: Unable to assess due to telemedicine visit Patient leans: Unable to assess due to  telemedicine visit  Psychiatric Specialty Exam: Review of Systems  Psychiatric/Behavioral:  Positive for sleep disturbance. Negative for decreased concentration, dysphoric mood, hallucinations, self-injury and suicidal ideas. The patient is nervous/anxious. The patient is not hyperactive.     Last menstrual period 11/28/2021.There is no height or weight on file to calculate BMI.  General Appearance: Unable to assess due to telemedicine visit  Eye Contact:  Unable to assess due to telemedicine visit  Speech:  Clear and Coherent and Normal Rate  Volume:  Normal  Mood:  Anxious and Depressed  Affect:  Congruent  Thought Process:  Coherent, Goal Directed, and Descriptions of Associations: Intact  Orientation:  Full (Time, Place, and Person)  Thought Content: WDL   Suicidal Thoughts:  No  Homicidal Thoughts:  No  Memory:  Immediate;   Good Recent;   Good Remote;   Good  Judgement:  Good  Insight:  Good  Psychomotor Activity:  Normal  Concentration:  Concentration: Good and Attention Span: Good  Recall:  Good  Fund of Knowledge: Good  Language: Good  Akathisia:  No  Handed:  Right  AIMS (if indicated): not done  Assets:  Communication Skills Desire for Improvement Housing Social Support Vocational/Educational  ADL's:  Intact  Cognition: WNL  Sleep:  Fair   Screenings: GAD-7    Flowsheet Row Video Visit from 01/09/2022 in [facility] Video Visit from 11/07/2021 in [facility] Video Visit from 09/12/2021 in [facility] Office Visit from 08/01/2021 in [facility] Counselor from 06/23/2021 in [facility]  Total GAD-7 Score 18 17 13 19 17       PHQ2-9    Flowsheet Row Video Visit from 01/09/2022 in [facility] Most recent reading at 01/09/2022  2:10 PM Office Visit from 12/31/2021 in [place] Most recent reading at 12/31/2021  3:38 PM Office Visit from 11/17/2021 in [place] Most recent reading at 11/17/2021  1:35 PM Office Visit from 11/07/2021 in [place] Most recent reading at 11/07/2021  3:27 PM Video Visit from 11/07/2021 in [facility] Most recent reading at 11/07/2021  1:13 PM  PHQ-2 Total Score 2 1 4 4 4   PHQ-9 Total Score 13 9 15 14 14       Flowsheet Row Video Visit from 01/09/2022 in [facility] ED from 01/06/2022 in [facility] [place] ED from  12/16/2021 in [facility] [place] No Risk Error: Question 6 not populated No Risk        Assessment and Plan:   Jacqueline Gonzalez is a 21 year old female with a past psychiatric history significant for generalized anxiety disorder and major depressive disorder who presents to [facility] via virtual telephone visit for follow-up and medication management.  Patient continues to endorse depression and anxiety.  She reports that she occasionally forgets to take her medications daily.  Patient was encouraged to continue taking 20 mg for 4 days and increase her dosage of Prozac to 40 mg daily.  Patient was agreeable to recommendation.   Collaboration of Care: Collaboration of Care: Medication Management AEB provider managing patient's medications, Primary Care Provider AEB patient being seen by a primary care provider, Psychiatrist AEB patient being followed by a mental health provider, and Other provider involved in patient's care AEB patient is also being seen by OB/GYN  Patient/Guardian was advised Release of Information must be obtained prior to any record release in order to collaborate their care with an outside provider. Patient/Guardian was advised if they have not already done so to contact the registration department  to sign all necessary forms in order for [place] to release information regarding their care.   Consent: Patient/Guardian gives verbal consent for treatment and assignment of benefits for services provided during this visit. Patient/Guardian expressed understanding and agreed to proceed.   1. Moderate episode of recurrent major depressive disorder ([place]) Patient to continue taking fluoxetine 20 mg for four days, then continue taking 40 mg daily for the management of her major depressive disorder  2. Generalized anxiety disorder Patient to continue taking fluoxetine 20 mg for four days, then continue taking 40 mg daily for the management of her generalized anxiety disorder  Patient to follow up in 2 months Provider spent a total of 13 minutes with the patient/reviewing patient's chart  Malachy Mood, PA 01/09/2022, 2:30 PM

## 2022-02-13 ENCOUNTER — Encounter: Payer: Self-pay | Admitting: Gastroenterology

## 2022-03-03 ENCOUNTER — Encounter: Payer: Self-pay | Admitting: Physician Assistant

## 2022-03-03 ENCOUNTER — Ambulatory Visit (INDEPENDENT_AMBULATORY_CARE_PROVIDER_SITE_OTHER): Payer: Medicaid Other | Admitting: Physician Assistant

## 2022-03-03 VITALS — BP 132/82 | HR 95 | Ht 68.0 in | Wt 236.0 lb

## 2022-03-03 DIAGNOSIS — R1031 Right lower quadrant pain: Secondary | ICD-10-CM

## 2022-03-03 DIAGNOSIS — K602 Anal fissure, unspecified: Secondary | ICD-10-CM

## 2022-03-03 DIAGNOSIS — K5909 Other constipation: Secondary | ICD-10-CM

## 2022-03-03 DIAGNOSIS — R1032 Left lower quadrant pain: Secondary | ICD-10-CM

## 2022-03-03 DIAGNOSIS — K625 Hemorrhage of anus and rectum: Secondary | ICD-10-CM | POA: Diagnosis not present

## 2022-03-03 MED ORDER — AMBULATORY NON FORMULARY MEDICATION: 1 refills | Status: DC

## 2022-03-03 MED ORDER — LUBIPROSTONE 8 MCG PO CAPS: 8.0000 ug | ORAL_CAPSULE | Freq: Two times a day (BID) | ORAL | 2 refills | Status: DC

## 2022-03-03 MED ORDER — HYOSCYAMINE SULFATE 0.125 MG SL SUBL: 0.1250 mg | SUBLINGUAL_TABLET | SUBLINGUAL | 2 refills | Status: DC | PRN

## 2022-03-03 NOTE — Progress Notes (Signed)
Chief Complaint: Constipation, Rectal bleeding  HPI:    Jacqueline Gonzalez is a 21 year old African-American female with a past medical history as listed below including IBS and GERD, who was referred to me by Rise Patience, DO for a complaint of constipation and rectal bleeding.      11/20/2021 pelvic ultrasound with submucosal fibroid versus polyp measuring 11 mm and numerous bilateral ovarian follicles with peripheral orientation, correlate for polycystic ovarian disease and trace fluid in the pelvis.    Today, the patient tells me that a couple of years ago she was told that she likely had IBS as she was battling with constipation daily.  Having a bowel movement maybe once every 4 days or so which was very hard to pass.  Most recently about 3 weeks ago she established care with a primary care provider and they started her on Linzess 72 mcg once a day and she has been trying to take this daily but this gives her terrible diarrhea and she still has a lot of abdominal cramping below her bellybutton on both sides.  Tells me that this actually worsened her pain and sometimes she only has mucus and no stool at all and often feels like she needs to have a bowel movement but cannot.  Tells me that the cramping pain seems to come and go maybe 3 to 4 days a week.  It started last night and lasted all night long and was still there this morning when she woke up.  Tells me even if she has a bowel movement during that time the pain does not really get any better.  Also describes seeing some bright red blood on the toilet paper and mixed in with her stool recently and tried to treat this with a suppository for hemorrhoids which did not help.  Does describe some associated rectal pain with passing a stool.    Also discusses that she is currently being worked up for PCOS.  Tells me that due to symptoms above she had a hard time going to school last semester.    Denies fever, chills, weight loss or symptoms that awaken her  from sleep.  Past Medical History:  Diagnosis Date   Chlamydia 11/13/2021   Constipation, chronic    Environmental allergies    Ganglion cyst of dorsum of left wrist    RECURRENT   GERD (gastroesophageal reflux disease)    watches diet   Headache    IBS (irritable bowel syndrome)    Lactose intolerance     Past Surgical History:  Procedure Laterality Date   GANGLION CYST EXCISION Left 04/24/2021   Procedure: left recurrent dorsal carpal ganglion excision;  Surgeon: [name], MD;  Location: [place];  Service: Orthopedics;  Laterality: Left;   TONSILLECTOMY AND ADENOIDECTOMY     AGE 63   WISDOM TOOTH EXTRACTION  03/18/2021   WRIST GANGLION EXCISION Left 02/2020    Current Outpatient Medications  Medication Sig Dispense Refill   cyclobenzaprine (FLEXERIL) 10 MG tablet Take 0.5-1 tablets (5-10 mg total) by mouth 3 (three) times daily as needed for muscle spasms. (Patient not taking: Reported on 12/31/2021) 30 tablet 0   doxycycline (VIBRAMYCIN) 100 MG capsule Take 1 capsule (100 mg total) by mouth 2 (two) times daily. 20 capsule 0   fluconazole (DIFLUCAN) 150 MG tablet Take 1 tablet (150 mg total) by mouth once a week. 2 tablet 0   FLUoxetine (PROZAC) 40 MG capsule Take 1 capsule (40 mg total) by  mouth daily. 90 capsule 3   fluticasone (FLONASE) 50 MCG/ACT nasal spray Place 2 sprays into both nostrils daily. 16 g 6   ibuprofen (ADVIL) 800 MG tablet Take 1 tablet (800 mg total) by mouth 3 (three) times daily with meals as needed. 90 tablet 4   naproxen (NAPROSYN) 500 MG tablet Take 1 tablet (500 mg total) by mouth 2 (two) times daily with a meal. 30 tablet 0   polyethylene glycol powder (GLYCOLAX/MIRALAX) 17 GM/SCOOP powder Take 17 g by mouth in the morning and at bedtime. 850 g 3   tranexamic acid (LYSTEDA) 650 MG TABS tablet Take 2 tablets (1,300 mg total) by mouth 3 (three) times daily. Take during menses for a maximum of five days 30 tablet 2   No current  facility-administered medications for this visit.    Allergies as of 03/03/2022 - Review Complete 01/09/2022  Allergen Reaction Noted   Blue dyes (parenteral) Anaphylaxis 11/14/2016   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/14/2016   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   Shellfish allergy Shortness Of Breath 06/17/2016    Family History  Problem Relation Age of Onset   Asthma Father    Diabetes Maternal Grandmother    Diabetes Paternal Grandmother    Kidney disease Paternal Alanda Slim' disease Paternal Aunt     Social History   Socioeconomic History   Marital status: Single    Spouse name: Not on file   Number of children: Not on file   Years of education: Not on file   Highest education level: Not on file  Occupational History   Not on file  Tobacco Use   Smoking status: Never   Smokeless tobacco: Never  Vaping Use   Vaping Use: Never used  Substance and Sexual Activity   Alcohol use: Never   Drug use: Never   Sexual activity: Yes    Birth control/protection: OCP  Other Topics Concern   Not on file  Social History Narrative   Not on file   Social Determinants of Health   Financial Resource Strain: Not on file  Food Insecurity: Not on file  Transportation Needs: Not on file  Physical Activity: Not on file  Stress: No Stress Concern Present (07/04/2021)   [place]    Feeling of Stress : Only a little  Recent Concern: Stress - Stress Concern Present (06/20/2021)   [place]    Feeling of Stress : To some extent  Social Connections: Not on file  Intimate Partner Violence: Not on file    Review of Systems:    Constitutional: No weight loss, fever or chills Skin: No rash  Cardiovascular: No chest pain Respiratory: No SOB Gastrointestinal: See HPI and otherwise negative Genitourinary: No dysuria   Neurological: No headache, dizziness or syncope Musculoskeletal: No new muscle or joint pain Hematologic: No bruising Psychiatric: No history of depression or anxiety   Physical Exam:  Vital signs: BP 132/82   Pulse 95   Ht 5' 8"  (1.727 m)   Wt 236 lb (107 kg)   BMI 35.88 kg/m    Constitutional:   Pleasant overweight AA female appears to be in NAD, Well developed, Well nourished, alert and cooperative Head:  Normocephalic and atraumatic. Eyes:   PEERL, EOMI. No icterus. Conjunctiva pink. Ears:  Normal auditory acuity. Neck:  Supple Throat: Oral cavity and pharynx without inflammation, swelling or lesion.  Respiratory: Respirations even and unlabored.  Lungs clear to auscultation bilaterally.   No wheezes, crackles, or rhonchi.  Cardiovascular: Normal S1, S2. No MRG. Regular rate and rhythm. No peripheral edema, cyanosis or pallor.  Gastrointestinal:  Soft, nondistended, moderate bilateral lower abdominal TTP. No rebound or guarding. Normal bowel sounds. No appreciable masses or hepatomegaly. Rectal: External: Posterior anal fissure tender to palpation; internal exam not done due to discomfort Msk:  Symmetrical without gross deformities. Without edema, no deformity or joint abnormality.  Neurologic:  Alert and  oriented x4;  grossly normal neurologically.  Skin:   Dry and intact without significant lesions or rashes. Psychiatric: Demonstrates good judgement and reason without abnormal affect or behaviors.  RELEVANT LABS AND IMAGING: CBC    Component Value Date/Time   WBC 7.2 11/17/2021 1357   WBC 6.8 11/01/2021 1540   RBC 3.40 (L) 11/17/2021 1357   RBC 4.10 11/01/2021 1540   HGB 9.7 (L) 11/17/2021 1357   HCT 28.7 (L) 11/17/2021 1357   PLT 307 11/17/2021 1357   MCV 84 11/17/2021 1357   MCH 28.5 11/17/2021 1357   MCH 28.5 11/01/2021 1540   MCHC 33.8 11/17/2021 1357   MCHC 32.6 11/01/2021 1540   RDW 12.8 11/17/2021 1357   LYMPHSABS 2.6 11/01/2021 1540   MONOABS 0.4  11/01/2021 1540   EOSABS 0.1 11/01/2021 1540   BASOSABS 0.0 11/01/2021 1540    CMP     Component Value Date/Time   NA 137 11/01/2021 1540   NA 138 08/28/2020 1600   K 4.0 11/01/2021 1540   CL 109 11/01/2021 1540   CO2 22 11/01/2021 1540   GLUCOSE 108 (H) 11/01/2021 1540   BUN 7 11/01/2021 1540   BUN 6 08/28/2020 1600   CREATININE 0.65 11/01/2021 1540   CALCIUM 8.9 11/01/2021 1540   PROT 7.9 11/01/2021 1540   PROT 7.6 08/28/2020 1600   ALBUMIN 3.9 11/01/2021 1540   ALBUMIN 4.3 08/28/2020 1600   AST 20 11/01/2021 1540   ALT 30 11/01/2021 1540   ALKPHOS 74 11/01/2021 1540   BILITOT 0.4 11/01/2021 1540   BILITOT 0.2 08/28/2020 1600   GFRNONAA >60 11/01/2021 1540   GFRAA 147 08/28/2020 1600    Assessment: 1.  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  Bilateral lower abdominal pain: With above 3.  Anal fissure: Seen at time of exam today, because of rectal pain and bleeding  Plan: 1.  Discussed IBS-C which is likely the cause of her symptoms.  It sounds like the Linzess 72 mcg daily is too strong for her.  We will try a change to Amitiza 8 mcg twice a day with food.  Prescribed #60 with 3 refills.  Did discuss that if this seems too strong she can try decreasing to once daily dosing. 2.  Discussed IBS in detail. 3.  Prescribed Hyoscyamine sulfate 0.125 mg sublingual tabs, 1 tab every 4-6 hours as needed for severe abdominal cramping.  Prescribed #30 with 3 refills.  Did discuss that this can cause some constipation, so would recommend that she limit this to times of pain.  Hopefully if we can get her constipation under control this would be less of a problem. 4.  Prescribed Diltiazem 2% ointment to be applied 3 times daily x6 to 8 weeks to anal fissure. 5.  Discussed RectiCare cream with lidocaine as needed for pain 6.  Discussed sitz bath's for 15 to 20 minutes 2-3 times a day 7.  Patient  to  follow in clinic with me in 2 months or sooner if necessary.  She was assigned to Dr. Candis Schatz today.  Ellouise Newer, PA-C [facility] 03/03/2022, 9:24 AM  Cc: Rise Patience, DO

## 2022-03-03 NOTE — Progress Notes (Signed)
Agree with the assessment and plan as outlined by Jennifer Lemmon, PA-C. ? ?Iman Reinertsen E. Raeann Offner, MD ? ?

## 2022-03-03 NOTE — Patient Instructions (Addendum)
Stop Linzess.  We have sent the following medications to your pharmacy for you to pick up at your convenience: Amitiza 8 mcg twice daily for constipation.  Hyoscyamine 0.125 SL tablet every 4-6 hours as needed for abdominal pain.  We have sent a prescription for Diltiazem 2% gel to [facility] for you. Using your index finger, you should apply a small amount of medication inside the rectum up to your first knuckle/joint three times daily x 6-8 weeks.  [facility]'s information is below: Address: [address] [place],  [address]  [phone]  *Please DO NOT go directly from our office to pick up this medication! Give the pharmacy 1 day to process the prescription as this is compounded and takes time to make.  May use Reticare with Lidocaine as needed to peri-anal area.

## 2022-03-09 ENCOUNTER — Telehealth: Payer: Medicaid Other | Admitting: Physician Assistant

## 2022-03-09 ENCOUNTER — Encounter: Payer: Self-pay | Admitting: Physician Assistant

## 2022-03-09 ENCOUNTER — Encounter (HOSPITAL_COMMUNITY): Payer: Self-pay

## 2022-03-09 ENCOUNTER — Emergency Department (HOSPITAL_BASED_OUTPATIENT_CLINIC_OR_DEPARTMENT_OTHER): Payer: Medicaid Other

## 2022-03-09 ENCOUNTER — Other Ambulatory Visit: Payer: Self-pay

## 2022-03-09 ENCOUNTER — Inpatient Hospital Stay (HOSPITAL_BASED_OUTPATIENT_CLINIC_OR_DEPARTMENT_OTHER)
Admission: EM | Admit: 2022-03-09 | Discharge: 2022-03-16 | DRG: 854 | Disposition: A | Discharge status: Home / Self Care | Payer: Medicaid Other | Attending: Internal Medicine | Admitting: Internal Medicine

## 2022-03-09 DIAGNOSIS — F32A Depression, unspecified: Secondary | ICD-10-CM | POA: Diagnosis present

## 2022-03-09 DIAGNOSIS — K219 Gastro-esophageal reflux disease without esophagitis: Secondary | ICD-10-CM | POA: Diagnosis present

## 2022-03-09 DIAGNOSIS — B952 Enterococcus as the cause of diseases classified elsewhere: Secondary | ICD-10-CM | POA: Diagnosis present

## 2022-03-09 DIAGNOSIS — E876 Hypokalemia: Secondary | ICD-10-CM | POA: Diagnosis present

## 2022-03-09 DIAGNOSIS — Z91013 Allergy to seafood: Secondary | ICD-10-CM

## 2022-03-09 DIAGNOSIS — R188 Other ascites: Secondary | ICD-10-CM | POA: Diagnosis present

## 2022-03-09 DIAGNOSIS — N390 Urinary tract infection, site not specified: Secondary | ICD-10-CM | POA: Diagnosis present

## 2022-03-09 DIAGNOSIS — F411 Generalized anxiety disorder: Secondary | ICD-10-CM | POA: Diagnosis present

## 2022-03-09 DIAGNOSIS — K612 Anorectal abscess: Secondary | ICD-10-CM | POA: Diagnosis present

## 2022-03-09 DIAGNOSIS — E739 Lactose intolerance, unspecified: Secondary | ICD-10-CM | POA: Diagnosis present

## 2022-03-09 DIAGNOSIS — J069 Acute upper respiratory infection, unspecified: Secondary | ICD-10-CM

## 2022-03-09 DIAGNOSIS — R933 Abnormal findings on diagnostic imaging of other parts of digestive tract: Secondary | ICD-10-CM

## 2022-03-09 DIAGNOSIS — K625 Hemorrhage of anus and rectum: Secondary | ICD-10-CM

## 2022-03-09 DIAGNOSIS — K529 Noninfective gastroenteritis and colitis, unspecified: Principal | ICD-10-CM | POA: Diagnosis present

## 2022-03-09 DIAGNOSIS — Z833 Family history of diabetes mellitus: Secondary | ICD-10-CM

## 2022-03-09 DIAGNOSIS — A419 Sepsis, unspecified organism: Principal | ICD-10-CM | POA: Diagnosis present

## 2022-03-09 DIAGNOSIS — A5611 Chlamydial female pelvic inflammatory disease: Secondary | ICD-10-CM | POA: Diagnosis present

## 2022-03-09 DIAGNOSIS — K602 Anal fissure, unspecified: Secondary | ICD-10-CM

## 2022-03-09 DIAGNOSIS — Z20822 Contact with and (suspected) exposure to covid-19: Secondary | ICD-10-CM | POA: Diagnosis present

## 2022-03-09 DIAGNOSIS — K5909 Other constipation: Secondary | ICD-10-CM | POA: Diagnosis present

## 2022-03-09 DIAGNOSIS — Z8041 Family history of malignant neoplasm of ovary: Secondary | ICD-10-CM

## 2022-03-09 DIAGNOSIS — I10 Essential (primary) hypertension: Secondary | ICD-10-CM | POA: Diagnosis present

## 2022-03-09 DIAGNOSIS — Z79899 Other long term (current) drug therapy: Secondary | ICD-10-CM

## 2022-03-09 DIAGNOSIS — K611 Rectal abscess: Secondary | ICD-10-CM

## 2022-03-09 DIAGNOSIS — Z6835 Body mass index (BMI) 35.0-35.9, adult: Secondary | ICD-10-CM

## 2022-03-09 DIAGNOSIS — K6289 Other specified diseases of anus and rectum: Secondary | ICD-10-CM

## 2022-03-09 DIAGNOSIS — K51311 Ulcerative (chronic) rectosigmoiditis with rectal bleeding: Secondary | ICD-10-CM | POA: Diagnosis present

## 2022-03-09 DIAGNOSIS — Z825 Family history of asthma and other chronic lower respiratory diseases: Secondary | ICD-10-CM

## 2022-03-09 DIAGNOSIS — D649 Anemia, unspecified: Secondary | ICD-10-CM | POA: Diagnosis present

## 2022-03-09 LAB — URINALYSIS, ROUTINE W REFLEX MICROSCOPIC
Bilirubin Urine: NEGATIVE
Glucose, UA: NEGATIVE mg/dL
Hgb urine dipstick: NEGATIVE
Ketones, ur: NEGATIVE mg/dL
Nitrite: NEGATIVE
Protein, ur: 100 mg/dL — AB
Specific Gravity, Urine: 1.029 (ref 1.005–1.030)
WBC, UA: >50 WBC/hpf — ABNORMAL HIGH (ref 0–5)
pH: 6.5 (ref 5.0–8.0)

## 2022-03-09 LAB — COMPREHENSIVE METABOLIC PANEL
ALT: 23 U/L (ref 0–44)
AST: 19 U/L (ref 15–41)
Albumin: 3.8 g/dL (ref 3.5–5.0)
Alkaline Phosphatase: 76 U/L (ref 38–126)
Anion gap: 10 (ref 5–15)
BUN: <5 mg/dL — ABNORMAL LOW (ref 6–20)
CO2: 21 mmol/L — ABNORMAL LOW (ref 22–32)
Calcium: 8.8 mg/dL — ABNORMAL LOW (ref 8.9–10.3)
Chloride: 102 mmol/L (ref 98–111)
Creatinine, Ser: 0.88 mg/dL (ref 0.44–1.00)
GFR, Estimated: >60 mL/min (ref >60)
Glucose, Bld: 99 mg/dL (ref 70–99)
Potassium: 3.7 mmol/L (ref 3.5–5.1)
Sodium: 133 mmol/L — ABNORMAL LOW (ref 135–145)
Total Bilirubin: 0.5 mg/dL (ref 0.3–1.2)
Total Protein: 8.1 g/dL (ref 6.5–8.1)

## 2022-03-09 LAB — RESP PANEL BY RT-PCR (FLU A&B, COVID) ARPGX2
Influenza A by PCR: NEGATIVE
Influenza B by PCR: NEGATIVE
SARS Coronavirus 2 by RT PCR: NEGATIVE

## 2022-03-09 LAB — CBC
HCT: 32.2 % — ABNORMAL LOW (ref 36.0–46.0)
Hemoglobin: 10 g/dL — ABNORMAL LOW (ref 12.0–15.0)
MCH: 24.4 pg — ABNORMAL LOW (ref 26.0–34.0)
MCHC: 31.1 g/dL (ref 30.0–36.0)
MCV: 78.7 fL — ABNORMAL LOW (ref 80.0–100.0)
Platelets: 234 10*3/uL (ref 150–400)
RBC: 4.09 MIL/uL (ref 3.87–5.11)
RDW: 14.6 % (ref 11.5–15.5)
WBC: 13.6 10*3/uL — ABNORMAL HIGH (ref 4.0–10.5)
nRBC: 0 % (ref 0.0–0.2)

## 2022-03-09 LAB — PREGNANCY, URINE: Preg Test, Ur: NEGATIVE

## 2022-03-09 LAB — LIPASE, BLOOD: Lipase: <10 U/L — ABNORMAL LOW (ref 11–51)

## 2022-03-09 LAB — WET PREP, GENITAL
Clue Cells Wet Prep HPF POC: NONE SEEN
Sperm: NONE SEEN
Trich, Wet Prep: NONE SEEN
WBC, Wet Prep HPF POC: <10 (ref <10)
Yeast Wet Prep HPF POC: NONE SEEN

## 2022-03-09 MED ORDER — ONDANSETRON HCL 4 MG PO TABS: 4.0000 mg | ORAL_TABLET | Freq: Three times a day (TID) | ORAL | 0 refills | Status: DC | PRN

## 2022-03-09 MED ORDER — ACETAMINOPHEN 325 MG PO TABS
650.0000 mg | ORAL_TABLET | Freq: Once | ORAL | Status: AC | PRN
  Administered 2022-03-09: 650 mg via ORAL
  Filled 2022-03-09: qty 2

## 2022-03-09 MED ORDER — SODIUM CHLORIDE 0.9 % IV SOLN
1.0000 g | Freq: Once | INTRAVENOUS | Status: AC
  Administered 2022-03-09: 1 g via INTRAVENOUS
  Filled 2022-03-09: qty 10

## 2022-03-09 MED ORDER — METRONIDAZOLE 500 MG/100ML IV SOLN
500.0000 mg | Freq: Once | INTRAVENOUS | Status: AC
  Administered 2022-03-10: 500 mg via INTRAVENOUS
  Filled 2022-03-09: qty 100

## 2022-03-09 MED ORDER — IBUPROFEN 600 MG PO TABS: 600.0000 mg | ORAL_TABLET | Freq: Three times a day (TID) | ORAL | 0 refills | Status: DC | PRN

## 2022-03-09 MED ORDER — COVID-19 AT-HOME TEST VI KIT: PACK | 0 refills | Status: DC

## 2022-03-09 MED ORDER — IOHEXOL 300 MG/ML  SOLN
80.0000 mL | Freq: Once | INTRAMUSCULAR | Status: AC | PRN
  Administered 2022-03-09: 80 mL via INTRAVENOUS

## 2022-03-09 NOTE — ED Triage Notes (Signed)
Patient arrives with complaints of abdominal pain, fever (high of 103), and back pain x3 days. Patient reports diarrhea and nausea as well.

## 2022-03-09 NOTE — Patient Instructions (Signed)
Jacqueline Gonzalez, thank you for joining Mar Daring, PA-C for today's virtual visit.  While this provider is not your primary care provider (PCP), if your PCP is located in our provider database this encounter information will be shared with them immediately following your visit.  Consent: (Patient) Jacqueline Gonzalez provided verbal consent for this virtual visit at the beginning of the encounter.  Current Medications:  Current Outpatient Medications:    COVID-19 At-Home Test KIT, Take as directed on package instructions, Disp: 1 kit, Rfl: 0   ibuprofen (ADVIL) 600 MG tablet, Take 1 tablet (600 mg total) by mouth every 8 (eight) hours as needed., Disp: 30 tablet, Rfl: 0   ondansetron (ZOFRAN) 4 MG tablet, Take 1 tablet (4 mg total) by mouth every 8 (eight) hours as needed for nausea or vomiting., Disp: 20 tablet, Rfl: 0   AMBULATORY NON FORMULARY MEDICATION, Medication Name: Diltiazem 2% gel -Using your index finger, apply a small amount of medication inside the rectum up to your first knuckle/joint three times daily x 6-8 weeks., Disp: 30 g, Rfl: 1   cyclobenzaprine (FLEXERIL) 10 MG tablet, Take 0.5-1 tablets (5-10 mg total) by mouth 3 (three) times daily as needed for muscle spasms. (Patient not taking: Reported on 12/31/2021), Disp: 30 tablet, Rfl: 0   doxycycline (VIBRAMYCIN) 100 MG capsule, Take 1 capsule (100 mg total) by mouth 2 (two) times daily., Disp: 20 capsule, Rfl: 0   fluconazole (DIFLUCAN) 150 MG tablet, Take 1 tablet (150 mg total) by mouth once a week. (Patient not taking: Reported on 03/03/2022), Disp: 2 tablet, Rfl: 0   FLUoxetine (PROZAC) 40 MG capsule, Take 1 capsule (40 mg total) by mouth daily. (Patient not taking: Reported on 03/03/2022), Disp: 90 capsule, Rfl: 3   fluticasone (FLONASE) 50 MCG/ACT nasal spray, Place 2 sprays into both nostrils daily., Disp: 16 g, Rfl: 6   hyoscyamine (LEVSIN SL) 0.125 MG SL tablet, Place 1 tablet (0.125 mg total) under the tongue every  4 (four) hours as needed., Disp: 30 tablet, Rfl: 2   lubiprostone (AMITIZA) 8 MCG capsule, Take 1 capsule (8 mcg total) by mouth 2 (two) times daily with a meal., Disp: 60 capsule, Rfl: 2   naproxen (NAPROSYN) 500 MG tablet, Take 1 tablet (500 mg total) by mouth 2 (two) times daily with a meal. (Patient not taking: Reported on 03/03/2022), Disp: 30 tablet, Rfl: 0   polyethylene glycol powder (GLYCOLAX/MIRALAX) 17 GM/SCOOP powder, Take 17 g by mouth in the morning and at bedtime. (Patient not taking: Reported on 03/03/2022), Disp: 850 g, Rfl: 3   tranexamic acid (LYSTEDA) 650 MG TABS tablet, Take 2 tablets (1,300 mg total) by mouth 3 (three) times daily. Take during menses for a maximum of five days (Patient not taking: Reported on 03/03/2022), Disp: 30 tablet, Rfl: 2   Medications ordered in this encounter:  Meds ordered this encounter  Medications   COVID-19 At-Home Test KIT    Sig: Take as directed on package instructions    Dispense:  1 kit    Refill:  0    Order Specific Question:   Supervising Provider    Answer:   MILLER, BRIAN [[id number]]   ondansetron (ZOFRAN) 4 MG tablet    Sig: Take 1 tablet (4 mg total) by mouth every 8 (eight) hours as needed for nausea or vomiting.    Dispense:  20 tablet    Refill:  0    Order Specific Question:   Supervising Provider    Answer:  MILLER, BRIAN [[id number]]   ibuprofen (ADVIL) 600 MG tablet    Sig: Take 1 tablet (600 mg total) by mouth every 8 (eight) hours as needed.    Dispense:  30 tablet    Refill:  0    Order Specific Question:   Supervising Provider    Answer:   Sabra Heck, BRIAN [[id number]]     *If you need refills on other medications prior to your next appointment, please contact your pharmacy*  Follow-Up: Call back or seek an in-person evaluation if the symptoms worsen or if the condition fails to improve as anticipated.   If you have been instructed to have an in-person evaluation today at a local Urgent Care facility, please use the link below.  It will take you to a list of all of our available [facility], including address, phone number and hours of operation. Please do not delay care.  [facility]  If you or a family member do not have a primary care provider, use the link below to schedule a visit and establish care. When you choose a [facility] primary care physician or advanced practice provider, you gain a long-term partner in health. Find a Primary Care Provider  Learn more about [facility] in-office and virtual care options: [facility]

## 2022-03-09 NOTE — Progress Notes (Signed)
  [facility] will assume care on arrival to accepting facility. Until arrival, care as per EDP. However, [facility] available 24/7 for questions and assistance.   Nursing staff please page [facility] and Consults [phone]) as soon as the patient arrives to the hospital.  Kristopher Oppenheim, DO [facility]

## 2022-03-09 NOTE — Progress Notes (Signed)
Virtual Visit Consent   Jacqueline Gonzalez, you are scheduled for a virtual visit with a [facility] provider today. Just as with appointments in the office, your consent must be obtained to participate. Your consent will be active for this visit and any virtual visit you may have with one of our providers in the next 365 days. If you have a MyChart account, a copy of this consent can be sent to you electronically.  As this is a virtual visit, video technology does not allow for your provider to perform a traditional examination. This may limit your provider's ability to fully assess your condition. If your provider identifies any concerns that need to be evaluated in person or the need to arrange testing (such as labs, EKG, etc.), we will make arrangements to do so. Although advances in technology are sophisticated, we cannot ensure that it will always work on either your end or our end. If the connection with a video visit is poor, the visit may have to be switched to a telephone visit. With either a video or telephone visit, we are not always able to ensure that we have a secure connection.  By engaging in this virtual visit, you consent to the provision of healthcare and authorize for your insurance to be billed (if applicable) for the services provided during this visit. Depending on your insurance coverage, you may receive a charge related to this service.  I need to obtain your verbal consent now. Are you willing to proceed with your visit today? Jacqueline Gonzalez has provided verbal consent on 03/09/2022 for a virtual visit (video or telephone). Mar Daring, PA-C  Date: 03/09/2022 2:42 PM  Virtual Visit via Video Note   I, Mar Daring, connected with  Jacqueline Gonzalez  ([id number], [birth year]) on 03/09/22 at  2:30 PM EDT by a video-enabled telemedicine application and verified that I am speaking with the correct person using two identifiers.  Location: Patient: Virtual Visit  Location Patient: Home Provider: Virtual Visit Location Provider: Home Office   I discussed the limitations of evaluation and management by telemedicine and the availability of in person appointments. The patient expressed understanding and agreed to proceed.    History of Present Illness: Jacqueline Gonzalez is a 21 y.o. who identifies as a female who was assigned female at birth, and is being seen today for fever/chills with back and stomach pain.  HPI: Fever  This is a new problem. The current episode started in the past 7 days (2-3 days). The problem occurs constantly. The problem has been gradually worsening. The maximum temperature noted was 102 to 102.9 F (102.7). The temperature was taken using an oral thermometer. Associated symptoms include congestion, diarrhea, headaches, muscle aches and nausea. Pertinent negatives include no chest pain, coughing, ear pain, sore throat, urinary pain or vomiting. She has tried acetaminophen and NSAIDs for the symptoms. The treatment provided mild relief.      Problems:  Patient Active Problem List   Diagnosis Date Noted   Chlamydia 11/18/2021   Menorrhagia with irregular cycle 11/18/2021   Dysmenorrhea 11/18/2021   Anemia 11/17/2021   Lower abdominal pain 11/07/2021   Moderate episode of recurrent major depressive disorder ([place]) 06/23/2021   Low back pain 05/02/2021   Headache 09/03/2020   Nonspecific abdominal pain 08/30/2020   Ganglion cyst of dorsum of left wrist 11/03/2019   Constipation 01/21/2018   Irregular periods 01/21/2018   Generalized anxiety disorder 01/21/2018    Allergies:  Allergies  Allergen Reactions  Blue Dyes (Parenteral) Anaphylaxis   Blueberry [Vaccinium Angustifolium] Anaphylaxis   Raspberry Anaphylaxis   Shellfish Allergy Shortness Of Breath    ALL SHELLFISH   Medications:  Current Outpatient Medications:    COVID-19 At-Home Test KIT, Take as directed on package instructions, Disp: 1 kit, Rfl: 0   ibuprofen  (ADVIL) 600 MG tablet, Take 1 tablet (600 mg total) by mouth every 8 (eight) hours as needed., Disp: 30 tablet, Rfl: 0   ondansetron (ZOFRAN) 4 MG tablet, Take 1 tablet (4 mg total) by mouth every 8 (eight) hours as needed for nausea or vomiting., Disp: 20 tablet, Rfl: 0   AMBULATORY NON FORMULARY MEDICATION, Medication Name: Diltiazem 2% gel -Using your index finger, apply a small amount of medication inside the rectum up to your first knuckle/joint three times daily x 6-8 weeks., Disp: 30 g, Rfl: 1   cyclobenzaprine (FLEXERIL) 10 MG tablet, Take 0.5-1 tablets (5-10 mg total) by mouth 3 (three) times daily as needed for muscle spasms. (Patient not taking: Reported on 12/31/2021), Disp: 30 tablet, Rfl: 0   doxycycline (VIBRAMYCIN) 100 MG capsule, Take 1 capsule (100 mg total) by mouth 2 (two) times daily., Disp: 20 capsule, Rfl: 0   fluconazole (DIFLUCAN) 150 MG tablet, Take 1 tablet (150 mg total) by mouth once a week. (Patient not taking: Reported on 03/03/2022), Disp: 2 tablet, Rfl: 0   FLUoxetine (PROZAC) 40 MG capsule, Take 1 capsule (40 mg total) by mouth daily. (Patient not taking: Reported on 03/03/2022), Disp: 90 capsule, Rfl: 3   fluticasone (FLONASE) 50 MCG/ACT nasal spray, Place 2 sprays into both nostrils daily., Disp: 16 g, Rfl: 6   hyoscyamine (LEVSIN SL) 0.125 MG SL tablet, Place 1 tablet (0.125 mg total) under the tongue every 4 (four) hours as needed., Disp: 30 tablet, Rfl: 2   lubiprostone (AMITIZA) 8 MCG capsule, Take 1 capsule (8 mcg total) by mouth 2 (two) times daily with a meal., Disp: 60 capsule, Rfl: 2   naproxen (NAPROSYN) 500 MG tablet, Take 1 tablet (500 mg total) by mouth 2 (two) times daily with a meal. (Patient not taking: Reported on 03/03/2022), Disp: 30 tablet, Rfl: 0   polyethylene glycol powder (GLYCOLAX/MIRALAX) 17 GM/SCOOP powder, Take 17 g by mouth in the morning and at bedtime. (Patient not taking: Reported on 03/03/2022), Disp: 850 g, Rfl: 3   tranexamic acid  (LYSTEDA) 650 MG TABS tablet, Take 2 tablets (1,300 mg total) by mouth 3 (three) times daily. Take during menses for a maximum of five days (Patient not taking: Reported on 03/03/2022), Disp: 30 tablet, Rfl: 2  Observations/Objective: Patient is well-developed, well-nourished in no acute distress.  Resting comfortably at home.  Head is normocephalic, atraumatic.  No labored breathing.  Speech is clear and coherent with logical content.  Patient is alert and oriented at baseline.    Assessment and Plan: 1. Viral URI - COVID-19 At-Home Test KIT; Take as directed on package instructions  Dispense: 1 kit; Refill: 0 - ondansetron (ZOFRAN) 4 MG tablet; Take 1 tablet (4 mg total) by mouth every 8 (eight) hours as needed for nausea or vomiting.  Dispense: 20 tablet; Refill: 0 - ibuprofen (ADVIL) 600 MG tablet; Take 1 tablet (600 mg total) by mouth every 8 (eight) hours as needed.  Dispense: 30 tablet; Refill: 0  - Was awaiting patient to have covid testing done as I felt symptoms were most consistent with Covid or Influenza. She messaged that the test was not covered so she went  to the Hospital ER for further evaluation and testing.   Follow Up Instructions: I discussed the assessment and treatment plan with the patient. The patient was provided an opportunity to ask questions and all were answered. The patient agreed with the plan and demonstrated an understanding of the instructions.  A copy of instructions were sent to the patient via MyChart unless otherwise noted below.    The patient was advised to call back or seek an in-person evaluation if the symptoms worsen or if the condition fails to improve as anticipated.  Time:  I spent 12 minutes with the patient via telehealth technology discussing the above problems/concerns.    Mar Daring, PA-C

## 2022-03-09 NOTE — ED Provider Notes (Signed)
[facility] Provider Note   CSN: [id number] Arrival date & time: 03/09/22  1614     History  Chief Complaint  Patient presents with   Fever   Back Pain   Abdominal Pain    Jacqueline Gonzalez is a 21 y.o. female.  With a past medical history of chronic constipation, GERD, anemia, anxiety, hypertension, IBS who presents to the ED for evaluation of 3 days of fevers, abdominal pain, flank pain, and body aches. patient states she has been doing her temperature at home and has been consistently 103 F.  Patient has not taken anything for the fevers.  Reports abdominal pain to be periumbilical and without radiation.  Pain is constant.  Abdominal pain feels sharp and shooting.  Describes the pain as moderate in intensity.  Flank pain is bilateral, right more than left.  Describes the pain as an intense ache.  Reports associated nausea, no vomiting.  No chills or night sweats.  Patient is able to keep down liquids, has not tried any food for the past 3 days besides popsicles but is able to keep these down. Reports hematochezia since symptom onset 3 days ago. Denies melena, dysuria, urgency, frequency, vaginal complaints. Reports last sexual contact to be in March. Denies ever engaging in receptive anal sexual activity.   Fever Associated symptoms: nausea   Associated symptoms: no chest pain, no chills and no vomiting   Back Pain Associated symptoms: abdominal pain and fever   Associated symptoms: no chest pain   Abdominal Pain Associated symptoms: fatigue, fever and nausea   Associated symptoms: no chest pain, no chills, no shortness of breath and no vomiting        Home Medications Prior to Admission medications   Medication Sig Start Date End Date Taking? Authorizing Provider  AMBULATORY NON FORMULARY MEDICATION Medication Name: Diltiazem 2% gel -Using your index finger, apply a small amount of medication inside the rectum up to your first knuckle/joint three  times daily x 6-8 weeks. 03/03/22   Levin Erp, PA  COVID-19 At-Home Test KIT Take as directed on package instructions 03/09/22   Mar Daring, PA-C  cyclobenzaprine (FLEXERIL) 10 MG tablet Take 0.5-1 tablets (5-10 mg total) by mouth 3 (three) times daily as needed for muscle spasms. Patient not taking: Reported on 12/31/2021 12/22/21   Mar Daring, PA-C  doxycycline (VIBRAMYCIN) 100 MG capsule Take 1 capsule (100 mg total) by mouth 2 (two) times daily. 01/06/22   Jaynee Eagles, PA-C  fluconazole (DIFLUCAN) 150 MG tablet Take 1 tablet (150 mg total) by mouth once a week. Patient not taking: Reported on 03/03/2022 01/06/22   Jaynee Eagles, PA-C  FLUoxetine (PROZAC) 40 MG capsule Take 1 capsule (40 mg total) by mouth daily. Patient not taking: Reported on 03/03/2022 12/31/21   Shary Key, DO  fluticasone [facility]) 50 MCG/ACT nasal spray Place 2 sprays into both nostrils daily. 10/19/21   Evelina Dun A, FNP  hyoscyamine (LEVSIN SL) 0.125 MG SL tablet Place 1 tablet (0.125 mg total) under the tongue every 4 (four) hours as needed. 03/03/22   Levin Erp, PA  ibuprofen (ADVIL) 600 MG tablet Take 1 tablet (600 mg total) by mouth every 8 (eight) hours as needed. 03/09/22   Mar Daring, PA-C  lubiprostone (AMITIZA) 8 MCG capsule Take 1 capsule (8 mcg total) by mouth 2 (two) times daily with a meal. 03/03/22   Lemmon, Lavone Nian, PA  naproxen (NAPROSYN) 500 MG tablet Take 1  tablet (500 mg total) by mouth 2 (two) times daily with a meal. Patient not taking: Reported on 03/03/2022 01/06/22   Jaynee Eagles, PA-C  ondansetron (ZOFRAN) 4 MG tablet Take 1 tablet (4 mg total) by mouth every 8 (eight) hours as needed for nausea or vomiting. 03/09/22   Mar Daring, PA-C  polyethylene glycol powder (GLYCOLAX/MIRALAX) 17 GM/SCOOP powder Take 17 g by mouth in the morning and at bedtime. Patient not taking: Reported on 03/03/2022 07/23/21   Zola Button, MD  tranexamic acid  (LYSTEDA) 650 MG TABS tablet Take 2 tablets (1,300 mg total) by mouth 3 (three) times daily. Take during menses for a maximum of five days Patient not taking: Reported on 03/03/2022 12/11/21   Osborne Oman, MD      Allergies    Blue dyes (parenteral), Blueberry [vaccinium angustifolium], Raspberry, and Shellfish allergy    Review of Systems   Review of Systems  Constitutional:  Positive for fatigue and fever. Negative for chills.  Respiratory:  Negative for shortness of breath.   Cardiovascular:  Negative for chest pain.  Gastrointestinal:  Positive for abdominal pain and nausea. Negative for vomiting.  Musculoskeletal:  Positive for back pain.  All other systems reviewed and are negative.   Physical Exam Updated Vital Signs BP 103/78 (BP Location: Left Arm)   Pulse 98   Temp 99.4 F (37.4 C) (Oral)   Resp 20   Ht 5' 8"  (1.727 m)   Wt 107 kg   SpO2 98%   BMI 35.88 kg/m  Physical Exam Vitals and nursing note reviewed. Exam conducted with a chaperone present Ware[employer]).  Constitutional:      General: She is not in acute distress.    Appearance: She is well-developed. She is not ill-appearing.  HENT:     Head: Normocephalic and atraumatic.  Eyes:     Conjunctiva/sclera: Conjunctivae normal.  Cardiovascular:     Rate and Rhythm: Normal rate and regular rhythm.     Heart sounds: Normal heart sounds. No murmur heard. Pulmonary:     Effort: Pulmonary effort is normal. No respiratory distress.     Breath sounds: Normal breath sounds. No wheezing.  Abdominal:     General: There is no distension.     Palpations: Abdomen is soft.     Tenderness: There is abdominal tenderness in the right lower quadrant, periumbilical area and left lower quadrant. There is right CVA tenderness, left CVA tenderness and rebound. There is no guarding. Positive signs include Rovsing's sign, McBurney's sign, psoas sign and obturator sign. Negative signs include Murphy's sign.  Genitourinary:     General: Normal vulva.     Labia:        Right: No rash or lesion.        Left: No rash or lesion.      Vagina: Vaginal discharge (thin white) present. No erythema or tenderness.     Cervix: Normal.     Uterus: Not enlarged and not tender.      Adnexa: Right adnexa normal and left adnexa normal.       Right: No mass or tenderness.         Left: No mass or tenderness.       Comments: No cervical motion tenderness Musculoskeletal:        General: No swelling.     Cervical back: Neck supple.  Skin:    General: Skin is warm and dry.     Capillary Refill: Capillary refill takes  less than 2 seconds.  Neurological:     Mental Status: She is alert.  Psychiatric:        Mood and Affect: Mood normal.     ED Results / Procedures / Treatments   Labs (all labs ordered are listed, but only abnormal results are displayed) Labs Reviewed  LIPASE, BLOOD - Abnormal; Notable for the following components:      Result Value   Lipase <10 (*)    All other components within normal limits  COMPREHENSIVE METABOLIC PANEL - Abnormal; Notable for the following components:   Sodium 133 (*)    CO2 21 (*)    BUN <5 (*)    Calcium 8.8 (*)    All other components within normal limits  CBC - Abnormal; Notable for the following components:   WBC 13.6 (*)    Hemoglobin 10.0 (*)    HCT 32.2 (*)    MCV 78.7 (*)    MCH 24.4 (*)    All other components within normal limits  URINALYSIS, ROUTINE W REFLEX MICROSCOPIC - Abnormal; Notable for the following components:   APPearance HAZY (*)    Protein, ur 100 (*)    Leukocytes,Ua LARGE (*)    WBC, UA >50 (*)    Bacteria, UA FEW (*)    All other components within normal limits  RESP PANEL BY RT-PCR (FLU A&B, COVID) ARPGX2  PREGNANCY, URINE    EKG None  Radiology No results found.  Procedures Procedures    Medications Ordered in ED Medications  acetaminophen (TYLENOL) tablet 650 mg (650 mg Oral Given 03/09/22 1749)    ED Course/ Medical Decision  Making/ A&P Clinical Course as of 03/10/22 0002  Mon Mar 09, 2022  1925 Urinalysis, Routine w reflex microscopic Urine, Clean Catch(!) Urinalysis suspicious for UTI [AS]  2210 CT Abdomen Pelvis W Contrast I independently visualized and interpreted imaging which showed generalized edema of the intrapelvic fat with small amount of non  organized presacral fluid. There is wall thickening of the distal  sigmoid colon and rectum. Inflammatory change may be secondary to proctocolitis or pelvic inflammatory disease with reactive wall  thickening.  [AS]  2210 CBC(!) Leukocytosis of 13.6 suggesting infection [AS]  2312 Wet prep, genital Wet prep negative [AS]    Clinical Course User Index [AS] Brittnay Pigman, [place] Folk, PA-C                           Medical Decision Making Amount and/or Complexity of Data Reviewed Labs: ordered. Radiology: ordered.  Risk OTC drugs.  This patient presents to the ED for concern of abdominal pain, flank pain, body aches, this involves an extensive number of treatment options, and is a complaint that carries with it a high risk of complications and morbidity.  The differential diagnosis includes The differential diagnosis of emergent flank pain includes, but is not limited to :Abdominal aortic aneurysm,, Renal artery embolism,Renal vein thrombosis, Aortic dissection, Mesenteric ischemia, Pyelonephritis, Renal infarction, Renal hemorrhage, Nephrolithiasis/ Renal Colic, Bladder tumor,Cystitis, Biliary colic, Pancreatitis Perforated peptic ulcer Appendicitis ,Inguinal Hernia, Diverticulitis, Bowel obstruction Ectopic Pregnancy,PID/TOA,Ovarian cyst, Ovarian torsion Shingles Lower lobe pneumonia, Retroperitoneal hematoma/abscess/tumor, Epidural abscess, Epidural hematoma    Co morbidities that complicate the patient evaluation  Obesity, anxiety, IBS  My initial workup includes abdominal pain labs and CT abdomen pelvis to assess for acute abdominal  emergencies  Additional history obtained from: Nursing notes from this visit. Prior ED visit on 11/06/2021 for pelvic  pain Family mother present and provides a portion of the history  I ordered, reviewed and interpreted labs which include: Lipase, CMP, urine pregnancy, CBC, respiratory panel, urinalysis   I ordered imaging studies including CT abdomen pelvis w contrast I independently visualized and interpreted imaging which showed generalized edema of the intrapelvic fat with small amount of non  organized presacral fluid. There is wall thickening of the distal  sigmoid colon and rectum. Inflammatory change may be secondary to proctocolitis or pelvic inflammatory disease with reactive wall  thickening.  I agree with the radiologist interpretation  Cardiac Monitoring:  The patient was not maintained on a cardiac monitor.  Consultations Obtained:  Hospitalist Dr. Bridgett Larsson. He suggest admission for continued antibiotics for treatment of proctocolitis vs PID. [place] observation  Patient was given PO tylenol during triage for temperature of 103 F. Afebrile throughout the rest of her stay. Workup showed urinalysis consistent with UTI and CT abdomen suspicious for proctocolitis vs PID. Patient has symptoms of hematochezia, fever, leukocytosis, and lower abdominal pain. No symptoms of dyspareunia, adnexal or cervical motion tenderness. Wet prep negative. Patient will be admitted for continued antibiotics and tested for chlamydia, gonorrhea, syphilis and HIV. We will start rocephin and flagyl for UTI and CT abdomen pelvis suspicious for intraabdominal infection.  Patient's case discussed with Dr. Truett Mainland who agrees with plan to admit for further antibiotics  Note: Portions of this report may have been transcribed using voice recognition software. Every effort was made to ensure accuracy; however, inadvertent computerized transcription errors may still be present.          Final Clinical  Impression(s) / ED Diagnoses Final diagnoses:  None    Rx / DC Orders ED Discharge Orders     None         Nehemiah Massed 03/10/22 0004    Cristie Hem, MD 03/10/22 1505

## 2022-03-10 DIAGNOSIS — K219 Gastro-esophageal reflux disease without esophagitis: Secondary | ICD-10-CM | POA: Diagnosis present

## 2022-03-10 DIAGNOSIS — K625 Hemorrhage of anus and rectum: Secondary | ICD-10-CM | POA: Diagnosis not present

## 2022-03-10 DIAGNOSIS — B952 Enterococcus as the cause of diseases classified elsewhere: Secondary | ICD-10-CM | POA: Diagnosis present

## 2022-03-10 DIAGNOSIS — Z20822 Contact with and (suspected) exposure to covid-19: Secondary | ICD-10-CM | POA: Diagnosis present

## 2022-03-10 DIAGNOSIS — K602 Anal fissure, unspecified: Secondary | ICD-10-CM | POA: Diagnosis not present

## 2022-03-10 DIAGNOSIS — K611 Rectal abscess: Secondary | ICD-10-CM | POA: Diagnosis not present

## 2022-03-10 DIAGNOSIS — Z91013 Allergy to seafood: Secondary | ICD-10-CM | POA: Diagnosis not present

## 2022-03-10 DIAGNOSIS — N390 Urinary tract infection, site not specified: Secondary | ICD-10-CM

## 2022-03-10 DIAGNOSIS — E876 Hypokalemia: Secondary | ICD-10-CM | POA: Diagnosis present

## 2022-03-10 DIAGNOSIS — K612 Anorectal abscess: Secondary | ICD-10-CM | POA: Diagnosis present

## 2022-03-10 DIAGNOSIS — F411 Generalized anxiety disorder: Secondary | ICD-10-CM | POA: Diagnosis present

## 2022-03-10 DIAGNOSIS — K5909 Other constipation: Secondary | ICD-10-CM | POA: Diagnosis present

## 2022-03-10 DIAGNOSIS — F32A Depression, unspecified: Secondary | ICD-10-CM | POA: Diagnosis present

## 2022-03-10 DIAGNOSIS — I10 Essential (primary) hypertension: Secondary | ICD-10-CM | POA: Diagnosis present

## 2022-03-10 DIAGNOSIS — K529 Noninfective gastroenteritis and colitis, unspecified: Secondary | ICD-10-CM | POA: Diagnosis present

## 2022-03-10 DIAGNOSIS — A419 Sepsis, unspecified organism: Secondary | ICD-10-CM | POA: Diagnosis present

## 2022-03-10 DIAGNOSIS — K51311 Ulcerative (chronic) rectosigmoiditis with rectal bleeding: Secondary | ICD-10-CM | POA: Diagnosis present

## 2022-03-10 DIAGNOSIS — Z825 Family history of asthma and other chronic lower respiratory diseases: Secondary | ICD-10-CM | POA: Diagnosis not present

## 2022-03-10 DIAGNOSIS — A749 Chlamydial infection, unspecified: Secondary | ICD-10-CM | POA: Diagnosis not present

## 2022-03-10 DIAGNOSIS — E739 Lactose intolerance, unspecified: Secondary | ICD-10-CM | POA: Diagnosis present

## 2022-03-10 DIAGNOSIS — K6289 Other specified diseases of anus and rectum: Secondary | ICD-10-CM | POA: Diagnosis not present

## 2022-03-10 DIAGNOSIS — Z833 Family history of diabetes mellitus: Secondary | ICD-10-CM | POA: Diagnosis not present

## 2022-03-10 DIAGNOSIS — Z6835 Body mass index (BMI) 35.0-35.9, adult: Secondary | ICD-10-CM | POA: Diagnosis not present

## 2022-03-10 DIAGNOSIS — F331 Major depressive disorder, recurrent, moderate: Secondary | ICD-10-CM | POA: Diagnosis not present

## 2022-03-10 DIAGNOSIS — R933 Abnormal findings on diagnostic imaging of other parts of digestive tract: Secondary | ICD-10-CM | POA: Diagnosis not present

## 2022-03-10 DIAGNOSIS — Z79899 Other long term (current) drug therapy: Secondary | ICD-10-CM | POA: Diagnosis not present

## 2022-03-10 DIAGNOSIS — D649 Anemia, unspecified: Secondary | ICD-10-CM | POA: Diagnosis present

## 2022-03-10 DIAGNOSIS — R188 Other ascites: Secondary | ICD-10-CM | POA: Diagnosis present

## 2022-03-10 DIAGNOSIS — A5611 Chlamydial female pelvic inflammatory disease: Secondary | ICD-10-CM | POA: Diagnosis present

## 2022-03-10 DIAGNOSIS — Z8041 Family history of malignant neoplasm of ovary: Secondary | ICD-10-CM | POA: Diagnosis not present

## 2022-03-10 LAB — CBC
HCT: 29.5 % — ABNORMAL LOW (ref 36.0–46.0)
Hemoglobin: 9.3 g/dL — ABNORMAL LOW (ref 12.0–15.0)
MCH: 25.7 pg — ABNORMAL LOW (ref 26.0–34.0)
MCHC: 31.5 g/dL (ref 30.0–36.0)
MCV: 81.5 fL (ref 80.0–100.0)
Platelets: 205 10*3/uL (ref 150–400)
RBC: 3.62 MIL/uL — ABNORMAL LOW (ref 3.87–5.11)
RDW: 14.6 % (ref 11.5–15.5)
WBC: 12.3 10*3/uL — ABNORMAL HIGH (ref 4.0–10.5)
nRBC: 0 % (ref 0.0–0.2)

## 2022-03-10 LAB — LACTIC ACID, PLASMA: Lactic Acid, Venous: 1.2 mmol/L (ref 0.5–1.9)

## 2022-03-10 LAB — HIV ANTIBODY (ROUTINE TESTING W REFLEX): HIV Screen 4th Generation wRfx: NONREACTIVE

## 2022-03-10 MED ORDER — MORPHINE SULFATE (PF) 2 MG/ML IV SOLN
1.0000 mg | INTRAVENOUS | Status: DC | PRN
  Administered 2022-03-10 – 2022-03-13 (×4): 1 mg via INTRAVENOUS
  Filled 2022-03-10 (×5): qty 1

## 2022-03-10 MED ORDER — DICYCLOMINE HCL 10 MG PO CAPS
10.0000 mg | ORAL_CAPSULE | Freq: Three times a day (TID) | ORAL | Status: DC
  Administered 2022-03-10 – 2022-03-16 (×17): 10 mg via ORAL
  Filled 2022-03-10 (×17): qty 1

## 2022-03-10 MED ORDER — ACETAMINOPHEN 325 MG PO TABS
650.0000 mg | ORAL_TABLET | Freq: Four times a day (QID) | ORAL | Status: DC | PRN
Start: 2022-03-10 — End: 2022-03-16
  Administered 2022-03-10 – 2022-03-13 (×6): 650 mg via ORAL
  Filled 2022-03-10 (×7): qty 2

## 2022-03-10 MED ORDER — SODIUM CHLORIDE 0.9 % IV SOLN
2.0000 g | INTRAVENOUS | Status: DC
  Administered 2022-03-10 – 2022-03-11 (×2): 2 g via INTRAVENOUS
  Filled 2022-03-10 (×2): qty 20

## 2022-03-10 MED ORDER — METRONIDAZOLE 500 MG/100ML IV SOLN
500.0000 mg | Freq: Two times a day (BID) | INTRAVENOUS | Status: DC
  Administered 2022-03-10 – 2022-03-12 (×5): 500 mg via INTRAVENOUS
  Filled 2022-03-10 (×5): qty 100

## 2022-03-10 MED ORDER — DILTIAZEM GEL 2 %
Freq: Three times a day (TID) | CUTANEOUS | Status: DC
  Filled 2022-03-10: qty 30

## 2022-03-10 MED ORDER — DILTIAZEM GEL 2 %
Freq: Three times a day (TID) | CUTANEOUS | Status: DC
  Administered 2022-03-11: 1 via TOPICAL
  Filled 2022-03-10: qty 30

## 2022-03-10 MED ORDER — ONDANSETRON HCL 4 MG/2ML IJ SOLN
4.0000 mg | Freq: Four times a day (QID) | INTRAMUSCULAR | Status: DC | PRN
  Administered 2022-03-10 – 2022-03-13 (×3): 4 mg via INTRAVENOUS
  Filled 2022-03-10 (×4): qty 2

## 2022-03-10 MED ORDER — SODIUM CHLORIDE 0.9 % IV SOLN: 1.0000 g | INTRAVENOUS | Status: DC

## 2022-03-10 MED ORDER — SODIUM CHLORIDE 0.9 % IV SOLN: INTRAVENOUS | Status: AC

## 2022-03-10 MED ORDER — LEVALBUTEROL HCL 0.63 MG/3ML IN NEBU
0.6300 mg | INHALATION_SOLUTION | Freq: Once | RESPIRATORY_TRACT | Status: AC
  Administered 2022-03-10: 0.63 mg via RESPIRATORY_TRACT
  Filled 2022-03-10: qty 3

## 2022-03-10 MED ORDER — SODIUM CHLORIDE 0.9 % IV SOLN
100.0000 mg | Freq: Two times a day (BID) | INTRAVENOUS | Status: DC
  Administered 2022-03-10 – 2022-03-12 (×5): 100 mg via INTRAVENOUS
  Filled 2022-03-10 (×6): qty 100

## 2022-03-10 NOTE — H&P (Signed)
History and Physical    Jacqueline Gonzalez RKY:[id number] DOB: 2001-05-27 DOA: 03/09/2022  PCP: Donney Dice, DO  Patient coming from: Home  Chief Complaint: Abdominal pain  HPI: Jacqueline Gonzalez is a 21 y.o. female with medical history significant of anemia, anxiety, chlamydia, IBS-C, anal fissure, depression, GERD, hypertension presented to ED with complaints of fever, abdominal pain, back pain, and hematochezia.  Febrile and tachycardic on arrival to the ED.  Labs showing WBC 13.6, hemoglobin 10.0 (stable), lipase and LFTs normal, UA with signs of infection (large amount of leukocytes and greater than 50 WBCs), urine pregnancy test negative, urine culture pending, COVID and influenza PCR negative, wet prep negative.  Testing for gonorrhea, chlamydia, syphilis, and HIV pending.  Pelvic exam negative for cervical motion tenderness.  CT abdomen pelvis showing findings concerning for proctocolitis versus PID. Patient was given ceftriaxone, metronidazole, and Tylenol.  Patient reports 3-day history of abdominal pain which is generalized but worse in the lower abdomen, buttock pain/rectal pain, low back pain, and fevers.  She denies any urinary symptoms.  Denies any vaginal discharge.  Does report intermittent hematochezia for the past 3 weeks.  Unable to discuss sexual history at this time as patient's mother and friend at bedside.  She has no other complaints.  Review of Systems:  Review of Systems  All other systems reviewed and are negative.   Past Medical History:  Diagnosis Date   Anemia    Anxiety    Chlamydia 11/13/2021   Constipation, chronic    Depression    Environmental allergies    Ganglion cyst of dorsum of left wrist    RECURRENT   GERD (gastroesophageal reflux disease)    watches diet   Headache    Hypertension    IBS (irritable bowel syndrome)    Lactose intolerance     Past Surgical History:  Procedure Laterality Date   GANGLION CYST EXCISION Left 04/24/2021    Procedure: left recurrent dorsal carpal ganglion excision;  Surgeon: [name], MD;  Location: [place];  Service: Orthopedics;  Laterality: Left;   TONSILLECTOMY AND ADENOIDECTOMY     AGE 51   WISDOM TOOTH EXTRACTION  03/18/2021   WRIST GANGLION EXCISION Left 02/2020     reports that she has never smoked. She has never used smokeless tobacco. She reports that she does not drink alcohol and does not use drugs.  Allergies  Allergen Reactions   Blue Dyes (Parenteral) Anaphylaxis   Blueberry [Vaccinium Angustifolium] Anaphylaxis   Raspberry Anaphylaxis   Shellfish Allergy Shortness Of Breath    ALL SHELLFISH    Family History  Problem Relation Age of Onset   Irritable bowel syndrome Mother    Asthma Father    Diabetes Maternal Grandmother    Diabetes Paternal Grandmother    Kidney disease Paternal Alanda Slim' disease Paternal Aunt    Ovarian cancer Maternal Aunt    Stomach cancer Neg Hx    Esophageal cancer Neg Hx    Colon cancer Neg Hx     Prior to Admission medications   Medication Sig Start Date End Date Taking? Authorizing Provider  AMBULATORY NON FORMULARY MEDICATION Medication Name: Diltiazem 2% gel -Using your index finger, apply a small amount of medication inside the rectum up to your first knuckle/joint three times daily x 6-8 weeks. 03/03/22   Levin Erp, PA  COVID-19 At-Home Test KIT Take as directed on package instructions 03/09/22   Mar Daring, PA-C  cyclobenzaprine (FLEXERIL) 10  MG tablet Take 0.5-1 tablets (5-10 mg total) by mouth 3 (three) times daily as needed for muscle spasms. Patient not taking: Reported on 12/31/2021 12/22/21   Mar Daring, PA-C  doxycycline (VIBRAMYCIN) 100 MG capsule Take 1 capsule (100 mg total) by mouth 2 (two) times daily. 01/06/22   Jaynee Eagles, PA-C  fluconazole (DIFLUCAN) 150 MG tablet Take 1 tablet (150 mg total) by mouth once a week. Patient not taking: Reported on 03/03/2022  01/06/22   Jaynee Eagles, PA-C  FLUoxetine (PROZAC) 40 MG capsule Take 1 capsule (40 mg total) by mouth daily. Patient not taking: Reported on 03/03/2022 12/31/21   Shary Key, DO  fluticasone [facility]) 50 MCG/ACT nasal spray Place 2 sprays into both nostrils daily. 10/19/21   Evelina Dun A, FNP  hyoscyamine (LEVSIN SL) 0.125 MG SL tablet Place 1 tablet (0.125 mg total) under the tongue every 4 (four) hours as needed. 03/03/22   Levin Erp, PA  ibuprofen (ADVIL) 600 MG tablet Take 1 tablet (600 mg total) by mouth every 8 (eight) hours as needed. 03/09/22   Mar Daring, PA-C  lubiprostone (AMITIZA) 8 MCG capsule Take 1 capsule (8 mcg total) by mouth 2 (two) times daily with a meal. 03/03/22   Lemmon, Lavone Nian, PA  naproxen (NAPROSYN) 500 MG tablet Take 1 tablet (500 mg total) by mouth 2 (two) times daily with a meal. Patient not taking: Reported on 03/03/2022 01/06/22   Jaynee Eagles, PA-C  ondansetron (ZOFRAN) 4 MG tablet Take 1 tablet (4 mg total) by mouth every 8 (eight) hours as needed for nausea or vomiting. 03/09/22   Mar Daring, PA-C  polyethylene glycol powder (GLYCOLAX/MIRALAX) 17 GM/SCOOP powder Take 17 g by mouth in the morning and at bedtime. Patient not taking: Reported on 03/03/2022 07/23/21   Zola Button, MD  tranexamic acid (LYSTEDA) 650 MG TABS tablet Take 2 tablets (1,300 mg total) by mouth 3 (three) times daily. Take during menses for a maximum of five days Patient not taking: Reported on 03/03/2022 12/11/21   Osborne Oman, MD    Physical Exam: Vitals:   03/10/22 0127 03/10/22 0130 03/10/22 0239 03/10/22 0331  BP: 132/65   (!) 95/45  Pulse: (!) 110   96  Resp: 18   18  Temp: (!) 102.9 F (39.4 C)  (!) 101.1 F (38.4 C) 98.9 F (37.2 C)  TempSrc: Oral Oral  Oral  SpO2: 100%   98%  Weight:      Height:        Physical Exam Vitals reviewed.  Constitutional:      General: She is not in acute distress. HENT:     Head: Normocephalic  and atraumatic.  Eyes:     Extraocular Movements: Extraocular movements intact.  Cardiovascular:     Rate and Rhythm: Normal rate and regular rhythm.     Pulses: Normal pulses.  Pulmonary:     Effort: Pulmonary effort is normal. No respiratory distress.     Breath sounds: Normal breath sounds. No wheezing or rales.  Abdominal:     General: Bowel sounds are normal.     Palpations: Abdomen is soft.     Tenderness: There is no abdominal tenderness. There is no guarding or rebound.     Comments: Mild generalized tenderness to palpation  Musculoskeletal:        General: No swelling or tenderness.     Cervical back: Normal range of motion.  Skin:    General: Skin  is warm and dry.  Neurological:     General: No focal deficit present.     Mental Status: She is alert and oriented to person, place, and time.      Labs on Admission: I have personally reviewed following labs and imaging studies  CBC: Recent Labs  Lab 03/09/22 1745  WBC 13.6*  HGB 10.0*  HCT 32.2*  MCV 78.7*  PLT 947   Basic Metabolic Panel: Recent Labs  Lab 03/09/22 1745  NA 133*  K 3.7  CL 102  CO2 21*  GLUCOSE 99  BUN <5*  CREATININE 0.88  CALCIUM 8.8*   GFR: Estimated Creatinine Clearance: 130.6 mL/min (by C-G formula based on SCr of 0.88 mg/dL). Liver Function Tests: Recent Labs  Lab 03/09/22 1745  AST 19  ALT 23  ALKPHOS 76  BILITOT 0.5  PROT 8.1  ALBUMIN 3.8   Recent Labs  Lab 03/09/22 1745  LIPASE <10*   No results for input(s): "AMMONIA" in the last 168 hours. Coagulation Profile: No results for input(s): "INR", "PROTIME" in the last 168 hours. Cardiac Enzymes: No results for input(s): "CKTOTAL", "CKMB", "CKMBINDEX", "TROPONINI" in the last 168 hours. BNP (last 3 results) No results for input(s): "PROBNP" in the last 8760 hours. HbA1C: No results for input(s): "HGBA1C" in the last 72 hours. CBG: No results for input(s): "GLUCAP" in the last 168 hours. Lipid Profile: No  results for input(s): "CHOL", "HDL", "LDLCALC", "TRIG", "CHOLHDL", "LDLDIRECT" in the last 72 hours. Thyroid Function Tests: No results for input(s): "TSH", "T4TOTAL", "FREET4", "T3FREE", "THYROIDAB" in the last 72 hours. Anemia Panel: No results for input(s): "VITAMINB12", "FOLATE", "FERRITIN", "TIBC", "IRON", "RETICCTPCT" in the last 72 hours. Urine analysis:    Component Value Date/Time   COLORURINE YELLOW 03/09/2022 1745   APPEARANCEUR HAZY (A) 03/09/2022 1745   LABSPEC 1.029 03/09/2022 1745   PHURINE 6.5 03/09/2022 1745   GLUCOSEU NEGATIVE 03/09/2022 1745   HGBUR NEGATIVE 03/09/2022 1745   BILIRUBINUR NEGATIVE 03/09/2022 1745   BILIRUBINUR negative 08/28/2020 1609   KETONESUR NEGATIVE 03/09/2022 1745   PROTEINUR 100 (A) 03/09/2022 1745   UROBILINOGEN 0.2 11/06/2021 1641   NITRITE NEGATIVE 03/09/2022 1745   LEUKOCYTESUR LARGE (A) 03/09/2022 1745    Radiological Exams on Admission: I have personally reviewed images CT Abdomen Pelvis W Contrast  Result Date: 03/09/2022 CLINICAL DATA:  RLQ abdominal pain (Age >= 14y) Abdominal pain, fever and back pain for 3 days. Nausea and diarrhea. EXAM: CT ABDOMEN AND PELVIS WITH CONTRAST TECHNIQUE: Multidetector CT imaging of the abdomen and pelvis was performed using the standard protocol following bolus administration of intravenous contrast. RADIATION DOSE REDUCTION: This exam was performed according to the departmental dose-optimization program which includes automated exposure control, adjustment of the mA and/or kV according to patient size and/or use of iterative reconstruction technique. CONTRAST:  51m OMNIPAQUE IOHEXOL 300 MG/ML  SOLN COMPARISON:  No prior CT.  Pelvic ultrasound 11/20/2021 FINDINGS: Lower chest: Clear lung bases. Hepatobiliary: Mild decreased hepatic density typical of steatosis. No focal hepatic abnormality. Partially distended gallbladder. No calcified gallstone. No biliary dilatation. Pancreas: No ductal dilatation or  inflammation. Spleen: Normal in size without focal abnormality. Adrenals/Urinary Tract: Normal adrenal glands. No hydronephrosis or perinephric edema. Homogeneous renal enhancement. No renal calculi or focal lesion. Urinary bladder is physiologically distended without wall thickening. Stomach/Bowel: Normal appendix, appreciated on coronal series 5, images 57 and 58. Stomach is partially distended. Minimal fecalization of small bowel contents in the pelvis. No small bowel inflammation or obstruction. There  is mild wall thickening involving the distal sigmoid colon and rectum. Vascular/Lymphatic: Normal caliber abdominal aorta. Patent portal and splenic veins. There are multiple prominent retroperitoneal and external iliac lymph nodes, not enlarged by size criteria Reproductive: There is generalized stranding of the right and left pelvic fat. Small amount of presacral non organized fluid. The uterus is anteverted. There is a small amount of fluid in the lower endometrial canal. The ovaries are symmetric in size. No adnexal mass. No evidence of pyo- or hydrosalpinx. Other: Generalized edema of the intrapelvic fat. Small amount of non organized presacral fluid. No drainable or focal fluid collection. No free air. Musculoskeletal: There are no acute or suspicious osseous abnormalities. IMPRESSION: 1. Generalized edema of the intrapelvic fat with small amount of non organized presacral fluid. There is wall thickening of the distal sigmoid colon and rectum. Inflammatory change may be secondary to proctocolitis or pelvic inflammatory disease with reactive wall thickening. 2. Normal appendix. 3. Mild hepatic steatosis. 4. Multiple prominent retroperitoneal and external iliac lymph nodes, not enlarged by size criteria, likely reactive. Electronically Signed   By: Keith Rake M.D.   On: 03/09/2022 22:01    Assessment and Plan  Sepsis secondary to proctocolitis versus PID and possible UTI Febrile and tachycardic on  arrival to the ED.  WBC 13.6.  No lactate checked and no blood cultures drawn in the ED.  UA with signs of infection.  CT abdomen pelvis showing findings concerning for proctocolitis versus PID.  Wet prep negative and pelvic exam negative for cervical motion tenderness.  Sepsis physiology improved. -Continue ceftriaxone and metronidazole, add on doxycycline -IV fluid hydration -Pain management -Testing for gonorrhea, chlamydia, syphilis, and HIV pending -Monitor WBC count -Urine culture pending  Chronic anemia Hemoglobin stable. -Continue to monitor  Anxiety and depression -Continue home medication after pharmacy med rec is done.  DVT prophylaxis: SCDs Code Status: Full Code Family Communication: Patient's mother and friend at bedside. Admission status: It is my clinical opinion that referral for OBSERVATION is reasonable and necessary in this patient based on the above information provided. The aforementioned taken together are felt to place the patient at high risk for further clinical deterioration. However, it is anticipated that the patient may be medically stable for discharge from the hospital within 24 to 48 hours.   Shela Leff MD [facility]  If 7PM-7AM, please contact night-coverage [email or web address]  03/10/2022, 3:50 AM

## 2022-03-10 NOTE — Consult Note (Signed)
Consultation Note   Referring Provider: [facility] PCP: Donney Dice, DO Primary Gastroenterologist: Dustin Flock, MD Reason for consultation: abdominal pain, fever, abdominal CT  Hospital Day: 2  Assessment / Plan   # 21 yo female with chronic constipation but recent development of loose, mucoid stools with blood after starting Linzess. Some of the blood may be from recently diagnosed anal fissure. Symptoms persistent even after stopping linzess a couple of weeks ago.  Now admitted with sepsis, possibly related to UTI vs PID vs proctocolitis seen on CT scan . Blood and urine cultures pending Wet prep negative. Chlamydia pending C-diff / GI panel pending.  If urine negative and no other source of infection is found then she may need a colonoscopy or at least a flexible sigmoidoscopy to evaluate rectosigmoid findings on CT scan  Diltiazem gel for anal fissure ( if on formulary)  # Chronic constipation now with blood, mucus and loose stool. Symptoms started a few weeks ago after she began LInzess. Even after stopping Linzess due to diarrhea she continued to have symptoms. No recent antibiotic use.  She hasn't yet started Amitiza. Hold for now  # Chronic anemia, borderline microcytic. Likely related to menses. Hgb stable at 9.3. Ferritin 39. Normal TIBC, iron sat actually elevated.   # See PMH for additional medical problems   HPI   Jacqueline Gonzalez is a 21 y.o. female with a past medical history significant for IBS, anal fissure, chlamydia, depression, HTH, and  GERD.   See PMH for any additional medical problems.  Jacqueline Gonzalez was seen in our office about a week ago for chronic constipation, rectal bleeding. Linzess caused diarrhea, we changed her to Amitiza. She was having associated lower abdominal pain for which we gave her Hyoscyamine. She was found to have an anal fissure for which we prescribed Diltiazem ointment.  Interval  History:  Patient presented to ED yesterday for evaluation of abdominal pain, fever as high as 103 at home.  In ED she was febrile, tachycardic with WBC of 13K. UA suspicious for infection. Wet prep negative and pelvic exam negative for cervical motion. CT scan shows wall thickening of the distal sigmoid colon and rectum. Inflammatory change may be secondary to proctocolitis or pelvic inflammatory disease with reactive wall thickening. Working diagnosis for admission was sepsis.   Jacqueline Gonzalez gives a long history of constipation. A few weeks ago her PCP started Linzess but it caused lower abdominal pain, terrible diarrhea and then she started passing mucus and blood. Stopped Linzess a couple of weeks ago but symptoms persisted. She hasn't yet started the Amitiza or Diltiazem ointment that we prescribed last week. She complains of rectal pain. Generalized lower abdomina pain is unrelieved with defecations Sounds like she hasn't started Hyoscyamine either. She hasn't had any recent antibiotics. [facility] of IBD is unknown. She has unintentionally lost about 8 pounds over last few weeks.    Previous GI Evaluation    none  Recent Labs and Imaging CT Abdomen Pelvis W Contrast  Result Date: 03/09/2022 CLINICAL DATA:  RLQ abdominal pain (Age >= 14y) Abdominal pain, fever and back pain for 3 days. Nausea and diarrhea. EXAM: CT ABDOMEN AND PELVIS WITH CONTRAST TECHNIQUE: Multidetector CT imaging of the abdomen and pelvis  was performed using the standard protocol following bolus administration of intravenous contrast. RADIATION DOSE REDUCTION: This exam was performed according to the departmental dose-optimization program which includes automated exposure control, adjustment of the mA and/or kV according to patient size and/or use of iterative reconstruction technique. CONTRAST:  31m OMNIPAQUE IOHEXOL 300 MG/ML  SOLN COMPARISON:  No prior CT.  Pelvic ultrasound 11/20/2021 FINDINGS: Lower chest: Clear lung bases.  Hepatobiliary: Mild decreased hepatic density typical of steatosis. No focal hepatic abnormality. Partially distended gallbladder. No calcified gallstone. No biliary dilatation. Pancreas: No ductal dilatation or inflammation. Spleen: Normal in size without focal abnormality. Adrenals/Urinary Tract: Normal adrenal glands. No hydronephrosis or perinephric edema. Homogeneous renal enhancement. No renal calculi or focal lesion. Urinary bladder is physiologically distended without wall thickening. Stomach/Bowel: Normal appendix, appreciated on coronal series 5, images 57 and 58. Stomach is partially distended. Minimal fecalization of small bowel contents in the pelvis. No small bowel inflammation or obstruction. There is mild wall thickening involving the distal sigmoid colon and rectum. Vascular/Lymphatic: Normal caliber abdominal aorta. Patent portal and splenic veins. There are multiple prominent retroperitoneal and external iliac lymph nodes, not enlarged by size criteria Reproductive: There is generalized stranding of the right and left pelvic fat. Small amount of presacral non organized fluid. The uterus is anteverted. There is a small amount of fluid in the lower endometrial canal. The ovaries are symmetric in size. No adnexal mass. No evidence of pyo- or hydrosalpinx. Other: Generalized edema of the intrapelvic fat. Small amount of non organized presacral fluid. No drainable or focal fluid collection. No free air. Musculoskeletal: There are no acute or suspicious osseous abnormalities. IMPRESSION: 1. Generalized edema of the intrapelvic fat with small amount of non organized presacral fluid. There is wall thickening of the distal sigmoid colon and rectum. Inflammatory change may be secondary to proctocolitis or pelvic inflammatory disease with reactive wall thickening. 2. Normal appendix. 3. Mild hepatic steatosis. 4. Multiple prominent retroperitoneal and external iliac lymph nodes, not enlarged by size  criteria, likely reactive. Electronically Signed   By: MKeith RakeM.D.   On: 03/09/2022 22:01    Labs:  Recent Labs    03/09/22 1745 03/10/22 0431  WBC 13.6* 12.3*  HGB 10.0* 9.3*  HCT 32.2* 29.5*  PLT 234 205   Recent Labs    03/09/22 1745  NA 133*  K 3.7  CL 102  CO2 21*  GLUCOSE 99  BUN <5*  CREATININE 0.88  CALCIUM 8.8*   Recent Labs    03/09/22 1745  PROT 8.1  ALBUMIN 3.8  AST 19  ALT 23  ALKPHOS 76  BILITOT 0.5   No results for input(s): "HEPBSAG", "HCVAB", "HEPAIGM", "HEPBIGM" in the last 72 hours. No results for input(s): "LABPROT", "INR" in the last 72 hours.  Past Medical History:  Diagnosis Date   Anemia    Anxiety    Chlamydia 11/13/2021   Constipation, chronic    Depression    Environmental allergies    Ganglion cyst of dorsum of left wrist    RECURRENT   GERD (gastroesophageal reflux disease)    watches diet   Headache    Hypertension    IBS (irritable bowel syndrome)    Lactose intolerance     Past Surgical History:  Procedure Laterality Date   GANGLION CYST EXCISION Left 04/24/2021   Procedure: left recurrent dorsal carpal ganglion excision;  Surgeon: SOrene Desanctis MD;  [facility]  Service: Orthopedics;  Laterality: Left;  TONSILLECTOMY AND ADENOIDECTOMY     AGE 35   WISDOM TOOTH EXTRACTION  03/18/2021   WRIST GANGLION EXCISION Left 02/2020    Family History  Problem Relation Age of Onset   Irritable bowel syndrome Mother    Asthma Father    Diabetes Maternal Grandmother    Diabetes Paternal Grandmother    Kidney disease Paternal Alanda Slim' disease Paternal Aunt    Ovarian cancer Maternal Aunt    Stomach cancer Neg Hx    Esophageal cancer Neg Hx    Colon cancer Neg Hx     Prior to Admission medications   Medication Sig Start Date End Date Taking? Authorizing Provider  AMBULATORY NON FORMULARY MEDICATION Medication Name: Diltiazem 2% gel -Using your index finger, apply a small  amount of medication inside the rectum up to your first knuckle/joint three times daily x 6-8 weeks. 03/03/22   Levin Erp, PA  COVID-19 At-Home Test KIT Take as directed on package instructions 03/09/22   Mar Daring, PA-C  cyclobenzaprine (FLEXERIL) 10 MG tablet Take 0.5-1 tablets (5-10 mg total) by mouth 3 (three) times daily as needed for muscle spasms. Patient not taking: Reported on 12/31/2021 12/22/21   Mar Daring, PA-C  doxycycline (VIBRAMYCIN) 100 MG capsule Take 1 capsule (100 mg total) by mouth 2 (two) times daily. 01/06/22   Jaynee Eagles, PA-C  fluconazole (DIFLUCAN) 150 MG tablet Take 1 tablet (150 mg total) by mouth once a week. Patient not taking: Reported on 03/03/2022 01/06/22   Jaynee Eagles, PA-C  FLUoxetine (PROZAC) 40 MG capsule Take 1 capsule (40 mg total) by mouth daily. Patient not taking: Reported on 03/03/2022 12/31/21   Shary Key, DO  fluticasone [facility]) 50 MCG/ACT nasal spray Place 2 sprays into both nostrils daily. 10/19/21   Evelina Dun A, FNP  hyoscyamine (LEVSIN SL) 0.125 MG SL tablet Place 1 tablet (0.125 mg total) under the tongue every 4 (four) hours as needed. 03/03/22   Levin Erp, PA  ibuprofen (ADVIL) 600 MG tablet Take 1 tablet (600 mg total) by mouth every 8 (eight) hours as needed. 03/09/22   Mar Daring, PA-C  lubiprostone (AMITIZA) 8 MCG capsule Take 1 capsule (8 mcg total) by mouth 2 (two) times daily with a meal. 03/03/22   Lemmon, Lavone Nian, PA  naproxen (NAPROSYN) 500 MG tablet Take 1 tablet (500 mg total) by mouth 2 (two) times daily with a meal. Patient not taking: Reported on 03/03/2022 01/06/22   Jaynee Eagles, PA-C  ondansetron (ZOFRAN) 4 MG tablet Take 1 tablet (4 mg total) by mouth every 8 (eight) hours as needed for nausea or vomiting. 03/09/22   Mar Daring, PA-C  polyethylene glycol powder (GLYCOLAX/MIRALAX) 17 GM/SCOOP powder Take 17 g by mouth in the morning and at bedtime. Patient not  taking: Reported on 03/03/2022 07/23/21   Zola Button, MD  tranexamic acid (LYSTEDA) 650 MG TABS tablet Take 2 tablets (1,300 mg total) by mouth 3 (three) times daily. Take during menses for a maximum of five days Patient not taking: Reported on 03/03/2022 12/11/21   Osborne Oman, MD    Current Facility-Administered Medications  Medication Dose Route Frequency Provider Last Rate Last Admin   0.9 %  sodium chloride infusion   Intravenous Continuous Kathie Dike, MD 125 mL/hr at 03/10/22 0842 New Bag at 03/10/22 3716   acetaminophen (TYLENOL) tablet 650 mg  650 mg Oral Q6H PRN Shela Leff, MD   650 mg  at 03/10/22 1210   cefTRIAXone (ROCEPHIN) 2 g in sodium chloride 0.9 % 100 mL IVPB  2 g Intravenous Q24H Kathie Dike, MD       doxycycline (VIBRAMYCIN) 100 mg in sodium chloride 0.9 % 250 mL IVPB  100 mg Intravenous Q12H Shela Leff, MD 125 mL/hr at 03/10/22 0440 100 mg at 03/10/22 0440   metroNIDAZOLE (FLAGYL) IVPB 500 mg  500 mg Intravenous Q12H Shela Leff, MD 100 mL/hr at 03/10/22 1158 500 mg at 03/10/22 1158   morphine (PF) 2 MG/ML injection 1 mg  1 mg Intravenous Q4H PRN Shela Leff, MD       ondansetron [facility]) injection 4 mg  4 mg Intravenous Q6H PRN Kathie Dike, MD        Allergies as of 03/09/2022 - Review Complete 03/09/2022  Allergen Reaction Noted   Blue dyes (parenteral) Anaphylaxis 11/14/2016   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/14/2016   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   Shellfish allergy Shortness Of Breath 06/17/2016    Social History   Socioeconomic History   Marital status: Single    Spouse name: Not on file   Number of children: 0   Years of education: Not on file   Highest education level: Not on file  Occupational History   Occupation: student  Tobacco Use   Smoking status: Never   Smokeless tobacco: Never  Vaping Use   Vaping Use: Never used  Substance and Sexual Activity   Alcohol use: Never   Drug  use: Never   Sexual activity: Yes    Birth control/protection: OCP  Other Topics Concern   Not on file  Social History Narrative   Not on file   Social Determinants of Health   Financial Resource Strain: Not on file  Food Insecurity: Not on file  Transportation Needs: Not on file  Physical Activity: Not on file  Stress: No Stress Concern Present (07/04/2021)   [place]    Feeling of Stress : Only a little  Recent Concern: Stress - Stress Concern Present (06/20/2021)   [place]    Feeling of Stress : To some extent  Social Connections: Not on file  Intimate Partner Violence: Not on file    Review of Systems: All systems reviewed and negative except where noted in HPI.  Physical Exam: Vital signs in last 24 hours: Temp:  [98.9 F (37.2 C)-103 F (39.4 C)] 102.9 F (39.4 C) (08/22 1157) Pulse Rate:  [90-117] 117 (08/22 1157) Resp:  [16-20] 18 (08/22 1157) BP: (95-132)/(45-80) 118/59 (08/22 0929) SpO2:  [95 %-100 %] 99 % (08/22 1157) Weight:  [096 kg] 107 kg (08/21 1733) Last BM Date : 03/07/22  General:  Alert female in NAD Psych:  Pleasant, cooperative. Normal mood and affect Eyes: Pupils equal, no icterus. Conjunctive pink Ears:  Normal auditory acuity Nose: No deformity, discharge or lesions Neck:  Supple, no masses felt Lungs:  Clear to auscultation.  Heart:  Regular rate, regular rhythm. No lower extremity edema Abdomen:  Soft, nondistended, mild RLQ tendernss,, active bowel sounds, no masses felt Rectal :  Deferred Msk: Symmetrical without gross deformities.  Neurologic:  Alert, oriented, grossly normal neurologically Skin:  Intact without significant lesions.    Intake/Output from previous day: 08/21 0701 - 08/22 0700 In: 395.4 [P.O.:100; I.V.:3.8; IV Piggyback:291.6] Out: -  Intake/Output this shift:  Total I/O In:  240 [P.O.:240] Out: 200 [Urine:200]    Principal  Problem:   Proctocolitis Active Problems:   Generalized anxiety disorder   Chronic anemia   UTI (urinary tract infection)    Tye Savoy, NP-C @  03/10/2022, 12:13 PM

## 2022-03-10 NOTE — Progress Notes (Signed)
  Transition of Care [facility]) Screening Note   Patient Details  Name: Jacqueline Gonzalez Date of Birth: Jan 24, 2001   Transition of Care [facility]) CM/SW Contact:    Lennart Pall, LCSW Phone Number: 03/10/2022, 11:45 AM    Transition of Care Department [facility]) has reviewed patient and no TOC needs have been identified at this time. We will continue to monitor patient advancement through interdisciplinary progression rounds. If new patient transition needs arise, please place a TOC consult.

## 2022-03-10 NOTE — Progress Notes (Signed)
   03/10/22 2134  Assess: if the MEWS score is Yellow or Red  Were vital signs taken at a resting state? Yes  Focused Assessment Change from prior assessment (see assessment flowsheet)  Does the patient meet 2 or more of the SIRS criteria? Yes  Does the patient have a confirmed or suspected source of infection? Yes  Provider and Rapid Response Notified? No  MEWS guidelines implemented *See Row Information* Yes  Treat  MEWS Interventions Administered scheduled meds/treatments  Pain Scale 0-10  Pain Score 3  Take Vital Signs  Increase Vital Sign Frequency  Yellow: Q 2hr X 2 then Q 4hr X 2, if remains yellow, continue Q 4hrs  Escalate  MEWS: Escalate Yellow: discuss with charge nurse/RN and consider discussing with provider and RRT  Notify: Charge Nurse/RN  Name of Charge Nurse/RN Notified Adrianna  Date Charge Nurse/RN Notified 03/10/22  Time Charge Nurse/RN Notified 2136  Notify: Provider  Provider Name/Title Morton Amy  Date Provider Notified 03/10/22  Time Provider Notified 2136  Method of Notification Page  Notification Reason Other (Comment)  Provider response Other (Comment) (give schedule intervention now.)  Date of Provider Response 03/10/22  Time of Provider Response 2137  Document  Progress note created (see row info) Yes

## 2022-03-10 NOTE — Plan of Care (Signed)
  Problem: Activity: Goal: Risk for activity intolerance will decrease Outcome: Progressing   Problem: Education: Goal: Knowledge of General Education information will improve Description: Including pain rating scale, medication(s)/side effects and non-pharmacologic comfort measures Outcome: Progressing

## 2022-03-10 NOTE — Progress Notes (Signed)
EKG resulted: Vent Tachy at 101 otherwise Normal Sinus Rhythm.  She's also getting her Neb treatment.  She appears relaxed, watching TV, laughing.

## 2022-03-10 NOTE — Progress Notes (Signed)
       REMOTE CROSS COVER NOTE  NAME: [name] MRN: [id number] DOB : 2001-01-03    Date of Service   03/10/2022   HPI/Events of Note   Message received from nursing reporting 8/10 chest heaviness and tightness on ambulation. Chest tightness resolved without intervention, dyspnea remained at rest.   Interventions   Plan:  Dyspnea Xopenex x1 ordered  Chest Tightness EKG --> Sinus Tachycardia HR 101 Ddimer --> 1.93. This could be elevated in the setting of infection. However, with patient symptoms of chest heaviness and dyspnea on exertion, dizziness with ambulation in addition to elevated Dimer will rule out PE with CTA. Patient has also been tachycardic in 110s which has correlated with elevated temp and was attributed to infectious process. CTA PE negative Troponin-  25-->15  Fever refractory to Tylenol- Temp 39.1C  Ibuprofen x1 Continue Rocephin, Doxycycline, and Flagyl  Anemia- HGB 8.6 down from 10.0 two days ago Monitor H&H. Transfuse for HGB <7.0       This document was prepared using Dragon voice recognition software and may include unintentional dictation errors.  Neomia Glass DNP, MHA, FNP-BC Nurse Practitioner [facility] Pager [phone]

## 2022-03-10 NOTE — Progress Notes (Signed)
Patient admitted to the hospital earlier this morning by Dr. Marlowe Sax  Currently, she is continued abdominal pain.  Also describes bilateral flank pain.  She admits to having hematochezia.  She has nausea, but has not had vomiting.  She is chronically constipated.  Abdomen is diffusely tender.  She is noted to be febrile and tachycardic.  Blood pressures otherwise stable.  She has been admitted to the hospital with fever, abdominal pain.  CT scan shows proctocolitis versus PID.  UA also indicates possible infection, urine culture in process.  Stool studies have been sent including GI pathogen panel and C. difficile.  GI consultation requested to see if colonoscopy is indicated.  She is currently on intravenous antibiotics.  Blood cultures are in process.  Continue supportive measures with IV fluids, Tylenol for fever.  Further plans as outlined in H&P.  [employer]

## 2022-03-10 NOTE — Progress Notes (Signed)
   03/10/22 0127  Assess: MEWS Score  Temp (!) 102.9 F (39.4 C)  BP 132/65  MAP (mmHg) 84  Pulse Rate (!) 110  Resp 18  SpO2 100 %  O2 Device Room Air  Assess: MEWS Score  MEWS Temp 2  MEWS Systolic 0  MEWS Pulse 1  MEWS RR 0  MEWS LOC 0  MEWS Score 3  MEWS Score Color Yellow  Assess: if the MEWS score is Yellow or Red  Were vital signs taken at a resting state? Yes  Focused Assessment No change from prior assessment  Does the patient meet 2 or more of the SIRS criteria? No  MEWS guidelines implemented *See Row Information* Yes  Treat  MEWS Interventions Other (Comment) (Md notified will give tylenol as ordered)  Pain Scale 0-10  Pain Score 3  Pain Type Acute pain  Pain Location Abdomen  Pain Descriptors / Indicators Dull;Discomfort  Pain Intervention(s) Distraction;Emotional support  Multiple Pain Sites No  Notify: Charge Nurse/RN  Name of Charge Nurse/RN Notified Adriana  Date Charge Nurse/RN Notified 03/10/22  Time Charge Nurse/RN Notified 0130  Notify: Provider  Provider Name/Title Dr Marlowe Sax  Date Provider Notified 03/10/22  Time Provider Notified 0122  Method of Notification Page  Notification Reason Change in status (Yellow MEWS)  Provider response See new orders;Evaluate remotely (Will come up to see pt soon)  Date of Provider Response 03/10/22  Time of Provider Response 0132  Assess: SIRS CRITERIA  SIRS Temperature  1  SIRS Pulse 1  SIRS Respirations  0  SIRS WBC 1  SIRS Score Sum  3

## 2022-03-10 NOTE — ED Notes (Signed)
Report given to [facility] and RN on 3E

## 2022-03-10 NOTE — Plan of Care (Signed)
Problem: Education: Goal: Knowledge of General Education information will improve Description: Including pain rating scale, medication(s)/side effects and non-pharmacologic comfort measures Outcome: Progressing   Problem: Clinical Measurements: Goal: Ability to maintain clinical measurements within normal limits will improve Outcome: Progressing   Problem: Activity: Goal: Risk for activity intolerance will decrease Outcome: [place], RN 03/10/22 8:45 AM

## 2022-03-10 NOTE — Progress Notes (Signed)
Pt c/o chest tightness 8/10 and dizziness  when she got up to go the Bathroom, she stated the chest tightness has subsided now but difficulty breathing.  VS checked now shows Temp 102.4, Pulse 94, 18, BP 102/54  Oxygen saturation 99% on RA.  Will apply [place] oxygen, at 2L  for now for comfort.

## 2022-03-11 ENCOUNTER — Inpatient Hospital Stay (HOSPITAL_COMMUNITY): Payer: Medicaid Other

## 2022-03-11 ENCOUNTER — Ambulatory Visit: Payer: Medicaid Other | Admitting: Gastroenterology

## 2022-03-11 ENCOUNTER — Encounter (HOSPITAL_COMMUNITY): Payer: Self-pay | Admitting: Internal Medicine

## 2022-03-11 DIAGNOSIS — K529 Noninfective gastroenteritis and colitis, unspecified: Secondary | ICD-10-CM | POA: Diagnosis not present

## 2022-03-11 DIAGNOSIS — D649 Anemia, unspecified: Secondary | ICD-10-CM | POA: Diagnosis not present

## 2022-03-11 DIAGNOSIS — K6289 Other specified diseases of anus and rectum: Secondary | ICD-10-CM

## 2022-03-11 DIAGNOSIS — K602 Anal fissure, unspecified: Secondary | ICD-10-CM

## 2022-03-11 DIAGNOSIS — R933 Abnormal findings on diagnostic imaging of other parts of digestive tract: Secondary | ICD-10-CM | POA: Diagnosis not present

## 2022-03-11 DIAGNOSIS — N390 Urinary tract infection, site not specified: Secondary | ICD-10-CM

## 2022-03-11 DIAGNOSIS — F411 Generalized anxiety disorder: Secondary | ICD-10-CM

## 2022-03-11 LAB — CBC
HCT: 28 % — ABNORMAL LOW (ref 36.0–46.0)
Hemoglobin: 8.6 g/dL — ABNORMAL LOW (ref 12.0–15.0)
MCH: 25 pg — ABNORMAL LOW (ref 26.0–34.0)
MCHC: 30.7 g/dL (ref 30.0–36.0)
MCV: 81.4 fL (ref 80.0–100.0)
Platelets: 204 10*3/uL (ref 150–400)
RBC: 3.44 MIL/uL — ABNORMAL LOW (ref 3.87–5.11)
RDW: 15 % (ref 11.5–15.5)
WBC: 11.7 10*3/uL — ABNORMAL HIGH (ref 4.0–10.5)
nRBC: 0 % (ref 0.0–0.2)

## 2022-03-11 LAB — URINE CULTURE

## 2022-03-11 LAB — TROPONIN I (HIGH SENSITIVITY)
Troponin I (High Sensitivity): 25 ng/L — ABNORMAL HIGH (ref <18)
Troponin I (High Sensitivity): 15 ng/L (ref <18)

## 2022-03-11 LAB — COMPREHENSIVE METABOLIC PANEL
ALT: 28 U/L (ref 0–44)
AST: 21 U/L (ref 15–41)
Albumin: 2.7 g/dL — ABNORMAL LOW (ref 3.5–5.0)
Alkaline Phosphatase: 71 U/L (ref 38–126)
Anion gap: 8 (ref 5–15)
BUN: 5 mg/dL — ABNORMAL LOW (ref 6–20)
CO2: 22 mmol/L (ref 22–32)
Calcium: 7.9 mg/dL — ABNORMAL LOW (ref 8.9–10.3)
Chloride: 107 mmol/L (ref 98–111)
Creatinine, Ser: 0.85 mg/dL (ref 0.44–1.00)
GFR, Estimated: >60 mL/min (ref >60)
Glucose, Bld: 95 mg/dL (ref 70–99)
Potassium: 3.1 mmol/L — ABNORMAL LOW (ref 3.5–5.1)
Sodium: 137 mmol/L (ref 135–145)
Total Bilirubin: 0.4 mg/dL (ref 0.3–1.2)
Total Protein: 7 g/dL (ref 6.5–8.1)

## 2022-03-11 LAB — D-DIMER, QUANTITATIVE: D-Dimer, Quant: 1.93 ug/mL-FEU — ABNORMAL HIGH (ref 0.00–0.50)

## 2022-03-11 LAB — PHOSPHORUS: Phosphorus: 2.5 mg/dL (ref 2.5–4.6)

## 2022-03-11 LAB — GC/CHLAMYDIA PROBE AMP (~~LOC~~) NOT AT ARMC
Chlamydia: POSITIVE — AB
Comment: NEGATIVE
Comment: NORMAL
Neisseria Gonorrhea: NEGATIVE

## 2022-03-11 LAB — MAGNESIUM: Magnesium: 1.8 mg/dL (ref 1.7–2.4)

## 2022-03-11 LAB — RPR: RPR Ser Ql: NONREACTIVE

## 2022-03-11 MED ORDER — FLUOXETINE HCL 20 MG PO CAPS
40.0000 mg | ORAL_CAPSULE | Freq: Every day | ORAL | Status: DC
  Administered 2022-03-11 – 2022-03-16 (×6): 40 mg via ORAL
  Filled 2022-03-11 (×6): qty 2

## 2022-03-11 MED ORDER — IOHEXOL 350 MG/ML SOLN
100.0000 mL | Freq: Once | INTRAVENOUS | Status: AC | PRN
  Administered 2022-03-11: 100 mL via INTRAVENOUS

## 2022-03-11 MED ORDER — SODIUM CHLORIDE (PF) 0.9 % IJ SOLN
INTRAMUSCULAR | Status: AC
  Filled 2022-03-11: qty 50

## 2022-03-11 MED ORDER — IBUPROFEN 400 MG PO TABS
400.0000 mg | ORAL_TABLET | Freq: Once | ORAL | Status: AC
  Administered 2022-03-11: 400 mg via ORAL
  Filled 2022-03-11: qty 1

## 2022-03-11 MED ORDER — POTASSIUM CHLORIDE CRYS ER 20 MEQ PO TBCR
40.0000 meq | EXTENDED_RELEASE_TABLET | Freq: Two times a day (BID) | ORAL | Status: AC
Start: 2022-03-11 — End: 2022-03-11
  Administered 2022-03-11 (×2): 40 meq via ORAL
  Filled 2022-03-11 (×2): qty 2

## 2022-03-11 MED ORDER — POLYETHYLENE GLYCOL 3350 17 GM/SCOOP PO POWD
0.5000 | Freq: Once | ORAL | Status: AC
  Administered 2022-03-11: 127.5 g via ORAL
  Filled 2022-03-11: qty 255

## 2022-03-11 NOTE — Progress Notes (Signed)
Daily Progress Note  Hospital Day: 3  Chief Complaint: abdominal pain, fever, abdominal CT        Brief History Selina Tapper is a 21 y.o. female with a pmh not limited to   Assessment / Plan   # 21 yo female with chronic constipation but recent development of loose, mucoid stools with blood after starting Linzess. Some of the blood may be from recently diagnosed anal fissure. Symptoms persistent even after stopping linzess a couple of weeks ago.  Now admitted with sepsis, possibly related to UTI vs PID vs proctocolitis suggested on CT scan . Wet prep negative.  Interval History: WBC has improved >> 11.7.  Blood cultures  negative so far. Urine cultures pending .  GC / Chlamydia pending Receiving rocephin, vibramycin, flagyl  She is no longer having diarrhea and not passed any blood / mucus from rectum in at least three days so C-diff / GI panel not yet collected. Infectious colitis ( at least persistent infection) seems unlikely at this point.  Ischemic colitis seems unlikely given her age and distribution of bowel changes on CT scan. IBD is one possibility.     I am cancelling stool studies. Can reorder if starts having diarrhea. If GYN studies negative and lower abdominal pain persists he may a flexible sigmoidoscopy to evaluate rectosigmoid findings on CT scan  Continue Diltiazem gel for anal fissure    # Chronic anemia, borderline microcytic. Likely related to menses. Hgb stable at 9.3. Ferritin 39. Normal TIBC, iron sat actually elevated    Subjective  Still having generalized lower abdominal pain, worse when she stands. Pain more prominent in LLQ. No diarrhea/ blood or mucus. In fact she hasn't passed anything from rectum in last few days   Objective    Imaging:  CT Angio Chest Pulmonary Embolism (PE) W or WO Contrast  Result Date: 03/11/2022 CLINICAL DATA:  Pulmonary embolism (PE) suspected, positive D-dimer. Tachycardia, fever, dyspnea EXAM: CT ANGIOGRAPHY CHEST  WITH CONTRAST TECHNIQUE: Multidetector CT imaging of the chest was performed using the standard protocol during bolus administration of intravenous contrast. Multiplanar CT image reconstructions and MIPs were obtained to evaluate the vascular anatomy. RADIATION DOSE REDUCTION: This exam was performed according to the departmental dose-optimization program which includes automated exposure control, adjustment of the mA and/or kV according to patient size and/or use of iterative reconstruction technique. CONTRAST:  149m OMNIPAQUE IOHEXOL 350 MG/ML SOLN COMPARISON:  None Available. FINDINGS: Cardiovascular: Adequate opacification of the pulmonary arterial tree. No intraluminal filling defect identified to suggest acute pulmonary embolism. Central pulmonary arteries are of normal caliber. No significant coronary artery calcification. Cardiac size within normal limits. No pericardial effusion. No significant atherosclerotic calcification within the thoracic aorta. No aortic aneurysm. Mediastinum/Nodes: No enlarged mediastinal, hilar, or axillary lymph nodes. Thyroid gland, trachea, and esophagus demonstrate no significant findings. Lungs/Pleura: Lungs are clear. No pleural effusion or pneumothorax. Upper Abdomen: No acute abnormality. Musculoskeletal: No chest wall abnormality. No acute or significant osseous findings. Review of the MIP images confirms the above findings. IMPRESSION: No pulmonary embolism. Normal examination. Electronically Signed   By: AFidela SalisburyM.D.   On: 03/11/2022 04:00   CT Abdomen Pelvis W Contrast  Result Date: 03/09/2022 CLINICAL DATA:  RLQ abdominal pain (Age >= 14y) Abdominal pain, fever and back pain for 3 days. Nausea and diarrhea. EXAM: CT ABDOMEN AND PELVIS WITH CONTRAST TECHNIQUE: Multidetector CT imaging of the abdomen and pelvis was performed using the standard protocol following bolus administration  of intravenous contrast. RADIATION DOSE REDUCTION: This exam was performed  according to the departmental dose-optimization program which includes automated exposure control, adjustment of the mA and/or kV according to patient size and/or use of iterative reconstruction technique. CONTRAST:  24m OMNIPAQUE IOHEXOL 300 MG/ML  SOLN COMPARISON:  No prior CT.  Pelvic ultrasound 11/20/2021 FINDINGS: Lower chest: Clear lung bases. Hepatobiliary: Mild decreased hepatic density typical of steatosis. No focal hepatic abnormality. Partially distended gallbladder. No calcified gallstone. No biliary dilatation. Pancreas: No ductal dilatation or inflammation. Spleen: Normal in size without focal abnormality. Adrenals/Urinary Tract: Normal adrenal glands. No hydronephrosis or perinephric edema. Homogeneous renal enhancement. No renal calculi or focal lesion. Urinary bladder is physiologically distended without wall thickening. Stomach/Bowel: Normal appendix, appreciated on coronal series 5, images 57 and 58. Stomach is partially distended. Minimal fecalization of small bowel contents in the pelvis. No small bowel inflammation or obstruction. There is mild wall thickening involving the distal sigmoid colon and rectum. Vascular/Lymphatic: Normal caliber abdominal aorta. Patent portal and splenic veins. There are multiple prominent retroperitoneal and external iliac lymph nodes, not enlarged by size criteria Reproductive: There is generalized stranding of the right and left pelvic fat. Small amount of presacral non organized fluid. The uterus is anteverted. There is a small amount of fluid in the lower endometrial canal. The ovaries are symmetric in size. No adnexal mass. No evidence of pyo- or hydrosalpinx. Other: Generalized edema of the intrapelvic fat. Small amount of non organized presacral fluid. No drainable or focal fluid collection. No free air. Musculoskeletal: There are no acute or suspicious osseous abnormalities. IMPRESSION: 1. Generalized edema of the intrapelvic fat with small amount of non  organized presacral fluid. There is wall thickening of the distal sigmoid colon and rectum. Inflammatory change may be secondary to proctocolitis or pelvic inflammatory disease with reactive wall thickening. 2. Normal appendix. 3. Mild hepatic steatosis. 4. Multiple prominent retroperitoneal and external iliac lymph nodes, not enlarged by size criteria, likely reactive. Electronically Signed   By: MKeith RakeM.D.   On: 03/09/2022 22:01    Lab Results: Recent Labs    03/09/22 1745 03/10/22 0431 03/11/22 0035  WBC 13.6* 12.3* 11.7*  HGB 10.0* 9.3* 8.6*  HCT 32.2* 29.5* 28.0*  PLT 234 205 204   BMET Recent Labs    03/09/22 1745 03/11/22 0035  NA 133* 137  K 3.7 3.1*  CL 102 107  CO2 21* 22  GLUCOSE 99 95  BUN <5* 5*  CREATININE 0.88 0.85  CALCIUM 8.8* 7.9*   LFT Recent Labs    03/11/22 0035  PROT 7.0  ALBUMIN 2.7*  AST 21  ALT 28  ALKPHOS 71  BILITOT 0.4   PT/INR No results for input(s): "LABPROT", "INR" in the last 72 hours.   Scheduled inpatient medications:   dicyclomine  10 mg Oral TID AC   diltiazem   Topical TID   potassium chloride  40 mEq Oral BID   sodium chloride (PF)       Continuous inpatient infusions:   sodium chloride 125 mL/hr at 03/11/22 0408   cefTRIAXone (ROCEPHIN)  IV 2 g (03/10/22 2153)   doxycycline (VIBRAMYCIN) IV 100 mg (03/11/22 0416)   metronidazole 500 mg (03/10/22 2308)   PRN inpatient medications: acetaminophen, morphine injection, ondansetron (ZOFRAN) IV, sodium chloride (PF)  Vital signs in last 24 hours: Temp:  [98.2 F (36.8 C)-102.9 F (39.4 C)] 98.2 F (36.8 C) (08/23 0629) Pulse Rate:  [92-117] 103 (08/23 0629) Resp:  [18-20]  18 (08/23 0629) BP: (102-125)/(50-65) 111/52 (08/23 0629) SpO2:  [97 %-100 %] 97 % (08/23 0629) Last BM Date : 03/07/22  Intake/Output Summary (Last 24 hours) at 03/11/2022 0911 Last data filed at 03/11/2022 0600 Gross per 24 hour  Intake 4411.64 ml  Output 2350 ml  Net 2061.64 ml     Intake/Output from previous day: 08/22 0701 - 08/23 0700 In: 4411.6 [P.O.:1440; I.V.:2333.5; IV Piggyback:638.2] Out: 2350 [Urine:2350] Intake/Output this shift: No intake/output data recorded.   Physical Exam:  General: Alert female in NAD Heart:  Regular rate and rhythm. No lower extremity edema Pulmonary: Normal respiratory effort Abdomen: Soft, nondistended, mild LLQ tenderness. Normal bowel sounds.  Neurologic: Alert and oriented Psych: Pleasant. Cooperative.    Principal Problem:   Proctocolitis Active Problems:   Generalized anxiety disorder   Chronic anemia   UTI (urinary tract infection)   Rectal bleeding   Anal fissure   Abnormal CT scan, colon     LOS: 1 day   Tye Savoy ,NP 03/11/2022, 9:11 AM

## 2022-03-11 NOTE — Progress Notes (Signed)
Yes, She said she does have a history of anxiety,  panic and Sometimes  her breast being heavy may have caused the tightness/heaviness on her chest.

## 2022-03-11 NOTE — Progress Notes (Signed)
Meant to say patient wants pain medication for pain in her abdomen, lower back, left buttocks and left leg.

## 2022-03-11 NOTE — Progress Notes (Signed)
PROGRESS NOTE    Jacqueline Gonzalez  ERD:[id number] DOB: 2001/07/18 DOA: 03/09/2022 PCP: Donney Dice, DO   Brief Narrative:  HPI per Dr. Shela Leff on 03/10/22 Jacqueline Gonzalez is a 21 y.o. female with medical history significant of anemia, anxiety, chlamydia, IBS-C, anal fissure, depression, GERD, hypertension presented to ED with complaints of fever, abdominal pain, back pain, and hematochezia.  Febrile and tachycardic on arrival to the ED.  Labs showing WBC 13.6, hemoglobin 10.0 (stable), lipase and LFTs normal, UA with signs of infection (large amount of leukocytes and greater than 50 WBCs), urine pregnancy test negative, urine culture pending, COVID and influenza PCR negative, wet prep negative.  Testing for gonorrhea, chlamydia, syphilis, and HIV pending.  Pelvic exam negative for cervical motion tenderness.  CT abdomen pelvis showing findings concerning for proctocolitis versus PID. Patient was given ceftriaxone, metronidazole, and Tylenol.   Patient reports 3-day history of abdominal pain which is generalized but worse in the lower abdomen, buttock pain/rectal pain, low back pain, and fevers.  She denies any urinary symptoms.  Denies any vaginal discharge.  Does report intermittent hematochezia for the past 3 weeks.  Unable to discuss sexual history at this time as patient's mother and friend at bedside.  She has no other complaints.  **Interim History GI was consulted and she continues to have some abdominal discomfort and pain.  Still awaiting some studies.  Pain is not really improving.  Slightly worse in the left quadrant than right.  GI considering flexible sigmoidoscopy if GYN studies are negative.   Assessment and Plan: No notes have been filed under this hospital service. Service: Hospitalist    Sepsis secondary to proctocolitis versus PID and possible UTI -Febrile and tachycardic on arrival to the ED.   -WBC 13.6 and trending down and is now 11.7.   -No lactate checked  and no blood cultures drawn in the ED.   -Diarrhea has resolved and she has not had a bowel movement 4 days for anorexic precautions have been discontinued given that C. difficile GI pathogen panel has not been collected; infectious colitis seems unlikely at this point given her lack of diarrhea but inflammatory bowel disease could be a possibility -GI has canceled her stool studies if she starts having diarrhea again they are recommending reordering -She continues to have bilateral lower abdominal pain so we will get a complete pelvic ultrasound -Per GI if GYN studies are negative and lower abdominal pain persists she may have a flex sigmoidoscopy to evaluate rectosigmoid findings on the CT scan -She is given diltiazem gel for anal fissure -UA done and showed hazy appearance with large leukocytes, negative nitrites, few bacteria, 0-5 RBCs per high-power field, 11-20 squamous epithelial cells, greater than 50 WBCs with urine culture showing multiple species and suggesting recollection -Blood cultures obtained on 03/10/2022 showing no growth to date less than 24 hours -Wet prep done and negative -Continue ceftriaxone and metronidazole, add on doxycycline -RPR was nonreactive and GC screen is still pending -IV fluid hydration with normal saline at 125 MLS per hour -Pain management with ibuprofen 400 mg x 1, morphine IV 1 mg every 4 hours as needed for severe pain -Continuing dicyclomine 10 mg 3 times daily before meals -Continue supportive care and antiemetics with ondansetron 4 mg every 6 hours as needed nausea vomiting -Testing for gonorrhea, chlamydia, syphilis, and HIV -HIV and RPR nonreactive as above and gonorrhea and Chlamydia still pending -Monitor WBC count slowly trending down -Urine culture will need to be recollected  given   Chronic Normocytic Anemia -Hemoglobin/hematocrit went from 10.0/32.2 and trended down to 9.3/29.5 is now 8.6/20.0 today -Check anemia panel in the a.m. -Continue  monitor for signs and symptoms of bleeding; no overt bleeding noted   Anxiety and depression -Continue home medication after pharmacy med rec is done.  Elevated D-dimer with chest discomfort -D-dimer was 1.93 -Given this he underwent a CTA of the chest to evaluate for PE and this was negative and also showed a normal examination  Hypokalemia -Patient's K+ went from 3.7 -> 3.1 -Replete with po KCL 40 mEQ BID x2 -Check Mag Level and add on -Continue to Monitor and Replete as Necessary -Repeat CMP in the AM   Obesity -Complicates overall prognosis and care -Estimated body mass index is 35.88 kg/m as calculated from the following:   Height as of this encounter: 5' 8"  (1.727 m).   Weight as of this encounter: 107 kg.  -Weight Loss and Dietary Counseling given  DVT prophylaxis: SCDs Start: 03/10/22 0408    Code Status: Full Code Family Communication: Discussed with the mother over the telephone while I was in the room.  Disposition Plan:  Level of care: Med-Surg Status is: Inpatient Remains inpatient appropriate because: Continues to have abdominal discomfort and pain and will need GI evaluation and clearance prior to safe discharge disposition   Consultants:  Gastroenterology  Procedures:  None  Antimicrobials:  Anti-infectives (From admission, onward)    Start     Dose/Rate Route Frequency Ordered Stop   03/10/22 2300  cefTRIAXone (ROCEPHIN) 1 g in sodium chloride 0.9 % 100 mL IVPB  Status:  Discontinued        1 g 200 mL/hr over 30 Minutes Intravenous Every 24 hours 03/10/22 0409 03/10/22 1159   03/10/22 2300  cefTRIAXone (ROCEPHIN) 2 g in sodium chloride 0.9 % 100 mL IVPB        2 g 200 mL/hr over 30 Minutes Intravenous Every 24 hours 03/10/22 1159     03/10/22 1100  metroNIDAZOLE (FLAGYL) IVPB 500 mg        500 mg 100 mL/hr over 60 Minutes Intravenous Every 12 hours 03/10/22 0409     03/10/22 0500  doxycycline (VIBRAMYCIN) 100 mg in sodium chloride 0.9 % 250 mL  IVPB        100 mg 125 mL/hr over 120 Minutes Intravenous Every 12 hours 03/10/22 0409     03/09/22 2315  metroNIDAZOLE (FLAGYL) IVPB 500 mg        500 mg 100 mL/hr over 60 Minutes Intravenous  Once 03/09/22 2308 03/10/22 0720   03/09/22 2300  cefTRIAXone (ROCEPHIN) 1 g in sodium chloride 0.9 % 100 mL IVPB        1 g 200 mL/hr over 30 Minutes Intravenous  Once 03/09/22 2253 03/10/22 0029       Subjective: Seen and examined at bedside and she is still having some abdominal discomfort in the lower quadrants slightly worse on the left compared to right.  Did not feel much better.  Had some chest discomfort last night and some shortness of breath however CT scan showed no evidence of PE.  Objective: Vitals:   03/10/22 2354 03/11/22 0100 03/11/22 0326 03/11/22 0629  BP:  118/61 125/65 (!) 111/52  Pulse:  94 92 (!) 103  Resp:  18 18 18   Temp:  100 F (37.8 C) 98.7 F (37.1 C) 98.2 F (36.8 C)  TempSrc:  Oral Oral Oral  SpO2: 98% 100% 100% 97%  Weight:      Height:        Intake/Output Summary (Last 24 hours) at 03/11/2022 0825 Last data filed at 03/11/2022 0600 Gross per 24 hour  Intake 4411.64 ml  Output 2350 ml  Net 2061.64 ml   Filed Weights   03/09/22 1733  Weight: 107 kg   Examination: Physical Exam:  Constitutional: WN/WD obese African-American female currently no acute distress appears slightly uncomfortable  Respiratory: Diminished to auscultation bilaterally with coarse breath sounds, no wheezing, rales, rhonchi or crackles. Normal respiratory effort and patient is not tachypenic. No accessory muscle use.  Not wearing any supplemental oxygen via nasal cannula Cardiovascular: RRR, no murmurs / rubs / gallops. S1 and S2 auscultated. No extremity edema.  Abdomen: Soft, tender to palpate in the lower quadrants slightly worse on the left compared to right.  Distended secondary body habitus. Bowel sounds positive.  GU: Deferred. Musculoskeletal: No clubbing / cyanosis of  digits/nails. No joint deformity upper and lower extremities. Skin: No rashes, lesions, ulcers on limited skin evaluation. No induration; Warm and dry.  Neurologic: CN 2-12 grossly intact with no focal deficits. Romberg sign and cerebellar reflexes not assessed.  Psychiatric: Normal judgment and insight. Alert and oriented x 3. Normal mood and appropriate affect.   Data Reviewed: I have personally reviewed following labs and imaging studies  CBC: Recent Labs  Lab 03/09/22 1745 03/10/22 0431 03/11/22 0035  WBC 13.6* 12.3* 11.7*  HGB 10.0* 9.3* 8.6*  HCT 32.2* 29.5* 28.0*  MCV 78.7* 81.5 81.4  PLT 234 205 299   Basic Metabolic Panel: Recent Labs  Lab 03/09/22 1745 03/11/22 0035  NA 133* 137  K 3.7 3.1*  CL 102 107  CO2 21* 22  GLUCOSE 99 95  BUN <5* 5*  CREATININE 0.88 0.85  CALCIUM 8.8* 7.9*   GFR: Estimated Creatinine Clearance: 135.2 mL/min (by C-G formula based on SCr of 0.85 mg/dL). Liver Function Tests: Recent Labs  Lab 03/09/22 1745 03/11/22 0035  AST 19 21  ALT 23 28  ALKPHOS 76 71  BILITOT 0.5 0.4  PROT 8.1 7.0  ALBUMIN 3.8 2.7*   Recent Labs  Lab 03/09/22 1745  LIPASE <10*   No results for input(s): "AMMONIA" in the last 168 hours. Coagulation Profile: No results for input(s): "INR", "PROTIME" in the last 168 hours. Cardiac Enzymes: No results for input(s): "CKTOTAL", "CKMB", "CKMBINDEX", "TROPONINI" in the last 168 hours. BNP (last 3 results) No results for input(s): "PROBNP" in the last 8760 hours. HbA1C: No results for input(s): "HGBA1C" in the last 72 hours. CBG: No results for input(s): "GLUCAP" in the last 168 hours. Lipid Profile: No results for input(s): "CHOL", "HDL", "LDLCALC", "TRIG", "CHOLHDL", "LDLDIRECT" in the last 72 hours. Thyroid Function Tests: No results for input(s): "TSH", "T4TOTAL", "FREET4", "T3FREE", "THYROIDAB" in the last 72 hours. Anemia Panel: No results for input(s): "VITAMINB12", "FOLATE", "FERRITIN", "TIBC",  "IRON", "RETICCTPCT" in the last 72 hours. Sepsis Labs: Recent Labs  Lab 03/10/22 1208  LATICACIDVEN 1.2    Recent Results (from the past 240 hour(s))  Resp Panel by RT-PCR (Flu A&B, Covid) Anterior Nasal Swab     Status: None   Collection Time: 03/09/22  5:45 PM   Specimen: Anterior Nasal Swab  Result Value Ref Range Status   SARS Coronavirus 2 by RT PCR NEGATIVE NEGATIVE Final    Comment: (NOTE) SARS-CoV-2 target nucleic acids are NOT DETECTED.  The SARS-CoV-2 RNA is generally detectable in upper respiratory specimens during the acute phase of  infection. The lowest concentration of SARS-CoV-2 viral copies this assay can detect is 138 copies/mL. A negative result does not preclude SARS-Cov-2 infection and should not be used as the sole basis for treatment or other patient management decisions. A negative result may occur with  improper specimen collection/handling, submission of specimen other than nasopharyngeal swab, presence of viral mutation(s) within the areas targeted by this assay, and inadequate number of viral copies(<138 copies/mL). A negative result must be combined with clinical observations, patient history, and epidemiological information. The expected result is Negative.  Fact Sheet for Patients:  EntrepreneurPulse.com.au  Fact Sheet for Healthcare Providers:  IncredibleEmployment.be  This test is no t yet approved or cleared by the [place] FDA and  has been authorized for detection and/or diagnosis of SARS-CoV-2 by FDA under an Emergency Use Authorization (EUA). This EUA will remain  in effect (meaning this test can be used) for the duration of the COVID-19 declaration under Section 564(b)(1) of the Act, 21 U.S.C.section 360bbb-3(b)(1), unless the authorization is terminated  or revoked sooner.       Influenza A by PCR NEGATIVE NEGATIVE Final   Influenza B by PCR NEGATIVE NEGATIVE Final    Comment: (NOTE) The  Xpert Xpress SARS-CoV-2/FLU/RSV plus assay is intended as an aid in the diagnosis of influenza from Nasopharyngeal swab specimens and should not be used as a sole basis for treatment. Nasal washings and aspirates are unacceptable for Xpert Xpress SARS-CoV-2/FLU/RSV testing.  Fact Sheet for Patients: EntrepreneurPulse.com.au  Fact Sheet for Healthcare Providers: IncredibleEmployment.be  This test is not yet approved or cleared by the [place] FDA and has been authorized for detection and/or diagnosis of SARS-CoV-2 by FDA under an Emergency Use Authorization (EUA). This EUA will remain in effect (meaning this test can be used) for the duration of the COVID-19 declaration under Section 564(b)(1) of the Act, 21 U.S.C. section 360bbb-3(b)(1), unless the authorization is terminated or revoked.  Performed at [facility], [address] [place], [place] [address]   Urine Culture     Status: None (Preliminary result)   Collection Time: 03/09/22 10:37 PM   Specimen: Urine, Clean Catch  Result Value Ref Range Status   Specimen Description   Final    URINE, CLEAN CATCH Performed at [facility], [address] [place], [place] [address]    Special Requests   Final    NONE Performed at [facility], [address], [place], [place] [address]    Culture   Final    CULTURE REINCUBATED FOR BETTER GROWTH Performed at [facility], [place] [address] [place], [place] [address]    Report Status PENDING  Incomplete  Wet prep, genital     Status: None   Collection Time: 03/09/22 10:37 PM   Specimen: PATH Cytology Cervicovaginal Ancillary Only  Result Value Ref Range Status   Yeast Wet Prep HPF POC NONE SEEN NONE SEEN Final   Trich, Wet Prep NONE SEEN NONE SEEN Final   Clue Cells Wet Prep HPF POC NONE SEEN NONE SEEN Final   WBC, Wet Prep HPF POC <10 <10 Final   Sperm NONE SEEN  Final     Comment: Performed at [facility], [address], [place], [place] [address]  Culture, blood (Routine X 2) w Reflex to ID Panel     Status: None (Preliminary result)   Collection Time: 03/10/22 12:07 PM   Specimen: BLOOD  Result Value Ref Range Status   Specimen Description  Final    BLOOD LEFT ANTECUBITAL Performed at [facility] [address], [place], [place] [address]    Special Requests   Final    BOTTLES DRAWN AEROBIC AND ANAEROBIC Blood Culture results may not be optimal due to an inadequate volume of blood received in culture bottles Performed at [facility] [address], [place], [place] [address]    Culture   Final    NO GROWTH < 24 HOURS Performed at [facility] [address], [place], [place] [address]    Report Status PENDING  Incomplete     Radiology Studies: CT Angio Chest Pulmonary Embolism (PE) W or WO Contrast  Result Date: 03/11/2022 CLINICAL DATA:  Pulmonary embolism (PE) suspected, positive D-dimer. Tachycardia, fever, dyspnea EXAM: CT ANGIOGRAPHY CHEST WITH CONTRAST TECHNIQUE: Multidetector CT imaging of the chest was performed using the standard protocol during bolus administration of intravenous contrast. Multiplanar CT image reconstructions and MIPs were obtained to evaluate the vascular anatomy. RADIATION DOSE REDUCTION: This exam was performed according to the departmental dose-optimization program which includes automated exposure control, adjustment of the mA and/or kV according to patient size and/or use of iterative reconstruction technique. CONTRAST:  151m OMNIPAQUE IOHEXOL 350 MG/ML SOLN COMPARISON:  None Available. FINDINGS: Cardiovascular: Adequate opacification of the pulmonary arterial tree. No intraluminal filling defect identified to suggest acute pulmonary embolism. Central pulmonary arteries are of normal caliber. No significant coronary artery calcification. Cardiac size  within normal limits. No pericardial effusion. No significant atherosclerotic calcification within the thoracic aorta. No aortic aneurysm. Mediastinum/Nodes: No enlarged mediastinal, hilar, or axillary lymph nodes. Thyroid gland, trachea, and esophagus demonstrate no significant findings. Lungs/Pleura: Lungs are clear. No pleural effusion or pneumothorax. Upper Abdomen: No acute abnormality. Musculoskeletal: No chest wall abnormality. No acute or significant osseous findings. Review of the MIP images confirms the above findings. IMPRESSION: No pulmonary embolism. Normal examination. Electronically Signed   By: AFidela SalisburyM.D.   On: 03/11/2022 04:00   CT Abdomen Pelvis W Contrast  Result Date: 03/09/2022 CLINICAL DATA:  RLQ abdominal pain (Age >= 14y) Abdominal pain, fever and back pain for 3 days. Nausea and diarrhea. EXAM: CT ABDOMEN AND PELVIS WITH CONTRAST TECHNIQUE: Multidetector CT imaging of the abdomen and pelvis was performed using the standard protocol following bolus administration of intravenous contrast. RADIATION DOSE REDUCTION: This exam was performed according to the departmental dose-optimization program which includes automated exposure control, adjustment of the mA and/or kV according to patient size and/or use of iterative reconstruction technique. CONTRAST:  887mOMNIPAQUE IOHEXOL 300 MG/ML  SOLN COMPARISON:  No prior CT.  Pelvic ultrasound 11/20/2021 FINDINGS: Lower chest: Clear lung bases. Hepatobiliary: Mild decreased hepatic density typical of steatosis. No focal hepatic abnormality. Partially distended gallbladder. No calcified gallstone. No biliary dilatation. Pancreas: No ductal dilatation or inflammation. Spleen: Normal in size without focal abnormality. Adrenals/Urinary Tract: Normal adrenal glands. No hydronephrosis or perinephric edema. Homogeneous renal enhancement. No renal calculi or focal lesion. Urinary bladder is physiologically distended without wall thickening.  Stomach/Bowel: Normal appendix, appreciated on coronal series 5, images 57 and 58. Stomach is partially distended. Minimal fecalization of small bowel contents in the pelvis. No small bowel inflammation or obstruction. There is mild wall thickening involving the distal sigmoid colon and rectum. Vascular/Lymphatic: Normal caliber abdominal aorta. Patent portal and splenic veins. There are multiple prominent retroperitoneal and external iliac lymph nodes, not enlarged by size criteria Reproductive: There is generalized stranding of the right and left  pelvic fat. Small amount of presacral non organized fluid. The uterus is anteverted. There is a small amount of fluid in the lower endometrial canal. The ovaries are symmetric in size. No adnexal mass. No evidence of pyo- or hydrosalpinx. Other: Generalized edema of the intrapelvic fat. Small amount of non organized presacral fluid. No drainable or focal fluid collection. No free air. Musculoskeletal: There are no acute or suspicious osseous abnormalities. IMPRESSION: 1. Generalized edema of the intrapelvic fat with small amount of non organized presacral fluid. There is wall thickening of the distal sigmoid colon and rectum. Inflammatory change may be secondary to proctocolitis or pelvic inflammatory disease with reactive wall thickening. 2. Normal appendix. 3. Mild hepatic steatosis. 4. Multiple prominent retroperitoneal and external iliac lymph nodes, not enlarged by size criteria, likely reactive. Electronically Signed   By: Keith Rake M.D.   On: 03/09/2022 22:01    Scheduled Meds:  dicyclomine  10 mg Oral TID AC   diltiazem   Topical TID   potassium chloride  40 mEq Oral BID   sodium chloride (PF)       Continuous Infusions:  sodium chloride 125 mL/hr at 03/11/22 0408   cefTRIAXone (ROCEPHIN)  IV 2 g (03/10/22 2153)   doxycycline (VIBRAMYCIN) IV 100 mg (03/11/22 0416)   metronidazole 500 mg (03/10/22 2308)    LOS: 1 day   Raiford Noble, DO [facility] Available via Epic secure chat 7am-7pm After these hours, please refer to coverage provider listed on [email or web address] 03/11/2022, 8:25 AM

## 2022-03-11 NOTE — Progress Notes (Signed)
Patient's temperature was 101.9. Patient received tylenol, and Dr. Alfredia Ferguson was notified.

## 2022-03-12 DIAGNOSIS — D649 Anemia, unspecified: Secondary | ICD-10-CM | POA: Diagnosis not present

## 2022-03-12 DIAGNOSIS — K602 Anal fissure, unspecified: Secondary | ICD-10-CM | POA: Diagnosis not present

## 2022-03-12 DIAGNOSIS — R933 Abnormal findings on diagnostic imaging of other parts of digestive tract: Secondary | ICD-10-CM | POA: Diagnosis not present

## 2022-03-12 DIAGNOSIS — K625 Hemorrhage of anus and rectum: Secondary | ICD-10-CM

## 2022-03-12 DIAGNOSIS — A749 Chlamydial infection, unspecified: Secondary | ICD-10-CM

## 2022-03-12 DIAGNOSIS — A5611 Chlamydial female pelvic inflammatory disease: Secondary | ICD-10-CM

## 2022-03-12 DIAGNOSIS — K6289 Other specified diseases of anus and rectum: Secondary | ICD-10-CM

## 2022-03-12 DIAGNOSIS — K529 Noninfective gastroenteritis and colitis, unspecified: Secondary | ICD-10-CM | POA: Diagnosis not present

## 2022-03-12 LAB — COMPREHENSIVE METABOLIC PANEL
ALT: 21 U/L (ref 0–44)
AST: 17 U/L (ref 15–41)
Albumin: 2.8 g/dL — ABNORMAL LOW (ref 3.5–5.0)
Alkaline Phosphatase: 70 U/L (ref 38–126)
Anion gap: 8 (ref 5–15)
BUN: 5 mg/dL — ABNORMAL LOW (ref 6–20)
CO2: 20 mmol/L — ABNORMAL LOW (ref 22–32)
Calcium: 8.2 mg/dL — ABNORMAL LOW (ref 8.9–10.3)
Chloride: 111 mmol/L (ref 98–111)
Creatinine, Ser: 0.77 mg/dL (ref 0.44–1.00)
GFR, Estimated: >60 mL/min (ref >60)
Glucose, Bld: 100 mg/dL — ABNORMAL HIGH (ref 70–99)
Potassium: 3.8 mmol/L (ref 3.5–5.1)
Sodium: 139 mmol/L (ref 135–145)
Total Bilirubin: 0.4 mg/dL (ref 0.3–1.2)
Total Protein: 6.7 g/dL (ref 6.5–8.1)

## 2022-03-12 LAB — CBC WITH DIFFERENTIAL/PLATELET
Abs Immature Granulocytes: 0.09 10*3/uL — ABNORMAL HIGH (ref 0.00–0.07)
Basophils Absolute: 0 10*3/uL (ref 0.0–0.1)
Basophils Relative: 0 %
Eosinophils Absolute: 0.2 10*3/uL (ref 0.0–0.5)
Eosinophils Relative: 2 %
HCT: 28.1 % — ABNORMAL LOW (ref 36.0–46.0)
Hemoglobin: 8.5 g/dL — ABNORMAL LOW (ref 12.0–15.0)
Immature Granulocytes: 1 %
Lymphocytes Relative: 19 %
Lymphs Abs: 1.7 10*3/uL (ref 0.7–4.0)
MCH: 25 pg — ABNORMAL LOW (ref 26.0–34.0)
MCHC: 30.2 g/dL (ref 30.0–36.0)
MCV: 82.6 fL (ref 80.0–100.0)
Monocytes Absolute: 1 10*3/uL (ref 0.1–1.0)
Monocytes Relative: 12 %
Neutro Abs: 5.8 10*3/uL (ref 1.7–7.7)
Neutrophils Relative %: 66 %
Platelets: 253 10*3/uL (ref 150–400)
RBC: 3.4 MIL/uL — ABNORMAL LOW (ref 3.87–5.11)
RDW: 15.7 % — ABNORMAL HIGH (ref 11.5–15.5)
WBC: 8.7 10*3/uL (ref 4.0–10.5)
nRBC: 0 % (ref 0.0–0.2)

## 2022-03-12 LAB — PHOSPHORUS: Phosphorus: 2.7 mg/dL (ref 2.5–4.6)

## 2022-03-12 LAB — MAGNESIUM: Magnesium: 1.9 mg/dL (ref 1.7–2.4)

## 2022-03-12 MED ORDER — DOXYCYCLINE HYCLATE 100 MG PO TABS
100.0000 mg | ORAL_TABLET | Freq: Two times a day (BID) | ORAL | Status: DC
  Administered 2022-03-12 – 2022-03-16 (×8): 100 mg via ORAL
  Filled 2022-03-12 (×8): qty 1

## 2022-03-12 NOTE — Progress Notes (Signed)
Patient  c/o severe Headache to left side of face and forehead, Says she's not sure if it's migraine, also had  vomited 100c of emesis cc.  tylenol and Zofran givien.  Pt now sleeping.  Patient had been drinking Mlirlax to help  relief constipation.

## 2022-03-12 NOTE — Progress Notes (Signed)
PROGRESS NOTE    Jacqueline Gonzalez  PQZ:[id number] DOB: 05-23-2001 DOA: 03/09/2022 PCP: Donney Dice, DO   Brief Narrative:  HPI per Dr. Shela Leff on 03/10/22 Jacqueline Gonzalez is a 21 y.o. female with medical history significant of anemia, anxiety, chlamydia, IBS-C, anal fissure, depression, GERD, hypertension presented to ED with complaints of fever, abdominal pain, back pain, and hematochezia.  Febrile and tachycardic on arrival to the ED.  Labs showing WBC 13.6, hemoglobin 10.0 (stable), lipase and LFTs normal, UA with signs of infection (large amount of leukocytes and greater than 50 WBCs), urine pregnancy test negative, urine culture pending, COVID and influenza PCR negative, wet prep negative.  Testing for gonorrhea, chlamydia, syphilis, and HIV pending.  Pelvic exam negative for cervical motion tenderness.  CT abdomen pelvis showing findings concerning for proctocolitis versus PID. Patient was given ceftriaxone, metronidazole, and Tylenol.   Patient reports 3-day history of abdominal pain which is generalized but worse in the lower abdomen, buttock pain/rectal pain, low back pain, and fevers.  She denies any urinary symptoms.  Denies any vaginal discharge.  Does report intermittent hematochezia for the past 3 weeks.  Unable to discuss sexual history at this time as patient's mother and friend at bedside.  She has no other complaints.   **Interim History GI was consulted and she continues to have some abdominal discomfort and pain.  Still awaiting some studies.  Pain is not really improving.  Slightly worse in the left quadrant than right.  GI considering flexible sigmoidoscopy if GYN studies are negative however she tested positive for chlamydia  Assessment and Plan:  Sepsis secondary to proctocolitis versus PID and possible UTI; likely she has chlamydia PID and chlamydia proctitis -Febrile and tachycardic on arrival to the ED.   -WBC 13.6 and trending down and is now 8.7.   -No  lactate checked and no blood cultures drawn in the ED.   -Diarrhea has resolved and she has not had a bowel movement 4 days for anorexic precautions have been discontinued given that C. difficile GI pathogen panel has not been collected; infectious colitis seems unlikely at this point given her lack of diarrhea but inflammatory bowel disease could be a possibility -GI has canceled her stool studies if she starts having diarrhea again they are recommending reordering -She continues to have bilateral lower abdominal pain so we will get a complete pelvic ultrasound -Her ultrasound pelvis done and showed "Sonographic appearance of the ovaries can be seen in the setting of polycystic ovarian syndrome.  Normal ovarian blood flow.  The previous questioned endometrial polyp is not seen on the current exam. Moderate amount of free fluid in the pelvis, appearing simple." -Per GI if GYN studies are negative and lower abdominal pain persists she may have a flex sigmoidoscopy to evaluate rectosigmoid findings on the CT scan -She is given diltiazem gel for anal fissure -UA done and showed hazy appearance with large leukocytes, negative nitrites, few bacteria, 0-5 RBCs per high-power field, 11-20 squamous epithelial cells, greater than 50 WBCs with urine culture showing multiple species and suggesting recollection -Blood cultures obtained on 03/10/2022 showing no growth to date at 2 days but she spiked another temperature yesterday so blood cultures were repeated and pending -Wet prep done and negative -Continued ceftriaxone and metronidazole, add on doxycycline and because she is positive for chlamydia we will discontinue the ceftriaxone metronidazole continue on doxycycline -RPR was nonreactive -IV fluid hydration with normal saline now stopped -Pain management with ibuprofen 400 mg x 1,  morphine IV 1 mg every 4 hours as needed for severe pain -Continuing dicyclomine 10 mg 3 times daily before meals -Continue  supportive care and antiemetics with ondansetron 4 mg every 6 hours as needed nausea vomiting -Testing for gonorrhea, chlamydia, syphilis, and HIV -HIV and RPR nonreactive as above and gonorrhea and Chlamydia testing done and she was positive for chlamydia -Monitor WBC count slowly trending down and finally normalized -GI feels that she is chlamydia proctitis versus pelvic inflammatory disease with associated inflammation and remains on doxycycline and given her current infection they are not we will plan endoscopic evaluation unless patient's symptoms do not improve and  Plan for outpatient evaluation in 4 to 6 weeks after discharge   Chronic Normocytic Anemia -Hemoglobin/hematocrit went from 10.0/32.2 -> 9.3/29.5 -> 8.6/20.0 -> 8.5/28.1 -Check anemia panel in the a.m. -Continue monitor for signs and symptoms of bleeding; no overt bleeding noted   Anxiety and depression -Continue home medication after pharmacy med rec and will resume her home fluoxetine   Elevated D-dimer with chest discomfort -D-dimer was 1.93 -Given this he underwent a CTA of the chest to evaluate for PE and this was negative and also showed a normal examination   Hypokalemia -Patient's K+ went from 3.7 -> 3.1 -> 3.8 -Replete with po KCL 40 mEQ BID x2 -Checked Mag Level and was 1.9 -Continue to Monitor and Replete as Necessary -Repeat CMP in the AM    Obesity -Complicates overall prognosis and care -Estimated body mass index is 35.88 kg/m as calculated from the following:   Height as of this encounter: 5' 8"  (1.727 m).   Weight as of this encounter: 107 kg.  -Weight Loss and Dietary Counseling given  DVT prophylaxis: SCDs Start: 03/10/22 0408    Code Status: Full Code Family Communication: No family currently at bedside  Disposition Plan:  Level of care: Med-Surg Status is: Inpatient Remains inpatient appropriate because: He continues to have some abdominal discomfort and is getting treated for chlamydia  and likely can be discharged in next 24 to 48 hours if she is improved   Consultants:  Gastroenterology  Procedures:  Complete pelvic ultrasound as above  Antimicrobials:  Anti-infectives (From admission, onward)    Start     Dose/Rate Route Frequency Ordered Stop   03/12/22 2200  doxycycline (VIBRA-TABS) tablet 100 mg        100 mg Oral Every 12 hours 03/12/22 1420 03/24/22 0959   03/10/22 2300  cefTRIAXone (ROCEPHIN) 1 g in sodium chloride 0.9 % 100 mL IVPB  Status:  Discontinued        1 g 200 mL/hr over 30 Minutes Intravenous Every 24 hours 03/10/22 0409 03/10/22 1159   03/10/22 2300  cefTRIAXone (ROCEPHIN) 2 g in sodium chloride 0.9 % 100 mL IVPB  Status:  Discontinued        2 g 200 mL/hr over 30 Minutes Intravenous Every 24 hours 03/10/22 1159 03/12/22 1420   03/10/22 1100  metroNIDAZOLE (FLAGYL) IVPB 500 mg  Status:  Discontinued        500 mg 100 mL/hr over 60 Minutes Intravenous Every 12 hours 03/10/22 0409 03/12/22 1420   03/10/22 0500  doxycycline (VIBRAMYCIN) 100 mg in sodium chloride 0.9 % 250 mL IVPB  Status:  Discontinued        100 mg 125 mL/hr over 120 Minutes Intravenous Every 12 hours 03/10/22 0409 03/12/22 1420   03/09/22 2315  metroNIDAZOLE (FLAGYL) IVPB 500 mg        500  mg 100 mL/hr over 60 Minutes Intravenous  Once 03/09/22 2308 03/10/22 0720   03/09/22 2300  cefTRIAXone (ROCEPHIN) 1 g in sodium chloride 0.9 % 100 mL IVPB        1 g 200 mL/hr over 30 Minutes Intravenous  Once 03/09/22 2253 03/10/22 0029       Subjective: Examined at bedside and she is complaining about a headache today.  She states that she continues have some abdominal discomfort.  No nausea or vomiting.  Denies any lightheadedness or dizziness.  No other concerns or complaints this time.  Objective: Vitals:   03/12/22 0531 03/12/22 0950 03/12/22 1403 03/12/22 1746  BP: 126/82 125/79 128/76 117/69  Pulse: (!) 103 88 92 (!) 102  Resp: 18 18 16 16   Temp: 98.8 F (37.1 C) 98.1 F  (36.7 C) 99.1 F (37.3 C) 99.9 F (37.7 C)  TempSrc: Oral Oral Oral Oral  SpO2: 97% 98% 95% 98%  Weight:      Height:        Intake/Output Summary (Last 24 hours) at 03/12/2022 1909 Last data filed at 03/12/2022 1800 Gross per 24 hour  Intake 1974.56 ml  Output 2503 ml  Net -528.44 ml   Filed Weights   03/09/22 1733  Weight: 107 kg   Examination: Physical Exam:  Constitutional: WN/WD young African-American female currently no acute distress appears calm Respiratory: Diminished to auscultation bilaterally with coarse breath sounds, no wheezing, rales, rhonchi or crackles. Normal respiratory effort and patient is not tachypenic. No accessory muscle use.  Unlabored breathing Cardiovascular: RRR, no murmurs / rubs / gallops. S1 and S2 auscultated.  Abdomen: Soft, tender to palpate in the lower abdomen, distended secondary body habitus. Bowel sounds positive.  GU: Deferred. Musculoskeletal: No clubbing / cyanosis of digits/nails. No joint deformity upper and lower extremities. Skin: No rashes, lesions, ulcers on limited skin evaluation. No induration; Warm and dry.  Neurologic: CN 2-12 grossly intact with no focal deficits. Romberg sign and cerebellar reflexes not assessed.  Psychiatric: Normal judgment and insight. Alert and oriented x 3. Normal mood and appropriate affect.   Data Reviewed: I have personally reviewed following labs and imaging studies  CBC: Recent Labs  Lab 03/09/22 1745 03/10/22 0431 03/11/22 0035 03/12/22 0807  WBC 13.6* 12.3* 11.7* 8.7  NEUTROABS  --   --   --  5.8  HGB 10.0* 9.3* 8.6* 8.5*  HCT 32.2* 29.5* 28.0* 28.1*  MCV 78.7* 81.5 81.4 82.6  PLT 234 205 204 373   Basic Metabolic Panel: Recent Labs  Lab 03/09/22 1745 03/11/22 0035 03/11/22 0517 03/12/22 0521  NA 133* 137  --  139  K 3.7 3.1*  --  3.8  CL 102 107  --  111  CO2 21* 22  --  20*  GLUCOSE 99 95  --  100*  BUN <5* 5*  --  5*  CREATININE 0.88 0.85  --  0.77  CALCIUM 8.8* 7.9*   --  8.2*  MG  --   --  1.8 1.9  PHOS  --   --  2.5 2.7   GFR: Estimated Creatinine Clearance: 143.6 mL/min (by C-G formula based on SCr of 0.77 mg/dL). Liver Function Tests: Recent Labs  Lab 03/09/22 1745 03/11/22 0035 03/12/22 0521  AST 19 21 17   ALT 23 28 21   ALKPHOS 76 71 70  BILITOT 0.5 0.4 0.4  PROT 8.1 7.0 6.7  ALBUMIN 3.8 2.7* 2.8*   Recent Labs  Lab 03/09/22 1745  LIPASE <  10*   No results for input(s): "AMMONIA" in the last 168 hours. Coagulation Profile: No results for input(s): "INR", "PROTIME" in the last 168 hours. Cardiac Enzymes: No results for input(s): "CKTOTAL", "CKMB", "CKMBINDEX", "TROPONINI" in the last 168 hours. BNP (last 3 results) No results for input(s): "PROBNP" in the last 8760 hours. HbA1C: No results for input(s): "HGBA1C" in the last 72 hours. CBG: No results for input(s): "GLUCAP" in the last 168 hours. Lipid Profile: No results for input(s): "CHOL", "HDL", "LDLCALC", "TRIG", "CHOLHDL", "LDLDIRECT" in the last 72 hours. Thyroid Function Tests: No results for input(s): "TSH", "T4TOTAL", "FREET4", "T3FREE", "THYROIDAB" in the last 72 hours. Anemia Panel: No results for input(s): "VITAMINB12", "FOLATE", "FERRITIN", "TIBC", "IRON", "RETICCTPCT" in the last 72 hours. Sepsis Labs: Recent Labs  Lab 03/10/22 1208  LATICACIDVEN 1.2   Recent Results (from the past 240 hour(s))  Resp Panel by RT-PCR (Flu A&B, Covid) Anterior Nasal Swab     Status: None   Collection Time: 03/09/22  5:45 PM   Specimen: Anterior Nasal Swab  Result Value Ref Range Status   SARS Coronavirus 2 by RT PCR NEGATIVE NEGATIVE Final    Comment: (NOTE) SARS-CoV-2 target nucleic acids are NOT DETECTED.  The SARS-CoV-2 RNA is generally detectable in upper respiratory specimens during the acute phase of infection. The lowest concentration of SARS-CoV-2 viral copies this assay can detect is 138 copies/mL. A negative result does not preclude SARS-Cov-2 infection and  should not be used as the sole basis for treatment or other patient management decisions. A negative result may occur with  improper specimen collection/handling, submission of specimen other than nasopharyngeal swab, presence of viral mutation(s) within the areas targeted by this assay, and inadequate number of viral copies(<138 copies/mL). A negative result must be combined with clinical observations, patient history, and epidemiological information. The expected result is Negative.  Fact Sheet for Patients:  EntrepreneurPulse.com.au  Fact Sheet for Healthcare Providers:  IncredibleEmployment.be  This test is no t yet approved or cleared by the [place] FDA and  has been authorized for detection and/or diagnosis of SARS-CoV-2 by FDA under an Emergency Use Authorization (EUA). This EUA will remain  in effect (meaning this test can be used) for the duration of the COVID-19 declaration under Section 564(b)(1) of the Act, 21 U.S.C.section 360bbb-3(b)(1), unless the authorization is terminated  or revoked sooner.       Influenza A by PCR NEGATIVE NEGATIVE Final   Influenza B by PCR NEGATIVE NEGATIVE Final    Comment: (NOTE) The Xpert Xpress SARS-CoV-2/FLU/RSV plus assay is intended as an aid in the diagnosis of influenza from Nasopharyngeal swab specimens and should not be used as a sole basis for treatment. Nasal washings and aspirates are unacceptable for Xpert Xpress SARS-CoV-2/FLU/RSV testing.  Fact Sheet for Patients: EntrepreneurPulse.com.au  Fact Sheet for Healthcare Providers: IncredibleEmployment.be  This test is not yet approved or cleared by the [place] FDA and has been authorized for detection and/or diagnosis of SARS-CoV-2 by FDA under an Emergency Use Authorization (EUA). This EUA will remain in effect (meaning this test can be used) for the duration of the COVID-19 declaration  under Section 564(b)(1) of the Act, 21 U.S.C. section 360bbb-3(b)(1), unless the authorization is terminated or revoked.  Performed at [facility], [address], [place], [place] [address]   Urine Culture     Status: Abnormal   Collection Time: 03/09/22 10:37 PM   Specimen: Urine, Clean Catch  Result Value Ref Range Status  Specimen Description   Final    URINE, CLEAN CATCH Performed at [facility], [address] [place], [place] [address]    Special Requests   Final    NONE Performed at [facility], [address] [place], [place] [address]    Culture MULTIPLE SPECIES PRESENT, SUGGEST RECOLLECTION (A)  Final   Report Status 03/11/2022 FINAL  Final  Wet prep, genital     Status: None   Collection Time: 03/09/22 10:37 PM   Specimen: PATH Cytology Cervicovaginal Ancillary Only  Result Value Ref Range Status   Yeast Wet Prep HPF POC NONE SEEN NONE SEEN Final   Trich, Wet Prep NONE SEEN NONE SEEN Final   Clue Cells Wet Prep HPF POC NONE SEEN NONE SEEN Final   WBC, Wet Prep HPF POC <10 <10 Final   Sperm NONE SEEN  Final    Comment: Performed at [facility], [address] [place], [place] [address]  Culture, blood (Routine X 2) w Reflex to ID Panel     Status: None (Preliminary result)   Collection Time: 03/10/22 12:07 PM   Specimen: BLOOD  Result Value Ref Range Status   Specimen Description   Final    BLOOD LEFT ANTECUBITAL Performed at [facility] [address] [place], [place] [address]    Special Requests   Final    BOTTLES DRAWN AEROBIC AND ANAEROBIC Blood Culture results may not be optimal due to an inadequate volume of blood received in culture bottles Performed at [facility] [address], [place], [place] [address]    Culture   Final    NO GROWTH 2 DAYS Performed at [facility] [address], [place], [place] [address]     Report Status PENDING  Incomplete  Culture, blood (Routine X 2) w Reflex to ID Panel     Status: None (Preliminary result)   Collection Time: 03/11/22  7:14 PM   Specimen: BLOOD  Result Value Ref Range Status   Specimen Description   Final    BLOOD BLOOD RIGHT ARM Performed at [facility] [address] [place], [place] [address]    Special Requests   Final    IN PEDIATRIC BOTTLE Blood Culture adequate volume Performed at [facility] [address] [place], [place] [address]    Culture   Final    NO GROWTH < 12 HOURS Performed at [facility] [address] [place],  [address]    Report Status PENDING  Incomplete     Radiology Studies: US PELVIC COMPLETE W TRANSVAGINAL AND TORSION R/O  Result Date: 03/11/2022 CLINICAL DATA:  Lower abdominal pain. EXAM: TRANSABDOMINAL AND TRANSVAGINAL ULTRASOUND OF PELVIS DOPPLER ULTRASOUND OF OVARIES TECHNIQUE: Both transabdominal and transvaginal ultrasound examinations of the pelvis were performed. Transabdominal technique was performed for global imaging of the pelvis including uterus, ovaries, adnexal regions, and pelvic cul-de-sac. It was necessary to proceed with endovaginal exam following the transabdominal exam to visualize the uterus, endometrium and ovaries. Color and duplex Doppler ultrasound was utilized to evaluate blood flow to the ovaries. COMPARISON:  CT 03/09/2022, pelvic ultrasound 11/20/2021 FINDINGS: Uterus Measurements: 5.5 x 3.1 x 4.1 cm = volume: 36 mL. The uterus is retroverted. No fibroids or other mass visualized. Endometrium Thickness: 10 mm. The previous possible endometrial polyp is not seen on the current exam. Right ovary Measurements: 3.7 x 2.4 x 2.3 cm = volume: 10 mL. Increased  number of relative uniformly sized follicles. There may be slight echogenic ovarian stroma. No dominant cyst or solid lesion. Normal ovarian blood flow. Left ovary Measurements: 3.2 x 2.9 x 2.4  cm = volume: 11.6 mL. Increased number of relative uniformly sized follicles. There may be slight echogenic ovarian stroma. No dominant cyst or solid lesion. Normal blood flow. Hypoechoic structure in the left adnexa measuring 8 mm short axis is the appearance of a pelvic lymph node. Pulsed Doppler evaluation of both ovaries demonstrates normal low-resistance arterial and venous waveforms. Other findings Moderate amount of free fluid in the pelvis, appearing simple. IMPRESSION: 1. Sonographic appearance of the ovaries can be seen in the setting of polycystic ovarian syndrome. 2. Normal ovarian blood flow. 3. The previous questioned endometrial polyp is not seen on the current exam. 4. Moderate amount of free fluid in the pelvis, appearing simple. Electronically Signed   By: Keith Rake M.D.   On: 03/11/2022 15:14   CT Angio Chest Pulmonary Embolism (PE) W or WO Contrast  Result Date: 03/11/2022 CLINICAL DATA:  Pulmonary embolism (PE) suspected, positive D-dimer. Tachycardia, fever, dyspnea EXAM: CT ANGIOGRAPHY CHEST WITH CONTRAST TECHNIQUE: Multidetector CT imaging of the chest was performed using the standard protocol during bolus administration of intravenous contrast. Multiplanar CT image reconstructions and MIPs were obtained to evaluate the vascular anatomy. RADIATION DOSE REDUCTION: This exam was performed according to the departmental dose-optimization program which includes automated exposure control, adjustment of the mA and/or kV according to patient size and/or use of iterative reconstruction technique. CONTRAST:  136m OMNIPAQUE IOHEXOL 350 MG/ML SOLN COMPARISON:  None Available. FINDINGS: Cardiovascular: Adequate opacification of the pulmonary arterial tree. No intraluminal filling defect identified to suggest acute pulmonary embolism. Central pulmonary arteries are of normal caliber. No significant coronary artery calcification. Cardiac size within normal limits. No pericardial effusion. No  significant atherosclerotic calcification within the thoracic aorta. No aortic aneurysm. Mediastinum/Nodes: No enlarged mediastinal, hilar, or axillary lymph nodes. Thyroid gland, trachea, and esophagus demonstrate no significant findings. Lungs/Pleura: Lungs are clear. No pleural effusion or pneumothorax. Upper Abdomen: No acute abnormality. Musculoskeletal: No chest wall abnormality. No acute or significant osseous findings. Review of the MIP images confirms the above findings. IMPRESSION: No pulmonary embolism. Normal examination. Electronically Signed   By: AFidela SalisburyM.D.   On: 03/11/2022 04:00     Scheduled Meds:  dicyclomine  10 mg Oral TID AC   diltiazem   Topical TID   doxycycline  100 mg Oral Q12H   FLUoxetine  40 mg Oral Daily   Continuous Infusions:   LOS: 2 days   ORaiford Noble DO [facility] Available via Epic secure chat 7am-7pm After these hours, please refer to coverage provider listed on [email or web address] 03/12/2022, 7:09 PM

## 2022-03-12 NOTE — Progress Notes (Signed)
Progress Note  Primary GI: Dr. Candis Schatz   Subjective  Chief Complaint: Abdominal pain, fever, abnormal abdominal CT   Tmax overnight 101.9, patient is on Rocephin doxycycline and metronidazole. Pelvic ultrasound complete with polycystic ovarian syndrome, normal ovarian blood flow, question endometrial polyp is not seen on current exam, moderate amount of free fluid in pelvis. HA last night with 1 episode of vomiting.  Has history of chronic migraines Had 2-3 BM's last night, soft large bowel movements, denies hematochezia, painful to have BM. Continuing lower abdominal discomfort worse with movement.  Not relieved by bowel movements.  No dysuria.    Objective   Vital signs in last 24 hours: Temp:  [97.5 F (36.4 C)-101.9 F (38.8 C)] 98.8 F (37.1 C) (08/24 0531) Pulse Rate:  [96-110] 103 (08/24 0531) Resp:  [16-18] 18 (08/24 0531) BP: (105-126)/(67-82) 126/82 (08/24 0531) SpO2:  [94 %-100 %] 97 % (08/24 0531) Last BM Date : 03/12/22 Last BM recorded by nurses in past 5 days No data recorded  General:   female in no acute distress  Heart:  Regular rate and rhythm; no murmurs Pulm: Clear anteriorly; no wheezing Abdomen:  Soft, Obese AB, Active bowel sounds. moderate tenderness in the lower abdomen. With guarding and Without rebound, No organomegaly appreciated. Extremities:  without  edema. Neurologic:  Alert and  oriented x4;  No focal deficits.  Psych:  Cooperative. Normal mood and affect.  Intake/Output from previous day: 08/23 0701 - 08/24 0700 In: 2882.3 [P.O.:1920; I.V.:562.3; IV Piggyback:399.9] Out: 2803 [Urine:2800; Stool:3] Intake/Output this shift: No intake/output data recorded.  Studies/Results: US PELVIC COMPLETE W TRANSVAGINAL AND TORSION R/O  Result Date: 03/11/2022 CLINICAL DATA:  Lower abdominal pain. EXAM: TRANSABDOMINAL AND TRANSVAGINAL ULTRASOUND OF PELVIS DOPPLER ULTRASOUND OF OVARIES TECHNIQUE: Both transabdominal and transvaginal ultrasound  examinations of the pelvis were performed. Transabdominal technique was performed for global imaging of the pelvis including uterus, ovaries, adnexal regions, and pelvic cul-de-sac. It was necessary to proceed with endovaginal exam following the transabdominal exam to visualize the uterus, endometrium and ovaries. Color and duplex Doppler ultrasound was utilized to evaluate blood flow to the ovaries. COMPARISON:  CT 03/09/2022, pelvic ultrasound 11/20/2021 FINDINGS: Uterus Measurements: 5.5 x 3.1 x 4.1 cm = volume: 36 mL. The uterus is retroverted. No fibroids or other mass visualized. Endometrium Thickness: 10 mm. The previous possible endometrial polyp is not seen on the current exam. Right ovary Measurements: 3.7 x 2.4 x 2.3 cm = volume: 10 mL. Increased number of relative uniformly sized follicles. There may be slight echogenic ovarian stroma. No dominant cyst or solid lesion. Normal ovarian blood flow. Left ovary Measurements: 3.2 x 2.9 x 2.4 cm = volume: 11.6 mL. Increased number of relative uniformly sized follicles. There may be slight echogenic ovarian stroma. No dominant cyst or solid lesion. Normal blood flow. Hypoechoic structure in the left adnexa measuring 8 mm short axis is the appearance of a pelvic lymph node. Pulsed Doppler evaluation of both ovaries demonstrates normal low-resistance arterial and venous waveforms. Other findings Moderate amount of free fluid in the pelvis, appearing simple. IMPRESSION: 1. Sonographic appearance of the ovaries can be seen in the setting of polycystic ovarian syndrome. 2. Normal ovarian blood flow. 3. The previous questioned endometrial polyp is not seen on the current exam. 4. Moderate amount of free fluid in the pelvis, appearing simple. Electronically Signed   By: Keith Rake M.D.   On: 03/11/2022 15:14   CT Angio Chest Pulmonary Embolism (PE) W or  WO Contrast  Result Date: 03/11/2022 CLINICAL DATA:  Pulmonary embolism (PE) suspected, positive D-dimer.  Tachycardia, fever, dyspnea EXAM: CT ANGIOGRAPHY CHEST WITH CONTRAST TECHNIQUE: Multidetector CT imaging of the chest was performed using the standard protocol during bolus administration of intravenous contrast. Multiplanar CT image reconstructions and MIPs were obtained to evaluate the vascular anatomy. RADIATION DOSE REDUCTION: This exam was performed according to the departmental dose-optimization program which includes automated exposure control, adjustment of the mA and/or kV according to patient size and/or use of iterative reconstruction technique. CONTRAST:  132m OMNIPAQUE IOHEXOL 350 MG/ML SOLN COMPARISON:  None Available. FINDINGS: Cardiovascular: Adequate opacification of the pulmonary arterial tree. No intraluminal filling defect identified to suggest acute pulmonary embolism. Central pulmonary arteries are of normal caliber. No significant coronary artery calcification. Cardiac size within normal limits. No pericardial effusion. No significant atherosclerotic calcification within the thoracic aorta. No aortic aneurysm. Mediastinum/Nodes: No enlarged mediastinal, hilar, or axillary lymph nodes. Thyroid gland, trachea, and esophagus demonstrate no significant findings. Lungs/Pleura: Lungs are clear. No pleural effusion or pneumothorax. Upper Abdomen: No acute abnormality. Musculoskeletal: No chest wall abnormality. No acute or significant osseous findings. Review of the MIP images confirms the above findings. IMPRESSION: No pulmonary embolism. Normal examination. Electronically Signed   By: AFidela SalisburyM.D.   On: 03/11/2022 04:00    Lab Results: Recent Labs    03/09/22 1745 03/10/22 0431 03/11/22 0035  WBC 13.6* 12.3* 11.7*  HGB 10.0* 9.3* 8.6*  HCT 32.2* 29.5* 28.0*  PLT 234 205 204   BMET Recent Labs    03/09/22 1745 03/11/22 0035  NA 133* 137  K 3.7 3.1*  CL 102 107  CO2 21* 22  GLUCOSE 99 95  BUN <5* 5*  CREATININE 0.88 0.85  CALCIUM 8.8* 7.9*   LFT Recent Labs     03/11/22 0035  PROT 7.0  ALBUMIN 2.7*  AST 21  ALT 28  ALKPHOS 71  BILITOT 0.4   PT/INR No results for input(s): "LABPROT", "INR" in the last 72 hours.    Patient profile:   21year old female with chronic constipation, recent development loose, mucoid stools with blood after starting Linzess.  History of anal fissure.  Admitted with sepsis possible related to UTI versus PID versus proctocolitis suggested on CT.   Impression/Plan:   Sepsis with abnormal AB CT Pelvic [place]ore[place]ossible PCOS, non specific free fluid. Stool studies canceled as patient's not had a bowel movement (generally baseline is more constipation) HIV negative, genital wet prep negative, RPR negative GC chlamydia probe + for chlamydia 08/22 blood cultures no growth, repeat blood cultures yesterday no growth at 12 hours 03/11/2022 WBC 11.7  03/10/2022 Lactic Acid 1.2  Blood pressure 126/82, pulse (!) 103, temperature 98.8 F (37.1 C), temperature source Oral, resp. rate 18, height 5' 8"  (1.727 m), weight 107 kg, SpO2 97 %. T max 101 Patient positive for chlamydia 08/21, likely this could be chlamydia proctitis versus pelvic inflammatory disease with associated inflammation. She is on doxycycline. With current infection we will not plan for endoscopic evaluation this visit unless patient's symptoms do not improve.  We will plan for outpatient endoscopic evaluation. We will plan for follow-up with Dr. CCandis Schatzor one of the apps in our office 4 to 6 weeks after discharge.  Chronic anemia CBC on 03/11/2022   WBC 11.7 HGB 8.6 MCV 81.4 Platelets 204 Anemia studies on 11/17/2021  Iron 261 Ferritin 39   Hypokalemia/hypocalcemia    LOS: 2 days   AVladimir Crofts  03/12/2022, 8:01 AM

## 2022-03-12 NOTE — Progress Notes (Signed)
MD Opyd was notified that patient is complaining of pain in her left buttocks that radiates down her leg. Per patient she's had this pain prior to admission but pain has increased and she is having difficulty ambulating. No redness, warmth, or swelling of leg.

## 2022-03-13 ENCOUNTER — Inpatient Hospital Stay (HOSPITAL_COMMUNITY): Payer: Medicaid Other

## 2022-03-13 ENCOUNTER — Encounter (HOSPITAL_COMMUNITY): Payer: Self-pay | Admitting: Physician Assistant

## 2022-03-13 ENCOUNTER — Telehealth (INDEPENDENT_AMBULATORY_CARE_PROVIDER_SITE_OTHER): Payer: Medicaid Other | Admitting: Physician Assistant

## 2022-03-13 DIAGNOSIS — K602 Anal fissure, unspecified: Secondary | ICD-10-CM | POA: Diagnosis not present

## 2022-03-13 DIAGNOSIS — F331 Major depressive disorder, recurrent, moderate: Secondary | ICD-10-CM

## 2022-03-13 DIAGNOSIS — R933 Abnormal findings on diagnostic imaging of other parts of digestive tract: Secondary | ICD-10-CM | POA: Diagnosis not present

## 2022-03-13 DIAGNOSIS — F411 Generalized anxiety disorder: Secondary | ICD-10-CM

## 2022-03-13 DIAGNOSIS — D649 Anemia, unspecified: Secondary | ICD-10-CM | POA: Diagnosis not present

## 2022-03-13 DIAGNOSIS — K529 Noninfective gastroenteritis and colitis, unspecified: Secondary | ICD-10-CM | POA: Diagnosis not present

## 2022-03-13 LAB — CBC WITH DIFFERENTIAL/PLATELET
Abs Immature Granulocytes: 0.24 10*3/uL — ABNORMAL HIGH (ref 0.00–0.07)
Basophils Absolute: 0 10*3/uL (ref 0.0–0.1)
Basophils Relative: 0 %
Eosinophils Absolute: 0.1 10*3/uL (ref 0.0–0.5)
Eosinophils Relative: 2 %
HCT: 29.9 % — ABNORMAL LOW (ref 36.0–46.0)
Hemoglobin: 9.1 g/dL — ABNORMAL LOW (ref 12.0–15.0)
Immature Granulocytes: 3 %
Lymphocytes Relative: 24 %
Lymphs Abs: 1.9 10*3/uL (ref 0.7–4.0)
MCH: 24.7 pg — ABNORMAL LOW (ref 26.0–34.0)
MCHC: 30.4 g/dL (ref 30.0–36.0)
MCV: 81 fL (ref 80.0–100.0)
Monocytes Absolute: 0.9 10*3/uL (ref 0.1–1.0)
Monocytes Relative: 12 %
Neutro Abs: 4.6 10*3/uL (ref 1.7–7.7)
Neutrophils Relative %: 59 %
Platelets: 294 10*3/uL (ref 150–400)
RBC: 3.69 MIL/uL — ABNORMAL LOW (ref 3.87–5.11)
RDW: 15.7 % — ABNORMAL HIGH (ref 11.5–15.5)
WBC: 7.8 10*3/uL (ref 4.0–10.5)
nRBC: 0.3 % — ABNORMAL HIGH (ref 0.0–0.2)

## 2022-03-13 LAB — COMPREHENSIVE METABOLIC PANEL
ALT: 19 U/L (ref 0–44)
AST: 20 U/L (ref 15–41)
Albumin: 2.8 g/dL — ABNORMAL LOW (ref 3.5–5.0)
Alkaline Phosphatase: 62 U/L (ref 38–126)
Anion gap: 4 — ABNORMAL LOW (ref 5–15)
BUN: <5 mg/dL — ABNORMAL LOW (ref 6–20)
CO2: 24 mmol/L (ref 22–32)
Calcium: 8.5 mg/dL — ABNORMAL LOW (ref 8.9–10.3)
Chloride: 113 mmol/L — ABNORMAL HIGH (ref 98–111)
Creatinine, Ser: 0.8 mg/dL (ref 0.44–1.00)
GFR, Estimated: >60 mL/min (ref >60)
Glucose, Bld: 93 mg/dL (ref 70–99)
Potassium: 3.8 mmol/L (ref 3.5–5.1)
Sodium: 141 mmol/L (ref 135–145)
Total Bilirubin: 0.5 mg/dL (ref 0.3–1.2)
Total Protein: 6.9 g/dL (ref 6.5–8.1)

## 2022-03-13 LAB — PHOSPHORUS: Phosphorus: 4.2 mg/dL (ref 2.5–4.6)

## 2022-03-13 LAB — MAGNESIUM: Magnesium: 2 mg/dL (ref 1.7–2.4)

## 2022-03-13 MED ORDER — METHOCARBAMOL 500 MG PO TABS
500.0000 mg | ORAL_TABLET | Freq: Four times a day (QID) | ORAL | Status: DC | PRN
Start: 2022-03-13 — End: 2022-03-16
  Filled 2022-03-13: qty 1

## 2022-03-13 MED ORDER — ZONISAMIDE 25 MG PO CAPS
50.0000 mg | ORAL_CAPSULE | Freq: Two times a day (BID) | ORAL | Status: DC
  Filled 2022-03-13: qty 2

## 2022-03-13 MED ORDER — GADOBUTROL 1 MMOL/ML IV SOLN
10.0000 mL | Freq: Once | INTRAVENOUS | Status: AC | PRN
  Administered 2022-03-13: 10 mL via INTRAVENOUS

## 2022-03-13 MED ORDER — ZONISAMIDE 100 MG PO CAPS
100.0000 mg | ORAL_CAPSULE | Freq: Every day | ORAL | Status: DC
  Administered 2022-03-13 – 2022-03-15 (×3): 100 mg via ORAL
  Filled 2022-03-13 (×3): qty 1

## 2022-03-13 MED ORDER — GABAPENTIN 300 MG PO CAPS
300.0000 mg | ORAL_CAPSULE | Freq: Two times a day (BID) | ORAL | Status: DC
  Administered 2022-03-13 – 2022-03-16 (×7): 300 mg via ORAL
  Filled 2022-03-13 (×7): qty 1

## 2022-03-13 NOTE — Progress Notes (Unsigned)
[place] MD/PA/NP OP Progress Note  Virtual Visit via Telephone Note  I connected with [name] on 03/13/22 at  2:30 PM EDT by telephone and verified that I am speaking with the correct person using two identifiers.  Location: Patient: Home Provider: Clinic   I discussed the limitations, risks, security and privacy concerns of performing an evaluation and management service by telephone and the availability of in person appointments. I also discussed with the patient that there may be a patient responsible charge related to this service. The patient expressed understanding and agreed to proceed.  Follow Up Instructions:   I discussed the assessment and treatment plan with the patient. The patient was provided an opportunity to ask questions and all were answered. The patient agreed with the plan and demonstrated an understanding of the instructions.   The patient was advised to call back or seek an in-person evaluation if the symptoms worsen or if the condition fails to improve as anticipated.  I provided 13 minutes of non-face-to-face time during this encounter.  Malachy Mood, PA    03/13/2022 2:43 PM [name]  MRN:  [id number]  Chief Complaint:  Chief Complaint  Patient presents with   Follow-up    HPI:   Bethzaida Boord is a 21 year old female with a past psychiatric history significant for generalized anxiety disorder and major depressive disorder who presents to [facility] via virtual telephone visit for follow-up and medication management.  Patient is currently being managed on the following medications: Fluoxetine (Prozac) 40 mg daily.  Patient reports that she is currently in the hospital due to experiencing stomach, back, and leg pain that has been going on for a week.  Patient reports that she has also been having GI issues and that her current providers are looking for the underlying issue.  Patient continues to take  Prozac 40 mg daily but has not noticed a difference in her mood due to her current stressors.  Patient states that she has been dealing with depression for the last 2 weeks due to pain and recent news regarding the family member.  Patient also adds that she has experienced an anxiety attack recently that occurred out of the blue after standing up and going to the bathroom.  Patient denies any other stressors at this time and is awaiting for results from her providers in the hospital.  A PHQ-9 screen was performed with the patient scoring a 17.  A GAD-7 screen was also performed with the patient scoring a 20.  Patient is alert and oriented x4, calm, cooperative, and fully engaged in conversation during the encounter.  Patient endorses anxious mood.  Patient denies suicidal or homicidal ideations.  She further denies auditory or visual hallucinations and does not appear to be responding to internal/external stimuli.  Patient is unsure of the exact amount of sleep she receives daily but notes that she has been receiving roughly 4 hours of sleep recently.  Patient endorses decreased appetite and eats on average 1 meal per day.  Patient denies alcohol consumption, tobacco use, and illicit drug use.  Visit Diagnosis:  No diagnosis found.   Past Psychiatric History:  Major depressive disorder Generalized anxiety disorder  Past Medical History:  Past Medical History:  Diagnosis Date   Anemia    Anxiety    Chlamydia 11/13/2021   Constipation, chronic    Depression    Environmental allergies    Ganglion cyst of dorsum of left wrist  RECURRENT   GERD (gastroesophageal reflux disease)    watches diet   Headache    Hypertension    IBS (irritable bowel syndrome)    Lactose intolerance     Past Surgical History:  Procedure Laterality Date   GANGLION CYST EXCISION Left 04/24/2021   Procedure: left recurrent dorsal carpal ganglion excision;  Surgeon: [name], MD;  Location: [facility];  Service: Orthopedics;  Laterality: Left;   TONSILLECTOMY AND ADENOIDECTOMY     AGE 41   WISDOM TOOTH EXTRACTION  03/18/2021   WRIST GANGLION EXCISION Left 02/2020    Family Psychiatric History:  Father - patient denies that her father has been officially diagnosed with any mental health diagnoses but states that he suffers from mental health.   Patient also believes that her Aunt (maternal) also suffers from mental health.  Family History:  Family History  Problem Relation Age of Onset   Irritable bowel syndrome Mother    Asthma Father    Diabetes Maternal Grandmother    Diabetes Paternal Grandmother    Kidney disease Paternal Alanda Slim' disease Paternal Aunt    Ovarian cancer Maternal Aunt    Stomach cancer Neg Hx    Esophageal cancer Neg Hx    Colon cancer Neg Hx     Social History:  Social History   Socioeconomic History   Marital status: Single    Spouse name: Not on file   Number of children: 0   Years of education: Not on file   Highest education level: Not on file  Occupational History   Occupation: student  Tobacco Use   Smoking status: Never   Smokeless tobacco: Never  Vaping Use   Vaping Use: Never used  Substance and Sexual Activity   Alcohol use: Never   Drug use: Never   Sexual activity: Yes    Birth control/protection: OCP  Other Topics Concern   Not on file  Social History Narrative   Not on file   Social Determinants of Health   Financial Resource Strain: Not on file  Food Insecurity: Not on file  Transportation Needs: Not on file  Physical Activity: Not on file  Stress: No Stress Concern Present (07/04/2021)   [place]    Feeling of Stress : Only a little  Recent Concern: Stress - Stress Concern Present (06/20/2021)   [place]    Feeling of Stress : To some extent  Social  Connections: Not on file    Allergies:  Allergies  Allergen Reactions   Blue Dyes (Parenteral) Anaphylaxis   Blueberry [Vaccinium Angustifolium] Anaphylaxis   Raspberry Anaphylaxis   Shellfish Allergy Anaphylaxis    ALL SHELLFISH   Amitriptyline Other (See Comments)    Fast heart rate and throat closing    Metabolic Disorder Labs: Lab Results  Component Value Date   HGBA1C 5.1 11/27/2021   Lab Results  Component Value Date   PROLACTIN 23.6 (H) 11/27/2021   PROLACTIN 24.1 (H) 11/13/2021   No results found for: "CHOL", "TRIG", "HDL", "CHOLHDL", "VLDL", "LDLCALC" Lab Results  Component Value Date   TSH 6.370 (H) 11/17/2021   TSH 5.470 (H) 11/13/2021    Therapeutic Level Labs: No results found for: "LITHIUM" No results found for: "VALPROATE" No results found for: "CBMZ"  Current Medications: No current facility-administered medications for this visit.   No current outpatient medications on file.   Facility-Administered  Medications Ordered in Other Visits  Medication Dose Route Frequency Provider Last Rate Last Admin   acetaminophen (TYLENOL) tablet 650 mg  650 mg Oral Q6H PRN Shela Leff, MD   650 mg at 03/13/22 0033   dicyclomine (BENTYL) capsule 10 mg  10 mg Oral TID AC Willia Craze, NP   10 mg at 03/13/22 1624   diltiazem 2 % gel   Topical TID Willia Craze, NP   Given at 03/13/22 1624   doxycycline (VIBRA-TABS) tablet 100 mg  100 mg Oral [address] [place], DO   100 mg at 03/13/22 0973   FLUoxetine (PROZAC) capsule 40 mg  40 mg Oral Daily Raiford Noble [place], DO   40 mg at 03/13/22 5329   gabapentin (NEURONTIN) capsule 300 mg  300 mg Oral BID Raiford Noble [place], DO   300 mg at 03/13/22 1337   methocarbamol (ROBAXIN) tablet 500 mg  500 mg Oral Q6H PRN Raiford Noble [place], DO       morphine (PF) 2 MG/ML injection 1 mg  1 mg Intravenous Q4H PRN Shela Leff, MD   1 mg at 03/12/22 2337   ondansetron (ZOFRAN) injection 4 mg  4 mg Intravenous  Q6H PRN Kathie Dike, MD   4 mg at 03/13/22 0955   zonisamide (ZONEGRAN) capsule 100 mg  100 mg Oral QHS Kerney Elbe, DO         Musculoskeletal: Strength & Muscle Tone: Unable to assess due to telemedicine visit [place]: Unable to assess due to telemedicine visit Patient leans: Unable to assess due to telemedicine visit  Psychiatric Specialty Exam: Review of Systems  Psychiatric/Behavioral:  Positive for sleep disturbance. Negative for decreased concentration, dysphoric mood, hallucinations, self-injury and suicidal ideas. The patient is nervous/anxious. The patient is not hyperactive.     There were no vitals taken for this visit.There is no height or weight on file to calculate BMI.  General Appearance: Unable to assess due to telemedicine visit  Eye Contact:  Unable to assess due to telemedicine visit  Speech:  Clear and Coherent and Normal Rate  Volume:  Normal  Mood:  Anxious and Depressed  Affect:  Congruent  Thought Process:  Coherent, Goal Directed, and Descriptions of Associations: Intact  Orientation:  Full (Time, Place, and Person)  Thought Content: WDL   Suicidal Thoughts:  No  Homicidal Thoughts:  No  Memory:  Immediate;   Good Recent;   Good Remote;   Good  Judgement:  Good  Insight:  Good  Psychomotor Activity:  Normal  Concentration:  Concentration: Good and Attention Span: Good  Recall:  Good  Fund of Knowledge: Good  Language: Good  Akathisia:  No  Handed:  Right  AIMS (if indicated): not done  Assets:  Communication Skills Desire for Improvement Housing Social Support Vocational/Educational  ADL's:  Intact  Cognition: WNL  Sleep:  Fair   Screenings: GAD-7    Flowsheet Row Video Visit from 03/13/2022 in [facility] Video Visit from 01/09/2022 in [facility] Video Visit from 11/07/2021 in [facility] Video Visit from 09/12/2021 in [facility] Office Visit from 08/01/2021 in [facility]  Total GAD-7 Score 20 18 17 13 19       PHQ2-9    Flowsheet Row Video Visit from 03/13/2022 in [facility] Video Visit from 01/09/2022 in [facility] Office Visit from  12/31/2021 in [place] Office Visit from 11/17/2021 in [place] Office Visit from 11/07/2021 in [place]  PHQ-2 Total Score 4 2 1 4 4   PHQ-9 Total Score 17 13 9 15 14       Flowsheet Row Video Visit from 03/13/2022 in [facility] ED to Hosp-Admission (Current) from 03/09/2022 in [facility] Video Visit from 01/09/2022 in [place] No Risk No Risk No Risk        Assessment and Plan:   Latash Nouri is a 21 year old female with a past psychiatric history significant for generalized anxiety disorder and major depressive disorder who presents to [facility] via virtual telephone visit for follow-up and medication management.  Patient states that she is currently in the hospital due to GI issues and pain stemming from her leg, back, and stomach.  Patient reports that this pain has been going on for a week.  She states that she was restarted on her Prozac while in the hospital but denies experiencing any changes in her mood.  Patient continues to endorse depression and anxiety and is awaiting results from the numerous tests that her providers have performed.  Patient states that she will go back to taking her medication once she sees results and is discharged from the hospital.  Collaboration of Care: Collaboration of Care: Medication Management AEB provider managing patient's medications, Primary Care Provider AEB patient being seen by a primary care provider, Psychiatrist AEB  patient being followed by a mental health provider, and Other provider involved in patient's care AEB patient is also being seen by OB/GYN  Patient/Guardian was advised Release of Information must be obtained prior to any record release in order to collaborate their care with an outside provider. Patient/Guardian was advised if they have not already done so to contact the registration department to sign all necessary forms in order for [place] to release information regarding their care.   Consent: Patient/Guardian gives verbal consent for treatment and assignment of benefits for services provided during this visit. Patient/Guardian expressed understanding and agreed to proceed.   1. Generalized anxiety disorder Patient to continue taking Prozac 40 mg daily for the management of her generalized anxiety disorder  2. Moderate episode of recurrent major depressive disorder [facility]) Patient to continue taking Prozac 40 mg daily for the management of her major depressive disorder  Patient to follow up in 2 months Provider spent a total of 13 minutes with the patient/reviewing patient's chart  Malachy Mood, PA 03/13/2022, 2:43 PM

## 2022-03-13 NOTE — Progress Notes (Signed)
PROGRESS NOTE    Jacqueline Gonzalez  VPX:[id number] DOB: 10/19/2000 DOA: 03/09/2022 PCP: Donney Dice, DO   Brief Narrative:  HPI per Dr. Shela Leff on 03/10/22 Jacqueline Gonzalez is a 21 y.o. female with medical history significant of anemia, anxiety, chlamydia, IBS-C, anal fissure, depression, GERD, hypertension presented to ED with complaints of fever, abdominal pain, back pain, and hematochezia.  Febrile and tachycardic on arrival to the ED.  Labs showing WBC 13.6, hemoglobin 10.0 (stable), lipase and LFTs normal, UA with signs of infection (large amount of leukocytes and greater than 50 WBCs), urine pregnancy test negative, urine culture pending, COVID and influenza PCR negative, wet prep negative.  Testing for gonorrhea, chlamydia, syphilis, and HIV pending.  Pelvic exam negative for cervical motion tenderness.  CT abdomen pelvis showing findings concerning for proctocolitis versus PID. Patient was given ceftriaxone, metronidazole, and Tylenol.   Patient reports 3-day history of abdominal pain which is generalized but worse in the lower abdomen, buttock pain/rectal pain, low back pain, and fevers.  She denies any urinary symptoms.  Denies any vaginal discharge.  Does report intermittent hematochezia for the past 3 weeks.  Unable to discuss sexual history at this time as patient's mother and friend at bedside.  She has no other complaints.   **Interim History GI was consulted and she continues to have some abdominal discomfort and pain.  Still awaiting some studies.  Pain is not really improving.  Slightly worse in the left quadrant than right.  GI considering flexible sigmoidoscopy if GYN studies are negative however she tested positive for chlamydia and is having likely PID and proctocolitis from the chlamydia.  She can planes of left buttock pain that radiates down her legs we will get an MRI of with and without contrast of the pelvis to evaluate for any abscesses related to her infection.   This could be sciatica so we have initiated gabapentin and muscle relaxants.   Assessment and Plan:  Sepsis secondary to proctocolitis versus PID and possible UTI; likely she has chlamydia PID and chlamydia proctocolitis -Febrile and tachycardic on arrival to the ED.   -WBC 13.6 and trending down and is now 7.8.   -No lactate checked and no blood cultures drawn in the ED.   -Diarrhea has resolved and she has not had a bowel movement 4 days for anorexic precautions have been discontinued given that C. difficile GI pathogen panel has not been collected; infectious colitis seems unlikely at this point given her lack of diarrhea but inflammatory bowel disease could be a possibility -GI has canceled her stool studies if she starts having diarrhea again they are recommending reordering -She continues to have bilateral lower abdominal pain so we will get a complete pelvic ultrasound -Her ultrasound pelvis was done and showed "Sonographic appearance of the ovaries can be seen in the setting of polycystic ovarian syndrome.  Normal ovarian blood flow.  The previous questioned endometrial polyp is not seen on the current exam. Moderate amount of free fluid in the pelvis, appearing simple." -Per GI if GYN studies are negative and lower abdominal pain persists she may have a flex sigmoidoscopy to evaluate rectosigmoid findings on the CT scan -She is given diltiazem gel for anal fissure and likely proctocolitis -UA done and showed hazy appearance with large leukocytes, negative nitrites, few bacteria, 0-5 RBCs per high-power field, 11-20 squamous epithelial cells, greater than 50 WBCs with urine culture showing multiple species and suggesting recollection -Blood cultures obtained on 03/10/2022 showing no growth to date  at 2 days but she spiked another temperature yesterday so blood cultures were repeated and pending -Wet prep done and negative -Continued ceftriaxone and metronidazole, add on doxycycline and  because she is positive for chlamydia we will discontinue the ceftriaxone metronidazole continue on doxycycline -RPR was nonreactive -IV fluid hydration with normal saline now stopped -Pain management with ibuprofen 400 mg x 1, morphine IV 1 mg every 4 hours as needed for severe pain -Continuing dicyclomine 10 mg 3 times daily before meals -Continue supportive care and antiemetics with ondansetron 4 mg every 6 hours as needed nausea vomiting -Testing for gonorrhea, chlamydia, syphilis, and HIV -HIV and RPR nonreactive as above and gonorrhea and Chlamydia testing done and she was positive for chlamydia -Monitor WBC count slowly trending down and finally normalized -GI feels that she has chlamydia proctitis versus pelvic inflammatory disease with associated inflammation and remains on doxycycline and given her current infection they are not we will plan endoscopic evaluation unless patient's symptoms do not improve and their plan is for outpatient evaluation in 4 to 6 weeks after discharge  Lower back/left buttock pain that radiates down the leg -In the setting of chlamydia proctocolitis however will need to rule out abscess or deeper infection could be also related to sciatica -Patient states it is worse with ambulation and she went to the vending machines and back and was in such excruciating pain that it brought her to tears -We will get an MRI of the pelvis with and without contrast -Initiate gabapentin and muscle relaxants -We will need to have close GYN follow-up   Chronic Normocytic Anemia -Hemoglobin/hematocrit went from 10.0/32.2 -> 9.3/29.5 -> 8.6/20.0 -> 8.5/28.1 and is now 9.1/29.9 -Check anemia panel in the a.m. -Continue monitor for signs and symptoms of bleeding; no overt bleeding noted   Anxiety and depression -Continue home medication after pharmacy med rec and will resume her home fluoxetine   Migraines -Recently seen at the headache clinic -Initiate zonisamide 100 mg p.o.  nightly dosing given to her by the headache specialist  Elevated D-dimer with chest discomfort -D-dimer was 1.93 -Given this he underwent a CTA of the chest to evaluate for PE and this was negative and also showed a normal examination   Hypokalemia -Patient's K+ went from 3.7 -> 3.1 -> 3.8 -Replete with po KCL 40 mEQ BID x2 -Checked Mag Level and was 1.9 -Continue to Monitor and Replete as Necessary -Repeat CMP in the AM    Obesity -Complicates overall prognosis and care -Estimated body mass index is 35.88 kg/m as calculated from the following:   Height as of this encounter: 5' 8"  (1.727 m).   Weight as of this encounter: 107 kg.  -Weight Loss and Dietary Counseling given  DVT prophylaxis: SCDs Start: 03/10/22 0408    Code Status: Full Code Family Communication: Discussed with family member at bedside as well as her mother over the telephone  Disposition Plan:  Level of care: Med-Surg Status is: Inpatient Remains inpatient appropriate because: Continues to have some left-sided buttock pain and some lower abdominal pain.  Also had a headache   Consultants:  Gastroenterology Discussed the case with radiologist Dr. Gus Rankin  Procedures:  See above  Antimicrobials:  Anti-infectives (From admission, onward)    Start     Dose/Rate Route Frequency Ordered Stop   03/12/22 2200  doxycycline (VIBRA-TABS) tablet 100 mg        100 mg Oral Every 12 hours 03/12/22 1420 03/24/22 0959   03/10/22 2300  cefTRIAXone (ROCEPHIN) 1 g in sodium chloride 0.9 % 100 mL IVPB  Status:  Discontinued        1 g 200 mL/hr over 30 Minutes Intravenous Every 24 hours 03/10/22 0409 03/10/22 1159   03/10/22 2300  cefTRIAXone (ROCEPHIN) 2 g in sodium chloride 0.9 % 100 mL IVPB  Status:  Discontinued        2 g 200 mL/hr over 30 Minutes Intravenous Every 24 hours 03/10/22 1159 03/12/22 1420   03/10/22 1100  metroNIDAZOLE (FLAGYL) IVPB 500 mg  Status:  Discontinued        500 mg 100 mL/hr over 60  Minutes Intravenous Every 12 hours 03/10/22 0409 03/12/22 1420   03/10/22 0500  doxycycline (VIBRAMYCIN) 100 mg in sodium chloride 0.9 % 250 mL IVPB  Status:  Discontinued        100 mg 125 mL/hr over 120 Minutes Intravenous Every 12 hours 03/10/22 0409 03/12/22 1420   03/09/22 2315  metroNIDAZOLE (FLAGYL) IVPB 500 mg        500 mg 100 mL/hr over 60 Minutes Intravenous  Once 03/09/22 2308 03/10/22 0720   03/09/22 2300  cefTRIAXone (ROCEPHIN) 1 g in sodium chloride 0.9 % 100 mL IVPB        1 g 200 mL/hr over 30 Minutes Intravenous  Once 03/09/22 2253 03/10/22 0029       Subjective: Seen and examined at bedside and was having some left buttock pain to the point where she states that when she puts her leg down and continues to have significant pain.  No nausea or vomiting.  States that she still has some abdominal discomfort.  No other concerns or plaints this time and just does not feel as well still.  Continues to have a headache.  Objective: Vitals:   03/12/22 2119 03/13/22 0033 03/13/22 0559 03/13/22 1327  BP: 119/69  114/62 124/82  Pulse: 97  90 94  Resp: 18  18 18   Temp: 99.8 F (37.7 C) 99.6 F (37.6 C) 97.7 F (36.5 C)   TempSrc: Oral  Oral   SpO2: 97%  97% 97%  Weight:      Height:        Intake/Output Summary (Last 24 hours) at 03/13/2022 1732 Last data filed at 03/13/2022 1000 Gross per 24 hour  Intake 120 ml  Output 1520 ml  Net -1400 ml   Filed Weights   03/09/22 1733  Weight: 107 kg   Examination: Physical Exam:  Constitutional: WN/WD obese African-American female currently in no acute distress but appears little uncomfortable Respiratory: Diminished to auscultation bilaterally with coarse breath sounds, no wheezing, rales, rhonchi or crackles. Normal respiratory effort and patient is not tachypenic. No accessory muscle use.  Unlabored breathing and not wearing supplemental oxygen nasal cannula Cardiovascular: RRR, no murmurs / rubs / gallops. S1 and S2  auscultated. No extremity edema.  Abdomen: Soft, slightly tender, distended secondary body habitus. Bowel sounds positive.  GU: Deferred. Musculoskeletal: No clubbing / cyanosis of digits/nails. Normal strength and muscle tone.  Skin: No rashes, lesions, ulcers. No induration; Warm and dry.  Neurologic: CN 2-12 grossly intact with no focal deficits.  Romberg sign and cerebellar reflexes not assessed.  Psychiatric: Normal judgment and insight. Alert and oriented x 3. Normal mood and appropriate affect.   Data Reviewed: I have personally reviewed following labs and imaging studies  CBC: Recent Labs  Lab 03/09/22 1745 03/10/22 0431 03/11/22 0035 03/12/22 0807 03/13/22 0441  WBC 13.6* 12.3* 11.7* 8.7 7.8  NEUTROABS  --   --   --  5.8 4.6  HGB 10.0* 9.3* 8.6* 8.5* 9.1*  HCT 32.2* 29.5* 28.0* 28.1* 29.9*  MCV 78.7* 81.5 81.4 82.6 81.0  PLT 234 205 204 253 007   Basic Metabolic Panel: Recent Labs  Lab 03/09/22 1745 03/11/22 0035 03/11/22 0517 03/12/22 0521 03/13/22 0441  NA 133* 137  --  139 141  K 3.7 3.1*  --  3.8 3.8  CL 102 107  --  111 113*  CO2 21* 22  --  20* 24  GLUCOSE 99 95  --  100* 93  BUN <5* 5*  --  5* <5*  CREATININE 0.88 0.85  --  0.77 0.80  CALCIUM 8.8* 7.9*  --  8.2* 8.5*  MG  --   --  1.8 1.9 2.0  PHOS  --   --  2.5 2.7 4.2   GFR: Estimated Creatinine Clearance: 143.6 mL/min (by C-G formula based on SCr of 0.8 mg/dL). Liver Function Tests: Recent Labs  Lab 03/09/22 1745 03/11/22 0035 03/12/22 0521 03/13/22 0441  AST 19 21 17 20   ALT 23 28 21 19   ALKPHOS 76 71 70 62  BILITOT 0.5 0.4 0.4 0.5  PROT 8.1 7.0 6.7 6.9  ALBUMIN 3.8 2.7* 2.8* 2.8*   Recent Labs  Lab 03/09/22 1745  LIPASE <10*   No results for input(s): "AMMONIA" in the last 168 hours. Coagulation Profile: No results for input(s): "INR", "PROTIME" in the last 168 hours. Cardiac Enzymes: No results for input(s): "CKTOTAL", "CKMB", "CKMBINDEX", "TROPONINI" in the last 168  hours. BNP (last 3 results) No results for input(s): "PROBNP" in the last 8760 hours. HbA1C: No results for input(s): "HGBA1C" in the last 72 hours. CBG: No results for input(s): "GLUCAP" in the last 168 hours. Lipid Profile: No results for input(s): "CHOL", "HDL", "LDLCALC", "TRIG", "CHOLHDL", "LDLDIRECT" in the last 72 hours. Thyroid Function Tests: No results for input(s): "TSH", "T4TOTAL", "FREET4", "T3FREE", "THYROIDAB" in the last 72 hours. Anemia Panel: No results for input(s): "VITAMINB12", "FOLATE", "FERRITIN", "TIBC", "IRON", "RETICCTPCT" in the last 72 hours. Sepsis Labs: Recent Labs  Lab 03/10/22 1208  LATICACIDVEN 1.2    Recent Results (from the past 240 hour(s))  Resp Panel by RT-PCR (Flu A&B, Covid) Anterior Nasal Swab     Status: None   Collection Time: 03/09/22  5:45 PM   Specimen: Anterior Nasal Swab  Result Value Ref Range Status   SARS Coronavirus 2 by RT PCR NEGATIVE NEGATIVE Final    Comment: (NOTE) SARS-CoV-2 target nucleic acids are NOT DETECTED.  The SARS-CoV-2 RNA is generally detectable in upper respiratory specimens during the acute phase of infection. The lowest concentration of SARS-CoV-2 viral copies this assay can detect is 138 copies/mL. A negative result does not preclude SARS-Cov-2 infection and should not be used as the sole basis for treatment or other patient management decisions. A negative result may occur with  improper specimen collection/handling, submission of specimen other than nasopharyngeal swab, presence of viral mutation(s) within the areas targeted by this assay, and inadequate number of viral copies(<138 copies/mL). A negative result must be combined with clinical observations, patient history, and epidemiological information. The expected result is Negative.  Fact Sheet for Patients:  EntrepreneurPulse.com.au  Fact Sheet for Healthcare Providers:  IncredibleEmployment.be  This test  is no t yet approved or cleared by the [place] FDA and  has been authorized for detection and/or diagnosis of SARS-CoV-2 by FDA under an Emergency Use Authorization (EUA).  This EUA will remain  in effect (meaning this test can be used) for the duration of the COVID-19 declaration under Section 564(b)(1) of the Act, 21 U.S.C.section 360bbb-3(b)(1), unless the authorization is terminated  or revoked sooner.       Influenza A by PCR NEGATIVE NEGATIVE Final   Influenza B by PCR NEGATIVE NEGATIVE Final    Comment: (NOTE) The Xpert Xpress SARS-CoV-2/FLU/RSV plus assay is intended as an aid in the diagnosis of influenza from Nasopharyngeal swab specimens and should not be used as a sole basis for treatment. Nasal washings and aspirates are unacceptable for Xpert Xpress SARS-CoV-2/FLU/RSV testing.  Fact Sheet for Patients: EntrepreneurPulse.com.au  Fact Sheet for Healthcare Providers: IncredibleEmployment.be  This test is not yet approved or cleared by the [place] FDA and has been authorized for detection and/or diagnosis of SARS-CoV-2 by FDA under an Emergency Use Authorization (EUA). This EUA will remain in effect (meaning this test can be used) for the duration of the COVID-19 declaration under Section 564(b)(1) of the Act, 21 U.S.C. section 360bbb-3(b)(1), unless the authorization is terminated or revoked.  Performed at [facility], [address], [place], [place] [address]   Urine Culture     Status: Abnormal   Collection Time: 03/09/22 10:37 PM   Specimen: Urine, Clean Catch  Result Value Ref Range Status   Specimen Description   Final    URINE, CLEAN CATCH Performed at [facility], [address] [place], [place] [address]    Special Requests   Final    NONE Performed at [facility], [address] [place], [place] [address]    Culture MULTIPLE SPECIES  PRESENT, SUGGEST RECOLLECTION (A)  Final   Report Status 03/11/2022 FINAL  Final  Wet prep, genital     Status: None   Collection Time: 03/09/22 10:37 PM   Specimen: PATH Cytology Cervicovaginal Ancillary Only  Result Value Ref Range Status   Yeast Wet Prep HPF POC NONE SEEN NONE SEEN Final   Trich, Wet Prep NONE SEEN NONE SEEN Final   Clue Cells Wet Prep HPF POC NONE SEEN NONE SEEN Final   WBC, Wet Prep HPF POC <10 <10 Final   Sperm NONE SEEN  Final    Comment: Performed at [facility], [address], [place], [place] [address]  Culture, blood (Routine X 2) w Reflex to ID Panel     Status: None (Preliminary result)   Collection Time: 03/10/22 11:55 AM   Specimen: BLOOD  Result Value Ref Range Status   Specimen Description   Final    BLOOD LEFT ANTECUBITAL Performed at [facility] [address], [place], [place] [address]    Special Requests   Final    BOTTLES DRAWN AEROBIC AND ANAEROBIC Blood Culture results may not be optimal due to an inadequate volume of blood received in culture bottles Performed at [facility] [address] [place], [place] [address]    Culture   Final    NO GROWTH 3 DAYS Performed at [facility], [place] [address] [place],  [address]    Report Status PENDING  Incomplete  Culture, blood (Routine X 2) w Reflex to ID Panel     Status: None (Preliminary result)   Collection Time: 03/10/22 12:07 PM   Specimen: BLOOD  Result Value Ref Range Status   Specimen Description   Final    BLOOD LEFT ANTECUBITAL Performed at [facility]  [address], [place], [place] [address]    Special Requests   Final    BOTTLES DRAWN AEROBIC AND ANAEROBIC Blood Culture results may not be optimal due to an inadequate volume of blood received in culture bottles Performed at [facility] [address], [place], [place] [address]    Culture   Final    NO GROWTH  3 DAYS Performed at [facility], [place] [address] [place], [place] [address]    Report Status PENDING  Incomplete  Culture, blood (Routine X 2) w Reflex to ID Panel     Status: None (Preliminary result)   Collection Time: 03/11/22  7:14 PM   Specimen: BLOOD  Result Value Ref Range Status   Specimen Description   Final    BLOOD BLOOD RIGHT ARM Performed at [facility] [address] [place], [place] [address]    Special Requests   Final    IN PEDIATRIC BOTTLE Blood Culture adequate volume Performed at [facility] [address] [place], [place] [address]    Culture   Final    NO GROWTH 2 DAYS Performed at [facility] [address], [place], [place] [address]    Report Status PENDING  Incomplete  Culture, blood (Routine X 2) w Reflex to ID Panel     Status: None (Preliminary result)   Collection Time: 03/12/22  5:21 AM   Specimen: BLOOD  Result Value Ref Range Status   Specimen Description   Final    BLOOD BLOOD RIGHT ARM Performed at [facility] [address], [place], [place] [address]    Special Requests   Final    BOTTLES DRAWN AEROBIC AND ANAEROBIC Blood Culture adequate volume Performed at [facility] [address] [place], [place] [address]    Culture   Final    NO GROWTH 1 DAY Performed at [facility], [place] [address] [place],  [address]    Report Status PENDING  Incomplete    Radiology Studies: No results found.  Scheduled Meds:  dicyclomine  10 mg Oral TID AC   diltiazem   Topical TID   doxycycline  100 mg Oral Q12H   FLUoxetine  40 mg Oral Daily   gabapentin  300 mg Oral BID   zonisamide  100 mg Oral QHS   Continuous Infusions:   LOS: 3 days   Raiford Noble, DO [facility] Available via Epic secure chat 7am-7pm After these hours, please refer to coverage provider listed on [email or web address] 03/13/2022, 5:32 PM

## 2022-03-14 ENCOUNTER — Inpatient Hospital Stay (HOSPITAL_COMMUNITY): Payer: Medicaid Other | Admitting: Certified Registered Nurse Anesthetist

## 2022-03-14 ENCOUNTER — Encounter (HOSPITAL_COMMUNITY)
Admission: EM | Disposition: A | Discharge status: Home / Self Care | Payer: Self-pay | Source: Home / Self Care | Attending: Internal Medicine

## 2022-03-14 ENCOUNTER — Other Ambulatory Visit: Payer: Self-pay

## 2022-03-14 ENCOUNTER — Encounter (HOSPITAL_COMMUNITY): Payer: Self-pay | Admitting: Internal Medicine

## 2022-03-14 DIAGNOSIS — K611 Rectal abscess: Secondary | ICD-10-CM

## 2022-03-14 DIAGNOSIS — K602 Anal fissure, unspecified: Secondary | ICD-10-CM | POA: Diagnosis not present

## 2022-03-14 DIAGNOSIS — K529 Noninfective gastroenteritis and colitis, unspecified: Secondary | ICD-10-CM | POA: Diagnosis not present

## 2022-03-14 DIAGNOSIS — R933 Abnormal findings on diagnostic imaging of other parts of digestive tract: Secondary | ICD-10-CM | POA: Diagnosis not present

## 2022-03-14 DIAGNOSIS — D649 Anemia, unspecified: Secondary | ICD-10-CM | POA: Diagnosis not present

## 2022-03-14 HISTORY — PX: INCISION AND DRAINAGE PERIRECTAL ABSCESS: SHX1804

## 2022-03-14 LAB — CBC WITH DIFFERENTIAL/PLATELET
Abs Immature Granulocytes: 0.26 K/uL — ABNORMAL HIGH (ref 0.00–0.07)
Basophils Absolute: 0 K/uL (ref 0.0–0.1)
Basophils Relative: 0 %
Eosinophils Absolute: 0.2 K/uL (ref 0.0–0.5)
Eosinophils Relative: 2 %
HCT: 29.4 % — ABNORMAL LOW (ref 36.0–46.0)
Hemoglobin: 9 g/dL — ABNORMAL LOW (ref 12.0–15.0)
Immature Granulocytes: 3 %
Lymphocytes Relative: 28 %
Lymphs Abs: 2.5 K/uL (ref 0.7–4.0)
MCH: 24.9 pg — ABNORMAL LOW (ref 26.0–34.0)
MCHC: 30.6 g/dL (ref 30.0–36.0)
MCV: 81.4 fL (ref 80.0–100.0)
Monocytes Absolute: 0.8 K/uL (ref 0.1–1.0)
Monocytes Relative: 9 %
Neutro Abs: 5.1 K/uL (ref 1.7–7.7)
Neutrophils Relative %: 58 %
Platelets: 324 K/uL (ref 150–400)
RBC: 3.61 MIL/uL — ABNORMAL LOW (ref 3.87–5.11)
RDW: 16 % — ABNORMAL HIGH (ref 11.5–15.5)
WBC: 8.9 K/uL (ref 4.0–10.5)
nRBC: 0.3 % — ABNORMAL HIGH (ref 0.0–0.2)

## 2022-03-14 LAB — COMPREHENSIVE METABOLIC PANEL WITH GFR
ALT: 21 U/L (ref 0–44)
AST: 25 U/L (ref 15–41)
Albumin: 2.7 g/dL — ABNORMAL LOW (ref 3.5–5.0)
Alkaline Phosphatase: 60 U/L (ref 38–126)
Anion gap: 10 (ref 5–15)
BUN: 5 mg/dL — ABNORMAL LOW (ref 6–20)
CO2: 22 mmol/L (ref 22–32)
Calcium: 8.3 mg/dL — ABNORMAL LOW (ref 8.9–10.3)
Chloride: 108 mmol/L (ref 98–111)
Creatinine, Ser: 0.81 mg/dL (ref 0.44–1.00)
GFR, Estimated: >60 mL/min
Glucose, Bld: 115 mg/dL — ABNORMAL HIGH (ref 70–99)
Potassium: 3.5 mmol/L (ref 3.5–5.1)
Sodium: 140 mmol/L (ref 135–145)
Total Bilirubin: 0.2 mg/dL — ABNORMAL LOW (ref 0.3–1.2)
Total Protein: 6.8 g/dL (ref 6.5–8.1)

## 2022-03-14 LAB — PHOSPHORUS: Phosphorus: 4.9 mg/dL — ABNORMAL HIGH (ref 2.5–4.6)

## 2022-03-14 LAB — RETICULOCYTES
Immature Retic Fract: 44.2 % — ABNORMAL HIGH (ref 2.3–15.9)
RBC.: 3.61 MIL/uL — ABNORMAL LOW (ref 3.87–5.11)
Retic Count, Absolute: 35.7 10*3/uL (ref 19.0–186.0)
Retic Ct Pct: 1 % (ref 0.4–3.1)

## 2022-03-14 LAB — MAGNESIUM: Magnesium: 1.9 mg/dL (ref 1.7–2.4)

## 2022-03-14 LAB — IRON AND TIBC
Iron: 29 ug/dL (ref 28–170)
Saturation Ratios: 11 % (ref 10.4–31.8)
TIBC: 260 ug/dL (ref 250–450)
UIBC: 231 ug/dL

## 2022-03-14 LAB — VITAMIN B12: Vitamin B-12: 277 pg/mL (ref 180–914)

## 2022-03-14 LAB — FERRITIN: Ferritin: 91 ng/mL (ref 11–307)

## 2022-03-14 LAB — FOLATE: Folate: 10.5 ng/mL

## 2022-03-14 SURGERY — INCISION AND DRAINAGE, ABSCESS, PERIRECTAL
Anesthesia: General | Site: Rectum

## 2022-03-14 MED ORDER — LIP MEDEX EX OINT
TOPICAL_OINTMENT | Freq: Two times a day (BID) | CUTANEOUS | Status: DC
  Administered 2022-03-14 – 2022-03-15 (×2): 75 via TOPICAL
  Filled 2022-03-14: qty 7

## 2022-03-14 MED ORDER — PIPERACILLIN-TAZOBACTAM 3.375 G IVPB
3.3750 g | Freq: Three times a day (TID) | INTRAVENOUS | Status: DC
  Administered 2022-03-14 – 2022-03-16 (×7): 3.375 g via INTRAVENOUS
  Filled 2022-03-14 (×7): qty 50

## 2022-03-14 MED ORDER — LACTATED RINGERS IV BOLUS: 1000.0000 mL | Freq: Three times a day (TID) | INTRAVENOUS | Status: DC | PRN

## 2022-03-14 MED ORDER — PROPOFOL 10 MG/ML IV BOLUS
INTRAVENOUS | Status: DC | PRN
  Administered 2022-03-14: 200 mg via INTRAVENOUS

## 2022-03-14 MED ORDER — PHENYLEPHRINE 80 MCG/ML (10ML) SYRINGE FOR IV PUSH (FOR BLOOD PRESSURE SUPPORT)
PREFILLED_SYRINGE | INTRAVENOUS | Status: DC | PRN
  Administered 2022-03-14 (×2): 80 ug via INTRAVENOUS

## 2022-03-14 MED ORDER — SODIUM CHLORIDE 0.9% FLUSH: 3.0000 mL | Freq: Two times a day (BID) | INTRAVENOUS | Status: DC

## 2022-03-14 MED ORDER — FENTANYL CITRATE PF 50 MCG/ML IJ SOSY
PREFILLED_SYRINGE | INTRAMUSCULAR | Status: AC
  Filled 2022-03-14: qty 1

## 2022-03-14 MED ORDER — SODIUM CHLORIDE 0.9 % IV SOLN: 2.0000 g | INTRAVENOUS | Status: DC

## 2022-03-14 MED ORDER — PROPOFOL 10 MG/ML IV BOLUS
INTRAVENOUS | Status: AC
  Filled 2022-03-14: qty 20

## 2022-03-14 MED ORDER — 0.9 % SODIUM CHLORIDE (POUR BTL) OPTIME
TOPICAL | Status: DC | PRN
  Administered 2022-03-14: 1000 mL

## 2022-03-14 MED ORDER — POLYETHYLENE GLYCOL 3350 17 G PO PACK
34.0000 g | PACK | Freq: Two times a day (BID) | ORAL | Status: DC
  Administered 2022-03-14 – 2022-03-16 (×4): 34 g via ORAL
  Filled 2022-03-14 (×4): qty 2

## 2022-03-14 MED ORDER — BUPIVACAINE LIPOSOME 1.3 % IJ SUSP
INTRAMUSCULAR | Status: AC
  Filled 2022-03-14: qty 20

## 2022-03-14 MED ORDER — LIDOCAINE HCL (PF) 2 % IJ SOLN
INTRAMUSCULAR | Status: AC
  Filled 2022-03-14: qty 5

## 2022-03-14 MED ORDER — DEXAMETHASONE SODIUM PHOSPHATE 10 MG/ML IJ SOLN
INTRAMUSCULAR | Status: AC
  Filled 2022-03-14: qty 1

## 2022-03-14 MED ORDER — OXYCODONE HCL 5 MG PO TABS: 5.0000 mg | ORAL_TABLET | Freq: Once | ORAL | Status: DC | PRN

## 2022-03-14 MED ORDER — ACETAMINOPHEN 500 MG PO TABS
1000.0000 mg | ORAL_TABLET | ORAL | Status: AC
  Administered 2022-03-14: 1000 mg via ORAL
  Filled 2022-03-14: qty 2

## 2022-03-14 MED ORDER — BUPIVACAINE-EPINEPHRINE (PF) 0.5% -1:200000 IJ SOLN
INTRAMUSCULAR | Status: AC
  Filled 2022-03-14: qty 30

## 2022-03-14 MED ORDER — CHLORHEXIDINE GLUCONATE CLOTH 2 % EX PADS: 6.0000 | MEDICATED_PAD | Freq: Once | CUTANEOUS | Status: DC

## 2022-03-14 MED ORDER — SODIUM CHLORIDE 0.9% FLUSH: 3.0000 mL | INTRAVENOUS | Status: DC | PRN

## 2022-03-14 MED ORDER — DEXAMETHASONE SODIUM PHOSPHATE 10 MG/ML IJ SOLN
INTRAMUSCULAR | Status: DC | PRN
  Administered 2022-03-14: 8 mg via INTRAVENOUS

## 2022-03-14 MED ORDER — PHENYLEPHRINE 80 MCG/ML (10ML) SYRINGE FOR IV PUSH (FOR BLOOD PRESSURE SUPPORT)
PREFILLED_SYRINGE | INTRAVENOUS | Status: AC
  Filled 2022-03-14: qty 10

## 2022-03-14 MED ORDER — METHOCARBAMOL 1000 MG/10ML IJ SOLN: 1000.0000 mg | Freq: Four times a day (QID) | INTRAVENOUS | Status: DC | PRN

## 2022-03-14 MED ORDER — ONDANSETRON HCL 4 MG/2ML IJ SOLN
INTRAMUSCULAR | Status: DC | PRN
  Administered 2022-03-14: 4 mg via INTRAVENOUS

## 2022-03-14 MED ORDER — MAGIC MOUTHWASH: 15.0000 mL | Freq: Four times a day (QID) | ORAL | Status: DC | PRN

## 2022-03-14 MED ORDER — MIDAZOLAM HCL 2 MG/2ML IJ SOLN
INTRAMUSCULAR | Status: AC
  Filled 2022-03-14: qty 2

## 2022-03-14 MED ORDER — POLYETHYLENE GLYCOL 3350 17 G PO PACK: 17.0000 g | PACK | Freq: Two times a day (BID) | ORAL | Status: DC | PRN

## 2022-03-14 MED ORDER — OXYCODONE HCL 5 MG/5ML PO SOLN: 5.0000 mg | Freq: Once | ORAL | Status: DC | PRN

## 2022-03-14 MED ORDER — CEFOTETAN DISODIUM 2 G IJ SOLR
INTRAMUSCULAR | Status: AC
  Filled 2022-03-14: qty 2

## 2022-03-14 MED ORDER — ONDANSETRON HCL 4 MG/2ML IJ SOLN
INTRAMUSCULAR | Status: AC
Start: 2022-03-14 — End: ?
  Filled 2022-03-14: qty 2

## 2022-03-14 MED ORDER — MIDAZOLAM HCL 2 MG/2ML IJ SOLN
INTRAMUSCULAR | Status: DC | PRN
  Administered 2022-03-14: 2 mg via INTRAVENOUS

## 2022-03-14 MED ORDER — ACETAMINOPHEN 500 MG PO TABS
1000.0000 mg | ORAL_TABLET | Freq: Four times a day (QID) | ORAL | Status: DC
  Administered 2022-03-14 – 2022-03-16 (×6): 1000 mg via ORAL
  Filled 2022-03-14 (×8): qty 2

## 2022-03-14 MED ORDER — BUPIVACAINE LIPOSOME 1.3 % IJ SUSP
20.0000 mL | Freq: Once | INTRAMUSCULAR | Status: AC
  Administered 2022-03-14: 20 mL

## 2022-03-14 MED ORDER — LIDOCAINE HCL (CARDIAC) PF 100 MG/5ML IV SOSY
PREFILLED_SYRINGE | INTRAVENOUS | Status: DC | PRN
  Administered 2022-03-14: 100 mg via INTRAVENOUS

## 2022-03-14 MED ORDER — SODIUM CHLORIDE 0.9 % IV SOLN: 8.0000 mg | Freq: Four times a day (QID) | INTRAVENOUS | Status: DC | PRN

## 2022-03-14 MED ORDER — FENTANYL CITRATE PF 50 MCG/ML IJ SOSY
25.0000 ug | PREFILLED_SYRINGE | INTRAMUSCULAR | Status: DC | PRN
  Administered 2022-03-14: 50 ug via INTRAVENOUS

## 2022-03-14 MED ORDER — GABAPENTIN 300 MG PO CAPS
300.0000 mg | ORAL_CAPSULE | ORAL | Status: AC
  Administered 2022-03-14: 300 mg via ORAL
  Filled 2022-03-14: qty 1

## 2022-03-14 MED ORDER — FENTANYL CITRATE (PF) 250 MCG/5ML IJ SOLN
INTRAMUSCULAR | Status: AC
  Filled 2022-03-14: qty 5

## 2022-03-14 MED ORDER — ONDANSETRON HCL 4 MG/2ML IJ SOLN
4.0000 mg | Freq: Four times a day (QID) | INTRAMUSCULAR | Status: DC | PRN
  Administered 2022-03-16: 4 mg via INTRAVENOUS

## 2022-03-14 MED ORDER — PHENOL 1.4 % MT LIQD: 2.0000 | OROMUCOSAL | Status: DC | PRN

## 2022-03-14 MED ORDER — OXYCODONE HCL 5 MG PO TABS
5.0000 mg | ORAL_TABLET | ORAL | Status: DC | PRN
  Administered 2022-03-14: 10 mg via ORAL
  Filled 2022-03-14: qty 2

## 2022-03-14 MED ORDER — ONDANSETRON HCL 4 MG/2ML IJ SOLN: 4.0000 mg | Freq: Four times a day (QID) | INTRAMUSCULAR | Status: DC | PRN

## 2022-03-14 MED ORDER — BUPIVACAINE-EPINEPHRINE (PF) 0.5% -1:200000 IJ SOLN
INTRAMUSCULAR | Status: DC | PRN
  Administered 2022-03-14: 30 mL

## 2022-03-14 MED ORDER — ASPIRIN-ACETAMINOPHEN-CAFFEINE 250-250-65 MG PO TABS: 2.0000 | ORAL_TABLET | Freq: Four times a day (QID) | ORAL | Status: DC | PRN

## 2022-03-14 MED ORDER — PROCHLORPERAZINE EDISYLATE 10 MG/2ML IJ SOLN: 5.0000 mg | INTRAMUSCULAR | Status: DC | PRN

## 2022-03-14 MED ORDER — MENTHOL 3 MG MT LOZG
1.0000 | LOZENGE | OROMUCOSAL | Status: DC | PRN
  Filled 2022-03-14: qty 9

## 2022-03-14 MED ORDER — FENTANYL CITRATE (PF) 250 MCG/5ML IJ SOLN
INTRAMUSCULAR | Status: DC | PRN
  Administered 2022-03-14 (×3): 50 ug via INTRAVENOUS

## 2022-03-14 MED ORDER — ALUM & MAG HYDROXIDE-SIMETH 200-200-20 MG/5ML PO SUSP: 30.0000 mL | Freq: Four times a day (QID) | ORAL | Status: DC | PRN

## 2022-03-14 MED ORDER — SODIUM CHLORIDE 0.9 % IV SOLN: 250.0000 mL | INTRAVENOUS | Status: DC | PRN

## 2022-03-14 MED ORDER — LACTATED RINGERS IV SOLN: INTRAVENOUS | Status: DC | PRN

## 2022-03-14 MED ORDER — CELECOXIB 200 MG PO CAPS
200.0000 mg | ORAL_CAPSULE | ORAL | Status: AC
  Administered 2022-03-14: 200 mg via ORAL
  Filled 2022-03-14: qty 1

## 2022-03-14 MED ORDER — LORATADINE 10 MG PO TABS
10.0000 mg | ORAL_TABLET | Freq: Every day | ORAL | Status: DC
  Administered 2022-03-14 – 2022-03-16 (×3): 10 mg via ORAL
  Filled 2022-03-14 (×3): qty 1

## 2022-03-14 MED ORDER — DIPHENHYDRAMINE HCL 50 MG/ML IJ SOLN: 12.5000 mg | Freq: Four times a day (QID) | INTRAMUSCULAR | Status: DC | PRN

## 2022-03-14 SURGICAL SUPPLY — 32 items
BAG COUNTER SPONGE SURGICOUNT (BAG) IMPLANT
BLADE SURG 15 STRL LF DISP TIS (BLADE) ×1 IMPLANT
BLADE SURG 15 STRL SS (BLADE) ×1
BRIEF MESH DISP LRG (UNDERPADS AND DIAPERS) ×1 IMPLANT
COVER SURGICAL LIGHT HANDLE (MISCELLANEOUS) ×1 IMPLANT
DRAPE LAPAROTOMY T 102X78X121 (DRAPES) ×1 IMPLANT
DRSG PAD ABDOMINAL 8X10 ST (GAUZE/BANDAGES/DRESSINGS) ×1 IMPLANT
ELECT REM PT RETURN 15FT ADLT (MISCELLANEOUS) ×1 IMPLANT
GAUZE 4X4 16PLY ~~LOC~~+RFID DBL (SPONGE) ×1 IMPLANT
GAUZE SPONGE 4X4 12PLY STRL (GAUZE/BANDAGES/DRESSINGS) ×1 IMPLANT
GLOVE ECLIPSE 8.0 STRL XLNG CF (GLOVE) ×1 IMPLANT
GLOVE INDICATOR 8.0 STRL GRN (GLOVE) ×1 IMPLANT
GOWN STRL REUS W/ TWL XL LVL3 (GOWN DISPOSABLE) ×3 IMPLANT
GOWN STRL REUS W/TWL XL LVL3 (GOWN DISPOSABLE) ×3
KIT BASIN OR (CUSTOM PROCEDURE TRAY) ×1 IMPLANT
KIT TURNOVER KIT A (KITS) IMPLANT
NEEDLE HYPO 22GX1.5 SAFETY (NEEDLE) ×1 IMPLANT
PACK BASIC VI WITH GOWN DISP (CUSTOM PROCEDURE TRAY) ×1 IMPLANT
PENCIL SMOKE EVACUATOR (MISCELLANEOUS) IMPLANT
SUCTION FRAZIER HANDLE 12FR (TUBING)
SUCTION TUBE FRAZIER 12FR DISP (TUBING) IMPLANT
SURGILUBE 2OZ TUBE FLIPTOP (MISCELLANEOUS) ×1 IMPLANT
SUT CHROMIC 2 0 SH (SUTURE) IMPLANT
SUT CHROMIC 3 0 SH 27 (SUTURE) IMPLANT
SUT VIC AB 2-0 UR6 27 (SUTURE) IMPLANT
SWAB COLLECTION DEVICE MRSA (MISCELLANEOUS) IMPLANT
SWAB CULTURE ESWAB REG 1ML (MISCELLANEOUS) IMPLANT
SYR 20ML LL LF (SYRINGE) ×1 IMPLANT
SYR 3ML LL SCALE MARK (SYRINGE) IMPLANT
SYR BULB IRRIG 60ML STRL (SYRINGE) IMPLANT
TOWEL OR 17X26 10 PK STRL BLUE (TOWEL DISPOSABLE) ×1 IMPLANT
TOWEL OR NON WOVEN STRL DISP B (DISPOSABLE) ×1 IMPLANT

## 2022-03-14 NOTE — Consult Note (Signed)
Jacqueline Gonzalez  Nov 23, 2000 [id number]  CARE TEAM:  PCP: Donney Dice, DO  Outpatient Care Team: Patient Care Team: Donney Dice, DO as PCP - General (Family Medicine) Greg Cutter, LCSW as [place] Management (Licensed Clinical Social Worker)  Inpatient Treatment Team: Treatment Team: Attending Provider: Kerney Elbe, [place]; Rounding Team: Fatima Blank, MD; Utilization Review: Alease Medina, RN; Registered Nurse: Marlis Edelson, RN; Pharmacist: Eudelia Bunch [facility]; Consulting Physician: Nolon Nations, MD; Social Worker: Rodney Booze, LCSW   This patient is a 21 y.o.female who presents today for surgical evaluation at the request of Dr Alfredia Ferguson.   Chief complaint / Reason for evaluation: Severe anorectal pain with abscess.  21 year old female with severe constipation.  Question of anal abscess.  Question of fissure in the past.  Had intermittent hematochezia.  Felt worsening pain and bleeding with leukocytosis.  Worse pain has focused around the anus radiating down her left buttock and leg.  Concern for inflammation and possible chlamydia PID.  Question of proctocolitis.  Placed on antibiotics.  Some improvement still with persistent pain.  MRI ordered which shows now a loculated abscess probably super sphincteric.  Patient still with significant pain on the left side.  No nausea or vomiting.  Family at bedside.  Denies any history of Crohn's or colitis or inflammatory bowel disease.   Assessment  Jacqueline Gonzalez  21 y.o. female       Problem List:  Principal Problem:   Proctocolitis Active Problems:   Generalized anxiety disorder   Chronic anemia   UTI (urinary tract infection)   Rectal bleeding   Anal fissure   Abnormal CT scan, colon   Rectal pain   Left-sided anorectal abscess with severe pain.  No definite evidence of fistula on exam.  Plan:  Continue IV antibiotics.  We do IV Zosyn.  Urgent anorectal examination incision  with probable abscess drainage and seton placement depending on what is found.  Perhaps biopsies of the rectum to rule out Crohn's disease.  However most likely this is a high stercoral perirectal abscess.  The anatomy and physiology of the region was discussed. The pathophysiology of subcutaneous abscess formation with progression to fasciitis & sepsis was discussed.  Need for incision, drainage, debridement discussed.  I stressed good hygiene & need for repeated wound care.  Possible redebridement / reoperation was discussed as well. Possibility of recurrence was discussed.   Risks of bleeding, infection, abscess, leak, injury to other organs, need for repair of tissues / organs, need for further treatment, heart attack, death, and other risks were discussed.  Benefits, alternatives were discussed. I noted a good likelihood this will help address the problem.  Questions answered.  The patient agrees to proceed.   Strongly recommended fiber bowel regimen to prevent future problems.  Most likely needs some type of fiber twice a day to override her severe constipation.  -VTE prophylaxis- SCDs, etc -mobilize as tolerated to help recovery  I reviewed nursing notes, Consultant GI notes, hospitalist notes, last 24 h vitals and pain scores, last 48 h intake and output, last 24 h labs and trends, and last 24 h imaging results. I have reviewed this patient's available data, including medical history, events of note, test results, etc as part of my evaluation.  A significant portion of that time was spent in counseling.  Care during the described time interval was provided by me.  This care required moderate level of medical decision making.  03/14/2022  Adin Hector, MD, FACS, MASCRS Esophageal, Gastrointestinal & Colorectal Surgery Robotic and Minimally Invasive Surgery  [facility] [address], [place], [place] [address] [phone] Fax [phone] Main  CONTACT INFORMATION:  Weekday (9AM-5PM): Call [facility] main office at [phone]  Weeknight (5PM-9AM) or Weekend/Holiday: Check [email or web address] (password " [email or web address]") for General Surgery [facility] coverage  (Please, do not use SecureChat as it is not reliable communication to reach operating surgeons for immediate patient care)      03/14/2022      Past Medical History:  Diagnosis Date   Anemia    Anxiety    Chlamydia 11/13/2021   Constipation, chronic    Depression    Environmental allergies    Ganglion cyst of dorsum of left wrist    RECURRENT   GERD (gastroesophageal reflux disease)    watches diet   Headache    Hypertension    IBS (irritable bowel syndrome)    Lactose intolerance     Past Surgical History:  Procedure Laterality Date   GANGLION CYST EXCISION Left 04/24/2021   Procedure: left recurrent dorsal carpal ganglion excision;  Surgeon: [name], MD;  Location: [facility];  Service: Orthopedics;  Laterality: Left;   TONSILLECTOMY AND ADENOIDECTOMY     AGE 22   WISDOM TOOTH EXTRACTION  03/18/2021   WRIST GANGLION EXCISION Left 02/2020    Social History   Socioeconomic History   Marital status: Single    Spouse name: Not on file   Number of children: 0   Years of education: Not on file   Highest education level: Not on file  Occupational History   Occupation: student  Tobacco Use   Smoking status: Never   Smokeless tobacco: Never  Vaping Use   Vaping Use: Never used  Substance and Sexual Activity   Alcohol use: Never   Drug use: Never   Sexual activity: Yes    Birth control/protection: OCP  Other Topics Concern   Not on file  Social History Narrative   Not on file   Social Determinants of Health   Financial Resource Strain: Not on file  Food Insecurity: Not on file  Transportation Needs: Not on file  Physical Activity: Not on file  Stress: No Stress Concern Present (07/04/2021)   [place]    Feeling of Stress : Only a little  Recent Concern: Stress - Stress Concern Present (06/20/2021)   [place]    Feeling of Stress : To some extent  Social Connections: Not on file  Intimate Partner Violence: Not on file    Family History  Problem Relation Age of Onset   Irritable bowel syndrome Mother    Asthma Father    Diabetes Maternal Grandmother    Diabetes Paternal Grandmother    Kidney disease Paternal Alanda Slim' disease Paternal Aunt    Ovarian cancer Maternal Aunt    Stomach cancer Neg Hx    Esophageal cancer Neg Hx    Colon cancer Neg Hx     Current Facility-Administered Medications  Medication Dose Route Frequency Provider Last Rate Last Admin   acetaminophen (TYLENOL) tablet 1,000 mg  1,000 mg Oral On Call to OR Michael Boston, MD       acetaminophen (TYLENOL) tablet 650 mg  650 mg Oral Q6H PRN Shela Leff, MD   [phone]  mg at 03/13/22 0033   bupivacaine liposome (EXPAREL) 1.3 % injection 266 mg  20 mL Infiltration Once Michael Boston, MD       celecoxib (CELEBREX) capsule 200 mg  200 mg Oral On Call to OR Michael Boston, MD       Chlorhexidine Gluconate Cloth 2 % PADS 6 each  6 each Topical Once Michael Boston, MD       And   Chlorhexidine Gluconate Cloth 2 % PADS 6 each  6 each Topical Once Michael Boston, MD       dicyclomine (BENTYL) capsule 10 mg  10 mg Oral TID AC Willia Craze, NP   10 mg at 03/14/22 0845   diltiazem 2 % gel   Topical TID Willia Craze, NP   Given at 03/14/22 0847   doxycycline (VIBRA-TABS) tablet 100 mg  100 mg Oral [address] [place], DO   100 mg at 03/14/22 0846   FLUoxetine (PROZAC) capsule 40 mg  40 mg Oral Daily Raiford Noble Elverta, DO   40 mg at 03/14/22 0846   gabapentin (NEURONTIN) capsule 300 mg  300 mg Oral BID Raiford Noble Latif, DO   300 mg at 03/14/22 0845   gabapentin  (NEURONTIN) capsule 300 mg  300 mg Oral On Call to OR Michael Boston, MD       methocarbamol (ROBAXIN) tablet 500 mg  500 mg Oral Q6H PRN Raiford Noble Latif, DO       morphine (PF) 2 MG/ML injection 1 mg  1 mg Intravenous Q4H PRN Shela Leff, MD   1 mg at 03/13/22 2131   ondansetron (ZOFRAN) injection 4 mg  4 mg Intravenous Q6H PRN Kathie Dike, MD   4 mg at 03/13/22 0955   piperacillin-tazobactam (ZOSYN) IVPB 3.375 g  3.375 g Intravenous Tor Netters, MD       zonisamide (ZONEGRAN) capsule 100 mg  100 mg Oral QHS Sheikh, [place], DO   100 mg at 03/13/22 2057     Allergies  Allergen Reactions   Blue Dyes (Parenteral) Anaphylaxis   Blueberry [Vaccinium Angustifolium] Anaphylaxis   Raspberry Anaphylaxis   Shellfish Allergy Anaphylaxis    ALL SHELLFISH   Amitriptyline Other (See Comments)    Fast heart rate and throat closing    ROS:   All other systems reviewed & are negative except per HPI or as noted below: Constitutional:  No fevers, chills, sweats.  Weight stable Eyes:  No vision changes, No discharge HENT:  No sore throats, nasal drainage Lymph: No neck swelling, No bruising easily Pulmonary:  No cough, productive sputum CV: No orthopnea, PND  Patient walks 30 minutes without difficulty.  No exertional chest/neck/shoulder/arm pain.  GI: No personal nor family history of GI/colon cancer, inflammatory bowel disease, allergy such as Celiac Sprue, dietary/dairy problems, colitis, ulcers nor gastritis.  No recent sick contacts/gastroenteritis.  No travel outside the country.  No changes in diet.  Renal: No UTIs, No hematuria Genital:  No drainage, bleeding, masses Musculoskeletal: No severe joint pain.  Good ROM major joints Skin:  No sores or lesions Heme/Lymph:  No easy bleeding.  No swollen lymph nodes   BP (!) 105/54 (BP Location: Right Arm)   Pulse 77   Temp 98.1 F (36.7 C) (Oral)   Resp 18   Ht 5' 8"  (1.727 m)   Wt 107 kg   SpO2 97%   BMI 35.88  kg/m   Physical Exam:  Constitutional: Not cachectic.  Hygeine adequate.  Vitals signs as above.   Eyes: Pupils reactive, normal extraocular movements. Sclera nonicteric Neuro: CN II-XII intact.  No major focal sensory defects.  No major motor deficits. Lymph: No head/neck/groin lymphadenopathy Psych:  No severe agitation.  No severe anxiety.  Judgment & insight Adequate, Oriented x4, HENT: Normocephalic, Mucus membranes moist.  No thrush.   Neck: Supple, No tracheal deviation.  No obvious thyromegaly Chest: No pain to chest wall compression.  Good respiratory excursion.  No audible wheezing CV:  Pulses intact.  regular rhythm.  No major extremity edema  Abdomen:  Obese Hernia: Not present. Diastasis recti: Not present. Soft.   Mildly distended.  Tenderness at left lower quadrant suprapubic region mild. .  No hepatomegaly.  No splenomegaly  Gen:  Inguinal hernia: Not present.  Inguinal lymph nodes: without lymphadenopathy.    Rectal: Perianal skin clear with not obvious fistula seen.  No external thrombosed hemorrhoid.  Pain and discomfort along left posterior anal region higher up.  Correlates with abscess seen on MRI.  Ext: No obvious deformity or contracture.  Edema: Not present.  No cyanosis Skin: No major subcutaneous nodules.  Warm and dry Musculoskeletal: Severe joint rigidity not present.  No obvious clubbing.  No digital petechiae.     Results:   Labs: Results for orders placed or performed during the hospital encounter of 03/09/22 (from the past 48 hour(s))  Comprehensive metabolic panel     Status: Abnormal   Collection Time: 03/13/22  4:41 AM  Result Value Ref Range   Sodium 141 135 - 145 mmol/L   Potassium 3.8 3.5 - 5.1 mmol/L   Chloride 113 (H) 98 - 111 mmol/L   CO2 24 22 - 32 mmol/L   Glucose, Bld 93 70 - 99 mg/dL    Comment: Glucose reference range applies only to samples taken after fasting for at least 8 hours.   BUN <5 (L) 6 - 20 mg/dL   Creatinine, Ser 0.80  0.44 - 1.00 mg/dL   Calcium 8.5 (L) 8.9 - 10.3 mg/dL   Total Protein 6.9 6.5 - 8.1 g/dL   Albumin 2.8 (L) 3.5 - 5.0 g/dL   AST 20 15 - 41 U/L   ALT 19 0 - 44 U/L   Alkaline Phosphatase 62 38 - 126 U/L   Total Bilirubin 0.5 0.3 - 1.2 mg/dL   GFR, Estimated >60 >60 mL/min    Comment: (NOTE) Calculated using the CKD-EPI Creatinine Equation (2021)    Anion gap 4 (L) 5 - 15    Comment: Performed at [facility], [place] [address], [place], [place] [address]  CBC with Differential/Platelet     Status: Abnormal   Collection Time: 03/13/22  4:41 AM  Result Value Ref Range   WBC 7.8 4.0 - 10.5 K/uL   RBC 3.69 (L) 3.87 - 5.11 MIL/uL   Hemoglobin 9.1 (L) 12.0 - 15.0 g/dL   HCT 29.9 (L) 36.0 - 46.0 %   MCV 81.0 80.0 - 100.0 fL   MCH 24.7 (L) 26.0 - 34.0 pg   MCHC 30.4 30.0 - 36.0 g/dL   RDW 15.7 (H) 11.5 - 15.5 %   Platelets 294 150 - 400 K/uL   nRBC 0.3 (H) 0.0 - 0.2 %   Neutrophils Relative % 59 %   Neutro Abs 4.6 1.7 - 7.7 K/uL   Lymphocytes Relative 24 %   Lymphs Abs 1.9 0.7 - 4.0 K/uL   Monocytes Relative 12 %   Monocytes Absolute 0.9 0.1 -  1.0 K/uL   Eosinophils Relative 2 %   Eosinophils Absolute 0.1 0.0 - 0.5 K/uL   Basophils Relative 0 %   Basophils Absolute 0.0 0.0 - 0.1 K/uL   Immature Granulocytes 3 %   Abs Immature Granulocytes 0.24 (H) 0.00 - 0.07 K/uL    Comment: Performed at [facility], [place] [address] [place], [place] [address]  Magnesium     Status: None   Collection Time: 03/13/22  4:41 AM  Result Value Ref Range   Magnesium 2.0 1.7 - 2.4 mg/dL    Comment: Performed at [facility], [place] [address] [place], [place] [address]  Phosphorus     Status: None   Collection Time: 03/13/22  4:41 AM  Result Value Ref Range   Phosphorus 4.2 2.5 - 4.6 mg/dL    Comment: Performed at [facility], [place] [address], [place], [place] [address]  CBC with Differential/Platelet     Status: Abnormal    Collection Time: 03/14/22  5:32 AM  Result Value Ref Range   WBC 8.9 4.0 - 10.5 K/uL   RBC 3.61 (L) 3.87 - 5.11 MIL/uL   Hemoglobin 9.0 (L) 12.0 - 15.0 g/dL   HCT 29.4 (L) 36.0 - 46.0 %   MCV 81.4 80.0 - 100.0 fL   MCH 24.9 (L) 26.0 - 34.0 pg   MCHC 30.6 30.0 - 36.0 g/dL   RDW 16.0 (H) 11.5 - 15.5 %   Platelets 324 150 - 400 K/uL   nRBC 0.3 (H) 0.0 - 0.2 %   Neutrophils Relative % 58 %   Neutro Abs 5.1 1.7 - 7.7 K/uL   Lymphocytes Relative 28 %   Lymphs Abs 2.5 0.7 - 4.0 K/uL   Monocytes Relative 9 %   Monocytes Absolute 0.8 0.1 - 1.0 K/uL   Eosinophils Relative 2 %   Eosinophils Absolute 0.2 0.0 - 0.5 K/uL   Basophils Relative 0 %   Basophils Absolute 0.0 0.0 - 0.1 K/uL   WBC Morphology MILD LEFT SHIFT (1-5% METAS, OCC MYELO, OCC BANDS)    Immature Granulocytes 3 %   Abs Immature Granulocytes 0.26 (H) 0.00 - 0.07 K/uL    Comment: Performed at [facility], [place] [address], [place], [place] [address]  Vitamin B12     Status: None   Collection Time: 03/14/22  5:32 AM  Result Value Ref Range   Vitamin B-12 277 180 - 914 pg/mL    Comment: (NOTE) This assay is not validated for testing neonatal or myeloproliferative syndrome specimens for Vitamin B12 levels. Performed at [facility], [place] [address], [place], [place] [address]   Folate     Status: None   Collection Time: 03/14/22  5:32 AM  Result Value Ref Range   Folate 10.5 >5.9 ng/mL    Comment: Performed at [facility], [place] [address] [place], [place] [address]  Iron and TIBC     Status: None   Collection Time: 03/14/22  5:32 AM  Result Value Ref Range   Iron 29 28 - 170 ug/dL   TIBC 260 250 - 450 ug/dL   Saturation Ratios 11 10.4 - 31.8 %   UIBC 231 ug/dL    Comment: Performed at [facility], [place] [address], [place], [place] [address]  Ferritin     Status: None   Collection Time: 03/14/22  5:32 AM  Result Value Ref Range   Ferritin 91 11  -  307 ng/mL    Comment: Performed at [facility], [place] [address] [place], [place] [address]  Reticulocytes     Status: Abnormal   Collection Time: 03/14/22  5:32 AM  Result Value Ref Range   Retic Ct Pct 1.0 0.4 - 3.1 %   RBC. 3.61 (L) 3.87 - 5.11 MIL/uL   Retic Count, Absolute 35.7 19.0 - 186.0 K/uL   Immature Retic Fract 44.2 (H) 2.3 - 15.9 %    Comment: Performed at [facility], [place] [address] [place], [place] [address]    Imaging / Studies: MR PELVIS W WO CONTRAST  Result Date: 03/13/2022 CLINICAL DATA:  Anorectal abscess EXAM: MRI PELVIS WITHOUT AND WITH CONTRAST TECHNIQUE: Multiplanar multisequence MR imaging of the pelvis was performed both before and after administration of intravenous contrast. CONTRAST:  69m GADAVIST GADOBUTROL 1 MMOL/ML IV SOLN COMPARISON:  CT abdomen/pelvis dated 03/09/2022 FINDINGS: Urinary Tract:  Bladder is within normal limits. Bowel: Sigmoid colon is decompressed. Rectal wall thickening with pericolonic inflammatory changes. This appearance is suspicious for infectious/inflammatory proctocolitis. Vascular/Lymphatic: No evidence of aneurysm. Progressive presacral and perirectal lymphadenopathy measuring up to 10 mm short axis (series 7/image 10), likely inflammatory. Reproductive: Uterus and bilateral ovaries are within normal limits. Other: Small volume pelvic ascites, progressive. Presacral fluid with inflammatory change. Interval development of a 2.8 x 3.7 x 4.4 cm complex fluid collection in the left perirectal space (series 7/image 20), progressive and more organized when compared to recent CT, compatible with developing abscess. Musculoskeletal: No focal osseous lesions. IMPRESSION: Progressive rectal wall thickening, suspicious for infectious/inflammatory proctocolitis. Developing 4.4 cm left perirectal abscess, new/progressive. Associated progressive lymphadenopathy and small volume pelvic ascites. Electronically  Signed   By: SJulian HyM.D.   On: 03/13/2022 18:24   UKoreaPELVIC COMPLETE W TRANSVAGINAL AND TORSION R/O  Result Date: 03/11/2022 CLINICAL DATA:  Lower abdominal pain. EXAM: TRANSABDOMINAL AND TRANSVAGINAL ULTRASOUND OF PELVIS DOPPLER ULTRASOUND OF OVARIES TECHNIQUE: Both transabdominal and transvaginal ultrasound examinations of the pelvis were performed. Transabdominal technique was performed for global imaging of the pelvis including uterus, ovaries, adnexal regions, and pelvic cul-de-sac. It was necessary to proceed with endovaginal exam following the transabdominal exam to visualize the uterus, endometrium and ovaries. Color and duplex Doppler ultrasound was utilized to evaluate blood flow to the ovaries. COMPARISON:  CT 03/09/2022, pelvic ultrasound 11/20/2021 FINDINGS: Uterus Measurements: 5.5 x 3.1 x 4.1 cm = volume: 36 mL. The uterus is retroverted. No fibroids or other mass visualized. Endometrium Thickness: 10 mm. The previous possible endometrial polyp is not seen on the current exam. Right ovary Measurements: 3.7 x 2.4 x 2.3 cm = volume: 10 mL. Increased number of relative uniformly sized follicles. There may be slight echogenic ovarian stroma. No dominant cyst or solid lesion. Normal ovarian blood flow. Left ovary Measurements: 3.2 x 2.9 x 2.4 cm = volume: 11.6 mL. Increased number of relative uniformly sized follicles. There may be slight echogenic ovarian stroma. No dominant cyst or solid lesion. Normal blood flow. Hypoechoic structure in the left adnexa measuring 8 mm short axis is the appearance of a pelvic lymph node. Pulsed Doppler evaluation of both ovaries demonstrates normal low-resistance arterial and venous waveforms. Other findings Moderate amount of free fluid in the pelvis, appearing simple. IMPRESSION: 1. Sonographic appearance of the ovaries can be seen in the setting of polycystic ovarian syndrome. 2. Normal ovarian blood flow. 3. The previous questioned endometrial polyp is  not seen on the current exam. 4. Moderate  amount of free fluid in the pelvis, appearing simple. Electronically Signed   By: Keith Rake M.D.   On: 03/11/2022 15:14   CT Angio Chest Pulmonary Embolism (PE) W or WO Contrast  Result Date: 03/11/2022 CLINICAL DATA:  Pulmonary embolism (PE) suspected, positive D-dimer. Tachycardia, fever, dyspnea EXAM: CT ANGIOGRAPHY CHEST WITH CONTRAST TECHNIQUE: Multidetector CT imaging of the chest was performed using the standard protocol during bolus administration of intravenous contrast. Multiplanar CT image reconstructions and MIPs were obtained to evaluate the vascular anatomy. RADIATION DOSE REDUCTION: This exam was performed according to the departmental dose-optimization program which includes automated exposure control, adjustment of the mA and/or kV according to patient size and/or use of iterative reconstruction technique. CONTRAST:  151m OMNIPAQUE IOHEXOL 350 MG/ML SOLN COMPARISON:  None Available. FINDINGS: Cardiovascular: Adequate opacification of the pulmonary arterial tree. No intraluminal filling defect identified to suggest acute pulmonary embolism. Central pulmonary arteries are of normal caliber. No significant coronary artery calcification. Cardiac size within normal limits. No pericardial effusion. No significant atherosclerotic calcification within the thoracic aorta. No aortic aneurysm. Mediastinum/Nodes: No enlarged mediastinal, hilar, or axillary lymph nodes. Thyroid gland, trachea, and esophagus demonstrate no significant findings. Lungs/Pleura: Lungs are clear. No pleural effusion or pneumothorax. Upper Abdomen: No acute abnormality. Musculoskeletal: No chest wall abnormality. No acute or significant osseous findings. Review of the MIP images confirms the above findings. IMPRESSION: No pulmonary embolism. Normal examination. Electronically Signed   By: AFidela SalisburyM.D.   On: 03/11/2022 04:00   CT Abdomen Pelvis W Contrast  Result Date:  03/09/2022 CLINICAL DATA:  RLQ abdominal pain (Age >= 14y) Abdominal pain, fever and back pain for 3 days. Nausea and diarrhea. EXAM: CT ABDOMEN AND PELVIS WITH CONTRAST TECHNIQUE: Multidetector CT imaging of the abdomen and pelvis was performed using the standard protocol following bolus administration of intravenous contrast. RADIATION DOSE REDUCTION: This exam was performed according to the departmental dose-optimization program which includes automated exposure control, adjustment of the mA and/or kV according to patient size and/or use of iterative reconstruction technique. CONTRAST:  863mOMNIPAQUE IOHEXOL 300 MG/ML  SOLN COMPARISON:  No prior CT.  Pelvic ultrasound 11/20/2021 FINDINGS: Lower chest: Clear lung bases. Hepatobiliary: Mild decreased hepatic density typical of steatosis. No focal hepatic abnormality. Partially distended gallbladder. No calcified gallstone. No biliary dilatation. Pancreas: No ductal dilatation or inflammation. Spleen: Normal in size without focal abnormality. Adrenals/Urinary Tract: Normal adrenal glands. No hydronephrosis or perinephric edema. Homogeneous renal enhancement. No renal calculi or focal lesion. Urinary bladder is physiologically distended without wall thickening. Stomach/Bowel: Normal appendix, appreciated on coronal series 5, images 57 and 58. Stomach is partially distended. Minimal fecalization of small bowel contents in the pelvis. No small bowel inflammation or obstruction. There is mild wall thickening involving the distal sigmoid colon and rectum. Vascular/Lymphatic: Normal caliber abdominal aorta. Patent portal and splenic veins. There are multiple prominent retroperitoneal and external iliac lymph nodes, not enlarged by size criteria Reproductive: There is generalized stranding of the right and left pelvic fat. Small amount of presacral non organized fluid. The uterus is anteverted. There is a small amount of fluid in the lower endometrial canal. The ovaries  are symmetric in size. No adnexal mass. No evidence of pyo- or hydrosalpinx. Other: Generalized edema of the intrapelvic fat. Small amount of non organized presacral fluid. No drainable or focal fluid collection. No free air. Musculoskeletal: There are no acute or suspicious osseous abnormalities. IMPRESSION: 1. Generalized edema of the intrapelvic fat with small amount of  non organized presacral fluid. There is wall thickening of the distal sigmoid colon and rectum. Inflammatory change may be secondary to proctocolitis or pelvic inflammatory disease with reactive wall thickening. 2. Normal appendix. 3. Mild hepatic steatosis. 4. Multiple prominent retroperitoneal and external iliac lymph nodes, not enlarged by size criteria, likely reactive. Electronically Signed   By: Keith Rake M.D.   On: 03/09/2022 22:01    Medications / Allergies: per chart  Antibiotics: Anti-infectives (From admission, onward)    Start     Dose/Rate Route Frequency Ordered Stop   03/14/22 1015  cefoTEtan (CEFOTAN) 2 g in sodium chloride 0.9 % 100 mL IVPB  Status:  Discontinued        2 g 200 mL/hr over 30 Minutes Intravenous On call to O.R. 03/14/22 2229 03/14/22 0927   03/14/22 1000  piperacillin-tazobactam (ZOSYN) IVPB 3.375 g        3.375 g 12.5 mL/hr over 240 Minutes Intravenous Every 8 hours 03/14/22 0836 03/19/22 0959   03/12/22 2200  doxycycline (VIBRA-TABS) tablet 100 mg        100 mg Oral Every 12 hours 03/12/22 1420 03/24/22 0959   03/10/22 2300  cefTRIAXone (ROCEPHIN) 1 g in sodium chloride 0.9 % 100 mL IVPB  Status:  Discontinued        1 g 200 mL/hr over 30 Minutes Intravenous Every 24 hours 03/10/22 0409 03/10/22 1159   03/10/22 2300  cefTRIAXone (ROCEPHIN) 2 g in sodium chloride 0.9 % 100 mL IVPB  Status:  Discontinued        2 g 200 mL/hr over 30 Minutes Intravenous Every 24 hours 03/10/22 1159 03/12/22 1420   03/10/22 1100  metroNIDAZOLE (FLAGYL) IVPB 500 mg  Status:  Discontinued        500  mg 100 mL/hr over 60 Minutes Intravenous Every 12 hours 03/10/22 0409 03/12/22 1420   03/10/22 0500  doxycycline (VIBRAMYCIN) 100 mg in sodium chloride 0.9 % 250 mL IVPB  Status:  Discontinued        100 mg 125 mL/hr over 120 Minutes Intravenous Every 12 hours 03/10/22 0409 03/12/22 1420   03/09/22 2315  metroNIDAZOLE (FLAGYL) IVPB 500 mg        500 mg 100 mL/hr over 60 Minutes Intravenous  Once 03/09/22 2308 03/10/22 0720   03/09/22 2300  cefTRIAXone (ROCEPHIN) 1 g in sodium chloride 0.9 % 100 mL IVPB        1 g 200 mL/hr over 30 Minutes Intravenous  Once 03/09/22 2253 03/10/22 0029         Note: Portions of this report may have been transcribed using voice recognition software. Every effort was made to ensure accuracy; however, inadvertent computerized transcription errors may be present.   Any transcriptional errors that result from this process are unintentional.    Adin Hector, MD, FACS, MASCRS Esophageal, Gastrointestinal & Colorectal Surgery Robotic and Minimally Invasive Surgery  [facility] [address], [place], [place] [address] [phone] Fax [phone] Main  CONTACT INFORMATION:  Weekday (9AM-5PM): Call [facility] main office at [phone]  Weeknight (5PM-9AM) or Weekend/Holiday: Check [email or web address] (password " [email or web address]") for General Surgery [facility] coverage  (Please, do not use SecureChat as it is not reliable communication to reach operating surgeons for immediate patient care)       03/14/2022  9:28 AM

## 2022-03-14 NOTE — Discharge Instructions (Signed)
##############################################  ANORECTAL SURGERY:  POST OPERATIVE INSTRUCTIONS  ######################################################################  EAT Start with a pureed / full liquid diet After 24 hours, gradually transition to a high fiber diet.    CONTROL PAIN Control pain so you can tolerate bowel movements,  walk, sleep, tolerate sneezing/coughing, and go up/down stairs.   HAVE A BOWEL MOVEMENT DAILY Keep your bowels regular to avoid problems.   Taking a fiber supplement every day to keep bowels soft.   Try a laxative to override constipation. Use an antidairrheal to slow down diarrhea.   Call if not better after 2 tries  WALK Walk an hour a day.  Control your pain to do that.   CALL IF YOU HAVE PROBLEMS/CONCERNS Call if you are still struggling despite following these instructions. Call if you have concerns not answered by these instructions  ######################################################################    Take your usually prescribed home medications unless otherwise directed.  DIET: Follow a light bland diet & liquids the first 24 hours after arrival home, such as soup, liquids, starches, etc.  Be sure to drink plenty of fluids.  Quickly advance to a usual solid diet within a few days.  Avoid fast food or heavy meals as your are more likely to get nauseated or have irregular bowels.  A low-fat, high-fiber diet for the rest of your life is ideal.  PAIN CONTROL: Expect swelling and discomfort in the anus/rectal area. Pain is best controlled by a usual combination of many methods TOGETHER: Warm baths/soaks or Ice packs Over the counter pain medication Prescription pain medications Topical creams    Warm water baths or ice packs (30-60 minutes up to 8 times a day, especially after bowel meovements) will help. Use ice for the first few days to help decrease swelling and bruising, then switch to heat such as warm towels, sitz baths, warm  baths, warm showers, etc to help relax tight/sore spots and speed recovery.  Some people prefer to use ice alone, heat alone, alternating between ice & heat.  Experiment to what works for you.    It is helpful to take an over-the-counter pain medication continuously for the first few weeks.  Choose one of the following that works best for you: Naproxen (Aleve, etc)  Two 252m tabs twice a day Ibuprofen (Advil, etc) Three 2070mtabs four times a day (every meal & bedtime) Acetaminophen (Tylenol, etc) 500-65044mour times a day (every meal & bedtime)  A  prescription for pain medication (such as oxycodone, hydrocodone, etc) should be given to you upon discharge.  Take your pain medication as prescribed.  If you are having problems/concerns with the prescription medicine (does not control pain, nausea, vomiting, rash, itching, etc), please call us [phone] see if we need to switch you to a different pain medicine that will work better for you and/or control your side effect better. If you need a refill on your pain medication, please contact your pharmacy.  They will contact our office to request authorization. Prescriptions will not be filled after 5 pm or on week-ends.  If can take up to 48 hours for it to be filled & ready so avoid waiting until you are down to thel ast pill.  A topical cream (Dibucaine) or a prescription for a cream (such as diltiazem 2% gel) may be given to you.  Many people find relief with topical creams.  Some people find it burns too much.  Experiment.  If it helps, use it.  If it burns, don't  using it.  You also may receive a prescription for diazepam, a muscle relaxant to help you to be able to urinate and defecate more easily.  It is safe to take a few doses with the other medications as long as you are not planning to drive or do anything intense.  Hopefully this can minimize the chance of needing a Foley catheter into your bladder     KEEP YOUR BOWELS  REGULAR The goal is one soft bowel movement a day Avoid getting constipated.  Between the surgery and the pain medications, it is common to experience some constipation.  Increasing fluid intake and taking a fiber supplement (such as Metamucil, Citrucel, FiberCon, MiraLax, etc) 2-4 times a day regularly will usually help prevent this problem from occurring.  A mild laxative (prune juice, Milk of Magnesia, MiraLax, etc) should be taken according to package directions if there are no bowel movements after 48 hours. Watch out for diarrhea.  If you have many loose bowel movements, simplify your diet to bland foods & liquids for a few days.  Stop any stool softeners and decrease your fiber supplement.  Switching to mild anti-diarrheal medications (Kayopectate, Pepto Bismol) can help.  Can try an imodium/loperamide dose.  If this worsens or does not improve, please call us.  Wound Care   a. You have some fluffed gauze on top of the anus to help catch drainage and bleeding.  Let the gauze fall off with the first bowel movement or shower.  It is okay to reinforce or replace as needed.  Bleeding is common at first and occasionally tapers off   b. Place soft cotton balls on the anus/wounds and use an absorbent pad in your underwear as needed to catch any drainage and help keep the area.  Try to use cotton balls or pads over regular gauze or toilet paper as gauze will stick and pull, causing pain.  Cotton will come off more easily.   c. Keep the area clean and dry.  Bathe / shower every day.  Keep the area clean by showering / bathing over the incision / wound.   It is okay to soak an open wound to help wash it.  Consider using a squeeze bottle filled with warm water to gently wash the anal area.  Wet wipes or showers / gentle washing after bowel movements is often less traumatic than regular toilet paper.  Use a Sitz Bath 4-8 times a day for relief  A sitz bath is a warm water bath taken in the sitting position  that covers only the hips and buttocks. It may be used for either healing or hygiene purposes. Sitz baths are also used to relieve pain, itching, or muscle spasms.  Gently cleaned the area and the heat will help lower spasm and offer better pain control.    Fill the bathtub half full with warm water. Sit in the water and open the drain a little. Turn on the warm water to keep the tub half full. Keep the water running constantly. Soak in the water for 15 to 20 minutes. After the sitz bath, pat the affected area dry first.   d. You will often notice bleeding, especially with bowel movements.  This should slow down by the end of the first week of surgery.  Sitting on an ice pack can help.   e. Expect some drainage.  You often will have some blood or yellow drainage with open wounds.  Sometimes she will get a little  leaking of liquid stool until the incision/wounds have fully close down.  This should slow down by the end of the first week of surgery, but you will have occasional bleeding or drainage up to a few months after surgery.  Wear an absorbent pad or soft cotton gauze in your underwear until the drainage stops.  ACTIVITIES as tolerated:    You may resume regular (light) daily activities beginning the next day--such as daily self-care, walking, climbing stairs--gradually increasing activities as tolerated.  If you can walk 30 minutes without difficulty, it is safe to try more intense activity such as jogging, treadmill, bicycling, low-impact aerobics, swimming, etc. Save the most intensive and strenuous activity for last such as sit-ups, heavy lifting, contact sports, etc  Refrain from any heavy lifting or straining until you are off narcotics for pain control.   DO NOT PUSH THROUGH PAIN.  Let pain be your guide: If it hurts to do something, don't do it.  Pain is your body warning you to avoid that activity for another week until the pain goes down. You may drive when you are no longer taking  prescription pain medication, you can comfortably sit for long periods of time, and you can safely maneuver your car and apply brakes. You may have sexual intercourse when it is comfortable.   FOLLOW UP in our office Please call [facility] at [phone] to set up an appointment to see your surgeon in the office for a follow-up appointment approximately 3 weeks after your surgery. Make sure that you call for this appointment the day you arrive home to ensure a convenient appointment time.  8. IF YOU HAVE DISABILITY OR FAMILY LEAVE FORMS, BRING THEM TO THE OFFICE FOR PROCESSING.  DO NOT GIVE THEM TO YOUR DOCTOR.        WHEN TO CALL us [phone]: Poor pain control Reactions / problems with new medications (rash/itching, nausea, etc)  Fever over 101.5 F (38.5 C) Inability to urinate Nausea and/or vomiting Worsening swelling or bruising Continued bleeding from incision. Increased pain, redness, or drainage from the incision  The clinic staff is available to answer your questions during regular business hours (8:30am-5pm).  Please don't hesitate to call and ask to speak to one of our nurses for clinical concerns.   A surgeon from [facility] is always on call at the hospitals   If you have a medical emergency, go to the nearest emergency room or call 911.    [facility], [place], [place], [place], [place]  [address] ? MAIN: [phone] ? TOLL FREE: [phone] ? FAX [phone] [email or web address]  #####################################################

## 2022-03-14 NOTE — Transfer of Care (Signed)
Immediate Anesthesia Transfer of Care Note  Patient: Jacqueline Gonzalez  Procedure(s) Performed: [place]  Patient Location: PACU  Anesthesia Type:General  Level of Consciousness: awake, drowsy and patient cooperative  Airway & Oxygen Therapy: Patient Spontanous Breathing and Patient connected to face mask oxygen  Post-op Assessment: Report given to RN and Post -op Vital signs reviewed and stable  Post vital signs: Reviewed and stable  Last Vitals:  Vitals Value Taken Time  BP 141/85 03/14/22 1328  Temp 35.9 C 03/14/22 1328  Pulse 86 03/14/22 1330  Resp 21 03/14/22 1330  SpO2 100 % 03/14/22 1330  Vitals shown include unvalidated device data.  Last Pain:  Vitals:   03/14/22 1000  TempSrc:   PainSc: 3       Patients Stated Pain Goal: 2 (33/74/45 1460)  Complications: No notable events documented.

## 2022-03-14 NOTE — Progress Notes (Signed)
[facility] Subjective: Jacqueline Gonzalez is a 21 year old black female with abdominal pain fevers and abnormal CT scan.  Admitted for possible sepsis related to UTI versus PID versus proctocolitis noted on CT. She had an MRI done yesterday that shows an enlarging 4.4 cm left perirectal abscess with progressive rectal wall thickening suspicious for infectious versus inflammatory proctocolitis along with associated progressive lymphadenopathy and small volume pelvic ascites. Patient complains of worsening rectal pain. Surgical consultation is pending.  Objective: Vital signs in last 24 hours: Temp:  [98.1 F (36.7 C)-98.6 F (37 C)] 98.1 F (36.7 C) (08/26 0451) Pulse Rate:  [77-94] 77 (08/26 0451) Resp:  [18] 18 (08/26 0451) BP: (105-124)/(54-82) 105/54 (08/26 0451) SpO2:  [97 %-100 %] 97 % (08/26 0451) Last BM Date : 03/13/22 (small per pt)  Intake/Output from previous day: 08/25 0701 - 08/26 0700 In: 954 [P.O.:954] Out: 1500 [Urine:1500] Intake/Output this shift: No intake/output data recorded.  General appearance: alert, cooperative, no distress, and morbidly obese Resp: clear to auscultation bilaterally Cardio: regular rate and rhythm, S1, S2 normal, no murmur, click, rub or gallop GI: soft, non-tender; bowel sounds normal; no masses,  no organomegaly Perirectal examination reveals fluctuance with extreme tenderness on palpation of the left buttock  Lab Results: Recent Labs    03/12/22 0807 03/13/22 0441 03/14/22 0532  WBC 8.7 7.8 8.9  HGB 8.5* 9.1* 9.0*  HCT 28.1* 29.9* 29.4*  PLT 253 294 324   BMET Recent Labs    03/12/22 0521 03/13/22 0441  NA 139 141  K 3.8 3.8  CL 111 113*  CO2 20* 24  GLUCOSE 100* 93  BUN 5* <5*  CREATININE 0.77 0.80  CALCIUM 8.2* 8.5*   LFT Recent Labs    03/13/22 0441  PROT 6.9  ALBUMIN 2.8*  AST 20  ALT 19  ALKPHOS 62  BILITOT 0.5   Studies/Results: MR PELVIS W WO CONTRAST  Result Date: 03/13/2022 CLINICAL DATA:   Anorectal abscess EXAM: MRI PELVIS WITHOUT AND WITH CONTRAST TECHNIQUE: Multiplanar multisequence MR imaging of the pelvis was performed both before and after administration of intravenous contrast. CONTRAST:  35m GADAVIST GADOBUTROL 1 MMOL/ML IV SOLN COMPARISON:  CT abdomen/pelvis dated 03/09/2022 FINDINGS: Urinary Tract:  Bladder is within normal limits. Bowel: Sigmoid colon is decompressed. Rectal wall thickening with pericolonic inflammatory changes. This appearance is suspicious for infectious/inflammatory proctocolitis. Vascular/Lymphatic: No evidence of aneurysm. Progressive presacral and perirectal lymphadenopathy measuring up to 10 mm short axis (series 7/image 10), likely inflammatory. Reproductive: Uterus and bilateral ovaries are within normal limits. Other: Small volume pelvic ascites, progressive. Presacral fluid with inflammatory change. Interval development of a 2.8 x 3.7 x 4.4 cm complex fluid collection in the left perirectal space (series 7/image 20), progressive and more organized when compared to recent CT, compatible with developing abscess. Musculoskeletal: No focal osseous lesions. IMPRESSION: Progressive rectal wall thickening, suspicious for infectious/inflammatory proctocolitis. Developing 4.4 cm left perirectal abscess, new/progressive. Associated progressive lymphadenopathy and small volume pelvic ascites. Electronically Signed   By: SJulian HyM.D.   On: 03/13/2022 18:24    Medications: I have reviewed the patient's current medications. Prior to Admission:  Medications Prior to Admission  Medication Sig Dispense Refill Last Dose   acetaminophen (TYLENOL) 500 MG tablet Take 500 mg by mouth every 6 (six) hours as needed for headache (pain).   03/09/2022   Acetaminophen-Caff-Pyrilamine (MIDOL COMPLETE) 500-60-15 MG TABS Take 2 tablets by mouth daily as needed (cramps/pain).   2-3 mlonths ago   aspirin-acetaminophen-caffeine (  EXCEDRIN MIGRAINE) 250-250-65 MG tablet Take 2  tablets by mouth every 6 (six) hours as needed for headache or migraine.   03/05/2022   cetirizine (ZYRTEC) 10 MG tablet Take 10 mg by mouth daily as needed for allergies.   1-2 months ago   FLUoxetine (PROZAC) 40 MG capsule Take 1 capsule (40 mg total) by mouth daily. (Patient taking differently: Take 40 mg by mouth every morning.) 90 capsule 3 8/17 or 8/18   fluticasone (FLONASE) 50 MCG/ACT nasal spray Place 2 sprays into both nostrils daily. (Patient taking differently: Place 2 sprays into both nostrils daily as needed for allergies.) 16 g 6 1-2 months ago   Psyllium (METAMUCIL PO) Take 1 packet by mouth daily as needed (constipation).   1-2 months ago   triamcinolone cream (KENALOG) 0.1 % Apply 1 Application topically 2 (two) times daily as needed (eczema).   few weeks ago   [place] Medication Name: Diltiazem 2% gel -Using your index finger, apply a small amount of medication inside the rectum up to your first knuckle/joint three times daily x 6-8 weeks. (Patient not taking: Reported on 03/10/2022) 30 g 1 Not Taking   baclofen (LIORESAL) 10 MG tablet Take 10 mg by mouth. (Patient not taking: Reported on 03/10/2022)   Not Taking   COVID-19 At-Home Test KIT Take as directed on package instructions 1 kit 0    cyclobenzaprine (FLEXERIL) 10 MG tablet Take 0.5-1 tablets (5-10 mg total) by mouth 3 (three) times daily as needed for muscle spasms. (Patient not taking: Reported on 12/31/2021) 30 tablet 0 Not Taking   doxycycline (VIBRAMYCIN) 100 MG capsule Take 1 capsule (100 mg total) by mouth 2 (two) times daily. (Patient not taking: Reported on 03/10/2022) 20 capsule 0 Not Taking   fluconazole (DIFLUCAN) 150 MG tablet Take 1 tablet (150 mg total) by mouth once a week. (Patient not taking: Reported on 03/03/2022) 2 tablet 0 Not Taking   hyoscyamine (LEVSIN SL) 0.125 MG SL tablet Place 1 tablet (0.125 mg total) under the tongue every 4 (four) hours as needed. (Patient not taking:  Reported on 03/10/2022) 30 tablet 2 Not Taking   ibuprofen (ADVIL) 600 MG tablet Take 1 tablet (600 mg total) by mouth every 8 (eight) hours as needed. (Patient not taking: Reported on 03/10/2022) 30 tablet 0 Not Taking   lubiprostone (AMITIZA) 8 MCG capsule Take 1 capsule (8 mcg total) by mouth 2 (two) times daily with a meal. (Patient not taking: Reported on 03/10/2022) 60 capsule 2 Not Taking   naproxen (NAPROSYN) 500 MG tablet Take 1 tablet (500 mg total) by mouth 2 (two) times daily with a meal. (Patient not taking: Reported on 03/03/2022) 30 tablet 0 Not Taking   ondansetron (ZOFRAN) 4 MG tablet Take 1 tablet (4 mg total) by mouth every 8 (eight) hours as needed for nausea or vomiting. (Patient not taking: Reported on 03/10/2022) 20 tablet 0 Not Taking   polyethylene glycol powder (GLYCOLAX/MIRALAX) 17 GM/SCOOP powder Take 17 g by mouth in the morning and at bedtime. (Patient not taking: Reported on 03/03/2022) 850 g 3 Not Taking   tranexamic acid (LYSTEDA) 650 MG TABS tablet Take 2 tablets (1,300 mg total) by mouth 3 (three) times daily. Take during menses for a maximum of five days (Patient not taking: Reported on 03/10/2022) 30 tablet 2 Not Taking   zonisamide (ZONEGRAN) 25 MG capsule Take 50 mg by mouth 2 (two) times daily. (Patient not taking: Reported on 03/10/2022)   Not  Taking   Scheduled:  Chlorhexidine Gluconate Cloth  6 each Topical Once   And   Chlorhexidine Gluconate Cloth  6 each Topical Once   [MAR Hold] dicyclomine  10 mg Oral TID AC   [MAR Hold] diltiazem   Topical TID   [MAR Hold] doxycycline  100 mg Oral Q12H   fentaNYL       [MAR Hold] FLUoxetine  40 mg Oral Daily   [MAR Hold] gabapentin  300 mg Oral BID   [MAR Hold] zonisamide  100 mg Oral QHS   Continuous:  [MAR Hold] piperacillin-tazobactam (ZOSYN)  IV 3.375 g (03/14/22 1035)   PRN:0.9 % irrigation (POUR BTL), [MAR Hold] acetaminophen, bupivacaine-epinephrine (PF), fentaNYL, fentaNYL (SUBLIMAZE) injection, [MAR Hold]  methocarbamol, [MAR Hold]  morphine injection, [MAR Hold] ondansetron (ZOFRAN) IV, ondansetron (ZOFRAN) IV, oxyCODONE **OR** oxyCODONE  Assessment/Plan: 1) Abnormal CT scan with chlamydia infection-MRI shows a perirectal abscess-we will need possible drainage and examination under anesthesia by surgery. 2) Chronic anemia.  LOS: 4 days   Juanita Craver 03/14/2022, 8:31 AM

## 2022-03-14 NOTE — Progress Notes (Addendum)
PROGRESS NOTE    Jacqueline Gonzalez  JOI:[id number] DOB: [birth year] DOA: 03/09/2022 PCP: Donney Dice, DO   Brief Narrative:  HPI per Dr. Shela Leff on 03/10/22 Jacqueline Gonzalez is a 21 y.o. female with medical history significant of anemia, anxiety, chlamydia, IBS-C, anal fissure, depression, GERD, hypertension presented to ED with complaints of fever, abdominal pain, back pain, and hematochezia.  Febrile and tachycardic on arrival to the ED.  Labs showing WBC 13.6, hemoglobin 10.0 (stable), lipase and LFTs normal, UA with signs of infection (large amount of leukocytes and greater than 50 WBCs), urine pregnancy test negative, urine culture pending, COVID and influenza PCR negative, wet prep negative.  Testing for gonorrhea, chlamydia, syphilis, and HIV pending.  Pelvic exam negative for cervical motion tenderness.  CT abdomen pelvis showing findings concerning for proctocolitis versus PID. Patient was given ceftriaxone, metronidazole, and Tylenol.   Patient reports 3-day history of abdominal pain which is generalized but worse in the lower abdomen, buttock pain/rectal pain, low back pain, and fevers.  She denies any urinary symptoms.  Denies any vaginal discharge.  Does report intermittent hematochezia for the past 3 weeks.  Unable to discuss sexual history at this time as patient's mother and friend at bedside.  She has no other complaints.   **Interim History GI was consulted and she continues to have some abdominal discomfort and pain.  Still awaiting some studies.  Pain is not really improving.  Slightly worse in the left quadrant than right.  GI considering flexible sigmoidoscopy if GYN studies are negative however she tested positive for chlamydia and is having likely PID and proctocolitis from the chlamydia.  She can planes of left buttock pain that radiates down her legs we will get an MRI of with and without contrast of the pelvis to evaluate for any abscesses related to her infection.   This could be sciatica so we have initiated gabapentin and muscle relaxants.  MRI revealed a perirectal abscess so general surgery was consulted and she is going for surgical intervention later today.   Assessment and Plan:  Sepsis secondary to chlamydia PID and chlamydia proctocolitis associated with abscess -Febrile and tachycardic on arrival to the ED.   -WBC 13.6 and trending down and is now 7.8.   -No lactate checked and no blood cultures drawn in the ED.   -Diarrhea has resolved and she has not had a bowel movement 4 days for anorexic precautions have been discontinued given that C. difficile GI pathogen panel has not been collected; infectious colitis seems unlikely at this point given her lack of diarrhea but inflammatory bowel disease could be a possibility -GI has canceled her stool studies if she starts having diarrhea again they are recommending reordering -She continues to have bilateral lower abdominal pain so we will get a complete pelvic ultrasound -Her ultrasound pelvis was done and showed "Sonographic appearance of the ovaries can be seen in the setting of polycystic ovarian syndrome.  Normal ovarian blood flow.  The previous questioned endometrial polyp is not seen on the current exam. Moderate amount of free fluid in the pelvis, appearing simple." -Per GI if GYN studies are negative and lower abdominal pain persists she may have a flex sigmoidoscopy to evaluate rectosigmoid findings on the CT scan -She is given diltiazem gel for anal fissure and likely proctocolitis -UA done and showed hazy appearance with large leukocytes, negative nitrites, few bacteria, 0-5 RBCs per high-power field, 11-20 squamous epithelial cells, greater than 50 WBCs with urine culture showing multiple  species and suggesting recollection -Blood cultures obtained on 03/10/2022 showing no growth to date at 2 days but she spiked another temperature yesterday so blood cultures were repeated and pending -Wet  prep done and negative -Continued ceftriaxone and metronidazole, add on doxycycline and because she is positive for chlamydia we will discontinue the ceftriaxone metronidazole continue on doxycycline and because of her perirectal abscess now with general surgery had started IV Zosyn -RPR was nonreactive -IV fluid hydration with normal saline now stopped -Pain management with ibuprofen 400 mg x 1, morphine IV 1 mg every 4 hours as needed for severe pain -Continuing dicyclomine 10 mg 3 times daily before meals -Continue supportive care and antiemetics with ondansetron 4 mg every 6 hours as needed nausea vomiting -Testing for gonorrhea, chlamydia, syphilis, and HIV -HIV and RPR nonreactive as above and gonorrhea and Chlamydia testing done and she was positive for chlamydia -Monitor WBC count slowly trending down and finally normalized -GI feels that she has chlamydia proctitis versus pelvic inflammatory disease with associated inflammation and remains on doxycycline and given her current infection they are not we will plan endoscopic evaluation unless patient's symptoms do not improve and their plan is for outpatient evaluation in 4 to 6 weeks after discharge however general surgery request reevaluation while she is inpatient given her new developing perirectal abscess -MRI done below and showed a 4.4 cm left perirectal abscess that was new and progressive with progressive lymphadenopathy and small volume of pelvic ascites   Lower back/left buttock pain that radiates down the leg secondary to perirectal abscess -In the setting of chlamydia proctocolitis however will need to rule out abscess or deeper infection could be also related to sciatica -Patient states it is worse with ambulation and she went to the vending machines and back and was in such excruciating pain that it brought her to tears -We will get an MRI of the pelvis with and without contrast -MRI of the pelvis done and showed "Progressive  rectal wall thickening, suspicious for infectious/inflammatory proctocolitis. Developing 4.4  cm left perirectal abscess, new/progressive.  Associated progressive lymphadenopathy and small volume pelvic ascites." -Initiate gabapentin and muscle relaxants -We will need to have close GYN follow-up   Chronic Normocytic Anemia -Hemoglobin/hematocrit went from 10.0/32.2 -> 9.3/29.5 -> 8.6/20.0 -> 8.5/28.1-> 9.1/29.9 -> 9.0/29.4 -Check anemia panel and showed an iron level of 29, U IBC of 231, TIBC 260, saturation ratios of 11%, ferritin level 91, folate level 10.5, vitamin B-12 level of 277 -Continue monitor for signs and symptoms of bleeding; no overt bleeding noted -Repeat CBC in a.m.   Anxiety and Depression -Continue home medication after pharmacy med rec and will resume her home fluoxetine   Migraines -Recently seen at the headache clinic -Initiate zonisamide 100 mg p.o. nightly dosing given to her by the headache specialist   Elevated D-dimer with chest discomfort -D-dimer was 1.93 -Given this he underwent a CTA of the chest to evaluate for PE and this was negative and also showed a normal examination   Hypokalemia -Patient's K+ went from 3.7 -> 3.1 -> 3.8 -> 3.5 -Will Replete with po KCL 40 mEQ BID x2 again  -Checked Mag Level and was 1.9 -Continue to Monitor and Replete as Necessary -Repeat CMP in the AM    Obesity -Complicates overall prognosis and care -Estimated body mass index is 35.88 kg/m as calculated from the following:   Height as of this encounter: 5' 8"  (1.727 m).   Weight as of this encounter: 107 kg.  -  Weight Loss and Dietary Counseling given  DVT prophylaxis: SCD's Start: 03/14/22 0928 SCDs Start: 03/10/22 0408    Code Status: Full Code Family Communication: Discussed with family at bedside  Disposition Plan:  Level of care: Med-Surg Status is: Inpatient Remains inpatient appropriate because: Needs further surgical intervention given that she has new  perirectal abscess   Consultants:  General Surgery Gastroenterology The case was discussed with radiologist Dr. Gus Rankin  Procedures:  See Above  Antimicrobials:  Anti-infectives (From admission, onward)    Start     Dose/Rate Route Frequency Ordered Stop   03/14/22 1235  sodium chloride 0.9 % with cefoTEtan (CEFOTAN) ADS Med       Note to Pharmacy: Hedy Camara L: cabinet override      03/14/22 1235 03/15/22 0044   03/14/22 1015  cefoTEtan (CEFOTAN) 2 g in sodium chloride 0.9 % 100 mL IVPB  Status:  Discontinued        2 g 200 mL/hr over 30 Minutes Intravenous On call to O.R. 03/14/22 0927 03/14/22 0927   03/14/22 1000  [MAR Hold]  piperacillin-tazobactam (ZOSYN) IVPB 3.375 g        (MAR Hold since Sat 03/14/2022 at 1143.Hold Reason: Transfer to a Procedural area)   3.375 g 12.5 mL/hr over 240 Minutes Intravenous Every 8 hours 03/14/22 0836 03/19/22 0959   03/12/22 2200  [MAR Hold]  doxycycline (VIBRA-TABS) tablet 100 mg        (MAR Hold since Sat 03/14/2022 at 1143.Hold Reason: Transfer to a Procedural area)   100 mg Oral Every 12 hours 03/12/22 1420 03/24/22 0959   03/10/22 2300  cefTRIAXone (ROCEPHIN) 1 g in sodium chloride 0.9 % 100 mL IVPB  Status:  Discontinued        1 g 200 mL/hr over 30 Minutes Intravenous Every 24 hours 03/10/22 0409 03/10/22 1159   03/10/22 2300  cefTRIAXone (ROCEPHIN) 2 g in sodium chloride 0.9 % 100 mL IVPB  Status:  Discontinued        2 g 200 mL/hr over 30 Minutes Intravenous Every 24 hours 03/10/22 1159 03/12/22 1420   03/10/22 1100  metroNIDAZOLE (FLAGYL) IVPB 500 mg  Status:  Discontinued        500 mg 100 mL/hr over 60 Minutes Intravenous Every 12 hours 03/10/22 0409 03/12/22 1420   03/10/22 0500  doxycycline (VIBRAMYCIN) 100 mg in sodium chloride 0.9 % 250 mL IVPB  Status:  Discontinued        100 mg 125 mL/hr over 120 Minutes Intravenous Every 12 hours 03/10/22 0409 03/12/22 1420   03/09/22 2315  metroNIDAZOLE (FLAGYL) IVPB 500 mg         500 mg 100 mL/hr over 60 Minutes Intravenous  Once 03/09/22 2308 03/10/22 0720   03/09/22 2300  cefTRIAXone (ROCEPHIN) 1 g in sodium chloride 0.9 % 100 mL IVPB        1 g 200 mL/hr over 30 Minutes Intravenous  Once 03/09/22 2253 03/10/22 0029        Subjective: Seen and examined at bedside and states that her headache comes and goes.  Continues to have some left buttock pain.  No nausea or vomiting.  Still has some abdominal discomfort as well but not as bad.  No other concerns or complaints at this time.  Objective: Vitals:   03/13/22 0559 03/13/22 1327 03/13/22 2147 03/14/22 0451  BP: 114/62 124/82 116/74 (!) 105/54  Pulse: 90 94 84 77  Resp: 18 18 18 18   Temp: 97.7  F (36.5 C)  98.6 F (37 C) 98.1 F (36.7 C)  TempSrc: Oral  Oral Oral  SpO2: 97% 97% 100% 97%  Weight:      Height:        Intake/Output Summary (Last 24 hours) at 03/14/2022 1303 Last data filed at 03/14/2022 1000 Gross per 24 hour  Intake 714 ml  Output 1800 ml  Net -1086 ml   Filed Weights   03/09/22 1733  Weight: 107 kg    Examination: Physical Exam:  Constitutional: WN/WD obese African-American female currently no acute distress appears little uncomfortable Respiratory: Diminished to auscultation bilaterally with coarse breath sounds, no wheezing, rales, rhonchi or crackles. Normal respiratory effort and patient is not tachypenic. No accessory muscle use.  Unlabored breathing Cardiovascular: RRR, no murmurs / rubs / gallops. S1 and S2 auscultated. No extremity edema. .  Abdomen: Soft, a little-tender, distended secondary to body habitus.  Bowel sounds positive.  GU: Deferred. Musculoskeletal: No clubbing / cyanosis of digits/nails. No joint deformity upper and lower extremities. Skin: No rashes, lesions, ulcers on limited skin evaluation. No induration; Warm and dry.  Neurologic: CN 2-12 grossly intact with no focal deficits. Romberg sign and cerebellar reflexes not assessed.  Psychiatric:  Normal judgment and insight. Alert and oriented x 3. Normal mood and appropriate affect.   Data Reviewed: I have personally reviewed following labs and imaging studies  CBC: Recent Labs  Lab 03/10/22 0431 03/11/22 0035 03/12/22 0807 03/13/22 0441 03/14/22 0532  WBC 12.3* 11.7* 8.7 7.8 8.9  NEUTROABS  --   --  5.8 4.6 5.1  HGB 9.3* 8.6* 8.5* 9.1* 9.0*  HCT 29.5* 28.0* 28.1* 29.9* 29.4*  MCV 81.5 81.4 82.6 81.0 81.4  PLT 205 204 253 294 468   Basic Metabolic Panel: Recent Labs  Lab 03/09/22 1745 03/11/22 0035 03/11/22 0517 03/12/22 0521 03/13/22 0441 03/14/22 0532  NA 133* 137  --  139 141 140  K 3.7 3.1*  --  3.8 3.8 3.5  CL 102 107  --  111 113* 108  CO2 21* 22  --  20* 24 22  GLUCOSE 99 95  --  100* 93 115*  BUN <5* 5*  --  5* <5* 5*  CREATININE 0.88 0.85  --  0.77 0.80 0.81  CALCIUM 8.8* 7.9*  --  8.2* 8.5* 8.3*  MG  --   --  1.8 1.9 2.0 1.9  PHOS  --   --  2.5 2.7 4.2 4.9*   GFR: Estimated Creatinine Clearance: 141.8 mL/min (by C-G formula based on SCr of 0.81 mg/dL). Liver Function Tests: Recent Labs  Lab 03/09/22 1745 03/11/22 0035 03/12/22 0521 03/13/22 0441 03/14/22 0532  AST 19 21 17 20 25   ALT 23 28 21 19 21   ALKPHOS 76 71 70 62 60  BILITOT 0.5 0.4 0.4 0.5 0.2*  PROT 8.1 7.0 6.7 6.9 6.8  ALBUMIN 3.8 2.7* 2.8* 2.8* 2.7*   Recent Labs  Lab 03/09/22 1745  LIPASE <10*   No results for input(s): "AMMONIA" in the last 168 hours. Coagulation Profile: No results for input(s): "INR", "PROTIME" in the last 168 hours. Cardiac Enzymes: No results for input(s): "CKTOTAL", "CKMB", "CKMBINDEX", "TROPONINI" in the last 168 hours. BNP (last 3 results) No results for input(s): "PROBNP" in the last 8760 hours. HbA1C: No results for input(s): "HGBA1C" in the last 72 hours. CBG: No results for input(s): "GLUCAP" in the last 168 hours. Lipid Profile: No results for input(s): "CHOL", "HDL", "LDLCALC", "TRIG", "CHOLHDL", "LDLDIRECT"  in the last 72  hours. Thyroid Function Tests: No results for input(s): "TSH", "T4TOTAL", "FREET4", "T3FREE", "THYROIDAB" in the last 72 hours. Anemia Panel: Recent Labs    03/14/22 0532  VITAMINB12 277  FOLATE 10.5  FERRITIN 91  TIBC 260  IRON 29  RETICCTPCT 1.0   Sepsis Labs: Recent Labs  Lab 03/10/22 1208  LATICACIDVEN 1.2    Recent Results (from the past 240 hour(s))  Resp Panel by RT-PCR (Flu A&B, Covid) Anterior Nasal Swab     Status: None   Collection Time: 03/09/22  5:45 PM   Specimen: Anterior Nasal Swab  Result Value Ref Range Status   SARS Coronavirus 2 by RT PCR NEGATIVE NEGATIVE Final    Comment: (NOTE) SARS-CoV-2 target nucleic acids are NOT DETECTED.  The SARS-CoV-2 RNA is generally detectable in upper respiratory specimens during the acute phase of infection. The lowest concentration of SARS-CoV-2 viral copies this assay can detect is 138 copies/mL. A negative result does not preclude SARS-Cov-2 infection and should not be used as the sole basis for treatment or other patient management decisions. A negative result may occur with  improper specimen collection/handling, submission of specimen other than nasopharyngeal swab, presence of viral mutation(s) within the areas targeted by this assay, and inadequate number of viral copies(<138 copies/mL). A negative result must be combined with clinical observations, patient history, and epidemiological information. The expected result is Negative.  Fact Sheet for Patients:  EntrepreneurPulse.com.au  Fact Sheet for Healthcare Providers:  IncredibleEmployment.be  This test is no t yet approved or cleared by the [place] FDA and  has been authorized for detection and/or diagnosis of SARS-CoV-2 by FDA under an Emergency Use Authorization (EUA). This EUA will remain  in effect (meaning this test can be used) for the duration of the COVID-19 declaration under Section 564(b)(1) of the Act,  21 U.S.C.section 360bbb-3(b)(1), unless the authorization is terminated  or revoked sooner.       Influenza A by PCR NEGATIVE NEGATIVE Final   Influenza B by PCR NEGATIVE NEGATIVE Final    Comment: (NOTE) The Xpert Xpress SARS-CoV-2/FLU/RSV plus assay is intended as an aid in the diagnosis of influenza from Nasopharyngeal swab specimens and should not be used as a sole basis for treatment. Nasal washings and aspirates are unacceptable for Xpert Xpress SARS-CoV-2/FLU/RSV testing.  Fact Sheet for Patients: EntrepreneurPulse.com.au  Fact Sheet for Healthcare Providers: IncredibleEmployment.be  This test is not yet approved or cleared by the [place] FDA and has been authorized for detection and/or diagnosis of SARS-CoV-2 by FDA under an Emergency Use Authorization (EUA). This EUA will remain in effect (meaning this test can be used) for the duration of the COVID-19 declaration under Section 564(b)(1) of the Act, 21 U.S.C. section 360bbb-3(b)(1), unless the authorization is terminated or revoked.  Performed at [facility], [address] [place], [place] [address]   Urine Culture     Status: Abnormal   Collection Time: 03/09/22 10:37 PM   Specimen: Urine, Clean Catch  Result Value Ref Range Status   Specimen Description   Final    URINE, CLEAN CATCH Performed at [facility], [address] [place], [place] [address]    Special Requests   Final    NONE Performed at [facility], [address], [place],  [address]    Culture MULTIPLE SPECIES PRESENT, SUGGEST RECOLLECTION (A)  Final   Report Status 03/11/2022 FINAL  Final  Wet prep, genital     Status:  None   Collection Time: 03/09/22 10:37 PM   Specimen: PATH Cytology Cervicovaginal Ancillary Only  Result Value Ref Range Status   Yeast Wet Prep HPF POC NONE SEEN NONE SEEN Final   Trich, Wet Prep NONE SEEN NONE  SEEN Final   Clue Cells Wet Prep HPF POC NONE SEEN NONE SEEN Final   WBC, Wet Prep HPF POC <10 <10 Final   Sperm NONE SEEN  Final    Comment: Performed at [facility], [address], [place], [place] [address]  Culture, blood (Routine X 2) w Reflex to ID Panel     Status: None (Preliminary result)   Collection Time: 03/10/22 11:55 AM   Specimen: BLOOD  Result Value Ref Range Status   Specimen Description   Final    BLOOD LEFT ANTECUBITAL Performed at [facility] [address], [place], [place] [address]    Special Requests   Final    BOTTLES DRAWN AEROBIC AND ANAEROBIC Blood Culture results may not be optimal due to an inadequate volume of blood received in culture bottles Performed at [facility] [address] [place], [place] [address]    Culture   Final    NO GROWTH 4 DAYS Performed at [facility], [place] [address], [place], [place] [address]    Report Status PENDING  Incomplete  Culture, blood (Routine X 2) w Reflex to ID Panel     Status: None (Preliminary result)   Collection Time: 03/10/22 12:07 PM   Specimen: BLOOD  Result Value Ref Range Status   Specimen Description   Final    BLOOD LEFT ANTECUBITAL Performed at [facility] [address], [place], [place] [address]    Special Requests   Final    BOTTLES DRAWN AEROBIC AND ANAEROBIC Blood Culture results may not be optimal due to an inadequate volume of blood received in culture bottles Performed at [facility] [address], [place], [place] [address]    Culture   Final    NO GROWTH 4 DAYS Performed at [facility], [place] [address] [place], [place] [address]    Report Status PENDING  Incomplete  Culture, blood (Routine X 2) w Reflex to ID Panel     Status: None (Preliminary result)   Collection Time: 03/11/22  7:14 PM   Specimen: BLOOD  Result Value Ref Range Status   Specimen Description    Final    BLOOD BLOOD RIGHT ARM Performed at [facility] [address] [place], [place] [address]    Special Requests   Final    IN PEDIATRIC BOTTLE Blood Culture adequate volume Performed at [facility] [address], [place], [place] [address]    Culture   Final    NO GROWTH 3 DAYS Performed at [facility], [place] [address] [place], [place] [address]    Report Status PENDING  Incomplete  Culture, blood (Routine X 2) w Reflex to ID Panel     Status: None (Preliminary result)   Collection Time: 03/12/22  5:21 AM   Specimen: BLOOD  Result Value Ref Range Status   Specimen Description   Final    BLOOD BLOOD RIGHT ARM Performed at [facility] [address] [place], [place] [address]    Special Requests   Final    BOTTLES DRAWN AEROBIC AND ANAEROBIC Blood Culture adequate volume Performed at [facility],  [place] [address], [place], [place] [address]    Culture   Final    NO GROWTH 2 DAYS Performed at [facility] [address] [place], [place] [address]    Report Status PENDING  Incomplete     Radiology Studies: MR PELVIS W WO CONTRAST  Result Date: 03/13/2022 CLINICAL DATA:  Anorectal abscess EXAM: MRI PELVIS WITHOUT AND WITH CONTRAST TECHNIQUE: Multiplanar multisequence MR imaging of the pelvis was performed both before and after administration of intravenous contrast. CONTRAST:  37m GADAVIST GADOBUTROL 1 MMOL/ML IV SOLN COMPARISON:  CT abdomen/pelvis dated 03/09/2022 FINDINGS: Urinary Tract:  Bladder is within normal limits. Bowel: Sigmoid colon is decompressed. Rectal wall thickening with pericolonic inflammatory changes. This appearance is suspicious for infectious/inflammatory proctocolitis. Vascular/Lymphatic: No evidence of aneurysm. Progressive presacral and perirectal lymphadenopathy measuring up to 10 mm short axis (series 7/image 10), likely inflammatory. Reproductive: Uterus  and bilateral ovaries are within normal limits. Other: Small volume pelvic ascites, progressive. Presacral fluid with inflammatory change. Interval development of a 2.8 x 3.7 x 4.4 cm complex fluid collection in the left perirectal space (series 7/image 20), progressive and more organized when compared to recent CT, compatible with developing abscess. Musculoskeletal: No focal osseous lesions. IMPRESSION: Progressive rectal wall thickening, suspicious for infectious/inflammatory proctocolitis. Developing 4.4 cm left perirectal abscess, new/progressive. Associated progressive lymphadenopathy and small volume pelvic ascites. Electronically Signed   By: SJulian HyM.D.   On: 03/13/2022 18:24     Scheduled Meds:  bupivacaine liposome  20 mL Infiltration Once   Chlorhexidine Gluconate Cloth  6 each Topical Once   And   Chlorhexidine Gluconate Cloth  6 each Topical Once   [MAR Hold] dicyclomine  10 mg Oral TID AC   [MAR Hold] diltiazem   Topical TID   [MAR Hold] doxycycline  100 mg Oral Q12H   [MAR Hold] FLUoxetine  40 mg Oral Daily   [MAR Hold] gabapentin  300 mg Oral BID   [MAR Hold] zonisamide  100 mg Oral QHS   Continuous Infusions:  [MAR Hold] piperacillin-tazobactam (ZOSYN)  IV 3.375 g (03/14/22 1035)   sodium chloride 0.9 % with cefoTEtan (CEFOTAN) ADS Med      LOS: 4 days   ORaiford Noble DO [facility] Available via Epic secure chat 7am-7pm After these hours, please refer to coverage provider listed on [email or web address] 03/14/2022, 1:03 PM

## 2022-03-14 NOTE — Op Note (Signed)
03/14/2022  1:29 PM  PATIENT:  Jacqueline Gonzalez  21 y.o. female  Patient Care Team: Donney Dice, DO as PCP - General (Family Medicine) Greg Cutter, LCSW as [place] (Licensed Clinical Social Worker)  PRE-OPERATIVE DIAGNOSIS:  SUPRASPHINCTERIC RECTAL ABSCESS  POST-OPERATIVE DIAGNOSIS:  SUPRALEVATOR RECTAL ABSCESS  PROCEDURE:   INCISION & DRAINAGE SUPRALEVATOR RECTAL ABSCESS RECTAL BIOPSY ANORECTAL EXAMINATION UNDER ANESTHESIA  SURGEON:  Adin Hector, MD  ANESTHESIA:   General Anorectal & Local field block (0.25% bupivacaine with epinephrine mixed with Liposomal bupivacaine (Experel)   EBL:  Total I/O In: 400 [I.V.:400] Out: 415 [Urine:400; Blood:15].  See operative record  Delay start of Pharmacological VTE agent (>24hrs) due to surgical blood loss or risk of bleeding:  NO  DRAINS: NONE  SPECIMEN:    RECTAL WALL for pathology Abscess for Microbiology  DISPOSITION OF SPECIMEN:  PATHOLOGY  COUNTS:  YES  PLAN OF CARE: Discharge home after PACU  PATIENT DISPOSITION:  PACU - hemodynamically stable.  INDICATION: Woman coming in with abdominal pelvic pain.  Found to have inflamed rectum.  Placed on antibiotics.  Follow-up study concerning for abscess.  Surgical consultation requested.  Looks like it was super sphincteric.  I recommended anorectal examination under esthesia with drainage.  Possible need for seton placement.  The anatomy and physiology of the region was discussed. The pathophysiology of subcutaneous abscess formation with progression to fasciitis & sepsis was discussed.  Need for incision, drainage, debridement discussed.  I stressed good hygiene & need for repeated wound care.  Possible redebridement / reoperation was discussed as well. Possibility of recurrence was discussed.   Risks of bleeding, infection, abscess, leak, injury to other organs, need for repair of tissues / organs, need for further treatment, heart  attack, death, and other risks were discussed.  Benefits, alternatives were discussed. I noted a good likelihood this will help address the problem.  Questions answered.  The patient agrees to proceed.   OR FINDINGS: Superior levator left posterior rectal wall abscess 4-8 cm from anal verge.  Contained.  Frankly purulent.  Aspiration and purulence sent for culture.  Rectal wall biopsy done to rule out inflammatory bowel disease or other etiologies.  No evidence of any fistula.  No external opening.  No fissure.  No abscess.  Moderate proctitis and friability.  No stricturing.  No mass or tumor felt to 9 cm digitally nor visually.  No condylomata.  DESCRIPTION:   Informed consent was confirmed. Patient underwent general anesthesia without difficulty. Patient was placed into  lithotomy positioning.  The perianal region was prepped and draped in sterile fashion. Surgical time-out confirmed our plan.  I did digital rectal examination and then transitioned over to anoscopy to get a sense of the anatomy.  Findings noted above.  I could palpate an obvious bulging on the left posterior distal rectal wall about 4 cm above the anal verge.  This correlated with the MRI.  I was able to get an 18-gauge needle and aspirate frank purulence.  I was able to bluntly open the region and evacuated a moderately large abscess about the size of a chicken egg.  It seemed contained.  Apex around 8 cm along the presacral space.  Evacuated well.  Given the atypical etiology of this and concern for proctocolitis, I excised rectal wall at this opening to send tissue for biopsy.  That allowed the area to remain open and drain from the presacral space into the rectum.  I did cautery  to assure hemostasis on the edges although the rectum overall was friable.  I did reexamination and hemostasis was improved.  Again saw no external opening suspicious for some transfer enteric or intersphincteric fistula.  Did not see any chronic scarring  concerning for chronic Crohn's.  Fistulous disease.  No mass tumor or other concerns.    Fluffed gauze was on-laid over the perianal region.  No packing done.  Patient is being extubated go to go to the recovery room.  I suspect this is some type of stercoral rectal perforation given her severe chronic constipation more than anything else but we will see.  Follow-up on pathology and microbiology reports.  Continue antibiotics x5 days after drainage.  Would recommend piperacillin/tazobactam  I discussed operative findings, updated the patient's status, discussed probable steps to recovery, and gave postoperative recommendations to the patient's mother, Jacqueline Gonzalez .  Recommendations were made.  Questions were answered.  She expressed understanding & appreciation.  Adin Hector, M.D., F.A.C.S. Gastrointestinal and Minimally Invasive Surgery [facility] [address] [place], [place] [address] [phone] Main / Paging

## 2022-03-14 NOTE — Anesthesia Preprocedure Evaluation (Signed)
Anesthesia Evaluation  Patient identified by MRN, date of birth, ID band Patient awake    Reviewed: Allergy & Precautions, H&P , NPO status , Patient's Chart, lab work & pertinent test results  Airway Mallampati: II   Neck ROM: full    Dental   Pulmonary neg pulmonary ROS,    breath sounds clear to auscultation       Cardiovascular hypertension,  Rhythm:regular Rate:Normal     Neuro/Psych  Headaches, PSYCHIATRIC DISORDERS Anxiety Depression    GI/Hepatic GERD  ,IBS   Endo/Other    Renal/GU      Musculoskeletal   Abdominal   Peds  Hematology  (+) Blood dyscrasia, anemia ,   Anesthesia Other Findings   Reproductive/Obstetrics                             Anesthesia Physical Anesthesia Plan  ASA: 2  Anesthesia Plan: General   Post-op Pain Management:    Induction: Intravenous  PONV Risk Score and Plan: 3 and Ondansetron, Dexamethasone, Midazolam and Treatment may vary due to age or medical condition  Airway Management Planned: LMA  Additional Equipment:   Intra-op Plan:   Post-operative Plan: Extubation in OR  Informed Consent: I have reviewed the patients History and Physical, chart, labs and discussed the procedure including the risks, benefits and alternatives for the proposed anesthesia with the patient or authorized representative who has indicated his/her understanding and acceptance.     Dental advisory given  Plan Discussed with: CRNA, Anesthesiologist and Surgeon  Anesthesia Plan Comments:         Anesthesia Quick Evaluation

## 2022-03-14 NOTE — Anesthesia Procedure Notes (Signed)
Procedure Name: LMA Insertion Date/Time: 03/14/2022 12:47 PM  Performed by: Raenette Rover, CRNAPre-anesthesia Checklist: Patient identified, Emergency Drugs available, Suction available and Patient being monitored Patient Re-evaluated:Patient Re-evaluated prior to induction Oxygen Delivery Method: Circle system utilized Preoxygenation: Pre-oxygenation with 100% oxygen Induction Type: IV induction Ventilation: Mask ventilation without difficulty LMA: LMA inserted LMA Size: 4.0 Tube type: Oral Number of attempts: 1 Placement Confirmation: positive ETCO2 and breath sounds checked- equal and bilateral Tube secured with: Tape Dental Injury: Teeth and Oropharynx as per pre-operative assessment

## 2022-03-15 DIAGNOSIS — D649 Anemia, unspecified: Secondary | ICD-10-CM | POA: Diagnosis not present

## 2022-03-15 DIAGNOSIS — K529 Noninfective gastroenteritis and colitis, unspecified: Secondary | ICD-10-CM | POA: Diagnosis not present

## 2022-03-15 DIAGNOSIS — R933 Abnormal findings on diagnostic imaging of other parts of digestive tract: Secondary | ICD-10-CM | POA: Diagnosis not present

## 2022-03-15 DIAGNOSIS — K602 Anal fissure, unspecified: Secondary | ICD-10-CM | POA: Diagnosis not present

## 2022-03-15 LAB — CBC
HCT: 31.7 % — ABNORMAL LOW (ref 36.0–46.0)
Hemoglobin: 9.5 g/dL — ABNORMAL LOW (ref 12.0–15.0)
MCH: 24.7 pg — ABNORMAL LOW (ref 26.0–34.0)
MCHC: 30 g/dL (ref 30.0–36.0)
MCV: 82.6 fL (ref 80.0–100.0)
Platelets: 354 10*3/uL (ref 150–400)
RBC: 3.84 MIL/uL — ABNORMAL LOW (ref 3.87–5.11)
RDW: 16.1 % — ABNORMAL HIGH (ref 11.5–15.5)
WBC: 12.5 10*3/uL — ABNORMAL HIGH (ref 4.0–10.5)
nRBC: 0 % (ref 0.0–0.2)

## 2022-03-15 LAB — COMPREHENSIVE METABOLIC PANEL
ALT: 25 U/L (ref 0–44)
AST: 28 U/L (ref 15–41)
Albumin: 2.9 g/dL — ABNORMAL LOW (ref 3.5–5.0)
Alkaline Phosphatase: 65 U/L (ref 38–126)
Anion gap: 7 (ref 5–15)
BUN: 10 mg/dL (ref 6–20)
CO2: 24 mmol/L (ref 22–32)
Calcium: 8.7 mg/dL — ABNORMAL LOW (ref 8.9–10.3)
Chloride: 107 mmol/L (ref 98–111)
Creatinine, Ser: 0.66 mg/dL (ref 0.44–1.00)
GFR, Estimated: >60 mL/min (ref >60)
Glucose, Bld: 120 mg/dL — ABNORMAL HIGH (ref 70–99)
Potassium: 4.2 mmol/L (ref 3.5–5.1)
Sodium: 138 mmol/L (ref 135–145)
Total Bilirubin: 0.3 mg/dL (ref 0.3–1.2)
Total Protein: 7.6 g/dL (ref 6.5–8.1)

## 2022-03-15 LAB — CULTURE, BLOOD (ROUTINE X 2)
Culture: NO GROWTH
Culture: NO GROWTH

## 2022-03-15 LAB — MAGNESIUM: Magnesium: 2.2 mg/dL (ref 1.7–2.4)

## 2022-03-15 LAB — PHOSPHORUS: Phosphorus: 4.4 mg/dL (ref 2.5–4.6)

## 2022-03-15 NOTE — Progress Notes (Signed)
Jacqueline Gonzalez [id number] 11/18/2000  CARE TEAM:  PCP: Donney Dice, DO  Outpatient Care Team: Patient Care Team: Donney Dice, DO as PCP - General (Family Medicine) Greg Cutter, LCSW as [place] Management (Licensed Clinical Social Worker)  Inpatient Treatment Team: Treatment Team: Attending Provider: Kerney Elbe, DO; Rounding Team: Fatima Blank, MD; Consulting Physician: Edison Pace, Md, MD; Utilization Review: Claudie Leach, RN; Registered Nurse: Marney Doctor, RN; Technician: Constance Goltz, NT; Registered Nurse: Marlis Edelson, RN   Problem List:   Principal Problem:   Proctocolitis Active Problems:   Constipation, chronic   Generalized anxiety disorder   Chronic anemia   UTI (urinary tract infection)   Rectal bleeding   Anal fissure   Abnormal CT scan, colon   Rectal pain   Rectal abscess supralevator s/p I&D 03/14/2022   1 Day Post-Op  03/14/2022  POST-OPERATIVE DIAGNOSIS:  SUPRALEVATOR RECTAL ABSCESS   PROCEDURE:   INCISION & DRAINAGE SUPRALEVATOR RECTAL ABSCESS RECTAL BIOPSY ANORECTAL EXAMINATION UNDER ANESTHESIA   SURGEON:  Adin Hector, MD  OR FINDINGS: Superior levator left posterior rectal wall abscess 4-8 cm from anal verge.  Contained.  Frankly purulent.  Aspiration and purulence sent for culture.  Rectal wall biopsy done to rule out inflammatory bowel disease or other etiologies.   No evidence of any fistula.  No external opening.  No fissure.  No abscess.  Moderate proctitis and friability.  No stricturing.  No mass or tumor felt to 9 cm digitally nor visually.  No condylomata.  Assessment  Recovering  [facility] Stay = 5 days)  Plan:  -Antibiotics x5 days.  Continue IV.  More likely can transition to Augmentin at discharge. -Solid diet. -Follow-up on pathology.  Hopefully no evidence of inflammatory bowel disease.  We will see. -Aggressive bowel regimen.  I strongly suspect she had a stercoral rectal ulcer  perforation that led to the abscess.  MiraLAX double dose twice daily until she is moving her bowels.  -VTE prophylaxis- SCDs, etc -mobilize as tolerated to help recovery  Disposition: To be determined.       I reviewed hospitalist notes, last 24 h vitals and pain scores, last 48 h intake and output, last 24 h labs and trends, and last 24 h imaging results. I have reviewed this patient's available data, including medical history, events of note, test results, etc as part of my evaluation.  A significant portion of that time was spent in counseling.  Care during the described time interval was provided by me.  This care required moderate level of medical decision making.  03/15/2022    Subjective: (Chief complaint)  Leg and gluteal pain has gone away but still with some perianal pain.  Trying solid diet.  Emily in room.  Objective:  Vital signs:  Vitals:   03/14/22 2033 03/14/22 2216 03/15/22 0156 03/15/22 0558  BP: 111/70 133/84 115/70 119/64  Pulse: 61 62 61 (!) 53  Resp: 18 18 18 18   Temp: (!) 97.5 F (36.4 C) 97.8 F (36.6 C) 97.7 F (36.5 C) 97.6 F (36.4 C)  TempSrc: Oral Oral Oral Oral  SpO2: 98% 99% 100% 98%  Weight:      Height:        Last BM Date : 03/14/22  Intake/Output   Yesterday:  08/26 0701 - 08/27 0700 In: 851.1 [P.O.:340; I.V.:400; IV Piggyback:111.1] Out: 7425 [Urine:1200; Blood:15] This shift:  No intake/output data recorded.  Bowel function:  Flatus: YES  BM:  No  Drain: (No drain)   Physical Exam:  General: Pt awake/alert in no acute distress Eyes: PERRL, normal EOM.  Sclera clear.  No icterus Neuro: CN II-XII intact w/o focal sensory/motor deficits. Lymph: No head/neck/groin lymphadenopathy Psych:  No delerium/psychosis/paranoia.  Oriented x 4 HENT: Normocephalic, Mucus membranes moist.  No thrush Neck: Supple, No tracheal deviation.  No obvious thyromegaly Chest: No pain to chest wall compression.  Good respiratory  excursion.  No audible wheezing CV:  Pulses intact.  Regular rhythm.  No major extremity edema MS: Normal AROM mjr joints.  No obvious deformity Abdomen: Soft.  Nondistended.  Mildly tender at incisions only.  No evidence of peritonitis.  No incarcerated hernias.  Rectal deferred.  Ext:   No deformity.  No mjr edema.  No cyanosis Skin: No petechiae / purpurea.  No major sores.  Warm and dry    Results:   Cultures: Recent Results (from the past 720 hour(s))  Resp Panel by RT-PCR (Flu A&B, Covid) Anterior Nasal Swab     Status: None   Collection Time: 03/09/22  5:45 PM   Specimen: Anterior Nasal Swab  Result Value Ref Range Status   SARS Coronavirus 2 by RT PCR NEGATIVE NEGATIVE Final    Comment: (NOTE) SARS-CoV-2 target nucleic acids are NOT DETECTED.  The SARS-CoV-2 RNA is generally detectable in upper respiratory specimens during the acute phase of infection. The lowest concentration of SARS-CoV-2 viral copies this assay can detect is 138 copies/mL. A negative result does not preclude SARS-Cov-2 infection and should not be used as the sole basis for treatment or other patient management decisions. A negative result may occur with  improper specimen collection/handling, submission of specimen other than nasopharyngeal swab, presence of viral mutation(s) within the areas targeted by this assay, and inadequate number of viral copies(<138 copies/mL). A negative result must be combined with clinical observations, patient history, and epidemiological information. The expected result is Negative.  Fact Sheet for Patients:  EntrepreneurPulse.com.au  Fact Sheet for Healthcare Providers:  IncredibleEmployment.be  This test is no t yet approved or cleared by the [place] FDA and  has been authorized for detection and/or diagnosis of SARS-CoV-2 by FDA under an Emergency Use Authorization (EUA). This EUA will remain  in effect (meaning this  test can be used) for the duration of the COVID-19 declaration under Section 564(b)(1) of the Act, 21 U.S.C.section 360bbb-3(b)(1), unless the authorization is terminated  or revoked sooner.       Influenza A by PCR NEGATIVE NEGATIVE Final   Influenza B by PCR NEGATIVE NEGATIVE Final    Comment: (NOTE) The Xpert Xpress SARS-CoV-2/FLU/RSV plus assay is intended as an aid in the diagnosis of influenza from Nasopharyngeal swab specimens and should not be used as a sole basis for treatment. Nasal washings and aspirates are unacceptable for Xpert Xpress SARS-CoV-2/FLU/RSV testing.  Fact Sheet for Patients: EntrepreneurPulse.com.au  Fact Sheet for Healthcare Providers: IncredibleEmployment.be  This test is not yet approved or cleared by the [place] FDA and has been authorized for detection and/or diagnosis of SARS-CoV-2 by FDA under an Emergency Use Authorization (EUA). This EUA will remain in effect (meaning this test can be used) for the duration of the COVID-19 declaration under Section 564(b)(1) of the Act, 21 U.S.C. section 360bbb-3(b)(1), unless the authorization is terminated or revoked.  Performed at [facility], [address], [place], [place] [address]   Urine Culture     Status: Abnormal   Collection Time: 03/09/22 10:37 PM  Specimen: Urine, Clean Catch  Result Value Ref Range Status   Specimen Description   Final    URINE, CLEAN CATCH Performed at [facility], [address] [place], [place] [address]    Special Requests   Final    NONE Performed at [facility], [address] [place], [place] [address]    Culture MULTIPLE SPECIES PRESENT, SUGGEST RECOLLECTION (A)  Final   Report Status 03/11/2022 FINAL  Final  Wet prep, genital     Status: None   Collection Time: 03/09/22 10:37 PM   Specimen: PATH Cytology Cervicovaginal Ancillary Only  Result Value Ref  Range Status   Yeast Wet Prep HPF POC NONE SEEN NONE SEEN Final   Trich, Wet Prep NONE SEEN NONE SEEN Final   Clue Cells Wet Prep HPF POC NONE SEEN NONE SEEN Final   WBC, Wet Prep HPF POC <10 <10 Final   Sperm NONE SEEN  Final    Comment: Performed at [facility], [address] [place], [place] [address]  Culture, blood (Routine X 2) w Reflex to ID Panel     Status: None (Preliminary result)   Collection Time: 03/10/22 11:55 AM   Specimen: BLOOD  Result Value Ref Range Status   Specimen Description   Final    BLOOD LEFT ANTECUBITAL Performed at [facility], [place] [address], [place], [place] [address]    Special Requests   Final    BOTTLES DRAWN AEROBIC AND ANAEROBIC Blood Culture results may not be optimal due to an inadequate volume of blood received in culture bottles Performed at [facility] [address] [place], [place] [address]    Culture   Final    NO GROWTH 4 DAYS Performed at [facility], [place] [address] [place], [place] [address]    Report Status PENDING  Incomplete  Culture, blood (Routine X 2) w Reflex to ID Panel     Status: None (Preliminary result)   Collection Time: 03/10/22 12:07 PM   Specimen: BLOOD  Result Value Ref Range Status   Specimen Description   Final    BLOOD LEFT ANTECUBITAL Performed at [facility] [address] [place], [place] [address]    Special Requests   Final    BOTTLES DRAWN AEROBIC AND ANAEROBIC Blood Culture results may not be optimal due to an inadequate volume of blood received in culture bottles Performed at [facility] [address], [place], [place] [address]    Culture   Final    NO GROWTH 4 DAYS Performed at [facility], [place] [address], [place], [place] [address]    Report Status PENDING  Incomplete  Culture, blood (Routine X 2) w Reflex to ID Panel     Status: None (Preliminary result)   Collection Time:  03/11/22  7:14 PM   Specimen: BLOOD  Result Value Ref Range Status   Specimen Description   Final    BLOOD BLOOD RIGHT ARM Performed at [facility] [address] [place], [place] [address]    Special Requests   Final    IN PEDIATRIC BOTTLE Blood Culture adequate volume Performed at [facility] [address], [place], [place] [address]    Culture   Final    NO GROWTH 3 DAYS Performed at [facility], [place] [address] [place], [place] [address]    Report Status PENDING  Incomplete  Culture,  blood (Routine X 2) w Reflex to ID Panel     Status: None (Preliminary result)   Collection Time: 03/12/22  5:21 AM   Specimen: BLOOD  Result Value Ref Range Status   Specimen Description   Final    BLOOD BLOOD RIGHT ARM Performed at [facility] [address], [place], [place] [address]    Special Requests   Final    BOTTLES DRAWN AEROBIC AND ANAEROBIC Blood Culture adequate volume Performed at [facility] [address] [place], [place] [address]    Culture   Final    NO GROWTH 2 DAYS Performed at [facility] [address], [place], [place] [address]    Report Status PENDING  Incomplete    Labs: Results for orders placed or performed during the hospital encounter of 03/09/22 (from the past 48 hour(s))  CBC with Differential/Platelet     Status: Abnormal   Collection Time: 03/14/22  5:32 AM  Result Value Ref Range   WBC 8.9 4.0 - 10.5 K/uL   RBC 3.61 (L) 3.87 - 5.11 MIL/uL   Hemoglobin 9.0 (L) 12.0 - 15.0 g/dL   HCT 29.4 (L) 36.0 - 46.0 %   MCV 81.4 80.0 - 100.0 fL   MCH 24.9 (L) 26.0 - 34.0 pg   MCHC 30.6 30.0 - 36.0 g/dL   RDW 16.0 (H) 11.5 - 15.5 %   Platelets 324 150 - 400 K/uL   nRBC 0.3 (H) 0.0 - 0.2 %   Neutrophils Relative % 58 %   Neutro Abs 5.1 1.7 - 7.7 K/uL   Lymphocytes Relative 28 %   Lymphs Abs 2.5 0.7 - 4.0 K/uL   Monocytes Relative 9 %   Monocytes Absolute 0.8  0.1 - 1.0 K/uL   Eosinophils Relative 2 %   Eosinophils Absolute 0.2 0.0 - 0.5 K/uL   Basophils Relative 0 %   Basophils Absolute 0.0 0.0 - 0.1 K/uL   WBC Morphology MILD LEFT SHIFT (1-5% METAS, OCC MYELO, OCC BANDS)    Immature Granulocytes 3 %   Abs Immature Granulocytes 0.26 (H) 0.00 - 0.07 K/uL    Comment: Performed at [facility], [place] [address], [place], [place] [address]  Comprehensive metabolic panel     Status: Abnormal   Collection Time: 03/14/22  5:32 AM  Result Value Ref Range   Sodium 140 135 - 145 mmol/L   Potassium 3.5 3.5 - 5.1 mmol/L   Chloride 108 98 - 111 mmol/L   CO2 22 22 - 32 mmol/L   Glucose, Bld 115 (H) 70 - 99 mg/dL    Comment: Glucose reference range applies only to samples taken after fasting for at least 8 hours.   BUN 5 (L) 6 - 20 mg/dL   Creatinine, Ser 0.81 0.44 - 1.00 mg/dL   Calcium 8.3 (L) 8.9 - 10.3 mg/dL   Total Protein 6.8 6.5 - 8.1 g/dL   Albumin 2.7 (L) 3.5 - 5.0 g/dL   AST 25 15 - 41 U/L   ALT 21 0 - 44 U/L   Alkaline Phosphatase 60 38 - 126 U/L   Total Bilirubin 0.2 (L) 0.3 - 1.2 mg/dL   GFR, Estimated >60 >60 mL/min    Comment: (NOTE) Calculated using the CKD-EPI Creatinine Equation (2021)    Anion gap 10 5 - 15    Comment: Performed at [facility], [place] [address], [place], [place] [address]  Magnesium  Status: None   Collection Time: 03/14/22  5:32 AM  Result Value Ref Range   Magnesium 1.9 1.7 - 2.4 mg/dL    Comment: Performed at [facility], [place] [address] [place], [place] [address]  Phosphorus     Status: Abnormal   Collection Time: 03/14/22  5:32 AM  Result Value Ref Range   Phosphorus 4.9 (H) 2.5 - 4.6 mg/dL    Comment: Performed at [facility], [name] [address], [place], [place] [address]  Vitamin B12     Status: None   Collection Time: 03/14/22  5:32 AM  Result Value Ref Range   Vitamin B-12 277 180 - 914 pg/mL    Comment: (NOTE) This  assay is not validated for testing neonatal or myeloproliferative syndrome specimens for Vitamin B12 levels. Performed at [facility], [place] [address], [place], [place] [address]   Folate     Status: None   Collection Time: 03/14/22  5:32 AM  Result Value Ref Range   Folate 10.5 >5.9 ng/mL    Comment: Performed at [facility], [place] [address] [place], [place] [address]  Iron and TIBC     Status: None   Collection Time: 03/14/22  5:32 AM  Result Value Ref Range   Iron 29 28 - 170 ug/dL   TIBC 260 250 - 450 ug/dL   Saturation Ratios 11 10.4 - 31.8 %   UIBC 231 ug/dL    Comment: Performed at [facility], [place] [address] [place], [place] [address]  Ferritin     Status: None   Collection Time: 03/14/22  5:32 AM  Result Value Ref Range   Ferritin 91 11 - 307 ng/mL    Comment: Performed at [facility], [place] [address] [place], [place] [address]  Reticulocytes     Status: Abnormal   Collection Time: 03/14/22  5:32 AM  Result Value Ref Range   Retic Ct Pct 1.0 0.4 - 3.1 %   RBC. 3.61 (L) 3.87 - 5.11 MIL/uL   Retic Count, Absolute 35.7 19.0 - 186.0 K/uL   Immature Retic Fract 44.2 (H) 2.3 - 15.9 %    Comment: Performed at [facility], [place] [address], [place], [place] [address]    Imaging / Studies: MR PELVIS W WO CONTRAST  Result Date: 03/13/2022 CLINICAL DATA:  Anorectal abscess EXAM: MRI PELVIS WITHOUT AND WITH CONTRAST TECHNIQUE: Multiplanar multisequence MR imaging of the pelvis was performed both before and after administration of intravenous contrast. CONTRAST:  38m GADAVIST GADOBUTROL 1 MMOL/ML IV SOLN COMPARISON:  CT abdomen/pelvis dated 03/09/2022 FINDINGS: Urinary Tract:  Bladder is within normal limits. Bowel: Sigmoid colon is decompressed. Rectal wall thickening with pericolonic inflammatory changes. This appearance is suspicious for infectious/inflammatory proctocolitis.  Vascular/Lymphatic: No evidence of aneurysm. Progressive presacral and perirectal lymphadenopathy measuring up to 10 mm short axis (series 7/image 10), likely inflammatory. Reproductive: Uterus and bilateral ovaries are within normal limits. Other: Small volume pelvic ascites, progressive. Presacral fluid with inflammatory change. Interval development of a 2.8 x 3.7 x 4.4 cm complex fluid collection in the left perirectal space (series 7/image 20), progressive and more organized when compared to recent CT, compatible with developing abscess. Musculoskeletal: No focal osseous lesions. IMPRESSION: Progressive rectal wall thickening, suspicious for infectious/inflammatory proctocolitis. Developing 4.4 cm left perirectal abscess, new/progressive. Associated progressive lymphadenopathy and small volume pelvic ascites. Electronically Signed   By: SJulian HyM.D.   On: 03/13/2022 18:24  Medications / Allergies: per chart  Antibiotics: Anti-infectives (From admission, onward)    Start     Dose/Rate Route Frequency Ordered Stop   03/14/22 1235  sodium chloride 0.9 % with cefoTEtan (CEFOTAN) ADS Med  Status:  Discontinued       Note to Pharmacy: Hedy Camara L: cabinet override      03/14/22 1235 03/14/22 1338   03/14/22 1015  cefoTEtan (CEFOTAN) 2 g in sodium chloride 0.9 % 100 mL IVPB  Status:  Discontinued        2 g 200 mL/hr over 30 Minutes Intravenous On call to O.R. 03/14/22 0459 03/14/22 0927   03/14/22 1000  piperacillin-tazobactam (ZOSYN) IVPB 3.375 g        3.375 g 12.5 mL/hr over 240 Minutes Intravenous Every 8 hours 03/14/22 0836 03/19/22 0959   03/12/22 2200  doxycycline (VIBRA-TABS) tablet 100 mg        100 mg Oral Every 12 hours 03/12/22 1420 03/24/22 0959   03/10/22 2300  cefTRIAXone (ROCEPHIN) 1 g in sodium chloride 0.9 % 100 mL IVPB  Status:  Discontinued        1 g 200 mL/hr over 30 Minutes Intravenous Every 24 hours 03/10/22 0409 03/10/22 1159   03/10/22 2300  cefTRIAXone  (ROCEPHIN) 2 g in sodium chloride 0.9 % 100 mL IVPB  Status:  Discontinued        2 g 200 mL/hr over 30 Minutes Intravenous Every 24 hours 03/10/22 1159 03/12/22 1420   03/10/22 1100  metroNIDAZOLE (FLAGYL) IVPB 500 mg  Status:  Discontinued        500 mg 100 mL/hr over 60 Minutes Intravenous Every 12 hours 03/10/22 0409 03/12/22 1420   03/10/22 0500  doxycycline (VIBRAMYCIN) 100 mg in sodium chloride 0.9 % 250 mL IVPB  Status:  Discontinued        100 mg 125 mL/hr over 120 Minutes Intravenous Every 12 hours 03/10/22 0409 03/12/22 1420   03/09/22 2315  metroNIDAZOLE (FLAGYL) IVPB 500 mg        500 mg 100 mL/hr over 60 Minutes Intravenous  Once 03/09/22 2308 03/10/22 0720   03/09/22 2300  cefTRIAXone (ROCEPHIN) 1 g in sodium chloride 0.9 % 100 mL IVPB        1 g 200 mL/hr over 30 Minutes Intravenous  Once 03/09/22 2253 03/10/22 0029         Note: Portions of this report may have been transcribed using voice recognition software. Every effort was made to ensure accuracy; however, inadvertent computerized transcription errors may be present.   Any transcriptional errors that result from this process are unintentional.    Adin Hector, MD, FACS, MASCRS Esophageal, Gastrointestinal & Colorectal Surgery Robotic and Minimally Invasive Surgery  [facility]. [address], [place], [place] [address] [phone] Fax [phone] Main  CONTACT INFORMATION:  Weekday (9AM-5PM): Call [facility] main office at [phone]  Weeknight (5PM-9AM) or Weekend/Holiday: Check [email or web address] (password " [email or web address]") for General Surgery [facility] coverage  (Please, do not use SecureChat as it is not reliable communication to reach operating surgeons for immediate patient care)      03/15/2022  7:50 AM

## 2022-03-15 NOTE — Progress Notes (Addendum)
UNASSIGNED PATIENT  Subjective: Patient complains of rectal pain with loose stools this morning but denies any fever chills or rigors.  She had an incision and drainage of a supralevator rectal abscess with rectal biopsy done after anorectal exam under anesthesia yesterday.  Results of the surgical pathology are pending.  WBC count is elevated at 12.5 today hemoglobin 9.5 g/dL  Objective: Vital signs in last 24 hours: Temp:  [96.7 F (35.9 C)-97.8 F (36.6 C)] 97.7 F (36.5 C) (08/27 1030) Pulse Rate:  [53-89] 56 (08/27 1030) Resp:  [18-29] 18 (08/27 0558) BP: (107-143)/(56-90) 107/56 (08/27 1030) SpO2:  [95 %-100 %] 100 % (08/27 1030) Last BM Date : 03/14/22  Intake/Output from previous day: 08/26 0701 - 08/27 0700 In: 851.1 [P.O.:340; I.V.:400; IV Piggyback:111.1] Out: 1215 [Urine:1200; Blood:15] Intake/Output this shift: Total I/O In: 240 [P.O.:240] Out: -   General appearance: alert, cooperative, fatigued, no distress, and morbidly obese Resp: clear to auscultation bilaterally Cardio: regular rate and rhythm, S1, S2 normal, no murmur, click, rub or gallop GI: soft, non-tender; bowel sounds normal; no masses,  no organomegaly  Lab Results: Recent Labs    03/13/22 0441 03/14/22 0532 03/15/22 0755  WBC 7.8 8.9 12.5*  HGB 9.1* 9.0* 9.5*  HCT 29.9* 29.4* 31.7*  PLT 294 324 354   BMET Recent Labs    03/13/22 0441 03/14/22 0532 03/15/22 0816  NA 141 140 138  K 3.8 3.5 4.2  CL 113* 108 107  CO2 _0 GLUCOSE 93 115* 120*  BUN <5* 5* 10  CREATININE 0.80 0.81 0.66  CALCIUM 8.5* 8.3* 8.7*   LFT Recent Labs    03/15/22 0816  PROT 7.6  ALBUMIN 2.9*  AST 28  ALT 25  ALKPHOS 65  BILITOT 0.3   Studies/Results: MR PELVIS W WO CONTRAST  Result Date: 03/13/2022 CLINICAL DATA:  Anorectal abscess EXAM: MRI PELVIS WITHOUT AND WITH CONTRAST TECHNIQUE: Multiplanar multisequence MR imaging of the pelvis was performed both before and after administration of  intravenous contrast. CONTRAST:  73m GADAVIST GADOBUTROL 1 MMOL/ML IV SOLN COMPARISON:  CT abdomen/pelvis dated 03/09/2022 FINDINGS: Urinary Tract:  Bladder is within normal limits. Bowel: Sigmoid colon is decompressed. Rectal wall thickening with pericolonic inflammatory changes. This appearance is suspicious for infectious/inflammatory proctocolitis. Vascular/Lymphatic: No evidence of aneurysm. Progressive presacral and perirectal lymphadenopathy measuring up to 10 mm short axis (series 7/image 10), likely inflammatory. Reproductive: Uterus and bilateral ovaries are within normal limits. Other: Small volume pelvic ascites, progressive. Presacral fluid with inflammatory change. Interval development of a 2.8 x 3.7 x 4.4 cm complex fluid collection in the left perirectal space (series 7/image 20), progressive and more organized when compared to recent CT, compatible with developing abscess. Musculoskeletal: No focal osseous lesions. IMPRESSION: Progressive rectal wall thickening, suspicious for infectious/inflammatory proctocolitis. Developing 4.4 cm left perirectal abscess, new/progressive. Associated progressive lymphadenopathy and small volume pelvic ascites. Electronically Signed   By: SJulian HyM.D.   On: 03/13/2022 18:24    Medications: I have reviewed the patient's current medications. Prior to Admission:  Medications Prior to Admission  Medication Sig Dispense Refill Last Dose   acetaminophen (TYLENOL) 500 MG tablet Take 500 mg by mouth every 6 (six) hours as needed for headache (pain).   03/09/2022   Acetaminophen-Caff-Pyrilamine (MIDOL COMPLETE) 500-60-15 MG TABS Take 2 tablets by mouth daily as needed (cramps/pain).   2-3 mlonths ago   aspirin-acetaminophen-caffeine (EXCEDRIN MIGRAINE) 250-250-65 MG tablet Take 2 tablets by mouth every 6 (six) hours as  needed for headache or migraine.   03/05/2022   cetirizine (ZYRTEC) 10 MG tablet Take 10 mg by mouth daily as needed for allergies.   1-2  months ago   FLUoxetine (PROZAC) 40 MG capsule Take 1 capsule (40 mg total) by mouth daily. (Patient taking differently: Take 40 mg by mouth every morning.) 90 capsule 3 8/17 or 8/18   fluticasone (FLONASE) 50 MCG/ACT nasal spray Place 2 sprays into both nostrils daily. (Patient taking differently: Place 2 sprays into both nostrils daily as needed for allergies.) 16 g 6 1-2 months ago   Psyllium (METAMUCIL PO) Take 1 packet by mouth daily as needed (constipation).   1-2 months ago   triamcinolone cream (KENALOG) 0.1 % Apply 1 Application topically 2 (two) times daily as needed (eczema).   few weeks ago   [place] Medication Name: Diltiazem 2% gel -Using your index finger, apply a small amount of medication inside the rectum up to your first knuckle/joint three times daily x 6-8 weeks. (Patient not taking: Reported on 03/10/2022) 30 g 1 Not Taking   baclofen (LIORESAL) 10 MG tablet Take 10 mg by mouth. (Patient not taking: Reported on 03/10/2022)   Not Taking   COVID-19 At-Home Test KIT Take as directed on package instructions 1 kit 0    cyclobenzaprine (FLEXERIL) 10 MG tablet Take 0.5-1 tablets (5-10 mg total) by mouth 3 (three) times daily as needed for muscle spasms. (Patient not taking: Reported on 12/31/2021) 30 tablet 0 Not Taking   doxycycline (VIBRAMYCIN) 100 MG capsule Take 1 capsule (100 mg total) by mouth 2 (two) times daily. (Patient not taking: Reported on 03/10/2022) 20 capsule 0 Not Taking   fluconazole (DIFLUCAN) 150 MG tablet Take 1 tablet (150 mg total) by mouth once a week. (Patient not taking: Reported on 03/03/2022) 2 tablet 0 Not Taking   hyoscyamine (LEVSIN SL) 0.125 MG SL tablet Place 1 tablet (0.125 mg total) under the tongue every 4 (four) hours as needed. (Patient not taking: Reported on 03/10/2022) 30 tablet 2 Not Taking   ibuprofen (ADVIL) 600 MG tablet Take 1 tablet (600 mg total) by mouth every 8 (eight) hours as needed. (Patient not taking: Reported on  03/10/2022) 30 tablet 0 Not Taking   lubiprostone (AMITIZA) 8 MCG capsule Take 1 capsule (8 mcg total) by mouth 2 (two) times daily with a meal. (Patient not taking: Reported on 03/10/2022) 60 capsule 2 Not Taking   naproxen (NAPROSYN) 500 MG tablet Take 1 tablet (500 mg total) by mouth 2 (two) times daily with a meal. (Patient not taking: Reported on 03/03/2022) 30 tablet 0 Not Taking   ondansetron (ZOFRAN) 4 MG tablet Take 1 tablet (4 mg total) by mouth every 8 (eight) hours as needed for nausea or vomiting. (Patient not taking: Reported on 03/10/2022) 20 tablet 0 Not Taking   polyethylene glycol powder (GLYCOLAX/MIRALAX) 17 GM/SCOOP powder Take 17 g by mouth in the morning and at bedtime. (Patient not taking: Reported on 03/03/2022) 850 g 3 Not Taking   tranexamic acid (LYSTEDA) 650 MG TABS tablet Take 2 tablets (1,300 mg total) by mouth 3 (three) times daily. Take during menses for a maximum of five days (Patient not taking: Reported on 03/10/2022) 30 tablet 2 Not Taking   zonisamide (ZONEGRAN) 25 MG capsule Take 50 mg by mouth 2 (two) times daily. (Patient not taking: Reported on 03/10/2022)   Not Taking   Scheduled:  acetaminophen  1,000 mg Oral Q6H   dicyclomine  10 mg Oral TID AC   diltiazem   Topical TID   doxycycline  100 mg Oral Q12H   FLUoxetine  40 mg Oral Daily   gabapentin  300 mg Oral BID   lip balm   Topical BID   loratadine  10 mg Oral Daily   polyethylene glycol  34 g Oral BID   sodium chloride flush  3 mL Intravenous Q12H   zonisamide  100 mg Oral QHS   Continuous:  sodium chloride     lactated ringers     methocarbamol (ROBAXIN) IV     ondansetron (ZOFRAN) IV     piperacillin-tazobactam (ZOSYN)  IV 3.375 g (03/15/22 0810)   FKV:QOHCOB chloride, acetaminophen, alum & mag hydroxide-simeth, aspirin-acetaminophen-caffeine, diphenhydrAMINE, lactated ringers, magic mouthwash, menthol-cetylpyridinium, methocarbamol (ROBAXIN) IV, methocarbamol, morphine injection, ondansetron  (ZOFRAN) IV **OR** ondansetron (ZOFRAN) IV, ondansetron (ZOFRAN) IV, oxyCODONE, phenol, polyethylene glycol, prochlorperazine, sodium chloride flush  Assessment/Plan: 1) S/P incision and drainage for supralevator rectal abscess with rectal biopsy-surgical pathology is pending ? Infectious versus inflammatory.  Currently on doxycycline 2) Chronic anemia. 3) PID from chlamydia infection. 4) Morbid obesity. 5) History of constipation predominant IBS. 6) History of depression and anxiety. 7) GERD. 8) HTN.  LOS: 5 days   Jacqueline Gonzalez 03/15/2022, 11:23 AM

## 2022-03-15 NOTE — Progress Notes (Signed)
PROGRESS NOTE    Jacqueline Gonzalez  PJA:[id number] DOB: Sep 09, 2000 DOA: 03/09/2022 PCP: Donney Dice, DO   Brief Narrative:  HPI per Dr. Shela Leff on 03/10/22 Jacqueline Gonzalez is a 20 y.o. female with medical history significant of anemia, anxiety, chlamydia, IBS-C, anal fissure, depression, GERD, hypertension presented to ED with complaints of fever, abdominal pain, back pain, and hematochezia.  Febrile and tachycardic on arrival to the ED.  Labs showing WBC 13.6, hemoglobin 10.0 (stable), lipase and LFTs normal, UA with signs of infection (large amount of leukocytes and greater than 50 WBCs), urine pregnancy test negative, urine culture pending, COVID and influenza PCR negative, wet prep negative.  Testing for gonorrhea, chlamydia, syphilis, and HIV pending.  Pelvic exam negative for cervical motion tenderness.  CT abdomen pelvis showing findings concerning for proctocolitis versus PID. Patient was given ceftriaxone, metronidazole, and Tylenol.   Patient reports 3-day history of abdominal pain which is generalized but worse in the lower abdomen, buttock pain/rectal pain, low back pain, and fevers.  She denies any urinary symptoms.  Denies any vaginal discharge.  Does report intermittent hematochezia for the past 3 weeks.  Unable to discuss sexual history at this time as patient's mother and friend at bedside.  She has no other complaints.   **Interim History GI was consulted and she continues to have some abdominal discomfort and pain.  Still awaiting some studies.  Pain is not really improving.  Slightly worse in the left quadrant than right.  GI considering flexible sigmoidoscopy if GYN studies are negative however she tested positive for chlamydia and is having likely PID and proctocolitis from the chlamydia.  She can planes of left buttock pain that radiates down her legs we will get an MRI of with and without contrast of the pelvis to evaluate for any abscesses related to her infection.   This could be sciatica so we have initiated gabapentin and muscle relaxants.   MRI revealed a perirectal abscess so general surgery was consulted and she underwent surgical intervention with incision and drainage for her supralevator rectal abscess along with a rectal biopsy with an anorectal examination under anesthesia.  She has been broadened to IV Zosyn and general surgery recommends continue antibiotics for 5 days and transitioning to Augmentin and doxycycline at discharge.  Diet has been advanced to soft diet and she has been initiated on a bowel regimen and Dr. Johney Maine feels that she had a sterile coral rectal ulcer perforation that led to an abscess.   Assessment and Plan:  Sepsis secondary to chlamydia PID and chlamydia proctocolitis associated with Perirectal abscess -Febrile and tachycardic on arrival to the ED.  .   -No lactate checked and no blood cultures drawn in the ED.   -Diarrhea has resolved and she has not had a bowel movement 4 days for anorexic precautions have been discontinued given that C. difficile GI pathogen panel has not been collected; infectious colitis seems unlikely at this point given her lack of diarrhea but inflammatory bowel disease could be a possibility -GI has canceled her stool studies if she starts having diarrhea again they are recommending reordering -She continues to have bilateral lower abdominal pain so we will get a complete pelvic ultrasound -Her ultrasound pelvis was done and showed "Sonographic appearance of the ovaries can be seen in the setting of polycystic ovarian syndrome.  Normal ovarian blood flow.  The previous questioned endometrial polyp is not seen on the current exam. Moderate amount of free fluid in the pelvis, appearing  simple." -Per GI if GYN studies are negative and lower abdominal pain persists she may have a flex sigmoidoscopy to evaluate rectosigmoid findings on the CT scan -She is given diltiazem gel for anal fissure and likely  proctocolitis -UA done and showed hazy appearance with large leukocytes, negative nitrites, few bacteria, 0-5 RBCs per high-power field, 11-20 squamous epithelial cells, greater than 50 WBCs with urine culture showing multiple species and suggesting recollection -Blood cultures obtained on 03/10/2022 showing no growth to date at 5 days blood cultures on 03/12/2022 showed no growth to date 3 days -Wet prep done and negative -Continued ceftriaxone and metronidazole, add on doxycycline and because she is positive for chlamydia we will discontinue the ceftriaxone metronidazole continue on doxycycline and because of her perirectal abscess now with general surgery had started IV Zosyn and will continue and general surgery recommending changing to Augmentin and discharged -Continue with aggressive bowel regimen -RPR was nonreactive -IV fluid hydration with normal saline now stopped -Pain management with ibuprofen 400 mg x 1, morphine IV 1 mg every 4 hours as needed for severe pain -Continuing dicyclomine 10 mg 3 times daily before meals -WBC went from 8.9 is now 12.5 in the setting of Surgery  -Continue supportive care and antiemetics with ondansetron 4 mg every 6 hours as needed nausea vomiting -Testing for gonorrhea, chlamydia, syphilis, and HIV -HIV and RPR nonreactive as above and gonorrhea and Chlamydia testing done and she was positive for chlamydia -Monitor WBC count slowly trending down and finally normalized -GI feels that she has chlamydia proctitis versus pelvic inflammatory disease with associated inflammation and remains on doxycycline and given her current infection they are not we will plan endoscopic evaluation unless patient's symptoms do not improve and their plan is for outpatient evaluation in 4 to 6 weeks after discharge however general surgery request reevaluation while she is inpatient given her new developing perirectal abscess -MRI done below and showed a 4.4 cm left perirectal  abscess that was new and progressive with progressive lymphadenopathy and small volume of pelvic ascites   Lower back/left buttock pain that radiates down the leg secondary to perirectal abscess -In the setting of chlamydia proctocolitis however will need to rule out abscess or deeper infection could be also related to sciatica -Patient states it is worse with ambulation and she went to the vending machines and back and was in such excruciating pain that it brought her to tears -We will get an MRI of the pelvis with and without contrast -MRI of the pelvis done and showed "Progressive rectal wall thickening, suspicious for infectious/inflammatory proctocolitis. Developing 4.4  cm left perirectal abscess, new/progressive.  Associated progressive lymphadenopathy and small volume pelvic ascites." -Initiate gabapentin and muscle relaxants -We will need to have close GYN follow-up -As above   Chronic Normocytic Anemia -Hemoglobin/hematocrit went from 10.0/32.2 -> 9.3/29.5 -> 8.6/20.0 -> 8.5/28.1-> 9.1/29.9 -> 9.0/29.4 and is now 9.5/31.7 -Check anemia panel and showed an iron level of 29, U IBC of 231, TIBC 260, saturation ratios of 11%, ferritin level 91, folate level 10.5, vitamin B-12 level of 277 -Continue monitor for signs and symptoms of bleeding; no overt bleeding noted -Repeat CBC in a.m.   Anxiety and Depression -Continue home medication after pharmacy med rec and will resume her home fluoxetine   Migraines -Recently seen at the headache clinic -Initiate zonisamide 100 mg p.o. nightly dosing given to her by the headache specialist   Elevated D-dimer with chest discomfort -D-dimer was 1.93 -Given this he underwent  a CTA of the chest to evaluate for PE and this was negative and also showed a normal examination   Hypokalemia -Patient's K+ went from 3.7 -> 3.1 -> 3.8 -> 3.5 and is now 4.2 -Will Replete with po KCL 40 mEQ BID x2 again  -Checked Mag Level and was 1.9 -Continue to  Monitor and Replete as Necessary -Repeat CMP in the AM    Obesity -Complicates overall prognosis and care -Estimated body mass index is 35.88 kg/m as calculated from the following:   Height as of this encounter: 5' 8"  (1.727 m).   Weight as of this encounter: 107 kg.  -Weight Loss and Dietary Counseling given  DVT prophylaxis: SCDs Start: 03/10/22 0408    Code Status: Full Code Family Communication: Family at bedside sleeping   Disposition Plan:  Level of care: Med-Surg Status is: Inpatient Remains inpatient appropriate because: Needs general surgery and GI clearance prior to discharge   Consultants:  General surgery Gastroenterology The case was discussed with radiologist Dr. Gus Rankin  Procedures:  See Above  PROCEDURE:   INCISION & DRAINAGE SUPRALEVATOR RECTAL ABSCESS RECTAL BIOPSY ANORECTAL EXAMINATION UNDER ANESTHESIA  Antimicrobials:  Anti-infectives (From admission, onward)    Start     Dose/Rate Route Frequency Ordered Stop   03/14/22 1235  sodium chloride 0.9 % with cefoTEtan (CEFOTAN) ADS Med  Status:  Discontinued       Note to Pharmacy: Hedy Camara L: cabinet override      03/14/22 1235 03/14/22 1338   03/14/22 1015  cefoTEtan (CEFOTAN) 2 g in sodium chloride 0.9 % 100 mL IVPB  Status:  Discontinued        2 g 200 mL/hr over 30 Minutes Intravenous On call to O.R. 03/14/22 3474 03/14/22 0927   03/14/22 1000  piperacillin-tazobactam (ZOSYN) IVPB 3.375 g        3.375 g 12.5 mL/hr over 240 Minutes Intravenous Every 8 hours 03/14/22 0836 03/19/22 0959   03/12/22 2200  doxycycline (VIBRA-TABS) tablet 100 mg        100 mg Oral Every 12 hours 03/12/22 1420 03/24/22 0959   03/10/22 2300  cefTRIAXone (ROCEPHIN) 1 g in sodium chloride 0.9 % 100 mL IVPB  Status:  Discontinued        1 g 200 mL/hr over 30 Minutes Intravenous Every 24 hours 03/10/22 0409 03/10/22 1159   03/10/22 2300  cefTRIAXone (ROCEPHIN) 2 g in sodium chloride 0.9 % 100 mL IVPB  Status:   Discontinued        2 g 200 mL/hr over 30 Minutes Intravenous Every 24 hours 03/10/22 1159 03/12/22 1420   03/10/22 1100  metroNIDAZOLE (FLAGYL) IVPB 500 mg  Status:  Discontinued        500 mg 100 mL/hr over 60 Minutes Intravenous Every 12 hours 03/10/22 0409 03/12/22 1420   03/10/22 0500  doxycycline (VIBRAMYCIN) 100 mg in sodium chloride 0.9 % 250 mL IVPB  Status:  Discontinued        100 mg 125 mL/hr over 120 Minutes Intravenous Every 12 hours 03/10/22 0409 03/12/22 1420   03/09/22 2315  metroNIDAZOLE (FLAGYL) IVPB 500 mg        500 mg 100 mL/hr over 60 Minutes Intravenous  Once 03/09/22 2308 03/10/22 0720   03/09/22 2300  cefTRIAXone (ROCEPHIN) 1 g in sodium chloride 0.9 % 100 mL IVPB        1 g 200 mL/hr over 30 Minutes Intravenous  Once 03/09/22 2253 03/10/22 0029  Subjective: Seen and examined at bedside and states that she was having some pain still but not as bad but continues to have a headache that comes and goes.  States that she is sleepy today.  No nausea or vomiting.  No other concerns or complaints at this time.  Objective: Vitals:   03/15/22 0156 03/15/22 0558 03/15/22 1030 03/15/22 1407  BP: 115/70 119/64 (!) 107/56 (!) 103/52  Pulse: 61 (!) 53 (!) 56 60  Resp: 18 18    Temp: 97.7 F (36.5 C) 97.6 F (36.4 C) 97.7 F (36.5 C) 97.7 F (36.5 C)  TempSrc: Oral Oral Oral Oral  SpO2: 100% 98% 100% 100%  Weight:      Height:        Intake/Output Summary (Last 24 hours) at 03/15/2022 1417 Last data filed at 03/15/2022 1000 Gross per 24 hour  Intake 691.05 ml  Output 800 ml  Net -108.95 ml   Filed Weights   03/09/22 1733  Weight: 107 kg   Examination: Physical Exam:  Constitutional: WN/WD obese African-American female currently no acute distress appears a little somnolent Respiratory: Diminished to auscultation bilaterally with coarse breath sounds, no wheezing, rales, rhonchi or crackles. Normal respiratory effort and patient is not tachypenic. No  accessory muscle use.  Unlabored breathing Cardiovascular: RRR, no murmurs / rubs / gallops. S1 and S2 auscultated. No extremity edema. Abdomen: Soft, non-tender, distended second body habitus bowel sounds positive.  GU: Deferred. Musculoskeletal: No clubbing / cyanosis of digits/nails. No joint deformity upper and lower extremities. Skin: No rashes, lesions, ulcers on limited skin evaluation. No induration; Warm and dry.  Neurologic: CN 2-12 grossly intact with no focal deficits. Romberg sign and cerebellar reflexes not assessed.  Psychiatric: Normal judgment and insight. Alert and oriented x 3. Normal mood and appropriate affect.   Data Reviewed: I have personally reviewed following labs and imaging studies  CBC: Recent Labs  Lab 03/11/22 0035 03/12/22 0807 03/13/22 0441 03/14/22 0532 03/15/22 0755  WBC 11.7* 8.7 7.8 8.9 12.5*  NEUTROABS  --  5.8 4.6 5.1  --   HGB 8.6* 8.5* 9.1* 9.0* 9.5*  HCT 28.0* 28.1* 29.9* 29.4* 31.7*  MCV 81.4 82.6 81.0 81.4 82.6  PLT 204 253 294 324 361   Basic Metabolic Panel: Recent Labs  Lab 03/11/22 0035 03/11/22 0517 03/12/22 0521 03/13/22 0441 03/14/22 0532 03/15/22 0816  NA 137  --  139 141 140 138  K 3.1*  --  3.8 3.8 3.5 4.2  CL 107  --  111 113* 108 107  CO2 22  --  20* 24 22 24   GLUCOSE 95  --  100* 93 115* 120*  BUN 5*  --  5* <5* 5* 10  CREATININE 0.85  --  0.77 0.80 0.81 0.66  CALCIUM 7.9*  --  8.2* 8.5* 8.3* 8.7*  MG  --  1.8 1.9 2.0 1.9 2.2  PHOS  --  2.5 2.7 4.2 4.9* 4.4   GFR: Estimated Creatinine Clearance: 143.6 mL/min (by C-G formula based on SCr of 0.66 mg/dL). Liver Function Tests: Recent Labs  Lab 03/11/22 0035 03/12/22 0521 03/13/22 0441 03/14/22 0532 03/15/22 0816  AST 21 17 20 25 28   ALT 28 21 19 21 25   ALKPHOS 71 70 62 60 65  BILITOT 0.4 0.4 0.5 0.2* 0.3  PROT 7.0 6.7 6.9 6.8 7.6  ALBUMIN 2.7* 2.8* 2.8* 2.7* 2.9*   Recent Labs  Lab 03/09/22 1745  LIPASE <10*   No results for input(s): "  AMMONIA"  in the last 168 hours. Coagulation Profile: No results for input(s): "INR", "PROTIME" in the last 168 hours. Cardiac Enzymes: No results for input(s): "CKTOTAL", "CKMB", "CKMBINDEX", "TROPONINI" in the last 168 hours. BNP (last 3 results) No results for input(s): "PROBNP" in the last 8760 hours. HbA1C: No results for input(s): "HGBA1C" in the last 72 hours. CBG: No results for input(s): "GLUCAP" in the last 168 hours. Lipid Profile: No results for input(s): "CHOL", "HDL", "LDLCALC", "TRIG", "CHOLHDL", "LDLDIRECT" in the last 72 hours. Thyroid Function Tests: No results for input(s): "TSH", "T4TOTAL", "FREET4", "T3FREE", "THYROIDAB" in the last 72 hours. Anemia Panel: Recent Labs    03/14/22 0532  VITAMINB12 277  FOLATE 10.5  FERRITIN 91  TIBC 260  IRON 29  RETICCTPCT 1.0   Sepsis Labs: Recent Labs  Lab 03/10/22 1208  LATICACIDVEN 1.2    Recent Results (from the past 240 hour(s))  Resp Panel by RT-PCR (Flu A&B, Covid) Anterior Nasal Swab     Status: None   Collection Time: 03/09/22  5:45 PM   Specimen: Anterior Nasal Swab  Result Value Ref Range Status   SARS Coronavirus 2 by RT PCR NEGATIVE NEGATIVE Final    Comment: (NOTE) SARS-CoV-2 target nucleic acids are NOT DETECTED.  The SARS-CoV-2 RNA is generally detectable in upper respiratory specimens during the acute phase of infection. The lowest concentration of SARS-CoV-2 viral copies this assay can detect is 138 copies/mL. A negative result does not preclude SARS-Cov-2 infection and should not be used as the sole basis for treatment or other patient management decisions. A negative result may occur with  improper specimen collection/handling, submission of specimen other than nasopharyngeal swab, presence of viral mutation(s) within the areas targeted by this assay, and inadequate number of viral copies(<138 copies/mL). A negative result must be combined with clinical observations, patient history, and  epidemiological information. The expected result is Negative.  Fact Sheet for Patients:  EntrepreneurPulse.com.au  Fact Sheet for Healthcare Providers:  IncredibleEmployment.be  This test is no t yet approved or cleared by the [place] FDA and  has been authorized for detection and/or diagnosis of SARS-CoV-2 by FDA under an Emergency Use Authorization (EUA). This EUA will remain  in effect (meaning this test can be used) for the duration of the COVID-19 declaration under Section 564(b)(1) of the Act, 21 U.S.C.section 360bbb-3(b)(1), unless the authorization is terminated  or revoked sooner.       Influenza A by PCR NEGATIVE NEGATIVE Final   Influenza B by PCR NEGATIVE NEGATIVE Final    Comment: (NOTE) The Xpert Xpress SARS-CoV-2/FLU/RSV plus assay is intended as an aid in the diagnosis of influenza from Nasopharyngeal swab specimens and should not be used as a sole basis for treatment. Nasal washings and aspirates are unacceptable for Xpert Xpress SARS-CoV-2/FLU/RSV testing.  Fact Sheet for Patients: EntrepreneurPulse.com.au  Fact Sheet for Healthcare Providers: IncredibleEmployment.be  This test is not yet approved or cleared by the [place] FDA and has been authorized for detection and/or diagnosis of SARS-CoV-2 by FDA under an Emergency Use Authorization (EUA). This EUA will remain in effect (meaning this test can be used) for the duration of the COVID-19 declaration under Section 564(b)(1) of the Act, 21 U.S.C. section 360bbb-3(b)(1), unless the authorization is terminated or revoked.  Performed at [facility], [address], [place], [place] [address]   Urine Culture     Status: Abnormal   Collection Time: 03/09/22 10:37 PM   Specimen: Urine, Clean Catch  Result  Value Ref Range Status   Specimen Description   Final    URINE, CLEAN CATCH Performed at [facility], [address] [place], [place] [address]    Special Requests   Final    NONE Performed at [facility], [address], [place], [place] [address]    Culture MULTIPLE SPECIES PRESENT, SUGGEST RECOLLECTION (A)  Final   Report Status 03/11/2022 FINAL  Final  Wet prep, genital     Status: None   Collection Time: 03/09/22 10:37 PM   Specimen: PATH Cytology Cervicovaginal Ancillary Only  Result Value Ref Range Status   Yeast Wet Prep HPF POC NONE SEEN NONE SEEN Final   Trich, Wet Prep NONE SEEN NONE SEEN Final   Clue Cells Wet Prep HPF POC NONE SEEN NONE SEEN Final   WBC, Wet Prep HPF POC <10 <10 Final   Sperm NONE SEEN  Final    Comment: Performed at [facility], [address], [place], [place] [address]  Culture, blood (Routine X 2) w Reflex to ID Panel     Status: None   Collection Time: 03/10/22 11:55 AM   Specimen: BLOOD  Result Value Ref Range Status   Specimen Description   Final    BLOOD LEFT ANTECUBITAL Performed at [facility] [address], [place], [place] [address]    Special Requests   Final    BOTTLES DRAWN AEROBIC AND ANAEROBIC Blood Culture results may not be optimal due to an inadequate volume of blood received in culture bottles Performed at [facility] [address], [place], [place] [address]    Culture   Final    NO GROWTH 5 DAYS Performed at [facility], [place] [address] [place], [place] [address]    Report Status 03/15/2022 FINAL  Final  Culture, blood (Routine X 2) w Reflex to ID Panel     Status: None   Collection Time: 03/10/22 12:07 PM   Specimen: BLOOD  Result Value Ref Range Status   Specimen Description   Final    BLOOD LEFT ANTECUBITAL Performed at [facility] [address], [place], [place] [address]    Special Requests   Final    BOTTLES DRAWN AEROBIC AND ANAEROBIC Blood Culture results may not be optimal  due to an inadequate volume of blood received in culture bottles Performed at [facility] [address], [place], [place] [address]    Culture   Final    NO GROWTH 5 DAYS Performed at [facility], [place] [address], [place], [place] [address]    Report Status 03/15/2022 FINAL  Final  Culture, blood (Routine X 2) w Reflex to ID Panel     Status: None (Preliminary result)   Collection Time: 03/11/22  7:14 PM   Specimen: BLOOD  Result Value Ref Range Status   Specimen Description   Final    BLOOD BLOOD RIGHT ARM Performed at [facility] [address], [place], [place] [address]    Special Requests   Final    IN PEDIATRIC BOTTLE Blood Culture adequate volume Performed at [facility] [address], [place], [place] [address]    Culture   Final    NO GROWTH 4 DAYS Performed at [facility], [place] [address] [place], [place] [address]    Report Status PENDING  Incomplete  Culture, blood (Routine X 2) w Reflex to ID  Panel     Status: None (Preliminary result)   Collection Time: 03/12/22  5:21 AM   Specimen: BLOOD  Result Value Ref Range Status   Specimen Description   Final    BLOOD BLOOD RIGHT ARM Performed at [facility] [address], [place], [place] [address]    Special Requests   Final    BOTTLES DRAWN AEROBIC AND ANAEROBIC Blood Culture adequate volume Performed at [facility] [address], [place], [place] [address]    Culture   Final    NO GROWTH 3 DAYS Performed at [facility], [place] [address], [place], [place] [address]    Report Status PENDING  Incomplete  Aerobic/Anaerobic Culture w Gram Stain (surgical/deep wound)     Status: None (Preliminary result)   Collection Time: 03/14/22  2:20 PM   Specimen: PATH Other; Body Fluid  Result Value Ref Range Status   Specimen Description   Final    FLUID Performed at [facility] [address], [place], [place] [address]    Special Requests   Final    FLUID Performed at [facility], [place] [address] [place], [place] [address]    Gram Stain PENDING  Incomplete   Culture   Final    ABUNDANT ENTEROCOCCUS FAECALIS CULTURE REINCUBATED FOR BETTER GROWTH Performed at [facility], [place] [address], [place],  [address]    Report Status PENDING  Incomplete    Radiology Studies: MR PELVIS W WO CONTRAST  Result Date: 03/13/2022 CLINICAL DATA:  Anorectal abscess EXAM: MRI PELVIS WITHOUT AND WITH CONTRAST TECHNIQUE: Multiplanar multisequence MR imaging of the pelvis was performed both before and after administration of intravenous contrast. CONTRAST:  29m GADAVIST GADOBUTROL 1 MMOL/ML IV SOLN COMPARISON:  CT abdomen/pelvis dated 03/09/2022 FINDINGS: Urinary Tract:  Bladder is within normal limits. Bowel: Sigmoid colon is decompressed. Rectal wall thickening with pericolonic inflammatory changes. This appearance is suspicious for infectious/inflammatory proctocolitis. Vascular/Lymphatic: No evidence of aneurysm. Progressive presacral and perirectal lymphadenopathy measuring up to 10 mm short axis (series 7/image 10), likely inflammatory. Reproductive: Uterus and bilateral ovaries are within normal limits. Other: Small volume pelvic ascites, progressive. Presacral fluid with inflammatory change. Interval development of a 2.8 x 3.7 x 4.4 cm complex fluid collection in the left perirectal space (series 7/image 20), progressive and more organized when compared to recent CT, compatible with developing abscess. Musculoskeletal: No focal osseous lesions. IMPRESSION: Progressive rectal wall thickening, suspicious for infectious/inflammatory proctocolitis. Developing 4.4 cm left perirectal abscess, new/progressive. Associated progressive lymphadenopathy and small volume pelvic ascites. Electronically Signed   By: SJulian HyM.D.   On: 03/13/2022 18:24      Scheduled Meds:  acetaminophen  1,000 mg Oral Q6H   dicyclomine  10 mg Oral TID AC   diltiazem   Topical TID   doxycycline  100 mg Oral Q12H   FLUoxetine  40 mg Oral Daily   gabapentin  300 mg Oral BID   lip balm   Topical BID   loratadine  10 mg Oral Daily   polyethylene glycol  34 g Oral BID   sodium chloride flush  3 mL Intravenous Q12H   zonisamide  100 mg Oral QHS   Continuous Infusions:  sodium chloride     lactated ringers     methocarbamol (ROBAXIN) IV     ondansetron (ZOFRAN) IV     piperacillin-tazobactam (ZOSYN)  IV 3.375 g (03/15/22 0810)  LOS: 5 days   Raiford Noble, DO [facility] Available via Epic secure chat 7am-7pm After these hours, please refer to coverage provider listed on [email or web address] 03/15/2022, 2:17 PM

## 2022-03-16 ENCOUNTER — Encounter (HOSPITAL_COMMUNITY): Payer: Self-pay | Admitting: Surgery

## 2022-03-16 DIAGNOSIS — D649 Anemia, unspecified: Secondary | ICD-10-CM | POA: Diagnosis not present

## 2022-03-16 DIAGNOSIS — K5909 Other constipation: Secondary | ICD-10-CM

## 2022-03-16 DIAGNOSIS — K529 Noninfective gastroenteritis and colitis, unspecified: Secondary | ICD-10-CM | POA: Diagnosis not present

## 2022-03-16 DIAGNOSIS — R933 Abnormal findings on diagnostic imaging of other parts of digestive tract: Secondary | ICD-10-CM | POA: Diagnosis not present

## 2022-03-16 DIAGNOSIS — K602 Anal fissure, unspecified: Secondary | ICD-10-CM | POA: Diagnosis not present

## 2022-03-16 LAB — CBC WITH DIFFERENTIAL/PLATELET
Abs Immature Granulocytes: 0.33 10*3/uL — ABNORMAL HIGH (ref 0.00–0.07)
Basophils Absolute: 0 10*3/uL (ref 0.0–0.1)
Basophils Relative: 0 %
Eosinophils Absolute: 0.1 10*3/uL (ref 0.0–0.5)
Eosinophils Relative: 1 %
HCT: 31.7 % — ABNORMAL LOW (ref 36.0–46.0)
Hemoglobin: 9.4 g/dL — ABNORMAL LOW (ref 12.0–15.0)
Immature Granulocytes: 3 %
Lymphocytes Relative: 41 %
Lymphs Abs: 4 10*3/uL (ref 0.7–4.0)
MCH: 24.5 pg — ABNORMAL LOW (ref 26.0–34.0)
MCHC: 29.7 g/dL — ABNORMAL LOW (ref 30.0–36.0)
MCV: 82.8 fL (ref 80.0–100.0)
Monocytes Absolute: 0.4 10*3/uL (ref 0.1–1.0)
Monocytes Relative: 5 %
Neutro Abs: 4.9 10*3/uL (ref 1.7–7.7)
Neutrophils Relative %: 50 %
Platelets: 329 10*3/uL (ref 150–400)
RBC: 3.83 MIL/uL — ABNORMAL LOW (ref 3.87–5.11)
RDW: 16 % — ABNORMAL HIGH (ref 11.5–15.5)
WBC: 9.8 10*3/uL (ref 4.0–10.5)
nRBC: 0 % (ref 0.0–0.2)

## 2022-03-16 LAB — COMPREHENSIVE METABOLIC PANEL
ALT: 25 U/L (ref 0–44)
AST: 25 U/L (ref 15–41)
Albumin: 2.9 g/dL — ABNORMAL LOW (ref 3.5–5.0)
Alkaline Phosphatase: 57 U/L (ref 38–126)
Anion gap: 6 (ref 5–15)
BUN: 12 mg/dL (ref 6–20)
CO2: 25 mmol/L (ref 22–32)
Calcium: 8.5 mg/dL — ABNORMAL LOW (ref 8.9–10.3)
Chloride: 111 mmol/L (ref 98–111)
Creatinine, Ser: 0.69 mg/dL (ref 0.44–1.00)
GFR, Estimated: >60 mL/min (ref >60)
Glucose, Bld: 105 mg/dL — ABNORMAL HIGH (ref 70–99)
Potassium: 4 mmol/L (ref 3.5–5.1)
Sodium: 142 mmol/L (ref 135–145)
Total Bilirubin: 0.2 mg/dL — ABNORMAL LOW (ref 0.3–1.2)
Total Protein: 7.2 g/dL (ref 6.5–8.1)

## 2022-03-16 LAB — CULTURE, BLOOD (ROUTINE X 2)
Culture: NO GROWTH
Special Requests: ADEQUATE

## 2022-03-16 LAB — MAGNESIUM: Magnesium: 2.2 mg/dL (ref 1.7–2.4)

## 2022-03-16 LAB — PHOSPHORUS: Phosphorus: 3.9 mg/dL (ref 2.5–4.6)

## 2022-03-16 MED ORDER — DICYCLOMINE HCL 10 MG PO CAPS: 10.0000 mg | ORAL_CAPSULE | Freq: Three times a day (TID) | ORAL | 0 refills | Status: DC

## 2022-03-16 MED ORDER — DOXYCYCLINE HYCLATE 100 MG PO TABS: 100.0000 mg | ORAL_TABLET | Freq: Two times a day (BID) | ORAL | 0 refills | Status: AC

## 2022-03-16 MED ORDER — POLYETHYLENE GLYCOL 3350 17 G PO PACK: 17.0000 g | PACK | Freq: Two times a day (BID) | ORAL | 0 refills | Status: DC | PRN

## 2022-03-16 MED ORDER — ACETAMINOPHEN 325 MG PO TABS: 650.0000 mg | ORAL_TABLET | Freq: Four times a day (QID) | ORAL | 0 refills | Status: DC | PRN

## 2022-03-16 MED ORDER — OXYCODONE HCL 5 MG PO TABS: 5.0000 mg | ORAL_TABLET | Freq: Four times a day (QID) | ORAL | 0 refills | Status: DC | PRN

## 2022-03-16 MED ORDER — MAGIC MOUTHWASH
10.0000 mL | Freq: Three times a day (TID) | ORAL | Status: DC
  Administered 2022-03-16: 10 mL via ORAL
  Filled 2022-03-16 (×2): qty 10

## 2022-03-16 MED ORDER — POLYETHYLENE GLYCOL 3350 17 G PO PACK: 17.0000 g | PACK | Freq: Two times a day (BID) | ORAL | 0 refills | Status: AC

## 2022-03-16 MED ORDER — AMOXICILLIN-POT CLAVULANATE 875-125 MG PO TABS: 1.0000 | ORAL_TABLET | Freq: Two times a day (BID) | ORAL | 0 refills | Status: AC

## 2022-03-16 NOTE — Anesthesia Postprocedure Evaluation (Signed)
Anesthesia Post Note  Patient: Jacqueline Gonzalez  Procedure(s) Performed: IRRIGATION AND DEBRIDEMENT PERIRECTAL ABSCESS (Rectum)     Patient location during evaluation: PACU Anesthesia Type: General Level of consciousness: awake and alert Pain management: pain level controlled Vital Signs Assessment: post-procedure vital signs reviewed and stable Respiratory status: spontaneous breathing, nonlabored ventilation, respiratory function stable and patient connected to nasal cannula oxygen Cardiovascular status: blood pressure returned to baseline and stable Postop Assessment: no apparent nausea or vomiting Anesthetic complications: no   No notable events documented.  Last Vitals:  Vitals:   03/15/22 2023 03/16/22 0510  BP: (!) 108/44 (!) 105/56  Pulse: 77 (!) 56  Resp: 18 18  Temp: 36.4 C (!) 36.3 C  SpO2: 100% 100%    Last Pain:  Vitals:   03/16/22 0510  TempSrc: Oral  PainSc:                  [place]

## 2022-03-16 NOTE — Progress Notes (Signed)
Discharge instructions given to patient and all questions were answered.  

## 2022-03-16 NOTE — Progress Notes (Signed)
Progress Note  2 Days Post-Op  Subjective: Abdominal, buttock, and leg pain all continue to improve. Some nausea after eating but no emesis. Having diarrhea   Objective: Vital signs in last 24 hours: Temp:  [97.4 F (36.3 C)-97.7 F (36.5 C)] 97.4 F (36.3 C) (08/28 0510) Pulse Rate:  [56-77] 56 (08/28 0510) Resp:  [18] 18 (08/28 0510) BP: (103-108)/(44-56) 105/56 (08/28 0510) SpO2:  [100 %] 100 % (08/28 0510) Last BM Date : 03/14/22  Intake/Output from previous day: 08/27 0701 - 08/28 0700 In: 1344.6 [P.O.:1200; IV Piggyback:144.6] Out: 1000 [Urine:1000] Intake/Output this shift: No intake/output data recorded.  PE: General: pleasant, WD, female who is laying in bed in NAD Heart: Palpable radial pulses bilaterally Lungs:  Respiratory effort nonlabored Abd: soft, NT, ND MSK: all 4 extremities are symmetrical with no cyanosis, clubbing, or edema. GU: deferred Skin: warm and dry Psych: A&Ox3 with an appropriate affect.    Lab Results:  Recent Labs    03/15/22 0755 03/16/22 0418  WBC 12.5* 9.8  HGB 9.5* 9.4*  HCT 31.7* 31.7*  PLT 354 329   BMET Recent Labs    03/15/22 0816 03/16/22 0418  NA 138 142  K 4.2 4.0  CL 107 111  CO2 24 25  GLUCOSE 120* 105*  BUN 10 12  CREATININE 0.66 0.69  CALCIUM 8.7* 8.5*   PT/INR No results for input(s): "LABPROT", "INR" in the last 72 hours. CMP     Component Value Date/Time   NA 142 03/16/2022 0418   NA 138 08/28/2020 1600   K 4.0 03/16/2022 0418   CL 111 03/16/2022 0418   CO2 25 03/16/2022 0418   GLUCOSE 105 (H) 03/16/2022 0418   BUN 12 03/16/2022 0418   BUN 6 08/28/2020 1600   CREATININE 0.69 03/16/2022 0418   CALCIUM 8.5 (L) 03/16/2022 0418   PROT 7.2 03/16/2022 0418   PROT 7.6 08/28/2020 1600   ALBUMIN 2.9 (L) 03/16/2022 0418   ALBUMIN 4.3 08/28/2020 1600   AST 25 03/16/2022 0418   ALT 25 03/16/2022 0418   ALKPHOS 57 03/16/2022 0418   BILITOT 0.2 (L) 03/16/2022 0418   BILITOT 0.2 08/28/2020 1600    GFRNONAA >60 03/16/2022 0418   GFRAA 147 08/28/2020 1600   Lipase     Component Value Date/Time   LIPASE <10 (L) 03/09/2022 1745       Studies/Results: No results found.  Anti-infectives: Anti-infectives (From admission, onward)    Start     Dose/Rate Route Frequency Ordered Stop   03/14/22 1235  sodium chloride 0.9 % with cefoTEtan (CEFOTAN) ADS Med  Status:  Discontinued       Note to Pharmacy: Hedy Camara L: cabinet override      03/14/22 1235 03/14/22 1338   03/14/22 1015  cefoTEtan (CEFOTAN) 2 g in sodium chloride 0.9 % 100 mL IVPB  Status:  Discontinued        2 g 200 mL/hr over 30 Minutes Intravenous On call to O.R. 03/14/22 8850 03/14/22 0927   03/14/22 1000  piperacillin-tazobactam (ZOSYN) IVPB 3.375 g        3.375 g 12.5 mL/hr over 240 Minutes Intravenous Every 8 hours 03/14/22 0836 03/19/22 0959   03/12/22 2200  doxycycline (VIBRA-TABS) tablet 100 mg        100 mg Oral Every 12 hours 03/12/22 1420 03/24/22 0959   03/10/22 2300  cefTRIAXone (ROCEPHIN) 1 g in sodium chloride 0.9 % 100 mL IVPB  Status:  Discontinued  1 g 200 mL/hr over 30 Minutes Intravenous Every 24 hours 03/10/22 0409 03/10/22 1159   03/10/22 2300  cefTRIAXone (ROCEPHIN) 2 g in sodium chloride 0.9 % 100 mL IVPB  Status:  Discontinued        2 g 200 mL/hr over 30 Minutes Intravenous Every 24 hours 03/10/22 1159 03/12/22 1420   03/10/22 1100  metroNIDAZOLE (FLAGYL) IVPB 500 mg  Status:  Discontinued        500 mg 100 mL/hr over 60 Minutes Intravenous Every 12 hours 03/10/22 0409 03/12/22 1420   03/10/22 0500  doxycycline (VIBRAMYCIN) 100 mg in sodium chloride 0.9 % 250 mL IVPB  Status:  Discontinued        100 mg 125 mL/hr over 120 Minutes Intravenous Every 12 hours 03/10/22 0409 03/12/22 1420   03/09/22 2315  metroNIDAZOLE (FLAGYL) IVPB 500 mg        500 mg 100 mL/hr over 60 Minutes Intravenous  Once 03/09/22 2308 03/10/22 0720   03/09/22 2300  cefTRIAXone (ROCEPHIN) 1 g in sodium  chloride 0.9 % 100 mL IVPB        1 g 200 mL/hr over 30 Minutes Intravenous  Once 03/09/22 2253 03/10/22 0029        Assessment/Plan  Supralevator rectal abscess POD 2 s/p OR I&D, rectal biopsy. Dr Johney Maine 8/26 - path pending - cont abx 5 days postop. Can transition to augmentin at dc - cont aggressive bowel regimen   FEN: soft ID: rocephin/flagyl > zosyn, doxycycline (chlamydia infxn) VTE: scds  Dispo: stable for dc from surgical perspective. Continue abx 5 days postop. Follow up pathology   LOS: 6 days   Winferd Humphrey, [facility] 03/16/2022, 7:45 AM Please see Amion for pager number during day hours 7:00am-4:30pm

## 2022-03-16 NOTE — Progress Notes (Addendum)
Daily Progress Note  Hospital Day: 8  Chief Complaint: abdominal pain, chronic constipation, recent bowel changes, proctitis  Brief History Jacqueline Gonzalez is a 21 y.o. female with chronic constipation. Admitted with recent bloody, mucoid stools (after starting Linzess), and sepsis secondary to ? UTI vs PID vs proctocolitis ( seen on CT scan).  Tested positive for Chlamydia.  Had persistent abdominal pain . MRI pelvis showed enlarging 4.4 cm left perirectal abscess with progressive rectal wall thickening , progressive lymphadenopathy and small volume pelvic ascites.  Assessment / Plan   # 20 yo with sepsis with proctocolitis and 4.4 cm left perirectal abscess. She is s/p I+D of supralevator rectal abscess on 8/26. There was moderate proctitis. No mass or fistula.  Rectal wall path pending. Fluid studies pending She has minor lower abdominal / rectal pain.   # Chronic constipation Having up to four loose stools a day on BID Miralax. Will continue for now. Need to avoid constipation  # Sore throat, post op. May be related to general anesthesia /  intubation but also describes odynophagia. Thin white film on tongue, doesn't look typical for candida Empirically treat with Nystatin S+S  # Chlamydia  Subjective   "A little" lower abdominal pain and rectal pain. Having about 4 loose stools a week. Doesn't want to eat because of throat pain  Objective  Imaging:  MR PELVIS W WO CONTRAST  Result Date: 03/13/2022 CLINICAL DATA:  Anorectal abscess EXAM: MRI PELVIS WITHOUT AND WITH CONTRAST TECHNIQUE: Multiplanar multisequence MR imaging of the pelvis was performed both before and after administration of intravenous contrast. CONTRAST:  75m GADAVIST GADOBUTROL 1 MMOL/ML IV SOLN COMPARISON:  CT abdomen/pelvis dated 03/09/2022 FINDINGS: Urinary Tract:  Bladder is within normal limits. Bowel: Sigmoid colon is decompressed. Rectal wall thickening with pericolonic inflammatory changes. This  appearance is suspicious for infectious/inflammatory proctocolitis. Vascular/Lymphatic: No evidence of aneurysm. Progressive presacral and perirectal lymphadenopathy measuring up to 10 mm short axis (series 7/image 10), likely inflammatory. Reproductive: Uterus and bilateral ovaries are within normal limits. Other: Small volume pelvic ascites, progressive. Presacral fluid with inflammatory change. Interval development of a 2.8 x 3.7 x 4.4 cm complex fluid collection in the left perirectal space (series 7/image 20), progressive and more organized when compared to recent CT, compatible with developing abscess. Musculoskeletal: No focal osseous lesions. IMPRESSION: Progressive rectal wall thickening, suspicious for infectious/inflammatory proctocolitis. Developing 4.4 cm left perirectal abscess, new/progressive. Associated progressive lymphadenopathy and small volume pelvic ascites. Electronically Signed   By: SJulian HyM.D.   On: 03/13/2022 18:24    Lab Results: Recent Labs    03/14/22 0532 03/15/22 0755 03/16/22 0418  WBC 8.9 12.5* 9.8  HGB 9.0* 9.5* 9.4*  HCT 29.4* 31.7* 31.7*  PLT 324 354 329   BMET Recent Labs    03/14/22 0532 03/15/22 0816 03/16/22 0418  NA 140 138 142  K 3.5 4.2 4.0  CL 108 107 111  CO2 22 24 25   GLUCOSE 115* 120* 105*  BUN 5* 10 12  CREATININE 0.81 0.66 0.69  CALCIUM 8.3* 8.7* 8.5*   LFT Recent Labs    03/16/22 0418  PROT 7.2  ALBUMIN 2.9*  AST 25  ALT 25  ALKPHOS 57  BILITOT 0.2*   PT/INR No results for input(s): "LABPROT", "INR" in the last 72 hours.   Scheduled inpatient medications:   acetaminophen  1,000 mg Oral Q6H   dicyclomine  10 mg Oral TID AC   diltiazem   Topical TID  doxycycline  100 mg Oral Q12H   FLUoxetine  40 mg Oral Daily   gabapentin  300 mg Oral BID   lip balm   Topical BID   loratadine  10 mg Oral Daily   polyethylene glycol  34 g Oral BID   sodium chloride flush  3 mL Intravenous Q12H   zonisamide  100 mg Oral  QHS   Continuous inpatient infusions:   sodium chloride     lactated ringers     methocarbamol (ROBAXIN) IV     ondansetron (ZOFRAN) IV     piperacillin-tazobactam (ZOSYN)  IV 3.375 g (03/16/22 0239)   PRN inpatient medications: sodium chloride, acetaminophen, alum & mag hydroxide-simeth, aspirin-acetaminophen-caffeine, diphenhydrAMINE, lactated ringers, magic mouthwash, menthol-cetylpyridinium, methocarbamol (ROBAXIN) IV, methocarbamol, morphine injection, ondansetron (ZOFRAN) IV **OR** ondansetron (ZOFRAN) IV, ondansetron (ZOFRAN) IV, oxyCODONE, phenol, polyethylene glycol, prochlorperazine, sodium chloride flush  Vital signs in last 24 hours: Temp:  [97.4 F (36.3 C)-97.7 F (36.5 C)] 97.4 F (36.3 C) (08/28 0510) Pulse Rate:  [56-77] 56 (08/28 0510) Resp:  [18] 18 (08/28 0510) BP: (103-108)/(44-56) 105/56 (08/28 0510) SpO2:  [100 %] 100 % (08/28 0510) Last BM Date : 03/14/22  Intake/Output Summary (Last 24 hours) at 03/16/2022 0850 Last data filed at 03/16/2022 0600 Gross per 24 hour  Intake 1344.64 ml  Output 1000 ml  Net 344.64 ml    Intake/Output from previous day: 08/27 0701 - 08/28 0700 In: 1344.6 [P.O.:1200; IV Piggyback:144.6] Out: 1000 [Urine:1000] Intake/Output this shift: No intake/output data recorded.   Physical Exam:  General: Alert female in NAD Heart:  Regular rate and rhythm. No lower extremity edema Pulmonary: Normal respiratory effort Abdomen: Soft, nondistended, nontender. Normal bowel sounds.  Neurologic: Alert and oriented Psych: Pleasant. Cooperative.    Principal Problem:   Proctocolitis Active Problems:   Constipation, chronic   Generalized anxiety disorder   Chronic anemia   UTI (urinary tract infection)   Rectal bleeding   Anal fissure   Abnormal CT scan, colon   Rectal pain   Rectal abscess supralevator s/p I&D 03/14/2022     LOS: 6 days   Tye Savoy ,NP 03/16/2022, 8:50 AM   Attending physician's note   I have taken  history, reviewed the chart and examined the patient. I performed a substantive portion of this encounter, including complete performance of at least one of the key components, in conjunction with the APP. I agree with the Advanced Practitioner's note, impression and recommendations.   All dressed up and ready to go home. OK to D/C per Sx Feels much better Do recommend [facility] GI as outpt if any problems D/W pt.  Will sign off for now   Carmell Austria, MD [facility] [phone]

## 2022-03-17 LAB — CULTURE, BLOOD (ROUTINE X 2)
Culture: NO GROWTH
Special Requests: ADEQUATE

## 2022-03-17 LAB — SURGICAL PATHOLOGY

## 2022-03-17 NOTE — Discharge Summary (Signed)
Physician Discharge Summary   Patient: Jacqueline Gonzalez MRN: [id number] DOB: October 20, 2000  Admit date:     03/09/2022  Discharge date: 03/16/2022  Discharge Physician: Raiford Noble, DO   PCP: Donney Dice, DO   Recommendations at discharge:   Follow up with PCP within 1-2 weeks and repeat CBC, CMP, Mag, Phos Follow up with General Surgery within 1-2 weeks Follow GI in the outpatient setting within 1-2 weeks Follow up with OB-GYN as an outpatient within 1-2 weeks   Discharge Diagnoses: Principal Problem:   Proctocolitis Active Problems:   Constipation, chronic   Generalized anxiety disorder   Chronic anemia   UTI (urinary tract infection)   Rectal bleeding   Anal fissure   Abnormal CT scan, colon   Rectal pain   Rectal abscess supralevator s/p I&D 03/14/2022  Resolved Problems:   * No resolved hospital problems. [facility] Course: No notes on file  Assessment and Plan:  Sepsis secondary to chlamydia PID and chlamydia proctocolitis associated with Perirectal abscess -Febrile and tachycardic on arrival to the ED.  .   -No lactate checked and no blood cultures drawn in the ED.   -Diarrhea has resolved and she has not had a bowel movement 4 days for anorexic precautions have been discontinued given that C. difficile GI pathogen panel has not been collected; infectious colitis seems unlikely at this point given her lack of diarrhea but inflammatory bowel disease could be a possibility -GI has canceled her stool studies if she starts having diarrhea again they are recommending reordering -She continues to have bilateral lower abdominal pain so we will get a complete pelvic ultrasound -Her ultrasound pelvis was done and showed "Sonographic appearance of the ovaries can be seen in the setting of polycystic ovarian syndrome.  Normal ovarian blood flow.  The previous questioned endometrial polyp is not seen on the current exam. Moderate amount of free fluid in the pelvis, appearing  simple." -Per GI if GYN studies are negative and lower abdominal pain persists she may have a flex sigmoidoscopy to evaluate rectosigmoid findings on the CT scan -She is given diltiazem gel for anal fissure and likely proctocolitis -UA done and showed hazy appearance with large leukocytes, negative nitrites, few bacteria, 0-5 RBCs per high-power field, 11-20 squamous epithelial cells, greater than 50 WBCs with urine culture showing multiple species and suggesting recollection -Blood cultures obtained on 03/10/2022 showing no growth to date at 5 days blood cultures on 03/12/2022 showed no growth to date 3 days -Wet prep done and negative -Continued ceftriaxone and metronidazole, add on doxycycline and because she is positive for chlamydia we will discontinue the ceftriaxone metronidazole continue on doxycycline and because of her perirectal abscess now with general surgery had started IV Zosyn and will continue and general surgery recommending changing to Augmentin and discharged -Continue with aggressive bowel regimen -RPR was nonreactive -IV fluid hydration with normal saline now stopped -Pain management with ibuprofen 400 mg x 1, morphine IV 1 mg every 4 hours as needed for severe pain -Continuing dicyclomine 10 mg 3 times daily before meals -WBC went from 8.9 is now 12.5 in the setting of Surgery and is improved is 9.8 -Continue supportive care and antiemetics with ondansetron 4 mg every 6 hours as needed nausea vomiting -Testing for gonorrhea, chlamydia, syphilis, and HIV -HIV and RPR nonreactive as above and gonorrhea and Chlamydia testing done and she was positive for chlamydia -Monitor WBC count slowly trending down and finally normalized -GI feels that she has chlamydia  proctitis versus pelvic inflammatory disease with associated inflammation and remains on doxycycline and given her current infection they are not we will plan endoscopic evaluation unless patient's symptoms do not improve and  their plan is for outpatient evaluation in 4 to 6 weeks after discharge however general surgery request reevaluation while she is inpatient given her new developing perirectal abscess -MRI done below and showed a 4.4 cm left perirectal abscess that was new and progressive with progressive lymphadenopathy and small volume of pelvic ascites -General Surgery took the patient for intervention for Abscess -Abscess grew out Enterococcus Faecalis  -Placed on IV Zosyn and changed to po Augmentin  -Follow up with General Surgery and Gyn in the outpatient setting    Lower back/left buttock pain that radiates down the leg secondary to perirectal abscess -In the setting of chlamydia proctocolitis however will need to rule out abscess or deeper infection could be also related to sciatica -Patient states it is worse with ambulation and she went to the vending machines and back and was in such excruciating pain that it brought her to tears -We will get an MRI of the pelvis with and without contrast -MRI of the pelvis done and showed "Progressive rectal wall thickening, suspicious for infectious/inflammatory proctocolitis. Developing 4.4  cm left perirectal abscess, new/progressive.  Associated progressive lymphadenopathy and small volume pelvic ascites." -Initiate gabapentin and muscle relaxants -We will need to have close GYN follow-up -As above   Chronic Normocytic Anemia -Hemoglobin/hematocrit went from 10.0/32.2 -> 9.3/29.5 -> 8.6/20.0 -> 8.5/28.1-> 9.1/29.9 -> 9.0/29.4 and is now 9.5/31.7 -> 9.4/31.7  -Check anemia panel and showed an iron level of 29, U IBC of 231, TIBC 260, saturation ratios of 11%, ferritin level 91, folate level 10.5, vitamin B-12 level of 277 -Continue monitor for signs and symptoms of bleeding; no overt bleeding noted -Repeat CBC within 1 week    Anxiety and Depression -Continue home medication after pharmacy med rec and will resume her home fluoxetine   Migraines -Recently  seen at the headache clinic -Initiate zonisamide 100 mg p.o. nightly dosing given to her by the headache specialist   Elevated D-dimer with chest discomfort -D-dimer was 1.93 -Given this he underwent a CTA of the chest to evaluate for PE and this was negative and also showed a normal examination   Hypokalemia -Patient's K+ went from 3.7 -> 3.1 -> 3.8 -> 3.5 and is now 4.2 -> 4.0 -Checked Mag Level and was 1.9 -Continue to Monitor and Replete as Necessary -Repeat CMP in the AM    Obesity -Complicates overall prognosis and care -Estimated body mass index is 35.88 kg/m as calculated from the following:   Height as of this encounter: 5' 8"  (1.727 m).   Weight as of this encounter: 107 kg.  -Weight Loss and Dietary Counseling given  Consultants: General Surgery, GI Procedures performed: See Above; Had I and D  Disposition: Home Diet recommendation:  Discharge Diet Orders (From admission, onward)     Start     Ordered   03/16/22 0000  Diet - low sodium heart healthy        03/16/22 1254   03/16/22 0000  Diet general        03/16/22 1254           Cardiac diet DISCHARGE MEDICATION: Allergies as of 03/16/2022       Reactions   Blue Dyes (parenteral) Anaphylaxis   Blueberry [vaccinium Angustifolium] Anaphylaxis   Raspberry Anaphylaxis   Shellfish Allergy Anaphylaxis  ALL SHELLFISH   Amitriptyline Other (See Comments)   Fast heart rate and throat closing        Medication List     STOP taking these medications    cyclobenzaprine 10 MG tablet Commonly known as: FLEXERIL   doxycycline 100 MG capsule Commonly known as: VIBRAMYCIN Replaced by: doxycycline 100 MG tablet   fluconazole 150 MG tablet Commonly known as: DIFLUCAN   ibuprofen 600 MG tablet Commonly known as: ADVIL   Midol Complete 500-60-15 MG Tabs Generic drug: Acetaminophen-Caff-Pyrilamine   naproxen 500 MG tablet Commonly known as: NAPROSYN   polyethylene glycol powder 17 GM/SCOOP  powder Commonly known as: GLYCOLAX/MIRALAX Replaced by: polyethylene glycol 17 g packet   tranexamic acid 650 MG Tabs tablet Commonly known as: LYSTEDA       TAKE these medications    acetaminophen 325 MG tablet Commonly known as: TYLENOL Take 2 tablets (650 mg total) by mouth every 6 (six) hours as needed for fever or mild pain. What changed:  medication strength how much to take reasons to take this   [place] Medication Name: Diltiazem 2% gel -Using your index finger, apply a small amount of medication inside the rectum up to your first knuckle/joint three times daily x 6-8 weeks.   amoxicillin-clavulanate 875-125 MG tablet Commonly known as: AUGMENTIN Take 1 tablet by mouth 2 (two) times daily for 4 days.   baclofen 10 MG tablet Commonly known as: LIORESAL Take 10 mg by mouth.   cetirizine 10 MG tablet Commonly known as: ZYRTEC Take 10 mg by mouth daily as needed for allergies.   COVID-19 At-Home Test Kit Take as directed on package instructions   dicyclomine 10 MG capsule Commonly known as: BENTYL Take 1 capsule (10 mg total) by mouth 3 (three) times daily before meals.   doxycycline 100 MG tablet Commonly known as: VIBRA-TABS Take 1 tablet (100 mg total) by mouth every 12 (twelve) hours for 15 doses. Replaces: doxycycline 100 MG capsule   Excedrin Migraine 250-250-65 MG tablet Generic drug: aspirin-acetaminophen-caffeine Take 2 tablets by mouth every 6 (six) hours as needed for headache or migraine.   FLUoxetine 40 MG capsule Commonly known as: PROZAC Take 1 capsule (40 mg total) by mouth daily. What changed: when to take this   fluticasone 50 MCG/ACT nasal spray Commonly known as: FLONASE Place 2 sprays into both nostrils daily. What changed:  when to take this reasons to take this   hyoscyamine 0.125 MG SL tablet Commonly known as: LEVSIN SL Place 1 tablet (0.125 mg total) under the tongue every 4 (four) hours as  needed.   lubiprostone 8 MCG capsule Commonly known as: Amitiza Take 1 capsule (8 mcg total) by mouth 2 (two) times daily with a meal.   METAMUCIL PO Take 1 packet by mouth daily as needed (constipation).   ondansetron 4 MG tablet Commonly known as: Zofran Take 1 tablet (4 mg total) by mouth every 8 (eight) hours as needed for nausea or vomiting.   oxyCODONE 5 MG immediate release tablet Commonly known as: Oxy IR/ROXICODONE Take 1 tablet (5 mg total) by mouth every 6 (six) hours as needed for moderate pain, severe pain or breakthrough pain.   polyethylene glycol 17 g packet Commonly known as: MIRALAX / GLYCOLAX Take 17 g by mouth 2 (two) times daily for 14 days. Replaces: polyethylene glycol powder 17 GM/SCOOP powder   polyethylene glycol 17 g packet Commonly known as: MIRALAX / GLYCOLAX Take 17 g by mouth  every 12 (twelve) hours as needed for mild constipation, moderate constipation or severe constipation.   triamcinolone cream 0.1 % Commonly known as: KENALOG Apply 1 Application topically 2 (two) times daily as needed (eczema).   zonisamide 25 MG capsule Commonly known as: ZONEGRAN Take 50 mg by mouth 2 (two) times daily.               Discharge Care Instructions  (From admission, onward)           Start     Ordered   03/16/22 0000  Discharge wound care:       Comments: Per General Surgery   03/16/22 1254            Follow-up Information     Maczis, Carlena Hurl, PA-C. Go on 04/07/2022.   Specialty: General Surgery Why: follow up on 04/07/22 at 1:30 pm. Please arrive 30 minutes early to complete check in process and bring photo ID and insurance card if you have them Contact information: [place] [address] [phone]                Discharge Exam: Danley Danker Weights   03/09/22 1733  Weight: 107 kg   Vitals:   03/15/22 2023 03/16/22 0510  BP: (!) 108/44 (!) 105/56  Pulse: 77 (!) 56  Resp: 18 18  Temp: 97.6 F  (36.4 C) (!) 97.4 F (36.3 C)  SpO2: 100% 100%   Examination: Physical Exam:  Constitutional: WN/WD obese AAF in NAD and appears calm and comfortable Respiratory: Diminished to auscultation bilaterally with coarse breath sounds, no wheezing, rales, rhonchi or crackles. Normal respiratory effort and patient is not tachypenic. No accessory muscle use. Unlabored breathing  Cardiovascular: RRR, no murmurs / rubs / gallops. S1 and S2 auscultated. No extremity edema Abdomen: Soft, non-tender, Distended 2/2 body habitus. Bowel sounds positive.  GU: Deferred. Musculoskeletal: No clubbing / cyanosis of digits/nails. No joint deformity upper and lower extremities.  Skin: No rashes, lesions, ulcers on a limited skin evaluation. No induration; Warm and dry.  Neurologic: CN 2-12 grossly intact with no focal deficits. Romberg sign and cerebellar reflexes not assessed.  Psychiatric: Normal judgment and insight. Alert and oriented x 3. Normal mood and appropriate affect.   Condition at discharge: good  The results of significant diagnostics from this hospitalization (including imaging, microbiology, ancillary and laboratory) are listed below for reference.   Imaging Studies: MR PELVIS W WO CONTRAST  Result Date: 03/13/2022 CLINICAL DATA:  Anorectal abscess EXAM: MRI PELVIS WITHOUT AND WITH CONTRAST TECHNIQUE: Multiplanar multisequence MR imaging of the pelvis was performed both before and after administration of intravenous contrast. CONTRAST:  57m GADAVIST GADOBUTROL 1 MMOL/ML IV SOLN COMPARISON:  CT abdomen/pelvis dated 03/09/2022 FINDINGS: Urinary Tract:  Bladder is within normal limits. Bowel: Sigmoid colon is decompressed. Rectal wall thickening with pericolonic inflammatory changes. This appearance is suspicious for infectious/inflammatory proctocolitis. Vascular/Lymphatic: No evidence of aneurysm. Progressive presacral and perirectal lymphadenopathy measuring up to 10 mm short axis (series 7/image  10), likely inflammatory. Reproductive: Uterus and bilateral ovaries are within normal limits. Other: Small volume pelvic ascites, progressive. Presacral fluid with inflammatory change. Interval development of a 2.8 x 3.7 x 4.4 cm complex fluid collection in the left perirectal space (series 7/image 20), progressive and more organized when compared to recent CT, compatible with developing abscess. Musculoskeletal: No focal osseous lesions. IMPRESSION: Progressive rectal wall thickening, suspicious for infectious/inflammatory proctocolitis. Developing 4.4 cm left perirectal abscess, new/progressive. Associated progressive  lymphadenopathy and small volume pelvic ascites. Electronically Signed   By: Julian Hy M.D.   On: 03/13/2022 18:24   US PELVIC COMPLETE W TRANSVAGINAL AND TORSION R/O  Result Date: 03/11/2022 CLINICAL DATA:  Lower abdominal pain. EXAM: TRANSABDOMINAL AND TRANSVAGINAL ULTRASOUND OF PELVIS DOPPLER ULTRASOUND OF OVARIES TECHNIQUE: Both transabdominal and transvaginal ultrasound examinations of the pelvis were performed. Transabdominal technique was performed for global imaging of the pelvis including uterus, ovaries, adnexal regions, and pelvic cul-de-sac. It was necessary to proceed with endovaginal exam following the transabdominal exam to visualize the uterus, endometrium and ovaries. Color and duplex Doppler ultrasound was utilized to evaluate blood flow to the ovaries. COMPARISON:  CT 03/09/2022, pelvic ultrasound 11/20/2021 FINDINGS: Uterus Measurements: 5.5 x 3.1 x 4.1 cm = volume: 36 mL. The uterus is retroverted. No fibroids or other mass visualized. Endometrium Thickness: 10 mm. The previous possible endometrial polyp is not seen on the current exam. Right ovary Measurements: 3.7 x 2.4 x 2.3 cm = volume: 10 mL. Increased number of relative uniformly sized follicles. There may be slight echogenic ovarian stroma. No dominant cyst or solid lesion. Normal ovarian blood flow. Left  ovary Measurements: 3.2 x 2.9 x 2.4 cm = volume: 11.6 mL. Increased number of relative uniformly sized follicles. There may be slight echogenic ovarian stroma. No dominant cyst or solid lesion. Normal blood flow. Hypoechoic structure in the left adnexa measuring 8 mm short axis is the appearance of a pelvic lymph node. Pulsed Doppler evaluation of both ovaries demonstrates normal low-resistance arterial and venous waveforms. Other findings Moderate amount of free fluid in the pelvis, appearing simple. IMPRESSION: 1. Sonographic appearance of the ovaries can be seen in the setting of polycystic ovarian syndrome. 2. Normal ovarian blood flow. 3. The previous questioned endometrial polyp is not seen on the current exam. 4. Moderate amount of free fluid in the pelvis, appearing simple. Electronically Signed   By: Keith Rake M.D.   On: 03/11/2022 15:14   CT Angio Chest Pulmonary Embolism (PE) W or WO Contrast  Result Date: 03/11/2022 CLINICAL DATA:  Pulmonary embolism (PE) suspected, positive D-dimer. Tachycardia, fever, dyspnea EXAM: CT ANGIOGRAPHY CHEST WITH CONTRAST TECHNIQUE: Multidetector CT imaging of the chest was performed using the standard protocol during bolus administration of intravenous contrast. Multiplanar CT image reconstructions and MIPs were obtained to evaluate the vascular anatomy. RADIATION DOSE REDUCTION: This exam was performed according to the departmental dose-optimization program which includes automated exposure control, adjustment of the mA and/or kV according to patient size and/or use of iterative reconstruction technique. CONTRAST:  110m OMNIPAQUE IOHEXOL 350 MG/ML SOLN COMPARISON:  None Available. FINDINGS: Cardiovascular: Adequate opacification of the pulmonary arterial tree. No intraluminal filling defect identified to suggest acute pulmonary embolism. Central pulmonary arteries are of normal caliber. No significant coronary artery calcification. Cardiac size within normal  limits. No pericardial effusion. No significant atherosclerotic calcification within the thoracic aorta. No aortic aneurysm. Mediastinum/Nodes: No enlarged mediastinal, hilar, or axillary lymph nodes. Thyroid gland, trachea, and esophagus demonstrate no significant findings. Lungs/Pleura: Lungs are clear. No pleural effusion or pneumothorax. Upper Abdomen: No acute abnormality. Musculoskeletal: No chest wall abnormality. No acute or significant osseous findings. Review of the MIP images confirms the above findings. IMPRESSION: No pulmonary embolism. Normal examination. Electronically Signed   By: AFidela SalisburyM.D.   On: 03/11/2022 04:00   CT Abdomen Pelvis W Contrast  Result Date: 03/09/2022 CLINICAL DATA:  RLQ abdominal pain (Age >= 14y) Abdominal pain, fever and back pain for  3 days. Nausea and diarrhea. EXAM: CT ABDOMEN AND PELVIS WITH CONTRAST TECHNIQUE: Multidetector CT imaging of the abdomen and pelvis was performed using the standard protocol following bolus administration of intravenous contrast. RADIATION DOSE REDUCTION: This exam was performed according to the departmental dose-optimization program which includes automated exposure control, adjustment of the mA and/or kV according to patient size and/or use of iterative reconstruction technique. CONTRAST:  54m OMNIPAQUE IOHEXOL 300 MG/ML  SOLN COMPARISON:  No prior CT.  Pelvic ultrasound 11/20/2021 FINDINGS: Lower chest: Clear lung bases. Hepatobiliary: Mild decreased hepatic density typical of steatosis. No focal hepatic abnormality. Partially distended gallbladder. No calcified gallstone. No biliary dilatation. Pancreas: No ductal dilatation or inflammation. Spleen: Normal in size without focal abnormality. Adrenals/Urinary Tract: Normal adrenal glands. No hydronephrosis or perinephric edema. Homogeneous renal enhancement. No renal calculi or focal lesion. Urinary bladder is physiologically distended without wall thickening. Stomach/Bowel: Normal  appendix, appreciated on coronal series 5, images 57 and 58. Stomach is partially distended. Minimal fecalization of small bowel contents in the pelvis. No small bowel inflammation or obstruction. There is mild wall thickening involving the distal sigmoid colon and rectum. Vascular/Lymphatic: Normal caliber abdominal aorta. Patent portal and splenic veins. There are multiple prominent retroperitoneal and external iliac lymph nodes, not enlarged by size criteria Reproductive: There is generalized stranding of the right and left pelvic fat. Small amount of presacral non organized fluid. The uterus is anteverted. There is a small amount of fluid in the lower endometrial canal. The ovaries are symmetric in size. No adnexal mass. No evidence of pyo- or hydrosalpinx. Other: Generalized edema of the intrapelvic fat. Small amount of non organized presacral fluid. No drainable or focal fluid collection. No free air. Musculoskeletal: There are no acute or suspicious osseous abnormalities. IMPRESSION: 1. Generalized edema of the intrapelvic fat with small amount of non organized presacral fluid. There is wall thickening of the distal sigmoid colon and rectum. Inflammatory change may be secondary to proctocolitis or pelvic inflammatory disease with reactive wall thickening. 2. Normal appendix. 3. Mild hepatic steatosis. 4. Multiple prominent retroperitoneal and external iliac lymph nodes, not enlarged by size criteria, likely reactive. Electronically Signed   By: MKeith RakeM.D.   On: 03/09/2022 22:01    Microbiology: Results for orders placed or performed during the hospital encounter of 03/09/22  Resp Panel by RT-PCR (Flu A&B, Covid) Anterior Nasal Swab     Status: None   Collection Time: 03/09/22  5:45 PM   Specimen: Anterior Nasal Swab  Result Value Ref Range Status   SARS Coronavirus 2 by RT PCR NEGATIVE NEGATIVE Final    Comment: (NOTE) SARS-CoV-2 target nucleic acids are NOT DETECTED.  The SARS-CoV-2  RNA is generally detectable in upper respiratory specimens during the acute phase of infection. The lowest concentration of SARS-CoV-2 viral copies this assay can detect is 138 copies/mL. A negative result does not preclude SARS-Cov-2 infection and should not be used as the sole basis for treatment or other patient management decisions. A negative result may occur with  improper specimen collection/handling, submission of specimen other than nasopharyngeal swab, presence of viral mutation(s) within the areas targeted by this assay, and inadequate number of viral copies(<138 copies/mL). A negative result must be combined with clinical observations, patient history, and epidemiological information. The expected result is Negative.  Fact Sheet for Patients:  hEntrepreneurPulse.com.au Fact Sheet for Healthcare Providers:  hIncredibleEmployment.be This test is no t yet approved or cleared by the [place]  has been authorized for detection and/or diagnosis of SARS-CoV-2 by FDA under an Emergency Use Authorization (EUA). This EUA will remain  in effect (meaning this test can be used) for the duration of the COVID-19 declaration under Section 564(b)(1) of the Act, 21 U.S.C.section 360bbb-3(b)(1), unless the authorization is terminated  or revoked sooner.       Influenza A by PCR NEGATIVE NEGATIVE Final   Influenza B by PCR NEGATIVE NEGATIVE Final    Comment: (NOTE) The Xpert Xpress SARS-CoV-2/FLU/RSV plus assay is intended as an aid in the diagnosis of influenza from Nasopharyngeal swab specimens and should not be used as a sole basis for treatment. Nasal washings and aspirates are unacceptable for Xpert Xpress SARS-CoV-2/FLU/RSV testing.  Fact Sheet for Patients: EntrepreneurPulse.com.au  Fact Sheet for Healthcare Providers: IncredibleEmployment.be  This test is not yet approved or cleared by the  [place] FDA and has been authorized for detection and/or diagnosis of SARS-CoV-2 by FDA under an Emergency Use Authorization (EUA). This EUA will remain in effect (meaning this test can be used) for the duration of the COVID-19 declaration under Section 564(b)(1) of the Act, 21 U.S.C. section 360bbb-3(b)(1), unless the authorization is terminated or revoked.  Performed at [facility], [address], [place], [place] [address]   Urine Culture     Status: Abnormal   Collection Time: 03/09/22 10:37 PM   Specimen: Urine, Clean Catch  Result Value Ref Range Status   Specimen Description   Final    URINE, CLEAN CATCH Performed at [facility], [address] [place], [place] [address]    Special Requests   Final    NONE Performed at [facility], [address], [place], [place] [address]    Culture MULTIPLE SPECIES PRESENT, SUGGEST RECOLLECTION (A)  Final   Report Status 03/11/2022 FINAL  Final  Wet prep, genital     Status: None   Collection Time: 03/09/22 10:37 PM   Specimen: PATH Cytology Cervicovaginal Ancillary Only  Result Value Ref Range Status   Yeast Wet Prep HPF POC NONE SEEN NONE SEEN Final   Trich, Wet Prep NONE SEEN NONE SEEN Final   Clue Cells Wet Prep HPF POC NONE SEEN NONE SEEN Final   WBC, Wet Prep HPF POC <10 <10 Final   Sperm NONE SEEN  Final    Comment: Performed at [facility], [address] [place], [place] [address]  Culture, blood (Routine X 2) w Reflex to ID Panel     Status: None   Collection Time: 03/10/22 11:55 AM   Specimen: BLOOD  Result Value Ref Range Status   Specimen Description   Final    BLOOD LEFT ANTECUBITAL Performed at [facility] [address] [place], [place] [address]    Special Requests   Final    BOTTLES DRAWN AEROBIC AND ANAEROBIC Blood Culture results may not be optimal due to an inadequate volume of blood received in  culture bottles Performed at [facility] [address], [place], [place] [address]    Culture   Final    NO GROWTH 5 DAYS Performed at [facility], [place] [address] [place], [place] [address]    Report Status 03/15/2022 FINAL  Final  Culture, blood (Routine X 2) w Reflex to ID Panel     Status: None   Collection Time: 03/10/22 12:07 PM   Specimen: BLOOD  Result Value Ref Range Status   Specimen Description  Final    BLOOD LEFT ANTECUBITAL Performed at [facility] [address] [place], [place] [address]    Special Requests   Final    BOTTLES DRAWN AEROBIC AND ANAEROBIC Blood Culture results may not be optimal due to an inadequate volume of blood received in culture bottles Performed at [facility] [address], [place], [place] [address]    Culture   Final    NO GROWTH 5 DAYS Performed at [facility], [place] [address] [place], [place] [address]    Report Status 03/15/2022 FINAL  Final  Culture, blood (Routine X 2) w Reflex to ID Panel     Status: None   Collection Time: 03/11/22  7:14 PM   Specimen: BLOOD  Result Value Ref Range Status   Specimen Description   Final    BLOOD BLOOD RIGHT ARM Performed at [facility] [address] [place], [place] [address]    Special Requests   Final    IN PEDIATRIC BOTTLE Blood Culture adequate volume Performed at [facility] [address], [place], [place] [address]    Culture   Final    NO GROWTH 5 DAYS Performed at [facility], [place] [address] [place], [place] [address]    Report Status 03/16/2022 FINAL  Final  Culture, blood (Routine X 2) w Reflex to ID Panel     Status: None   Collection Time: 03/12/22  5:21 AM   Specimen: BLOOD  Result Value Ref Range Status   Specimen Description   Final    BLOOD BLOOD RIGHT ARM Performed at [facility] [address], [place], [place]  [address]    Special Requests   Final    BOTTLES DRAWN AEROBIC AND ANAEROBIC Blood Culture adequate volume Performed at [facility] [address] [place], [place] [address]    Culture   Final    NO GROWTH 5 DAYS Performed at [facility], [place] [address], [place], [place] [address]    Report Status 03/17/2022 FINAL  Final  Aerobic/Anaerobic Culture w Gram Stain (surgical/deep wound)     Status: None (Preliminary result)   Collection Time: 03/14/22  2:20 PM   Specimen: PATH Other; Body Fluid  Result Value Ref Range Status   Specimen Description   Final    FLUID Performed at [facility] [address] [place], [place] [address]    Special Requests   Final    FLUID Performed at [facility], [place] [address], [place], [place] [address]    Gram Stain   Final    MODERATE WBC PRESENT, PREDOMINANTLY PMN FEW GRAM POSITIVE COCCI IN PAIRS IN CLUSTERS RARE YEAST    Culture   Final    ABUNDANT ENTEROCOCCUS FAECALIS CULTURE REINCUBATED FOR BETTER GROWTH NO ANAEROBES ISOLATED Performed at [facility], [address], [place], [place] [address]    Report Status PENDING  Incomplete   Organism ID, Bacteria ENTEROCOCCUS FAECALIS  Final      Susceptibility   Enterococcus faecalis - MIC*    AMPICILLIN <=2 SENSITIVE Sensitive     VANCOMYCIN 1 SENSITIVE Sensitive     GENTAMICIN SYNERGY RESISTANT Resistant     * ABUNDANT ENTEROCOCCUS FAECALIS   Labs: CBC: Recent Labs  Lab 03/12/22 0807 03/13/22 0441 03/14/22 0532 03/15/22 0755 03/16/22 0418  WBC 8.7 7.8 8.9 12.5* 9.8  NEUTROABS 5.8 4.6 5.1  --  4.9  HGB 8.5* 9.1* 9.0* 9.5* 9.4*  HCT 28.1* 29.9* 29.4* 31.7* 31.7*  MCV 82.6 81.0 81.4 82.6 82.8  PLT 253 294 324 354 886   Basic Metabolic Panel: Recent Labs  Lab 03/12/22 0521 03/13/22 0441 03/14/22 0532 03/15/22 0816 03/16/22 0418  NA 139 141 140 138 142  K 3.8 3.8 3.5 4.2 4.0  CL 111 113* 108 107 111  CO2  20* 24 22 24 25   GLUCOSE 100* 93 115* 120* 105*  BUN 5* <5* 5* 10 12  CREATININE 0.77 0.80 0.81 0.66 0.69  CALCIUM 8.2* 8.5* 8.3* 8.7* 8.5*  MG 1.9 2.0 1.9 2.2 2.2  PHOS 2.7 4.2 4.9* 4.4 3.9   Liver Function Tests: Recent Labs  Lab 03/12/22 0521 03/13/22 0441 03/14/22 0532 03/15/22 0816 03/16/22 0418  AST 17 20 25 28 25   ALT 21 19 21 25 25   ALKPHOS 70 62 60 65 57  BILITOT 0.4 0.5 0.2* 0.3 0.2*  PROT 6.7 6.9 6.8 7.6 7.2  ALBUMIN 2.8* 2.8* 2.7* 2.9* 2.9*   CBG: No results for input(s): "GLUCAP" in the last 168 hours.  Discharge time spent: greater than 30 minutes.  Signed: Raiford Noble, DO [facility] 03/17/2022

## 2022-03-18 LAB — AEROBIC/ANAEROBIC CULTURE W GRAM STAIN (SURGICAL/DEEP WOUND)

## 2022-03-31 ENCOUNTER — Ambulatory Visit (INDEPENDENT_AMBULATORY_CARE_PROVIDER_SITE_OTHER): Payer: Medicaid Other | Admitting: Obstetrics & Gynecology

## 2022-03-31 ENCOUNTER — Encounter: Payer: Self-pay | Admitting: Obstetrics & Gynecology

## 2022-03-31 VITALS — BP 116/77 | HR 91

## 2022-03-31 DIAGNOSIS — K611 Rectal abscess: Secondary | ICD-10-CM

## 2022-03-31 DIAGNOSIS — A749 Chlamydial infection, unspecified: Secondary | ICD-10-CM | POA: Diagnosis not present

## 2022-03-31 DIAGNOSIS — K529 Noninfective gastroenteritis and colitis, unspecified: Secondary | ICD-10-CM | POA: Diagnosis not present

## 2022-03-31 DIAGNOSIS — Z3041 Encounter for surveillance of contraceptive pills: Secondary | ICD-10-CM | POA: Diagnosis not present

## 2022-03-31 MED ORDER — NORETHIN ACE-ETH ESTRAD-FE 1-20 MG-MCG(24) PO TABS: 1.0000 | ORAL_TABLET | Freq: Every day | ORAL | 11 refills | Status: DC

## 2022-03-31 NOTE — Progress Notes (Signed)
GYNECOLOGY OFFICE VISIT NOTE  History:   Jacqueline Gonzalez is a 21 y.o. G0P0000 here today for follow up after recent admission for rectal abscess and proctocolitis as a result of chlamydia.  Just finished her antibiotic course in the last week.  She reports continued residual pelvic pain, not worse than before. No bleeding since around 11/2021 when she was initially diagnosed with chlamydia.  She desires refill of her OCPs, but lower dose as she had nausea and vomiting. She denies any abnormal vaginal discharge, bleeding or other concerns.    Past Medical History:  Diagnosis Date   Anemia    Anxiety    Chlamydia 11/13/2021   Constipation, chronic    Depression    Environmental allergies    Ganglion cyst of dorsum of left wrist    RECURRENT   GERD (gastroesophageal reflux disease)    watches diet   Headache    Hypertension    IBS (irritable bowel syndrome)    Lactose intolerance     Past Surgical History:  Procedure Laterality Date   GANGLION CYST EXCISION Left 04/24/2021   Procedure: left recurrent dorsal carpal ganglion excision;  Surgeon: [name], MD;  Location: [facility];  Service: Orthopedics;  Laterality: Left;   INCISION AND DRAINAGE PERIRECTAL ABSCESS N/A 03/14/2022   Procedure: IRRIGATION AND DEBRIDEMENT PERIRECTAL ABSCESS;  Surgeon: Michael Boston, MD;  Location: [facility];  Service: General;  Laterality: N/A;   TONSILLECTOMY AND ADENOIDECTOMY     AGE 37   WISDOM TOOTH EXTRACTION  03/18/2021   WRIST GANGLION EXCISION Left 02/2020   The following portions of the patient's history were reviewed and updated as appropriate: allergies, current medications, past family history, past medical history, past social history, past surgical history and problem list.   Review of Systems:  Pertinent items noted in HPI and remainder of comprehensive ROS otherwise negative.  Physical Exam:  BP 116/77   Pulse 91   LMP  (LMP Unknown)  CONSTITUTIONAL:  Well-developed, well-nourished female in no acute distress.  HEENT:  Normocephalic, atraumatic. External right and left ear normal. No scleral icterus.  NECK: Normal range of motion, supple, no masses noted on observation SKIN: No rash noted. Not diaphoretic. No erythema. No pallor. MUSCULOSKELETAL: Normal range of motion. No edema noted. NEUROLOGIC: Alert and oriented to person, place, and time. Normal muscle tone coordination. No cranial nerve deficit noted. PSYCHIATRIC: Normal mood and affect. Normal behavior. Normal judgment and thought content. CARDIOVASCULAR: Normal heart rate noted RESPIRATORY: Effort and breath sounds normal, no problems with respiration noted ABDOMEN: No masses noted. No other overt distention noted.   PELVIC: Deferred  Labs and Imaging No results found for this or any previous visit (from the past 168 hour(s)). MR PELVIS W WO CONTRAST  Result Date: 03/13/2022 CLINICAL DATA:  Anorectal abscess EXAM: MRI PELVIS WITHOUT AND WITH CONTRAST TECHNIQUE: Multiplanar multisequence MR imaging of the pelvis was performed both before and after administration of intravenous contrast. CONTRAST:  15m GADAVIST GADOBUTROL 1 MMOL/ML IV SOLN COMPARISON:  CT abdomen/pelvis dated 03/09/2022 FINDINGS: Urinary Tract:  Bladder is within normal limits. Bowel: Sigmoid colon is decompressed. Rectal wall thickening with pericolonic inflammatory changes. This appearance is suspicious for infectious/inflammatory proctocolitis. Vascular/Lymphatic: No evidence of aneurysm. Progressive presacral and perirectal lymphadenopathy measuring up to 10 mm short axis (series 7/image 10), likely inflammatory. Reproductive: Uterus and bilateral ovaries are within normal limits. Other: Small volume pelvic ascites, progressive. Presacral fluid with inflammatory change. Interval development of a 2.8 x  3.7 x 4.4 cm complex fluid collection in the left perirectal space (series 7/image 20), progressive and more organized  when compared to recent CT, compatible with developing abscess. Musculoskeletal: No focal osseous lesions. IMPRESSION: Progressive rectal wall thickening, suspicious for infectious/inflammatory proctocolitis. Developing 4.4 cm left perirectal abscess, new/progressive. Associated progressive lymphadenopathy and small volume pelvic ascites. Electronically Signed   By: Julian Hy M.D.   On: 03/13/2022 18:24   US PELVIC COMPLETE W TRANSVAGINAL AND TORSION R/O  Result Date: 03/11/2022 CLINICAL DATA:  Lower abdominal pain. EXAM: TRANSABDOMINAL AND TRANSVAGINAL ULTRASOUND OF PELVIS DOPPLER ULTRASOUND OF OVARIES TECHNIQUE: Both transabdominal and transvaginal ultrasound examinations of the pelvis were performed. Transabdominal technique was performed for global imaging of the pelvis including uterus, ovaries, adnexal regions, and pelvic cul-de-sac. It was necessary to proceed with endovaginal exam following the transabdominal exam to visualize the uterus, endometrium and ovaries. Color and duplex Doppler ultrasound was utilized to evaluate blood flow to the ovaries. COMPARISON:  CT 03/09/2022, pelvic ultrasound 11/20/2021 FINDINGS: Uterus Measurements: 5.5 x 3.1 x 4.1 cm = volume: 36 mL. The uterus is retroverted. No fibroids or other mass visualized. Endometrium Thickness: 10 mm. The previous possible endometrial polyp is not seen on the current exam. Right ovary Measurements: 3.7 x 2.4 x 2.3 cm = volume: 10 mL. Increased number of relative uniformly sized follicles. There may be slight echogenic ovarian stroma. No dominant cyst or solid lesion. Normal ovarian blood flow. Left ovary Measurements: 3.2 x 2.9 x 2.4 cm = volume: 11.6 mL. Increased number of relative uniformly sized follicles. There may be slight echogenic ovarian stroma. No dominant cyst or solid lesion. Normal blood flow. Hypoechoic structure in the left adnexa measuring 8 mm short axis is the appearance of a pelvic lymph node. Pulsed Doppler  evaluation of both ovaries demonstrates normal low-resistance arterial and venous waveforms. Other findings Moderate amount of free fluid in the pelvis, appearing simple. IMPRESSION: 1. Sonographic appearance of the ovaries can be seen in the setting of polycystic ovarian syndrome. 2. Normal ovarian blood flow. 3. The previous questioned endometrial polyp is not seen on the current exam. 4. Moderate amount of free fluid in the pelvis, appearing simple. Electronically Signed   By: Keith Rake M.D.   On: 03/11/2022 15:14   CT Angio Chest Pulmonary Embolism (PE) W or WO Contrast  Result Date: 03/11/2022 CLINICAL DATA:  Pulmonary embolism (PE) suspected, positive D-dimer. Tachycardia, fever, dyspnea EXAM: CT ANGIOGRAPHY CHEST WITH CONTRAST TECHNIQUE: Multidetector CT imaging of the chest was performed using the standard protocol during bolus administration of intravenous contrast. Multiplanar CT image reconstructions and MIPs were obtained to evaluate the vascular anatomy. RADIATION DOSE REDUCTION: This exam was performed according to the departmental dose-optimization program which includes automated exposure control, adjustment of the mA and/or kV according to patient size and/or use of iterative reconstruction technique. CONTRAST:  123m OMNIPAQUE IOHEXOL 350 MG/ML SOLN COMPARISON:  None Available. FINDINGS: Cardiovascular: Adequate opacification of the pulmonary arterial tree. No intraluminal filling defect identified to suggest acute pulmonary embolism. Central pulmonary arteries are of normal caliber. No significant coronary artery calcification. Cardiac size within normal limits. No pericardial effusion. No significant atherosclerotic calcification within the thoracic aorta. No aortic aneurysm. Mediastinum/Nodes: No enlarged mediastinal, hilar, or axillary lymph nodes. Thyroid gland, trachea, and esophagus demonstrate no significant findings. Lungs/Pleura: Lungs are clear. No pleural effusion or  pneumothorax. Upper Abdomen: No acute abnormality. Musculoskeletal: No chest wall abnormality. No acute or significant osseous findings. Review of the MIP  images confirms the above findings. IMPRESSION: No pulmonary embolism. Normal examination. Electronically Signed   By: Fidela Salisbury M.D.   On: 03/11/2022 04:00   CT Abdomen Pelvis W Contrast  Result Date: 03/09/2022 CLINICAL DATA:  RLQ abdominal pain (Age >= 14y) Abdominal pain, fever and back pain for 3 days. Nausea and diarrhea. EXAM: CT ABDOMEN AND PELVIS WITH CONTRAST TECHNIQUE: Multidetector CT imaging of the abdomen and pelvis was performed using the standard protocol following bolus administration of intravenous contrast. RADIATION DOSE REDUCTION: This exam was performed according to the departmental dose-optimization program which includes automated exposure control, adjustment of the mA and/or kV according to patient size and/or use of iterative reconstruction technique. CONTRAST:  75m OMNIPAQUE IOHEXOL 300 MG/ML  SOLN COMPARISON:  No prior CT.  Pelvic ultrasound 11/20/2021 FINDINGS: Lower chest: Clear lung bases. Hepatobiliary: Mild decreased hepatic density typical of steatosis. No focal hepatic abnormality. Partially distended gallbladder. No calcified gallstone. No biliary dilatation. Pancreas: No ductal dilatation or inflammation. Spleen: Normal in size without focal abnormality. Adrenals/Urinary Tract: Normal adrenal glands. No hydronephrosis or perinephric edema. Homogeneous renal enhancement. No renal calculi or focal lesion. Urinary bladder is physiologically distended without wall thickening. Stomach/Bowel: Normal appendix, appreciated on coronal series 5, images 57 and 58. Stomach is partially distended. Minimal fecalization of small bowel contents in the pelvis. No small bowel inflammation or obstruction. There is mild wall thickening involving the distal sigmoid colon and rectum. Vascular/Lymphatic: Normal caliber abdominal aorta.  Patent portal and splenic veins. There are multiple prominent retroperitoneal and external iliac lymph nodes, not enlarged by size criteria Reproductive: There is generalized stranding of the right and left pelvic fat. Small amount of presacral non organized fluid. The uterus is anteverted. There is a small amount of fluid in the lower endometrial canal. The ovaries are symmetric in size. No adnexal mass. No evidence of pyo- or hydrosalpinx. Other: Generalized edema of the intrapelvic fat. Small amount of non organized presacral fluid. No drainable or focal fluid collection. No free air. Musculoskeletal: There are no acute or suspicious osseous abnormalities. IMPRESSION: 1. Generalized edema of the intrapelvic fat with small amount of non organized presacral fluid. There is wall thickening of the distal sigmoid colon and rectum. Inflammatory change may be secondary to proctocolitis or pelvic inflammatory disease with reactive wall thickening. 2. Normal appendix. 3. Mild hepatic steatosis. 4. Multiple prominent retroperitoneal and external iliac lymph nodes, not enlarged by size criteria, likely reactive. Electronically Signed   By: MKeith RakeM.D.   On: 03/09/2022 22:01      Assessment and Plan:     1. Chlamydia 2. Rectal abscess supralevator s/p I&D 03/14/2022 3. Proctocolitis Discussed this complication of chlamydia, need for safe sex reinforced. It is too early to do a test of cure, patient will return in 2 weeks for this.  - Cervicovaginal ancillary only; Future  4. Oral contraceptive pill surveillance Loestrin 24 Fe prescribed, will monitor effect.  - Norethindrone Acetate-Ethinyl Estrad-FE (LOESTRIN 24 FE) 1-20 MG-MCG(24) tablet; Take 1 tablet by mouth daily.  Dispense: 20 tablet; Refill: 11  Routine preventative health maintenance measures emphasized, she just turned 21 and will get a pap smear scheduled soon.  Please refer to After Visit Summary for other counseling recommendations.    Return in about 2 weeks (around 04/14/2022) for RN visit for test of cure for chlamydia.    I spent 20 minutes dedicated to the care of this patient including pre-visit review of records, face to face time with  the patient discussing her conditions and treatments and post visit orders.    Verita Schneiders, MD, [place] [employer], [place]

## 2022-04-16 ENCOUNTER — Encounter (HOSPITAL_BASED_OUTPATIENT_CLINIC_OR_DEPARTMENT_OTHER): Payer: Self-pay | Admitting: Emergency Medicine

## 2022-04-16 ENCOUNTER — Emergency Department (HOSPITAL_BASED_OUTPATIENT_CLINIC_OR_DEPARTMENT_OTHER): Payer: Medicaid Other

## 2022-04-16 ENCOUNTER — Ambulatory Visit: Payer: Medicaid Other

## 2022-04-16 ENCOUNTER — Other Ambulatory Visit: Payer: Self-pay

## 2022-04-16 ENCOUNTER — Emergency Department (HOSPITAL_BASED_OUTPATIENT_CLINIC_OR_DEPARTMENT_OTHER)
Admission: EM | Admit: 2022-04-16 | Discharge: 2022-04-16 | Disposition: A | Discharge status: Home / Self Care | Payer: Medicaid Other | Attending: Emergency Medicine | Admitting: Emergency Medicine

## 2022-04-16 DIAGNOSIS — N9489 Other specified conditions associated with female genital organs and menstrual cycle: Secondary | ICD-10-CM | POA: Diagnosis not present

## 2022-04-16 DIAGNOSIS — Z7982 Long term (current) use of aspirin: Secondary | ICD-10-CM | POA: Diagnosis not present

## 2022-04-16 DIAGNOSIS — R197 Diarrhea, unspecified: Secondary | ICD-10-CM | POA: Diagnosis not present

## 2022-04-16 DIAGNOSIS — K6289 Other specified diseases of anus and rectum: Secondary | ICD-10-CM | POA: Diagnosis not present

## 2022-04-16 DIAGNOSIS — I10 Essential (primary) hypertension: Secondary | ICD-10-CM | POA: Insufficient documentation

## 2022-04-16 DIAGNOSIS — R1084 Generalized abdominal pain: Secondary | ICD-10-CM | POA: Diagnosis not present

## 2022-04-16 DIAGNOSIS — Z79899 Other long term (current) drug therapy: Secondary | ICD-10-CM | POA: Insufficient documentation

## 2022-04-16 DIAGNOSIS — J069 Acute upper respiratory infection, unspecified: Secondary | ICD-10-CM

## 2022-04-16 DIAGNOSIS — R1030 Lower abdominal pain, unspecified: Secondary | ICD-10-CM | POA: Diagnosis present

## 2022-04-16 LAB — CBC
HCT: 34.3 % — ABNORMAL LOW (ref 36.0–46.0)
Hemoglobin: 10.5 g/dL — ABNORMAL LOW (ref 12.0–15.0)
MCH: 25 pg — ABNORMAL LOW (ref 26.0–34.0)
MCHC: 30.6 g/dL (ref 30.0–36.0)
MCV: 81.7 fL (ref 80.0–100.0)
Platelets: 278 10*3/uL (ref 150–400)
RBC: 4.2 MIL/uL (ref 3.87–5.11)
RDW: 17 % — ABNORMAL HIGH (ref 11.5–15.5)
WBC: 7.7 10*3/uL (ref 4.0–10.5)
nRBC: 0 % (ref 0.0–0.2)

## 2022-04-16 LAB — COMPREHENSIVE METABOLIC PANEL
ALT: 21 U/L (ref 0–44)
AST: 14 U/L — ABNORMAL LOW (ref 15–41)
Albumin: 4.4 g/dL (ref 3.5–5.0)
Alkaline Phosphatase: 75 U/L (ref 38–126)
Anion gap: 11 (ref 5–15)
BUN: 6 mg/dL (ref 6–20)
CO2: 21 mmol/L — ABNORMAL LOW (ref 22–32)
Calcium: 9.3 mg/dL (ref 8.9–10.3)
Chloride: 103 mmol/L (ref 98–111)
Creatinine, Ser: 0.6 mg/dL (ref 0.44–1.00)
GFR, Estimated: >60 mL/min (ref >60)
Glucose, Bld: 177 mg/dL — ABNORMAL HIGH (ref 70–99)
Potassium: 3.4 mmol/L — ABNORMAL LOW (ref 3.5–5.1)
Sodium: 135 mmol/L (ref 135–145)
Total Bilirubin: 0.2 mg/dL — ABNORMAL LOW (ref 0.3–1.2)
Total Protein: 8 g/dL (ref 6.5–8.1)

## 2022-04-16 LAB — URINALYSIS, ROUTINE W REFLEX MICROSCOPIC
Bilirubin Urine: NEGATIVE
Glucose, UA: NEGATIVE mg/dL
Hgb urine dipstick: NEGATIVE
Ketones, ur: NEGATIVE mg/dL
Leukocytes,Ua: NEGATIVE
Nitrite: NEGATIVE
Specific Gravity, Urine: 1.024 (ref 1.005–1.030)
pH: 6.5 (ref 5.0–8.0)

## 2022-04-16 LAB — HCG, SERUM, QUALITATIVE: Preg, Serum: NEGATIVE

## 2022-04-16 LAB — LIPASE, BLOOD: Lipase: <10 U/L — ABNORMAL LOW (ref 11–51)

## 2022-04-16 MED ORDER — ONDANSETRON HCL 4 MG/2ML IJ SOLN
4.0000 mg | Freq: Once | INTRAMUSCULAR | Status: AC
  Administered 2022-04-16: 4 mg via INTRAVENOUS
  Filled 2022-04-16: qty 2

## 2022-04-16 MED ORDER — KETOROLAC TROMETHAMINE 15 MG/ML IJ SOLN
15.0000 mg | Freq: Once | INTRAMUSCULAR | Status: AC
  Administered 2022-04-16: 15 mg via INTRAVENOUS
  Filled 2022-04-16: qty 1

## 2022-04-16 MED ORDER — IOHEXOL 300 MG/ML  SOLN
100.0000 mL | Freq: Once | INTRAMUSCULAR | Status: AC | PRN
  Administered 2022-04-16: 80 mL via INTRAVENOUS

## 2022-04-16 MED ORDER — LACTATED RINGERS IV BOLUS
1000.0000 mL | Freq: Once | INTRAVENOUS | Status: AC
  Administered 2022-04-16: 1000 mL via INTRAVENOUS

## 2022-04-16 MED ORDER — ONDANSETRON HCL 4 MG PO TABS: 4.0000 mg | ORAL_TABLET | Freq: Three times a day (TID) | ORAL | 0 refills | Status: DC | PRN

## 2022-04-16 NOTE — Discharge Instructions (Addendum)
We evaluated you for your abdominal pain.  We repeated the CT scan, and today did not show any abscesses or intra-abdominal infection.  Please follow-up with your surgeon and with the gastroenterologist.  Please return to the emergency department if you develop worsening or severe pain, persistent vomiting, blood in your stool, fevers, or any other concerning symptoms.

## 2022-04-16 NOTE — ED Triage Notes (Signed)
Pt arrives to ED with c/o abdominal pain and diarrhea x1 month.

## 2022-04-16 NOTE — ED Provider Notes (Signed)
[facility] [place] EMERGENCY DEPT Provider Note  CSN: [id number] Arrival date & time: 04/16/22 4562  Chief Complaint(s) Abdominal Pain and Diarrhea  HPI Jacqueline Gonzalez is a 21 y.o. female with history of chronic constipation, recent hospitalization for apparent chlamydia PID plus chlamydia proctitis, complicated by perirectal abscess drained by surgery, resenting with abdominal pain.  Patient reports that since discharge, she has been continuing to have abdominal pain in the lower abdomen for the last month.  She reports the pain is constant.  She also reports mild rectal pain.  She reports ongoing diarrhea.  She followed up with surgery, who recommended sitz bath that she was found to have rectal fissure.  She denies vomiting but reports nausea.  She reports occasional chills, no vomiting.  Completed her antibiotics for her infectious problems.   Past Medical History Past Medical History:  Diagnosis Date   Anemia    Anxiety    Chlamydia 11/13/2021   Constipation, chronic    Depression    Environmental allergies    Ganglion cyst of dorsum of left wrist    RECURRENT   GERD (gastroesophageal reflux disease)    watches diet   Headache    Hypertension    IBS (irritable bowel syndrome)    Lactose intolerance    Patient Active Problem List   Diagnosis Date Noted   Rectal abscess supralevator s/p I&D 03/14/2022 03/14/2022   Rectal pain    UTI (urinary tract infection) 03/10/2022   Rectal bleeding    Anal fissure    Abnormal CT scan, colon    Proctocolitis 03/09/2022   Chlamydia 11/18/2021   Menorrhagia with irregular cycle 11/18/2021   Dysmenorrhea 11/18/2021   Chronic anemia 11/17/2021   Lower abdominal pain 11/07/2021   Moderate episode of recurrent major depressive disorder ([place]) 06/23/2021   Low back pain 05/02/2021   Headache 09/03/2020   Nonspecific abdominal pain 08/30/2020   Ganglion cyst of dorsum of left wrist 11/03/2019   Constipation, chronic 01/21/2018    Irregular periods 01/21/2018   Generalized anxiety disorder 01/21/2018   Home Medication(s) Prior to Admission medications   Medication Sig Start Date End Date Taking? Authorizing Provider  acetaminophen (TYLENOL) 325 MG tablet Take 2 tablets (650 mg total) by mouth every 6 (six) hours as needed for fever or mild pain. 03/16/22   Raiford Noble Latif, DO  AMBULATORY NON FORMULARY MEDICATION Medication Name: Diltiazem 2% gel -Using your index finger, apply a small amount of medication inside the rectum up to your first knuckle/joint three times daily x 6-8 weeks. 03/03/22   Levin Erp, PA  aspirin-acetaminophen-caffeine (EXCEDRIN MIGRAINE) [phone] MG tablet Take 2 tablets by mouth every 6 (six) hours as needed for headache or migraine.    [provider]  baclofen (LIORESAL) 10 MG tablet Take 10 mg by mouth.    [provider]  cetirizine (ZYRTEC) 10 MG tablet Take 10 mg by mouth daily as needed for allergies.    [provider]  FLUoxetine (PROZAC) 40 MG capsule Take 1 capsule (40 mg total) by mouth daily. Patient taking differently: Take 40 mg by mouth every morning. 12/31/21   Shary Key, DO  fluticasone (FLONASE) 50 MCG/ACT nasal spray Place 2 sprays into both nostrils daily. Patient taking differently: Place 2 sprays into both nostrils daily as needed for allergies. 10/19/21   Evelina Dun A, FNP  hyoscyamine (LEVSIN SL) 0.125 MG SL tablet Place 1 tablet (0.125 mg total) under the tongue every 4 (four) hours as needed.  03/03/22   Levin Erp, PA  lubiprostone (AMITIZA) 8 MCG capsule Take 1 capsule (8 mcg total) by mouth 2 (two) times daily with a meal. 03/03/22   Lemmon, Lavone Nian, PA  Norethindrone Acetate-Ethinyl Estrad-FE (LOESTRIN 24 FE) 1-20 MG-MCG(24) tablet Take 1 tablet by mouth daily. 03/31/22   Anyanwu, Sallyanne Havers, MD  ondansetron (ZOFRAN) 4 MG tablet Take 1 tablet (4 mg total) by mouth every 8 (eight) hours as needed for  nausea or vomiting. 04/16/22   Cristie Hem, MD  oxyCODONE (OXY IR/ROXICODONE) 5 MG immediate release tablet Take 1 tablet (5 mg total) by mouth every 6 (six) hours as needed for moderate pain, severe pain or breakthrough pain. Patient not taking: Reported on 03/31/2022 03/16/22   Raiford Noble Latif, DO  polyethylene glycol (MIRALAX / GLYCOLAX) 17 g packet Take 17 g by mouth every 12 (twelve) hours as needed for mild constipation, moderate constipation or severe constipation. 03/16/22   Raiford Noble Latif, DO  Psyllium (METAMUCIL PO) Take 1 packet by mouth daily as needed (constipation).    [provider]  triamcinolone cream (KENALOG) 0.1 % Apply 1 Application topically 2 (two) times daily as needed (eczema). 02/06/22   [provider]  zonisamide (ZONEGRAN) 25 MG capsule Take 50 mg by mouth 2 (two) times daily. Patient not taking: Reported on 03/10/2022 03/09/22   [provider]                                                                                                                                    Past Surgical History Past Surgical History:  Procedure Laterality Date   GANGLION CYST EXCISION Left 04/24/2021   Procedure: left recurrent dorsal carpal ganglion excision;  Surgeon: [name], MD;  Location: [facility];  Service: Orthopedics;  Laterality: Left;   INCISION AND DRAINAGE PERIRECTAL ABSCESS N/A 03/14/2022   Procedure: IRRIGATION AND DEBRIDEMENT PERIRECTAL ABSCESS;  Surgeon: Michael Boston, MD;  Location: [facility];  Service: General;  Laterality: N/A;   TONSILLECTOMY AND ADENOIDECTOMY     AGE 17   WISDOM TOOTH EXTRACTION  03/18/2021   WRIST GANGLION EXCISION Left 02/2020   Family History Family History  Problem Relation Age of Onset   Irritable bowel syndrome Mother    Asthma Father    Diabetes Maternal Grandmother    Diabetes Paternal Grandmother    Kidney disease Paternal Alanda Slim' disease Paternal Aunt     Ovarian cancer Maternal Aunt    Stomach cancer Neg Hx    Esophageal cancer Neg Hx    Colon cancer Neg Hx     Social History Social History   Tobacco Use   Smoking status: Never   Smokeless tobacco: Never  Vaping Use   Vaping Use: Never used  Substance Use Topics   Alcohol use: Never   Drug use: Never   Allergies Blue dyes (parenteral), Blueberry [vaccinium  angustifolium], Raspberry, Shellfish allergy, and Amitriptyline  Review of Systems Review of Systems  All other systems reviewed and are negative.   Physical Exam Vital Signs  I have reviewed the triage vital signs BP 123/72   Pulse 77   Temp 98 F (36.7 C)   Resp 15   Ht 5' 8"  (1.727 m)   Wt 103.4 kg   SpO2 100%   BMI 34.67 kg/m  Physical Exam Vitals and nursing note reviewed.  Constitutional:      General: She is not in acute distress.    Appearance: She is well-developed.  HENT:     Head: Normocephalic and atraumatic.     Mouth/Throat:     Mouth: Mucous membranes are moist.  Eyes:     Pupils: Pupils are equal, round, and reactive to light.  Cardiovascular:     Rate and Rhythm: Normal rate and regular rhythm.     Heart sounds: No murmur heard. Pulmonary:     Effort: Pulmonary effort is normal. No respiratory distress.     Breath sounds: Normal breath sounds.  Abdominal:     General: Abdomen is flat.     Palpations: Abdomen is soft.     Tenderness: There is no abdominal tenderness.  Musculoskeletal:        General: No tenderness.     Right lower leg: No edema.     Left lower leg: No edema.  Skin:    General: Skin is warm and dry.  Neurological:     General: No focal deficit present.     Mental Status: She is alert. Mental status is at baseline.  Psychiatric:        Mood and Affect: Mood normal.        Behavior: Behavior normal.     ED Results and Treatments Labs (all labs ordered are listed, but only abnormal results are displayed) Labs Reviewed  LIPASE, BLOOD - Abnormal; Notable for  the following components:      Result Value   Lipase <10 (*)    All other components within normal limits  COMPREHENSIVE METABOLIC PANEL - Abnormal; Notable for the following components:   Potassium 3.4 (*)    CO2 21 (*)    Glucose, Bld 177 (*)    AST 14 (*)    Total Bilirubin 0.2 (*)    All other components within normal limits  CBC - Abnormal; Notable for the following components:   Hemoglobin 10.5 (*)    HCT 34.3 (*)    MCH 25.0 (*)    RDW 17.0 (*)    All other components within normal limits  URINALYSIS, ROUTINE W REFLEX MICROSCOPIC - Abnormal; Notable for the following components:   Protein, ur TRACE (*)    All other components within normal limits  HCG, SERUM, QUALITATIVE  Radiology CT ABDOMEN PELVIS W CONTRAST  Result Date: 04/16/2022 CLINICAL DATA:  Patient arrives to ED with c/o abdominal pain and diarrhea x1 month. Hx of IBS, chronic constipation EXAM: CT ABDOMEN AND PELVIS WITH CONTRAST TECHNIQUE: Multidetector CT imaging of the abdomen and pelvis was performed using the standard protocol following bolus administration of intravenous contrast. RADIATION DOSE REDUCTION: This exam was performed according to the departmental dose-optimization program which includes automated exposure control, adjustment of the mA and/or kV according to patient size and/or use of iterative reconstruction technique. CONTRAST:  89m OMNIPAQUE IOHEXOL 300 MG/ML  SOLN COMPARISON:  03/09/2022. FINDINGS: Lower chest: Clear lung bases. Hepatobiliary: No focal liver abnormality is seen. No gallstones, gallbladder wall thickening, or biliary dilatation. Pancreas: Unremarkable. No pancreatic ductal dilatation or surrounding inflammatory changes. Spleen: Normal in size without focal abnormality. Adrenals/Urinary Tract: Adrenal glands are unremarkable. Kidneys are normal, without renal calculi,  focal lesion, or hydronephrosis. Bladder is unremarkable. Stomach/Bowel: Stomach is within normal limits. Appendix appears normal. No evidence of bowel wall thickening, distention, or inflammatory changes. Vascular/Lymphatic: No significant vascular findings are present. No enlarged abdominal or pelvic lymph nodes. Reproductive: Uterus and bilateral adnexa are unremarkable. Other: No abdominal wall hernia or abnormality. No abdominopelvic ascites. Musculoskeletal: No acute or significant osseous findings. IMPRESSION: Normal enhanced CT scan of the abdomen and pelvis. Electronically Signed   By: DLajean ManesM.D.   On: 04/16/2022 12:26    Pertinent labs & imaging results that were available during my care of the patient were reviewed by me and considered in my medical decision making (see MDM for details).  Medications Ordered in ED Medications  ketorolac (TORADOL) 15 MG/ML injection 15 mg (15 mg Intravenous Given 04/16/22 1005)  ondansetron (ZOFRAN) injection 4 mg (4 mg Intravenous Given 04/16/22 1004)  lactated ringers bolus 1,000 mL (1,000 mLs Intravenous New Bag/Given 04/16/22 1003)  iohexol (OMNIPAQUE) 300 MG/ML solution 100 mL (80 mLs Intravenous Contrast Given 04/16/22 1210)                                                                                                                                     Procedures Procedures  (including critical care time)  Medical Decision Making / ED Course   MDM:  21year old female with recent hospitalization for PID and proctocolitis complicated by abscess presenting with abdominal pain since discharge.  Patient overall well-appearing, with reassuring vitals.  Labs reassuring with no elevated WBC count.  Differential includes postoperative abscess, recurrent abscess, colitis, proctitis, post operative pain.  Given overall well appearance, lower concern for acute process, suspect possible chronic IBS symptoms.  However given patient is recently  postoperative, will obtain CT scan to further evaluate intra-abdominal pathology or recurrence of her proctocolitis.    Clinical Course as of 04/16/22 1251  Thu Apr 16, 2022  1250 CT scan negative for acute intra-abdominal process.  Patient feels better.  She has follow-up with gastroenterology.  Advised continued symptomatic treatment for abdominal pain.  Will prescribe Zofran for nausea. [WS]    Clinical Course User Index [WS] Cristie Hem, MD     Additional history obtained: -Additional history obtained from friend -External records from outside source obtained and reviewed including: Chart review including previous notes, labs, imaging, consultation notes   Lab Tests: -I ordered, reviewed, and interpreted labs.   The pertinent results include:   Labs Reviewed  LIPASE, BLOOD - Abnormal; Notable for the following components:      Result Value   Lipase <10 (*)    All other components within normal limits  COMPREHENSIVE METABOLIC PANEL - Abnormal; Notable for the following components:   Potassium 3.4 (*)    CO2 21 (*)    Glucose, Bld 177 (*)    AST 14 (*)    Total Bilirubin 0.2 (*)    All other components within normal limits  CBC - Abnormal; Notable for the following components:   Hemoglobin 10.5 (*)    HCT 34.3 (*)    MCH 25.0 (*)    RDW 17.0 (*)    All other components within normal limits  URINALYSIS, ROUTINE W REFLEX MICROSCOPIC - Abnormal; Notable for the following components:   Protein, ur TRACE (*)    All other components within normal limits  HCG, SERUM, QUALITATIVE      EKG   EKG Interpretation  Date/Time:    Ventricular Rate:    PR Interval:    QRS Duration:   QT Interval:    QTC Calculation:   R Axis:     Text Interpretation:           Imaging Studies ordered: I ordered imaging studies including CT A/P On my interpretation imaging demonstrates no acute process I independently visualized and interpreted imaging. I agree with the  radiologist interpretation   Medicines ordered and prescription drug management: Meds ordered this encounter  Medications   ketorolac (TORADOL) 15 MG/ML injection 15 mg   ondansetron (ZOFRAN) injection 4 mg   lactated ringers bolus 1,000 mL   iohexol (OMNIPAQUE) 300 MG/ML solution 100 mL   ondansetron (ZOFRAN) 4 MG tablet    Sig: Take 1 tablet (4 mg total) by mouth every 8 (eight) hours as needed for nausea or vomiting.    Dispense:  20 tablet    Refill:  0    -I have reviewed the patients home medicines and have made adjustments as needed    Cardiac Monitoring: The patient was maintained on a cardiac monitor.  I personally viewed and interpreted the cardiac monitored which showed an underlying rhythm of: NSR   Reevaluation: After the interventions noted above, I reevaluated the patient and found that they have improved  Co morbidities that complicate the patient evaluation  Past Medical History:  Diagnosis Date   Anemia    Anxiety    Chlamydia 11/13/2021   Constipation, chronic    Depression    Environmental allergies    Ganglion cyst of dorsum of left wrist    RECURRENT   GERD (gastroesophageal reflux disease)    watches diet   Headache    Hypertension    IBS (irritable bowel syndrome)    Lactose intolerance       Dispostion: Discharge    Final Clinical Impression(s) / ED Diagnoses Final diagnoses:  Generalized abdominal pain     This chart was dictated using voice recognition software.  Despite best efforts to proofread,  errors can occur which can  change the documentation meaning.    Cristie Hem, MD 04/16/22 1251

## 2022-04-20 ENCOUNTER — Ambulatory Visit (INDEPENDENT_AMBULATORY_CARE_PROVIDER_SITE_OTHER): Payer: Medicaid Other

## 2022-04-20 ENCOUNTER — Emergency Department (HOSPITAL_BASED_OUTPATIENT_CLINIC_OR_DEPARTMENT_OTHER)
Admission: EM | Admit: 2022-04-20 | Discharge: 2022-04-20 | Disposition: A | Discharge status: Home / Self Care | Payer: Medicaid Other | Attending: Emergency Medicine | Admitting: Emergency Medicine

## 2022-04-20 ENCOUNTER — Other Ambulatory Visit (HOSPITAL_COMMUNITY)
Admission: RE | Admit: 2022-04-20 | Discharge: 2022-04-20 | Disposition: A | Discharge status: Home / Self Care | Payer: Medicaid Other | Source: Ambulatory Visit | Attending: Obstetrics & Gynecology | Admitting: Obstetrics & Gynecology

## 2022-04-20 ENCOUNTER — Encounter (HOSPITAL_BASED_OUTPATIENT_CLINIC_OR_DEPARTMENT_OTHER): Payer: Self-pay

## 2022-04-20 VITALS — BP 115/79 | HR 90 | Wt 233.0 lb

## 2022-04-20 DIAGNOSIS — A749 Chlamydial infection, unspecified: Secondary | ICD-10-CM

## 2022-04-20 DIAGNOSIS — R1084 Generalized abdominal pain: Secondary | ICD-10-CM

## 2022-04-20 LAB — COMPREHENSIVE METABOLIC PANEL
ALT: 18 U/L (ref 0–44)
AST: 15 U/L (ref 15–41)
Albumin: 4 g/dL (ref 3.5–5.0)
Alkaline Phosphatase: 66 U/L (ref 38–126)
Anion gap: 9 (ref 5–15)
BUN: 9 mg/dL (ref 6–20)
CO2: 22 mmol/L (ref 22–32)
Calcium: 9.1 mg/dL (ref 8.9–10.3)
Chloride: 106 mmol/L (ref 98–111)
Creatinine, Ser: 0.71 mg/dL (ref 0.44–1.00)
GFR, Estimated: >60 mL/min (ref >60)
Glucose, Bld: 101 mg/dL — ABNORMAL HIGH (ref 70–99)
Potassium: 3.6 mmol/L (ref 3.5–5.1)
Sodium: 137 mmol/L (ref 135–145)
Total Bilirubin: 0.2 mg/dL — ABNORMAL LOW (ref 0.3–1.2)
Total Protein: 7.2 g/dL (ref 6.5–8.1)

## 2022-04-20 LAB — CBC
HCT: 32.5 % — ABNORMAL LOW (ref 36.0–46.0)
Hemoglobin: 10.3 g/dL — ABNORMAL LOW (ref 12.0–15.0)
MCH: 25.4 pg — ABNORMAL LOW (ref 26.0–34.0)
MCHC: 31.7 g/dL (ref 30.0–36.0)
MCV: 80 fL (ref 80.0–100.0)
Platelets: 262 10*3/uL (ref 150–400)
RBC: 4.06 MIL/uL (ref 3.87–5.11)
RDW: 16.3 % — ABNORMAL HIGH (ref 11.5–15.5)
WBC: 6.7 10*3/uL (ref 4.0–10.5)
nRBC: 0 % (ref 0.0–0.2)

## 2022-04-20 LAB — PREGNANCY, URINE: Preg Test, Ur: NEGATIVE

## 2022-04-20 LAB — URINALYSIS, ROUTINE W REFLEX MICROSCOPIC
Bilirubin Urine: NEGATIVE
Glucose, UA: NEGATIVE mg/dL
Hgb urine dipstick: NEGATIVE
Ketones, ur: NEGATIVE mg/dL
Leukocytes,Ua: NEGATIVE
Nitrite: NEGATIVE
Protein, ur: NEGATIVE mg/dL
Specific Gravity, Urine: 1.012 (ref 1.005–1.030)
pH: 6 (ref 5.0–8.0)

## 2022-04-20 LAB — LIPASE, BLOOD: Lipase: 11 U/L (ref 11–51)

## 2022-04-20 MED ORDER — PROMETHAZINE HCL 25 MG PO TABS
25.0000 mg | ORAL_TABLET | Freq: Four times a day (QID) | ORAL | 0 refills | Status: DC | PRN
Start: 2022-04-20 — End: 2022-09-24

## 2022-04-20 MED ORDER — KETOROLAC TROMETHAMINE 30 MG/ML IJ SOLN
30.0000 mg | Freq: Once | INTRAMUSCULAR | Status: AC
  Administered 2022-04-20: 30 mg via INTRAMUSCULAR
  Filled 2022-04-20: qty 1

## 2022-04-20 MED ORDER — DICYCLOMINE HCL 10 MG PO CAPS
10.0000 mg | ORAL_CAPSULE | Freq: Once | ORAL | Status: AC
  Administered 2022-04-20: 10 mg via ORAL
  Filled 2022-04-20: qty 1

## 2022-04-20 MED ORDER — DICYCLOMINE HCL 20 MG PO TABS: 20.0000 mg | ORAL_TABLET | Freq: Two times a day (BID) | ORAL | 0 refills | Status: DC

## 2022-04-20 NOTE — ED Provider Notes (Signed)
[facility]ENC[facility] DEPT Provider Note   CSN: [id number] Arrival date & time: 04/20/22  0012     History  Chief Complaint  Patient presents with   Abdominal Pain    Jacqueline Gonzalez is a 21 y.o. female.  HPI     This is a 21 year old female who presents with persistent abdominal pain.  Patient has a recent medical history notable for hospital admission for proctocolitis and rectal abscess.  She has followed up with general surgery.  Since that time she reports she is having persistent diffuse abdominal pain worse in the lower abdomen.  She has had nausea and has been taking Zofran at home with minimal relief.  She does have a history of IBS and is no longer taking IBS medication.  She tends to be constipated.  She last had a bowel movement 2 days ago.  She has since been seated evaluated in the surgery clinic area and was diagnosed with a fissure.  She was given symptom measures for her fissure.  She was also seen on 9/28 for abdominal pain in the emergency department.  At that time she had lab work and a CT scan that was unremarkable.  Patient states that she has not been able to follow-up with her GI doctor yet although she has a appointment within the next 2 weeks.  She has had ongoing symptoms despite ibuprofen and Zofran at home.  She denies any vaginal discharge, urinary symptoms.  No new sexual partners.  She states that everything makes her pain worse.  Not specifically associated with food.  Home Medications Prior to Admission medications   Medication Sig Start Date End Date Taking? Authorizing Provider  dicyclomine (BENTYL) 20 MG tablet Take 1 tablet (20 mg total) by mouth 2 (two) times daily. 04/20/22  Yes Jairo Bellew, Barbette Hair, MD  promethazine (PHENERGAN) 25 MG tablet Take 1 tablet (25 mg total) by mouth every 6 (six) hours as needed for nausea or vomiting. 04/20/22  Yes Darrelle Barrell, Barbette Hair, MD  acetaminophen (TYLENOL) 325 MG tablet Take 2 tablets (650 mg total) by  mouth every 6 (six) hours as needed for fever or mild pain. 03/16/22   Raiford Noble Latif, DO  AMBULATORY NON FORMULARY MEDICATION Medication Name: Diltiazem 2% gel -Using your index finger, apply a small amount of medication inside the rectum up to your first knuckle/joint three times daily x 6-8 weeks. 03/03/22   Levin Erp, PA  aspirin-acetaminophen-caffeine (EXCEDRIN MIGRAINE) [phone] MG tablet Take 2 tablets by mouth every 6 (six) hours as needed for headache or migraine.    [provider]  baclofen (LIORESAL) 10 MG tablet Take 10 mg by mouth.    [provider]  cetirizine (ZYRTEC) 10 MG tablet Take 10 mg by mouth daily as needed for allergies.    [provider]  FLUoxetine (PROZAC) 40 MG capsule Take 1 capsule (40 mg total) by mouth daily. Patient taking differently: Take 40 mg by mouth every morning. 12/31/21   Shary Key, DO  fluticasone (FLONASE) 50 MCG/ACT nasal spray Place 2 sprays into both nostrils daily. Patient taking differently: Place 2 sprays into both nostrils daily as needed for allergies. 10/19/21   Evelina Dun A, FNP  hyoscyamine (LEVSIN SL) 0.125 MG SL tablet Place 1 tablet (0.125 mg total) under the tongue every 4 (four) hours as needed. 03/03/22   Levin Erp, PA  lubiprostone (AMITIZA) 8 MCG capsule Take 1 capsule (8 mcg total) by mouth 2 (two) times daily  with a meal. 03/03/22   Lemmon, Lavone Nian, PA  Norethindrone Acetate-Ethinyl Estrad-FE (LOESTRIN 24 FE) 1-20 MG-MCG(24) tablet Take 1 tablet by mouth daily. 03/31/22   Anyanwu, Sallyanne Havers, MD  ondansetron (ZOFRAN) 4 MG tablet Take 1 tablet (4 mg total) by mouth every 8 (eight) hours as needed for nausea or vomiting. 04/16/22   Cristie Hem, MD  oxyCODONE (OXY IR/ROXICODONE) 5 MG immediate release tablet Take 1 tablet (5 mg total) by mouth every 6 (six) hours as needed for moderate pain, severe pain or breakthrough pain. Patient not taking: Reported on  03/31/2022 03/16/22   Raiford Noble Latif, DO  polyethylene glycol (MIRALAX / GLYCOLAX) 17 g packet Take 17 g by mouth every 12 (twelve) hours as needed for mild constipation, moderate constipation or severe constipation. 03/16/22   Raiford Noble Latif, DO  Psyllium (METAMUCIL PO) Take 1 packet by mouth daily as needed (constipation).    [provider]  triamcinolone cream (KENALOG) 0.1 % Apply 1 Application topically 2 (two) times daily as needed (eczema). 02/06/22   [provider]  zonisamide (ZONEGRAN) 25 MG capsule Take 50 mg by mouth 2 (two) times daily. Patient not taking: Reported on 03/10/2022 03/09/22   [provider]      Allergies    Blue dyes (parenteral), Blueberry [vaccinium angustifolium], Raspberry, Shellfish allergy, and Amitriptyline    Review of Systems   Review of Systems  Constitutional:  Negative for fever.  Gastrointestinal:  Positive for abdominal pain and nausea. Negative for vomiting.  Genitourinary:  Negative for dysuria and vaginal discharge.  All other systems reviewed and are negative.   Physical Exam Updated Vital Signs BP 116/73   Pulse 73   Temp 98.7 F (37.1 C) (Temporal)   Resp 18   Ht 1.727 m (5' 8" )   Wt 103.4 kg   SpO2 100%   BMI 34.67 kg/m  Physical Exam Vitals and nursing note reviewed.  Constitutional:      Appearance: She is well-developed. She is obese. She is not ill-appearing.  HENT:     Head: Normocephalic and atraumatic.  Eyes:     Pupils: Pupils are equal, round, and reactive to light.  Cardiovascular:     Rate and Rhythm: Normal rate and regular rhythm.     Heart sounds: Normal heart sounds.  Pulmonary:     Effort: Pulmonary effort is normal. No respiratory distress.     Breath sounds: No wheezing.  Abdominal:     General: Bowel sounds are normal.     Palpations: Abdomen is soft.     Tenderness: There is no abdominal tenderness. There is no guarding or rebound.  Musculoskeletal:     Cervical  back: Neck supple.  Skin:    General: Skin is warm and dry.  Neurological:     General: No focal deficit present.     Mental Status: She is alert and oriented to person, place, and time.  Psychiatric:        Mood and Affect: Mood normal.     ED Results / Procedures / Treatments   Labs (all labs ordered are listed, but only abnormal results are displayed) Labs Reviewed  COMPREHENSIVE METABOLIC PANEL - Abnormal; Notable for the following components:      Result Value   Glucose, Bld 101 (*)    Total Bilirubin 0.2 (*)    All other components within normal limits  CBC - Abnormal; Notable for the following components:   Hemoglobin 10.3 (*)  HCT 32.5 (*)    MCH 25.4 (*)    RDW 16.3 (*)    All other components within normal limits  LIPASE, BLOOD  URINALYSIS, ROUTINE W REFLEX MICROSCOPIC  PREGNANCY, URINE    EKG None  Radiology No results found.  Procedures Procedures    Medications Ordered in ED Medications  dicyclomine (BENTYL) capsule 10 mg (10 mg Oral Given 04/20/22 0259)  ketorolac (TORADOL) 30 MG/ML injection 30 mg (30 mg Intramuscular Given 04/20/22 0259)    ED Course/ Medical Decision Making/ A&P                           Medical Decision Making Amount and/or Complexity of Data Reviewed Labs: ordered.  Risk Prescription drug management.   This patient presents to the ED for concern of ongoing abdominal pain, this involves an extensive number of treatment options, and is a complaint that carries with it a high risk of complications and morbidity.  I considered the following differential and admission for this acute, potentially life threatening condition.  The differential diagnosis includes IBS, gastritis, gastroenteritis, less likely acute pathology such as appendicitis, cholecystitis, SBO  MDM:    This is a 21 year old female who presents with persistent abdominal discomfort.  Was just seen and evaluated 2 days ago for the same and had negative imaging  and lab work at that time.  She is overall nontoxic-appearing and vital signs are reassuring.  She has a nontender abdomen.  Labs obtained and reviewed and again largely reassuring without leukocytosis or metabolic derangement.  LFTs are normal and urinalysis is without evidence of infection.  Discussed with the patient that I do believe her best next step is follow-up with gastroenterology.  Patient was given Bentyl and Toradol.  Will discharge with Bentyl and Phenergan given ongoing symptoms.  (Labs, imaging, consults)  Labs: I Ordered, and personally interpreted labs.  The pertinent results include: CBC, CMP, lipase, urinalysis  Imaging Studies ordered: I ordered imaging studies including none I independently visualized and interpreted imaging. I agree with the radiologist interpretation  Additional history obtained from chart review.  External records from outside source obtained and reviewed including recent admission and follow-up outpatient notes  Cardiac Monitoring: The patient was maintained on a cardiac monitor.  I personally viewed and interpreted the cardiac monitored which showed an underlying rhythm of: Sinus rhythm  Reevaluation: After the interventions noted above, I reevaluated the patient and found that they have :improved  Social Determinants of Health: Lives independently  Disposition: Discharge  Co morbidities that complicate the patient evaluation  Past Medical History:  Diagnosis Date   Anemia    Anxiety    Chlamydia 11/13/2021   Constipation, chronic    Depression    Environmental allergies    Ganglion cyst of dorsum of left wrist    RECURRENT   GERD (gastroesophageal reflux disease)    watches diet   Headache    Hypertension    IBS (irritable bowel syndrome)    Lactose intolerance      Medicines Meds ordered this encounter  Medications   dicyclomine (BENTYL) capsule 10 mg   ketorolac (TORADOL) 30 MG/ML injection 30 mg   dicyclomine (BENTYL)  20 MG tablet    Sig: Take 1 tablet (20 mg total) by mouth 2 (two) times daily.    Dispense:  20 tablet    Refill:  0   promethazine (PHENERGAN) 25 MG tablet    Sig: Take 1  tablet (25 mg total) by mouth every 6 (six) hours as needed for nausea or vomiting.    Dispense:  15 tablet    Refill:  0    I have reviewed the patients home medicines and have made adjustments as needed  Problem List / ED Course: Problem List Items Addressed This Visit   None Visit Diagnoses     Generalized abdominal pain    -  Primary                   Final Clinical Impression(s) / ED Diagnoses Final diagnoses:  Generalized abdominal pain    Rx / DC Orders ED Discharge Orders          Ordered    dicyclomine (BENTYL) 20 MG tablet  2 times daily        04/20/22 0347    promethazine (PHENERGAN) 25 MG tablet  Every 6 hours PRN        04/20/22 0347              Merryl Hacker, MD 04/20/22 [phone]

## 2022-04-20 NOTE — Progress Notes (Signed)
SUBJECTIVE:  21 y.o. female who desires a STI screen. TOC for Chlamydia. Denies abnormal vaginal discharge, bleeding or significant pelvic pain. No UTI symptoms.   No LMP recorded. (Menstrual status: Irregular Periods).  OBJECTIVE:  She appears well.   ASSESSMENT:  STI Screen   PLAN:  Pt offered STI blood screening-not indicated GC, chlamydia, and trichomonas probe sent to lab.  Treatment: To be determined once lab results are received.  Pt follow up as needed.

## 2022-04-20 NOTE — ED Triage Notes (Signed)
Pt states that she has been having lower abd pain/pelvic pain with nausea and diarrhea, denies fevers or discharge, states she was admitted a month ago with rectal abscess and PID

## 2022-04-20 NOTE — Discharge Instructions (Signed)
You were seen today again for your abdominal pain.  Your lab work is still reassuring.  Follow-up closely with your gastroenterologist.

## 2022-04-22 LAB — CERVICOVAGINAL ANCILLARY ONLY
Chlamydia: NEGATIVE
Comment: NEGATIVE
Comment: NEGATIVE
Comment: NORMAL
Neisseria Gonorrhea: NEGATIVE
Trichomonas: NEGATIVE

## 2022-04-30 ENCOUNTER — Encounter: Payer: Self-pay | Admitting: Physician Assistant

## 2022-04-30 ENCOUNTER — Ambulatory Visit (INDEPENDENT_AMBULATORY_CARE_PROVIDER_SITE_OTHER): Payer: Medicaid Other | Admitting: Physician Assistant

## 2022-04-30 ENCOUNTER — Encounter: Payer: Self-pay | Admitting: Internal Medicine

## 2022-04-30 VITALS — BP 108/60 | HR 98 | Ht 68.0 in | Wt 234.1 lb

## 2022-04-30 DIAGNOSIS — K580 Irritable bowel syndrome with diarrhea: Secondary | ICD-10-CM

## 2022-04-30 DIAGNOSIS — K219 Gastro-esophageal reflux disease without esophagitis: Secondary | ICD-10-CM | POA: Diagnosis not present

## 2022-04-30 DIAGNOSIS — K920 Hematemesis: Secondary | ICD-10-CM | POA: Diagnosis not present

## 2022-04-30 DIAGNOSIS — R1084 Generalized abdominal pain: Secondary | ICD-10-CM

## 2022-04-30 DIAGNOSIS — K6289 Other specified diseases of anus and rectum: Secondary | ICD-10-CM

## 2022-04-30 DIAGNOSIS — K602 Anal fissure, unspecified: Secondary | ICD-10-CM

## 2022-04-30 DIAGNOSIS — K921 Melena: Secondary | ICD-10-CM

## 2022-04-30 MED ORDER — OMEPRAZOLE 40 MG PO CPDR: 40.0000 mg | DELAYED_RELEASE_CAPSULE | Freq: Every day | ORAL | 5 refills | Status: DC

## 2022-04-30 MED ORDER — PLENVU 140 G PO SOLR: 1.0000 | Freq: Once | ORAL | 0 refills | Status: AC

## 2022-04-30 MED ORDER — DICYCLOMINE HCL 20 MG PO TABS: 20.0000 mg | ORAL_TABLET | Freq: Three times a day (TID) | ORAL | 1 refills | Status: DC

## 2022-04-30 NOTE — Patient Instructions (Addendum)
_______________________________________________________  If you are age 21 or older, your body mass index should be between 23-30. Your Body mass index is 35.59 kg/m. If this is out of the aforementioned range listed, please consider follow up with your Primary Care Provider.  If you are age 57 or younger, your body mass index should be between 19-25. Your Body mass index is 35.59 kg/m. If this is out of the aformentioned range listed, please consider follow up with your Primary Care Provider.   ________________________________________________________  The [facility] providers would like to encourage you to use [facility] to communicate with providers for non-urgent requests or questions.  Due to long hold times on the telephone, sending your provider a message by [facility] may be a faster and more efficient way to get a response.  Please allow 48 business hours for a response.  Please remember that this is for non-urgent requests.  _______________________________________________________  Jacqueline Gonzalez have been scheduled for an endoscopy and colonoscopy. Please follow the written instructions given to you at your visit today. Please pick up your prep supplies at the pharmacy within the next 1-3 days. If you use inhalers (even only as needed), please bring them with you on the day of your procedure.  We have sent the following medications to your pharmacy for you to pick up at your convenience:  Omeprazole and Dicyclomine (which is increased to three times a day and at bedtime)  Use over the counter Recticare with lidocaine as needed for pain

## 2022-04-30 NOTE — Progress Notes (Signed)
Chief Complaint: Abdominal pain, rectal pain and Diarrhea  HPI:    Jacqueline Gonzalez is a 21 year old African-American female, assigned to Dr. Candis Schatz at last visit, with a past medical history as listed below including reflux, IBS and depression, who was referred to me by Donney Dice, DO for a complaint of abdominal pain, rectal pain and diarrhea.      11/20/2021 pelvic ultrasound with submucosal fibroid versus polyp measuring 11 mm and numerous bilateral ovarian follicles with peripheral orientation, correlate for polycystic ovarian disease and trace fluid in the pelvis.     03/03/2022 patient seen in clinic and described that she was told that she likely had IBS with constipation daily.  She was having a bowel movement every once every 4 days or so which was hard to pass.  Linzess 72 mcg daily gave her terrible diarrhea.  Also describes seeing some bright red blood on the toilet paper mixed with stool.  She was also being worked up for PCOS.  On exam patient had an anal fissure.  She was tried on Amitiza 8 mcg twice a day with food.  Also prescribed Hyoscyamine 1 tab every 4-6 hours.  Prescribed Diltiazem 2% ointment 3 times daily x6 to 8 weeks for fissure.    03/09/2022-03/16/22 admission to the hospital for sepsis secondary to chlamydia PID and chlamydia proctocolitis associated with perirectal abscess.  She was treated with antibiotics Ceftriaxone and Flagyl as well as Doxycycline.  She did undergo I&D of supralevator rectal abscess on 8/26 with moderate proctitis.  Told to continue MiraLAX twice daily.  8/25 MRI of the pelvis without contrast showed progressive rectal wall thickening suspicious for infectious/inflammatory proctocolitis.    04/16/2022 CT the abdomen pelvis with contrast of the ED for ongoing abdominal pain.  This was normal.    Today, the patient presents to clinic and tells me she still having a lot of issues with diarrhea, nausea, headache and vomiting some blood as well as rectal pain  on the outside and now on the inside as well and generalized abdominal discomfort.  This is no different after being restarted on Dicyclomine 20 mg twice a day over the past week.  She is still using Diltiazem for fissure twice a day but does not feel like it is really helping.  Tells me she has followed with the surgical team but does not feel like they are really doing much.  Does describe that 3 days ago she had an episode of vomiting where she saw bright red blood in her vomit and also had some diarrhea with bright red blood in the at the same time.  She is only having 3-4 loose stools a day.  She tells me she cannot work or go to school or even really ride in the car long distances all of her symptoms seem to bother her.    Denies fever, chills or weight loss.  Past Medical History:  Diagnosis Date   Anemia    Anxiety    Chlamydia 11/13/2021   Constipation, chronic    Depression    Environmental allergies    Ganglion cyst of dorsum of left wrist    RECURRENT   GERD (gastroesophageal reflux disease)    watches diet   Headache    Hypertension    IBS (irritable bowel syndrome)    Lactose intolerance     Past Surgical History:  Procedure Laterality Date   GANGLION CYST EXCISION Left 04/24/2021   Procedure: left recurrent dorsal carpal ganglion excision;  Surgeon: [name], MD;  Location: [facility];  Service: Orthopedics;  Laterality: Left;   INCISION AND DRAINAGE PERIRECTAL ABSCESS N/A 03/14/2022   Procedure: IRRIGATION AND DEBRIDEMENT PERIRECTAL ABSCESS;  Surgeon: Michael Boston, MD;  Location: [facility];  Service: General;  Laterality: N/A;   TONSILLECTOMY AND ADENOIDECTOMY     AGE 77   WISDOM TOOTH EXTRACTION  03/18/2021   WRIST GANGLION EXCISION Left 02/2020    Current Outpatient Medications  Medication Sig Dispense Refill   acetaminophen (TYLENOL) 325 MG tablet Take 2 tablets (650 mg total) by mouth every 6 (six) hours as needed for fever or mild pain. 20  tablet 0   AMBULATORY NON FORMULARY MEDICATION Medication Name: Diltiazem 2% gel -Using your index finger, apply a small amount of medication inside the rectum up to your first knuckle/joint three times daily x 6-8 weeks. 30 g 1   aspirin-acetaminophen-caffeine (EXCEDRIN MIGRAINE) 242-353-61 MG tablet Take 2 tablets by mouth every 6 (six) hours as needed for headache or migraine.     baclofen (LIORESAL) 10 MG tablet Take 10 mg by mouth.     cetirizine (ZYRTEC) 10 MG tablet Take 10 mg by mouth daily as needed for allergies.     dicyclomine (BENTYL) 20 MG tablet Take 1 tablet (20 mg total) by mouth 2 (two) times daily. 20 tablet 0   FLUoxetine (PROZAC) 40 MG capsule Take 1 capsule (40 mg total) by mouth daily. (Patient taking differently: Take 40 mg by mouth every morning.) 90 capsule 3   fluticasone (FLONASE) 50 MCG/ACT nasal spray Place 2 sprays into both nostrils daily. (Patient taking differently: Place 2 sprays into both nostrils daily as needed for allergies.) 16 g 6   hyoscyamine (LEVSIN SL) 0.125 MG SL tablet Place 1 tablet (0.125 mg total) under the tongue every 4 (four) hours as needed. 30 tablet 2   lubiprostone (AMITIZA) 8 MCG capsule Take 1 capsule (8 mcg total) by mouth 2 (two) times daily with a meal. 60 capsule 2   Norethindrone Acetate-Ethinyl Estrad-FE (LOESTRIN 24 FE) 1-20 MG-MCG(24) tablet Take 1 tablet by mouth daily. 20 tablet 11   ondansetron (ZOFRAN) 4 MG tablet Take 1 tablet (4 mg total) by mouth every 8 (eight) hours as needed for nausea or vomiting. 20 tablet 0   oxyCODONE (OXY IR/ROXICODONE) 5 MG immediate release tablet Take 1 tablet (5 mg total) by mouth every 6 (six) hours as needed for moderate pain, severe pain or breakthrough pain. 10 tablet 0   polyethylene glycol (MIRALAX / GLYCOLAX) 17 g packet Take 17 g by mouth every 12 (twelve) hours as needed for mild constipation, moderate constipation or severe constipation. 14 each 0   promethazine (PHENERGAN) 25 MG tablet Take  1 tablet (25 mg total) by mouth every 6 (six) hours as needed for nausea or vomiting. 15 tablet 0   Psyllium (METAMUCIL PO) Take 1 packet by mouth daily as needed (constipation).     triamcinolone cream (KENALOG) 0.1 % Apply 1 Application topically 2 (two) times daily as needed (eczema).     zonisamide (ZONEGRAN) 25 MG capsule Take 50 mg by mouth 2 (two) times daily.     No current facility-administered medications for this visit.    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   Shellfish allergy Anaphylaxis 06/17/2016   Amitriptyline Other (See Comments) 03/10/2022  Family History  Problem Relation Age of Onset   Irritable bowel syndrome Mother    Asthma Father    Diabetes Maternal Grandmother    Diabetes Paternal Grandmother    Kidney disease Paternal Alanda Slim' disease Paternal Aunt    Ovarian cancer Maternal Aunt    Stomach cancer Neg Hx    Esophageal cancer Neg Hx    Colon cancer Neg Hx     Social History   Socioeconomic History   Marital status: Single    Spouse name: Not on file   Number of children: 0   Years of education: Not on file   Highest education level: Not on file  Occupational History   Occupation: student  Tobacco Use   Smoking status: Never   Smokeless tobacco: Never  Vaping Use   Vaping Use: Never used  Substance and Sexual Activity   Alcohol use: Never   Drug use: Never   Sexual activity: Yes    Birth control/protection: OCP  Other Topics Concern   Not on file  Social History Narrative   Not on file   Social Determinants of Health   Financial Resource Strain: Not on file  Food Insecurity: Not on file  Transportation Needs: Not on file  Physical Activity: Not on file  Stress: No Stress Concern Present (07/04/2021)   [place]    Feeling of Stress : Only a little  Recent Concern: Stress - Stress Concern Present (06/20/2021)   [place]    Feeling of Stress : To some extent  Social Connections: Not on file  Intimate Partner Violence: Not on file    Review of Systems:    Constitutional: No weight loss, fever or chills Cardiovascular: No chest pain Respiratory: No SOB Gastrointestinal: See HPI and otherwise negative   Physical Exam:  Vital signs: BP 108/60   Pulse 98   Ht 5' 8"  (1.727 m)   Wt 234 lb 1 oz (106.2 kg)   BMI 35.59 kg/m    Constitutional:   Pleasant overweight AA female appears to be in NAD, Well developed, Well nourished, alert and cooperative Respiratory: Respirations even and unlabored. Lungs clear to auscultation bilaterally.   No wheezes, crackles, or rhonchi.  Cardiovascular: Normal S1, S2. No MRG. Regular rate and rhythm. No peripheral edema, cyanosis or pallor.  Gastrointestinal:  Soft, nondistended, nontender. No rebound or guarding. Normal bowel sounds. No appreciable masses or hepatomegaly. Rectal: External: Small posterior fissure, appears slightly improved from previous exam, no fluctuance on buttocks; internal: tenderness to palpation posteriorly Psychiatric: Oriented to person, place and time. Demonstrates good judgement and reason without abnormal affect or behaviors.  RELEVANT LABS AND IMAGING: CBC    Component Value Date/Time   WBC 6.7 04/20/2022 0030   RBC 4.06 04/20/2022 0030   HGB 10.3 (L) 04/20/2022 0030   HGB 9.7 (L) 11/17/2021 1357   HCT 32.5 (L) 04/20/2022 0030   HCT 28.7 (L) 11/17/2021 1357   PLT 262 04/20/2022 0030   PLT 307 11/17/2021 1357   MCV 80.0 04/20/2022 0030   MCV 84 11/17/2021 1357   MCH 25.4 (L) 04/20/2022 0030   MCHC 31.7 04/20/2022 0030   RDW 16.3 (H) 04/20/2022 0030   RDW 12.8 11/17/2021 1357   LYMPHSABS 4.0 03/16/2022  0418   MONOABS 0.4 03/16/2022 0418   EOSABS 0.1 03/16/2022 0418   BASOSABS 0.0 03/16/2022 0418    CMP  Component Value Date/Time   NA 137 04/20/2022 0030   NA 138 08/28/2020 1600   K 3.6 04/20/2022 0030   CL 106 04/20/2022 0030   CO2 22 04/20/2022 0030   GLUCOSE 101 (H) 04/20/2022 0030   BUN 9 04/20/2022 0030   BUN 6 08/28/2020 1600   CREATININE 0.71 04/20/2022 0030   CALCIUM 9.1 04/20/2022 0030   PROT 7.2 04/20/2022 0030   PROT 7.6 08/28/2020 1600   ALBUMIN 4.0 04/20/2022 0030   ALBUMIN 4.3 08/28/2020 1600   AST 15 04/20/2022 0030   ALT 18 04/20/2022 0030   ALKPHOS 66 04/20/2022 0030   BILITOT 0.2 (L) 04/20/2022 0030   BILITOT 0.2 08/28/2020 1600   GFRNONAA >60 04/20/2022 0030   GFRAA 147 08/28/2020 1600    Assessment: 1.  Hematochezia: Continues per patient though rectal fissure seems to be improved at time of exam today; consider underlying IBD 2.  Hematemesis: 1 episode of hematemesis per the patient a couple of weeks ago, also has increasing reflux and heartburn symptoms; consider esophagitis versus Mallory-Weiss tear versus other 3.  Generalized abdominal pain: With below 4.  Rectal pain with known fissure: Also history of abscess recently and hospitalization, no sign of this on recent CT at the end of September; consider underlying IBD 5.  Change in bowel habits: Known to have IBS with constipation currently having more liquid stool typically; consider IBS versus IBD 6.  GERD  Plan: 1.  Patient has had multiple imaging studies, most recently a CT at the end of September which did not show any further abscess or colitis.  Rectal exam was fairly benign with a small posterior fissure still.  Discussed with patient that we need to do an EGD and colonoscopy for further evaluation given all of her symptoms and how extreme they are.  This was scheduled with Dr. [name] in the [facility] if she had availability sooner than Dr. Candis Schatz.  Did provide the patient a detailed list of  risks for the procedures and she agrees to proceed. Patient is appropriate for endoscopic procedure(s) in the ambulatory ([place]) setting.  2.  Recommend the patient buy over-the-counter RectiCare lidocaine and apply as needed for rectal pain 3.  Continue Diltiazem 3 times daily for now 4.  Increase Dicyclomine 20 mg 4 times daily, 20-30 minutes before meals and at bedtime.  #120 with 2 refills. 5.  Started the patient on Omeprazole 40 mg every morning, 30-60 minutes for breakfast.  #30 with 3 refills. 6.  Patient to follow in clinic with [place] per recommendations after time of procedures.  Ellouise Newer, PA-C [facility] 04/30/2022, 10:35 AM  Cc: Donney Dice, DO

## 2022-04-30 NOTE — Progress Notes (Signed)
I agree with the assessment and plan as outlined by Ms. Lemmon.

## 2022-05-04 ENCOUNTER — Ambulatory Visit (AMBULATORY_SURGERY_CENTER): Payer: Medicaid Other | Admitting: Internal Medicine

## 2022-05-04 ENCOUNTER — Encounter: Payer: Self-pay | Admitting: Internal Medicine

## 2022-05-04 VITALS — BP 107/72 | HR 82 | Temp 99.1°F | Resp 16 | Ht 68.0 in | Wt 234.0 lb

## 2022-05-04 DIAGNOSIS — K2970 Gastritis, unspecified, without bleeding: Secondary | ICD-10-CM

## 2022-05-04 DIAGNOSIS — K921 Melena: Secondary | ICD-10-CM | POA: Diagnosis not present

## 2022-05-04 DIAGNOSIS — K602 Anal fissure, unspecified: Secondary | ICD-10-CM | POA: Diagnosis not present

## 2022-05-04 DIAGNOSIS — R109 Unspecified abdominal pain: Secondary | ICD-10-CM

## 2022-05-04 DIAGNOSIS — K2950 Unspecified chronic gastritis without bleeding: Secondary | ICD-10-CM | POA: Diagnosis not present

## 2022-05-04 DIAGNOSIS — K580 Irritable bowel syndrome with diarrhea: Secondary | ICD-10-CM

## 2022-05-04 DIAGNOSIS — K649 Unspecified hemorrhoids: Secondary | ICD-10-CM | POA: Diagnosis not present

## 2022-05-04 DIAGNOSIS — K625 Hemorrhage of anus and rectum: Secondary | ICD-10-CM | POA: Diagnosis not present

## 2022-05-04 DIAGNOSIS — K219 Gastro-esophageal reflux disease without esophagitis: Secondary | ICD-10-CM | POA: Diagnosis not present

## 2022-05-04 MED ORDER — SODIUM CHLORIDE 0.9 % IV SOLN: 500.0000 mL | Freq: Once | INTRAVENOUS | Status: DC

## 2022-05-04 MED ORDER — OMEPRAZOLE 40 MG PO CPDR: 40.0000 mg | DELAYED_RELEASE_CAPSULE | Freq: Two times a day (BID) | ORAL | 3 refills | Status: DC

## 2022-05-04 NOTE — Progress Notes (Signed)
1347  Pt experienced laryngeal spasm with jaw thrust performed. Severe OSA.  Nasopharyngeal airway size 7.0  placed without trauma, vss

## 2022-05-04 NOTE — Progress Notes (Signed)
Called to room to assist during endoscopic procedure.  Patient ID and intended procedure confirmed with present staff. Received instructions for my participation in the procedure from the performing physician.  

## 2022-05-04 NOTE — Patient Instructions (Addendum)
- Await pathology results. - Use Prilosec (omeprazole) 40 mg PO BID for 8 weeks. - Perform a colonoscopy today. - Discharge patient to home (with escort). - The findings and recommendations were discussed with the patient. - Return to GI clinic with Dr. Candis Schatz (primary GI physician) or APP in 4 weeks.  Handouts on hemorrhoids and Gastritis given.  YOU HAD AN ENDOSCOPIC PROCEDURE TODAY AT [facility]:   Refer to the procedure report that was given to you for any specific questions about what was found during the examination.  If the procedure report does not answer your questions, please call your gastroenterologist to clarify.  If you requested that your care partner not be given the details of your procedure findings, then the procedure report has been included in a sealed envelope for you to review at your convenience later.  YOU SHOULD EXPECT: Some feelings of bloating in the abdomen. Passage of more gas than usual.  Walking can help get rid of the air that was put into your GI tract during the procedure and reduce the bloating. If you had a lower endoscopy (such as a colonoscopy or flexible sigmoidoscopy) you may notice spotting of blood in your stool or on the toilet paper. If you underwent a bowel prep for your procedure, you may not have a normal bowel movement for a few days.  Please Note:  You might notice some irritation and congestion in your nose or some drainage.  This is from the oxygen used during your procedure.  There is no need for concern and it should clear up in a day or so.  SYMPTOMS TO REPORT IMMEDIATELY:  Following lower endoscopy (colonoscopy or flexible sigmoidoscopy):  Excessive amounts of blood in the stool  Significant tenderness or worsening of abdominal pains  Swelling of the abdomen that is new, acute  Fever of 100F or higher  Following upper endoscopy (EGD)  Vomiting of blood or coffee ground material  New chest pain or pain under the  shoulder blades  Painful or persistently difficult swallowing  New shortness of breath  Black, tarry-looking stools  For urgent or emergent issues, a gastroenterologist can be reached at any hour by calling [phone]. Do not use MyChart messaging for urgent concerns.    DIET:  We do recommend a small meal at first, but then you may proceed to your regular diet.  Drink plenty of fluids but you should avoid alcoholic beverages for 24 hours.  ACTIVITY:  You should plan to take it easy for the rest of today and you should NOT DRIVE or use heavy machinery until tomorrow (because of the sedation medicines used during the test).    FOLLOW UP: Our staff will call the number listed on your records the next business day following your procedure.  We will call around 7:15- 8:00 am to check on you and address any questions or concerns that you may have regarding the information given to you following your procedure. If we do not reach you, we will leave a message.     If any biopsies were taken you will be contacted by phone or by letter within the next 1-3 weeks.  Please call us at [phone] if you have not heard about the biopsies in 3 weeks.    SIGNATURES/CONFIDENTIALITY: You and/or your care partner have signed paperwork which will be entered into your electronic medical record.  These signatures attest to the fact that that the information above on your After  Visit Summary has been reviewed and is understood.  Full responsibility of the confidentiality of this discharge information lies with you and/or your care-partner.

## 2022-05-04 NOTE — Progress Notes (Signed)
Report given to PACU, vss 

## 2022-05-04 NOTE — Progress Notes (Signed)
VS completed by [place].  Pt's states no medical or surgical changes since previsit or office visit.

## 2022-05-04 NOTE — Progress Notes (Signed)
1350 HR > 100 with esmolol 25 mg given IV.  vss

## 2022-05-04 NOTE — Progress Notes (Signed)
1315 Robinul 0.1 mg IV given due large amount of secretions upon assessment.  MD made aware, vss

## 2022-05-04 NOTE — Progress Notes (Signed)
GASTROENTEROLOGY PROCEDURE H&P NOTE   Primary Care Physician: Donney Dice, DO    Reason for Procedure:   Hematochezia, hematemesis, generalized ab pain, rectal pain with fissure, change in bowel habits  Plan:    EGD/colonoscopy  Patient is appropriate for endoscopic procedure(s) in the ambulatory ([place]) setting.  The nature of the procedure, as well as the risks, benefits, and alternatives were carefully and thoroughly reviewed with the patient. Ample time for discussion and questions allowed. The patient understood, was satisfied, and agreed to proceed.     HPI: Jacqueline Gonzalez is a 21 y.o. female who presents for EGD/colonoscopy for evaluation of hematochezia, hematemesis, generalized ab pain, rectal pain with fissure, changes in bowel habits.  Patient was most recently seen in the Gastroenterology Clinic on 04/30/22.  No interval change in medical history since that appointment. Please refer to that note for full details regarding GI history and clinical presentation.   Past Medical History:  Diagnosis Date   Anemia    Anxiety    Chlamydia 11/13/2021   Constipation, chronic    Depression    Environmental allergies    Ganglion cyst of dorsum of left wrist    RECURRENT   GERD (gastroesophageal reflux disease)    watches diet   Headache    Hypertension    IBS (irritable bowel syndrome)    Lactose intolerance     Past Surgical History:  Procedure Laterality Date   GANGLION CYST EXCISION Left 04/24/2021   Procedure: left recurrent dorsal carpal ganglion excision;  Surgeon: [name], MD;  Location: [facility];  Service: Orthopedics;  Laterality: Left;   INCISION AND DRAINAGE PERIRECTAL ABSCESS N/A 03/14/2022   Procedure: IRRIGATION AND DEBRIDEMENT PERIRECTAL ABSCESS;  Surgeon: Michael Boston, MD;  Location: [facility];  Service: General;  Laterality: N/A;   TONSILLECTOMY AND ADENOIDECTOMY     AGE 21   WISDOM TOOTH EXTRACTION  03/18/2021   WRIST GANGLION  EXCISION Left 02/2020    Prior to Admission medications   Medication Sig Start Date End Date Taking? Authorizing Provider  acetaminophen (TYLENOL) 325 MG tablet Take 2 tablets (650 mg total) by mouth every 6 (six) hours as needed for fever or mild pain. 03/16/22   Raiford Noble Latif, DO  AMBULATORY NON FORMULARY MEDICATION Medication Name: Diltiazem 2% gel -Using your index finger, apply a small amount of medication inside the rectum up to your first knuckle/joint three times daily x 6-8 weeks. 03/03/22   Levin Erp, PA  aspirin-acetaminophen-caffeine (EXCEDRIN MIGRAINE) [phone] MG tablet Take 2 tablets by mouth every 6 (six) hours as needed for headache or migraine.    [provider]  baclofen (LIORESAL) 10 MG tablet Take 10 mg by mouth.    [provider]  cetirizine (ZYRTEC) 10 MG tablet Take 10 mg by mouth daily as needed for allergies.    [provider]  dicyclomine (BENTYL) 20 MG tablet Take 1 tablet (20 mg total) by mouth 4 (four) times daily -  before meals and at bedtime. 04/30/22   Levin Erp, PA  FLUoxetine (PROZAC) 40 MG capsule Take 1 capsule (40 mg total) by mouth daily. Patient taking differently: Take 40 mg by mouth every morning. 12/31/21   Shary Key, DO  fluticasone (FLONASE) 50 MCG/ACT nasal spray Place 2 sprays into both nostrils daily. Patient taking differently: Place 2 sprays into both nostrils daily as needed for allergies. 10/19/21   Evelina Dun A, FNP  hyoscyamine (LEVSIN SL) 0.125 MG  SL tablet Place 1 tablet (0.125 mg total) under the tongue every 4 (four) hours as needed. 03/03/22   Levin Erp, PA  lubiprostone (AMITIZA) 8 MCG capsule Take 1 capsule (8 mcg total) by mouth 2 (two) times daily with a meal. 03/03/22   Lemmon, Lavone Nian, PA  Norethindrone Acetate-Ethinyl Estrad-FE (LOESTRIN 24 FE) 1-20 MG-MCG(24) tablet Take 1 tablet by mouth daily. 03/31/22   Anyanwu, Sallyanne Havers, MD  omeprazole  (PRILOSEC) 40 MG capsule Take 1 capsule (40 mg total) by mouth daily. 04/30/22   Levin Erp, PA  ondansetron (ZOFRAN) 4 MG tablet Take 1 tablet (4 mg total) by mouth every 8 (eight) hours as needed for nausea or vomiting. 04/16/22   Cristie Hem, MD  oxyCODONE (OXY IR/ROXICODONE) 5 MG immediate release tablet Take 1 tablet (5 mg total) by mouth every 6 (six) hours as needed for moderate pain, severe pain or breakthrough pain. 03/16/22   Raiford Noble Latif, DO  polyethylene glycol (MIRALAX / GLYCOLAX) 17 g packet Take 17 g by mouth every 12 (twelve) hours as needed for mild constipation, moderate constipation or severe constipation. 03/16/22   Raiford Noble Latif, DO  promethazine (PHENERGAN) 25 MG tablet Take 1 tablet (25 mg total) by mouth every 6 (six) hours as needed for nausea or vomiting. 04/20/22   Horton, Barbette Hair, MD  Psyllium (METAMUCIL PO) Take 1 packet by mouth daily as needed (constipation).    [provider]  triamcinolone cream (KENALOG) 0.1 % Apply 1 Application topically 2 (two) times daily as needed (eczema). 02/06/22   [provider]  zonisamide (ZONEGRAN) 25 MG capsule Take 50 mg by mouth 2 (two) times daily. 03/09/22   [provider]    Current Outpatient Medications  Medication Sig Dispense Refill   acetaminophen (TYLENOL) 325 MG tablet Take 2 tablets (650 mg total) by mouth every 6 (six) hours as needed for fever or mild pain. 20 tablet 0   AMBULATORY NON FORMULARY MEDICATION Medication Name: Diltiazem 2% gel -Using your index finger, apply a small amount of medication inside the rectum up to your first knuckle/joint three times daily x 6-8 weeks. 30 g 1   aspirin-acetaminophen-caffeine (EXCEDRIN MIGRAINE) [phone] MG tablet Take 2 tablets by mouth every 6 (six) hours as needed for headache or migraine.     baclofen (LIORESAL) 10 MG tablet Take 10 mg by mouth.     cetirizine (ZYRTEC) 10 MG tablet Take 10 mg by mouth daily as needed  for allergies.     dicyclomine (BENTYL) 20 MG tablet Take 1 tablet (20 mg total) by mouth 4 (four) times daily -  before meals and at bedtime. 120 tablet 1   FLUoxetine (PROZAC) 40 MG capsule Take 1 capsule (40 mg total) by mouth daily. (Patient taking differently: Take 40 mg by mouth every morning.) 90 capsule 3   fluticasone (FLONASE) 50 MCG/ACT nasal spray Place 2 sprays into both nostrils daily. (Patient taking differently: Place 2 sprays into both nostrils daily as needed for allergies.) 16 g 6   hyoscyamine (LEVSIN SL) 0.125 MG SL tablet Place 1 tablet (0.125 mg total) under the tongue every 4 (four) hours as needed. 30 tablet 2   lubiprostone (AMITIZA) 8 MCG capsule Take 1 capsule (8 mcg total) by mouth 2 (two) times daily with a meal. 60 capsule 2   Norethindrone Acetate-Ethinyl Estrad-FE (LOESTRIN 24 FE) 1-20 MG-MCG(24) tablet Take 1 tablet by mouth daily. 20 tablet 11   omeprazole (PRILOSEC) 40  MG capsule Take 1 capsule (40 mg total) by mouth daily. 30 capsule 5   ondansetron (ZOFRAN) 4 MG tablet Take 1 tablet (4 mg total) by mouth every 8 (eight) hours as needed for nausea or vomiting. 20 tablet 0   oxyCODONE (OXY IR/ROXICODONE) 5 MG immediate release tablet Take 1 tablet (5 mg total) by mouth every 6 (six) hours as needed for moderate pain, severe pain or breakthrough pain. 10 tablet 0   polyethylene glycol (MIRALAX / GLYCOLAX) 17 g packet Take 17 g by mouth every 12 (twelve) hours as needed for mild constipation, moderate constipation or severe constipation. 14 each 0   promethazine (PHENERGAN) 25 MG tablet Take 1 tablet (25 mg total) by mouth every 6 (six) hours as needed for nausea or vomiting. 15 tablet 0   Psyllium (METAMUCIL PO) Take 1 packet by mouth daily as needed (constipation).     triamcinolone cream (KENALOG) 0.1 % Apply 1 Application topically 2 (two) times daily as needed (eczema).     zonisamide (ZONEGRAN) 25 MG capsule Take 50 mg by mouth 2 (two) times daily.     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   Shellfish allergy Anaphylaxis 06/17/2016   Amitriptyline Other (See Comments) 03/10/2022    Family History  Problem Relation Age of Onset   Irritable bowel syndrome Mother    Asthma Father    Diabetes Maternal Grandmother    Diabetes Paternal Grandmother    Kidney disease Paternal Alanda Slim' disease Paternal Aunt    Ovarian cancer Maternal Aunt    Stomach cancer Neg Hx    Esophageal cancer Neg Hx    Colon cancer Neg Hx     Social History   Socioeconomic History   Marital status: Single    Spouse name: Not on file   Number of children: 0   Years of education: Not on file   Highest education level: Not on file  Occupational History   Occupation: student  Tobacco Use   Smoking status: Never   Smokeless tobacco: Never  Vaping Use   Vaping Use: Never used  Substance and Sexual Activity   Alcohol use: Never   Drug use: Never   Sexual activity: Yes    Birth control/protection: OCP  Other Topics Concern   Not on file  Social History Narrative   Not on file   Social Determinants of Health   Financial Resource Strain: Not on file  Food Insecurity: Not on file  Transportation Needs: Not on file  Physical Activity: Not on file  Stress: No Stress Concern Present (07/04/2021)   [place] of Stress : Only a little  Recent Concern: Stress - Stress Concern Present (06/20/2021)   [place]    Feeling of Stress : To  some extent  Social Connections: Not on file  Intimate Partner Violence: Not on file    Physical Exam: Vital signs in last 24 hours: BP (!) 136/102   Pulse 97   Temp 99.1 F (37.3 C) (Temporal)  Ht 5' 8"  (1.727 m)   Wt 234 lb (106.1 kg)   SpO2 97%   BMI 35.58 kg/m  GEN: NAD EYE: Sclerae anicteric ENT: MMM CV: Non-tachycardic Pulm: No increased WOB GI: Soft NEURO:  Alert & Oriented   [name], MD [facility]   05/04/2022 12:57 PM

## 2022-05-04 NOTE — Op Note (Signed)
[facility] Patient Name: Jacqueline Gonzalez Procedure Date: 05/04/2022 1:15 PM MRN: [id number] Endoscopist: Sonny Masters "Jacqueline Gonzalez ,  Age: 21 Referring MD:  Date of Birth: 12-21-2000 Gender: Female Account #: [id number] Procedure:                Upper GI endoscopy Indications:              Generalized abdominal pain, Hematemesis Medicines:                Monitored Anesthesia Care Procedure:                Pre-Anesthesia Assessment:                           - Prior to the procedure, a History and Physical                            was performed, and patient medications and                            allergies were reviewed. The patient's tolerance of                            previous anesthesia was also reviewed. The risks                            and benefits of the procedure and the sedation                            options and risks were discussed with the patient.                            All questions were answered, and informed consent                            was obtained. Prior Anticoagulants: The patient has                            taken no previous anticoagulant or antiplatelet                            agents. ASA Grade Assessment: II - A patient with                            mild systemic disease. After reviewing the risks                            and benefits, the patient was deemed in                            satisfactory condition to undergo the procedure.                           After obtaining informed consent, the endoscope was  passed under direct vision. Throughout the                            procedure, the patient's blood pressure, pulse, and                            oxygen saturations were monitored continuously. The                            Endoscope was introduced through the mouth, and                            advanced to the second part of duodenum. The upper                            GI  endoscopy was accomplished without difficulty.                            The patient tolerated the procedure well. Scope In: Scope Out: Findings:                 The examined esophagus was normal. Biopsies were                            taken with a cold forceps for histology.                           Localized inflammation characterized by congestion                            (edema), erosions and erythema was found in the                            gastric body and in the gastric antrum. Biopsies                            were taken with a cold forceps for histology.                           The examined duodenum was normal. Biopsies for                            histology were taken with a cold forceps for                            evaluation of celiac disease. Complications:            No immediate complications. Estimated Blood Loss:     Estimated blood loss was minimal. Impression:               - Normal esophagus. Biopsied.                           - Gastritis. Biopsied.                           -  Normal examined duodenum. Biopsied. Recommendation:           - Await pathology results.                           - Use Prilosec (omeprazole) 40 mg PO BID for 8                            weeks.                           - Perform a colonoscopy today. Dr Georgian Co "Jacqueline Gonzalez" [name],  05/04/2022 2:14:05 PM

## 2022-05-04 NOTE — Op Note (Signed)
[facility] Patient Name: Jacqueline Gonzalez Procedure Date: 05/04/2022 1:14 PM MRN: [id number] Endoscopist: Sonny Masters "[name] ,  Age: 21 Referring MD:  Date of Birth: February 11, 2001 Gender: Female Account #: [id number] Procedure:                Colonoscopy Indications:              Hematochezia, rectal pain Medicines:                Monitored Anesthesia Care Procedure:                Pre-Anesthesia Assessment:                           - Prior to the procedure, a History and Physical                            was performed, and patient medications and                            allergies were reviewed. The patient's tolerance of                            previous anesthesia was also reviewed. The risks                            and benefits of the procedure and the sedation                            options and risks were discussed with the patient.                            All questions were answered, and informed consent                            was obtained. Prior Anticoagulants: The patient has                            taken no previous anticoagulant or antiplatelet                            agents. ASA Grade Assessment: II - A patient with                            mild systemic disease. After reviewing the risks                            and benefits, the patient was deemed in                            satisfactory condition to undergo the procedure.                           After obtaining informed consent, the colonoscope  was passed under direct vision. Throughout the                            procedure, the patient's blood pressure, pulse, and                            oxygen saturations were monitored continuously. The                            Olympus PCF-[id number] (#[id number]) Colonoscope was                            introduced through the anus and advanced to the the                            terminal ileum. The  colonoscopy was performed                            without difficulty. The patient tolerated the                            procedure well. The quality of the bowel                            preparation was good. The terminal ileum, ileocecal                            valve, appendiceal orifice, and rectum were                            photographed. Scope In: 1:46:44 PM Scope Out: 2:07:59 PM Scope Withdrawal Time: 0 hours 16 minutes 41 seconds  Total Procedure Duration: 0 hours 21 minutes 15 seconds  Findings:                 The terminal ileum appeared normal.                           Biopsies for histology were taken with a cold                            forceps from the entire colon for evaluation of                            microscopic colitis.                           Non-bleeding internal hemorrhoids were found during                            retroflexion.                           A small anal fissure was found on perianal exam. Complications:            No immediate complications. Estimated Blood Loss:  Estimated blood loss was minimal. Impression:               - The examined portion of the ileum was normal.                           - Biopsies were taken with a cold forceps from the                            entire colon for evaluation of microscopic colitis.                           - Non-bleeding internal hemorrhoids.                           - Small anal fissure found on perianal exam. Recommendation:           - Discharge patient to home (with escort).                           - Await pathology results.                           - The findings and recommendations were discussed                            with the patient.                           - Return to GI clinic with Dr. Candis Schatz (primary                            GI physician) or APP in 4 weeks. Dr Georgian Co "Lyndee Leo" [name],  05/04/2022 2:21:10 PM

## 2022-05-05 ENCOUNTER — Telehealth: Payer: Self-pay

## 2022-05-05 NOTE — Telephone Encounter (Signed)
Left message on follow up call. 

## 2022-05-07 ENCOUNTER — Encounter: Payer: Self-pay | Admitting: Internal Medicine

## 2022-05-09 ENCOUNTER — Other Ambulatory Visit: Payer: Self-pay | Admitting: Family Medicine

## 2022-05-09 DIAGNOSIS — D649 Anemia, unspecified: Secondary | ICD-10-CM

## 2022-05-13 ENCOUNTER — Telehealth: Payer: Self-pay | Admitting: Internal Medicine

## 2022-05-13 NOTE — Telephone Encounter (Signed)
Patient is requesting results from colonoscopy. Please advise.

## 2022-05-15 ENCOUNTER — Telehealth (HOSPITAL_COMMUNITY): Payer: Medicaid Other | Admitting: Physician Assistant

## 2022-05-15 NOTE — Telephone Encounter (Signed)
Advised of the results sent to her through the mail. Instructed on where to find the result letter in My Chart. Read the recommendation in the procedure report for a 4 week follow up. Declines my offer to assist with making the appointment. She is a patient of Dr Candis Schatz.

## 2022-06-04 ENCOUNTER — Other Ambulatory Visit: Payer: Self-pay

## 2022-06-04 ENCOUNTER — Encounter (HOSPITAL_BASED_OUTPATIENT_CLINIC_OR_DEPARTMENT_OTHER): Payer: Self-pay | Admitting: Emergency Medicine

## 2022-06-04 ENCOUNTER — Emergency Department (HOSPITAL_BASED_OUTPATIENT_CLINIC_OR_DEPARTMENT_OTHER)
Admission: EM | Admit: 2022-06-04 | Discharge: 2022-06-04 | Disposition: A | Discharge status: Home / Self Care | Payer: Medicaid Other | Attending: Emergency Medicine | Admitting: Emergency Medicine

## 2022-06-04 DIAGNOSIS — Z7982 Long term (current) use of aspirin: Secondary | ICD-10-CM | POA: Diagnosis not present

## 2022-06-04 DIAGNOSIS — M5442 Lumbago with sciatica, left side: Secondary | ICD-10-CM | POA: Diagnosis not present

## 2022-06-04 DIAGNOSIS — M5441 Lumbago with sciatica, right side: Secondary | ICD-10-CM | POA: Insufficient documentation

## 2022-06-04 MED ORDER — METHOCARBAMOL 500 MG PO TABS: 1000.0000 mg | ORAL_TABLET | Freq: Four times a day (QID) | ORAL | 0 refills | Status: AC

## 2022-06-04 MED ORDER — IBUPROFEN 800 MG PO TABS: 800.0000 mg | ORAL_TABLET | Freq: Three times a day (TID) | ORAL | 0 refills | Status: DC

## 2022-06-04 NOTE — ED Provider Notes (Signed)
[place] EMERGENCY DEPT Provider Note   CSN: [id number] Arrival date & time: 06/04/22  6283     History  Chief Complaint  Patient presents with   Leg Pain    Jacqueline Gonzalez is a 21 y.o. female with x2 week history of low back and leg pain. Patient reports that pain has been persistent during this time and describes difficulty getting comfortable in any position. Describes pain in back and legs as radiating from around the hip with numbness along back of leg until about knee level. Denies loss of bowel or bladder function, weakness, or loss of motor function. Not currently taking anything to manage.   Leg Pain Associated symptoms: back pain   Associated symptoms: no fatigue and no fever       Home Medications Prior to Admission medications   Medication Sig Start Date End Date Taking? Authorizing Provider  acetaminophen (TYLENOL) 325 MG tablet Take 2 tablets (650 mg total) by mouth every 6 (six) hours as needed for fever or mild pain. 03/16/22   Raiford Noble Latif, DO  AMBULATORY NON FORMULARY MEDICATION Medication Name: Diltiazem 2% gel -Using your index finger, apply a small amount of medication inside the rectum up to your first knuckle/joint three times daily x 6-8 weeks. 03/03/22   Levin Erp, PA  aspirin-acetaminophen-caffeine (EXCEDRIN MIGRAINE) [phone] MG tablet Take 2 tablets by mouth every 6 (six) hours as needed for headache or migraine.    [provider]  baclofen (LIORESAL) 10 MG tablet Take 10 mg by mouth.    [provider]  cetirizine (ZYRTEC) 10 MG tablet Take 10 mg by mouth daily as needed for allergies.    [provider]  dicyclomine (BENTYL) 20 MG tablet Take 1 tablet (20 mg total) by mouth 4 (four) times daily -  before meals and at bedtime. 04/30/22   Levin Erp, PA  FLUoxetine (PROZAC) 40 MG capsule Take 1 capsule (40 mg total) by mouth daily. Patient taking differently: Take 40 mg by  mouth every morning. 12/31/21   Shary Key, DO  fluticasone (FLONASE) 50 MCG/ACT nasal spray Place 2 sprays into both nostrils daily. Patient taking differently: Place 2 sprays into both nostrils daily as needed for allergies. 10/19/21   Evelina Dun A, FNP  hyoscyamine (LEVSIN SL) 0.125 MG SL tablet Place 1 tablet (0.125 mg total) under the tongue every 4 (four) hours as needed. Patient not taking: Reported on 05/04/2022 03/03/22   Levin Erp, PA  lubiprostone (AMITIZA) 8 MCG capsule Take 1 capsule (8 mcg total) by mouth 2 (two) times daily with a meal. 03/03/22   Lemmon, Lavone Nian, PA  Norethindrone Acetate-Ethinyl Estrad-FE (LOESTRIN 24 FE) 1-20 MG-MCG(24) tablet Take 1 tablet by mouth daily. 03/31/22   Anyanwu, Sallyanne Havers, MD  omeprazole (PRILOSEC) 40 MG capsule Take 1 capsule (40 mg total) by mouth daily. 04/30/22   Levin Erp, PA  omeprazole (PRILOSEC) 40 MG capsule Take 1 capsule (40 mg total) by mouth 2 (two) times daily. 05/04/22   Sharyn Creamer, MD  ondansetron (ZOFRAN) 4 MG tablet Take 1 tablet (4 mg total) by mouth every 8 (eight) hours as needed for nausea or vomiting. 04/16/22   Cristie Hem, MD  oxyCODONE (OXY IR/ROXICODONE) 5 MG immediate release tablet Take 1 tablet (5 mg total) by mouth every 6 (six) hours as needed for moderate pain, severe pain or breakthrough pain. 03/16/22   Raiford Noble Latif, DO  polyethylene glycol [facility] /  GLYCOLAX) 17 g packet Take 17 g by mouth every 12 (twelve) hours as needed for mild constipation, moderate constipation or severe constipation. 03/16/22   Raiford Noble Latif, DO  promethazine (PHENERGAN) 25 MG tablet Take 1 tablet (25 mg total) by mouth every 6 (six) hours as needed for nausea or vomiting. 04/20/22   Horton, Barbette Hair, MD  Psyllium (METAMUCIL PO) Take 1 packet by mouth daily as needed (constipation).    [provider]  triamcinolone cream (KENALOG) 0.1 % Apply 1 Application topically 2 (two)  times daily as needed (eczema). 02/06/22   [provider]  zonisamide (ZONEGRAN) 25 MG capsule Take 50 mg by mouth 2 (two) times daily. 03/09/22   [provider]      Allergies    Blue dyes (parenteral), Blueberry [vaccinium angustifolium], Raspberry, Shellfish allergy, and Amitriptyline    Review of Systems   Review of Systems  Constitutional:  Negative for chills, fatigue and fever.  Respiratory:  Negative for chest tightness and shortness of breath.   Cardiovascular:  Negative for chest pain.  Gastrointestinal:  Positive for nausea. Negative for abdominal pain, constipation, diarrhea and vomiting.  Endocrine: Negative for polyuria.  Genitourinary:  Negative for dysuria and flank pain.  Musculoskeletal:  Positive for back pain.    Physical Exam Updated Vital Signs BP 126/86 (BP Location: Right Arm)   Pulse 100   Temp 98.4 F (36.9 C)   Resp 16   Ht 5' 8"  (1.727 m)   Wt 100.7 kg   SpO2 100%   BMI 33.75 kg/m  Physical Exam Constitutional:      Appearance: Normal appearance.  Eyes:     Extraocular Movements: Extraocular movements intact.     Pupils: Pupils are equal, round, and reactive to light.  Cardiovascular:     Rate and Rhythm: Normal rate and regular rhythm.  Pulmonary:     Effort: Pulmonary effort is normal.     Breath sounds: Normal breath sounds.  Abdominal:     General: Abdomen is flat. Bowel sounds are normal.  Musculoskeletal:     Right hip: No deformity. Normal strength.     Left hip: No deformity. Normal strength.     Right lower leg: No deformity or tenderness.     Left lower leg: No deformity or tenderness.     Comments: Positive straight leg raise bilaterally. Limited ROM in bilateral hips due to pain.  Skin:    General: Skin is warm and dry.  Neurological:     Mental Status: She is alert.     ED Results / Procedures / Treatments   Labs (all labs ordered are listed, but only abnormal results are displayed) Labs Reviewed -  No data to display  EKG None  Radiology No results found.  Procedures Procedures   Medications Ordered in ED Medications - No data to display  ED Course/ Medical Decision Making/ A&P                           Medical Decision Making [name] is a 21 yo female with x2 week history of low back pain which is most consistent with sciatica. Pain is described as radiating to posterior thigh without loss of strength or motor function. Patient has not tried any medications consistently for back pain.   Differential includes: cauda equina syndrome which is unlikely due to absence of saddle paraesthesia and presence of bowel/bladder control, pelvic inflammatory disease but recent TOC  and no new sexual partners makes this unlikely, spinal abscess unlikely due to no IV drug use, and muscle strain.  Encouraged rest and management with ibuprofen and methocarbamol. Patient verbalized understanding return precautions.   Final diagnoses:  None    Rx / DC Orders ED Discharge Orders     None         Luvenia Heller, PA-C 06/04/22 0950    Fransico Meadow, MD 06/05/22 [phone]

## 2022-06-04 NOTE — ED Notes (Signed)
Discharge instructions, follow up care, and prescriptions reviewed and explained, pt verbalized understanding. Offered pt ice pack or heat pack but she declined. Pt caox4 and ambulatory on d/c.

## 2022-06-04 NOTE — Discharge Instructions (Addendum)
Manage symptoms at home with ibuprofen and methocarbamol. Return to ER if symptoms worsen. Follow-up with PCP as needed.

## 2022-06-04 NOTE — ED Triage Notes (Signed)
Pt arrives to ED with c/o bilateral leg pain x1 week.

## 2022-06-05 NOTE — Progress Notes (Unsigned)
NEUROLOGY CONSULTATION NOTE  Jacqueline Gonzalez MRN: [id number] DOB: 03-19-2001  Referring provider: Chrisandra Netters, MD Primary care provider: Donney Dice, DO  Reason for consult:  headache  Assessment/Plan:   Chronic migraine without aura, without status migrainosus, intractable Chronic pain syndrome  Migraine prevention:  Start Aimovig 148m every 28 days.  Stop zonisamide Migraine rescue:  She will try samples of Ubrelvy 106m  Cannot take triptans due to prior side effects (dyspnea).  May still use baclofen.  Stop Excedrin Limit use of pain relievers to no more than 2 days out of week to prevent risk of rebound or medication-overuse headache. Keep headache diary Follow up 4-5 months.    Subjective:  Jacqueline Gonzalez a [age]ear old right-handed female with HTN, IBS, chronic constipation, anxiety and depression who presents for headache.  History supplemented by ED and primary care notes.   Onset:  2017.  Became daily early 2023 (been suffering from a pain syndrome- back pain, leg pain, abdominal pain) Location:  occipital moving to front, unilateral face, diffuse Quality:  ice pick to back of head; squeezing, aching, throbbing Intensity:  severe Aura:  absent Prodrome:  absent Associated symptoms:  nausea, sometimes vomiting, sometimes photophobia, sometimes dizziness.  She denies associated phonophobia, visual disturbance, unilateral numbness or weakness. Duration:  persistent Frequency:  daily/persistent (15 days are severe) Frequency of abortive medication: Baclofen no more than 2 days a week; takes Excedrin daily Triggers:  unknown Relieving factors:  nothing Activity:  cannot function about 15 days out of month  Past NSAIDS/analgesics:  acetaminophen, ibuprofen, naproxen, ketorolac, meloxicam Past abortive triptans:  sumatriptan tab (shortness of breath, dizziness, nausea) Past abortive ergotamine:  none Past muscle relaxants:  Flexeril Past anti-emetic:   Zofran Past antihypertensive medications:  none Past antidepressant medications:  amitriptyline 1053mHS (allergy), duloxetine Past anticonvulsant medications:  gabapentin Past anti-CGRP:  none Past vitamins/Herbal/Supplements:  none Past antihistamines/decongestants:  none Other past therapies:  trigger point injections  Rescue protocol:  Excedrin, also baclofen if severe; promethazine for nausea Current NSAIDS/analgesics:  Excedrin Current triptans:  none Current ergotamine:  none Current anti-emetic:  promethazine 46m38murrent muscle relaxants:  baclofen 10mg14m Current Antihypertensive medications:  none Current Antidepressant medications:  fluoxetine 40mg 46my Current Anticonvulsant medications:  zonisamide 46mg Q32murrent anti-CGRP:  none Current Vitamins/Herbal/Supplements:  npne Current Antihistamines/Decongestants:  Zyrtec Other therapy:  trigger point injections/nerve blocks Birth control:  none    Caffeine:  none Diet:  does not drink much water.  Skips meals Exercise:  no Depression:  yes; Anxiety:  yes Other pain:  chronic back pain but over past year reports extremity pain, abdominal pain.  Numbness and tingling.  She had recent NCV-EMG which was unremarkable. Lumbar X-ray from 05/02/2021 unremarkable.   Sleep hygiene:  poor.  Difficulty falling asleep and staying asleep Family history of headache:  mom (migraines), cousin (basilar migraine - passes out), uncle (cluster headache)   PAST MEDICAL HISTORY: Past Medical History:  Diagnosis Date   Anemia    Anxiety    Chlamydia 11/13/2021   Constipation, chronic    Depression    Environmental allergies    Ganglion cyst of dorsum of left wrist    RECURRENT   GERD (gastroesophageal reflux disease)    watches diet   Headache    Hypertension    IBS (irritable bowel syndrome)    Lactose intolerance     PAST SURGICAL HISTORY: Past Surgical History:  Procedure Laterality Date   GANGLION CYST EXCISION  Left 04/24/2021   Procedure: left recurrent dorsal carpal ganglion excision;  Surgeon: [name], MD;  Location: [facility];  Service: Orthopedics;  Laterality: Left;   INCISION AND DRAINAGE PERIRECTAL ABSCESS N/A 03/14/2022   Procedure: IRRIGATION AND DEBRIDEMENT PERIRECTAL ABSCESS;  Surgeon: Michael Boston, MD;  Location: [facility];  Service: General;  Laterality: N/A;   TONSILLECTOMY AND ADENOIDECTOMY     AGE 59   WISDOM TOOTH EXTRACTION  03/18/2021   WRIST GANGLION EXCISION Left 02/2020    MEDICATIONS: Current Outpatient Medications on File Prior to Visit  Medication Sig Dispense Refill   acetaminophen (TYLENOL) 325 MG tablet Take 2 tablets (650 mg total) by mouth every 6 (six) hours as needed for fever or mild pain. 20 tablet 0   AMBULATORY NON FORMULARY MEDICATION Medication Name: Diltiazem 2% gel -Using your index finger, apply a small amount of medication inside the rectum up to your first knuckle/joint three times daily x 6-8 weeks. 30 g 1   aspirin-acetaminophen-caffeine (EXCEDRIN MIGRAINE) [phone] MG tablet Take 2 tablets by mouth every 6 (six) hours as needed for headache or migraine.     baclofen (LIORESAL) 10 MG tablet Take 10 mg by mouth.     cetirizine (ZYRTEC) 10 MG tablet Take 10 mg by mouth daily as needed for allergies.     dicyclomine (BENTYL) 20 MG tablet Take 1 tablet (20 mg total) by mouth 4 (four) times daily -  before meals and at bedtime. 120 tablet 1   FLUoxetine (PROZAC) 40 MG capsule Take 1 capsule (40 mg total) by mouth daily. (Patient taking differently: Take 40 mg by mouth every morning.) 90 capsule 3   fluticasone (FLONASE) 50 MCG/ACT nasal spray Place 2 sprays into both nostrils daily. (Patient taking differently: Place 2 sprays into both nostrils daily as needed for allergies.) 16 g 6   hyoscyamine (LEVSIN SL) 0.125 MG SL tablet Place 1 tablet (0.125 mg total) under the tongue every 4 (four) hours as needed. (Patient not taking: Reported on  05/04/2022) 30 tablet 2   ibuprofen (ADVIL) 800 MG tablet Take 1 tablet (800 mg total) by mouth 3 (three) times daily. 21 tablet 0   lubiprostone (AMITIZA) 8 MCG capsule Take 1 capsule (8 mcg total) by mouth 2 (two) times daily with a meal. 60 capsule 2   methocarbamol (ROBAXIN) 500 MG tablet Take 2 tablets (1,000 mg total) by mouth 4 (four) times daily for 7 days. 56 tablet 0   Norethindrone Acetate-Ethinyl Estrad-FE (LOESTRIN 24 FE) 1-20 MG-MCG(24) tablet Take 1 tablet by mouth daily. 20 tablet 11   omeprazole (PRILOSEC) 40 MG capsule Take 1 capsule (40 mg total) by mouth daily. 30 capsule 5   omeprazole (PRILOSEC) 40 MG capsule Take 1 capsule (40 mg total) by mouth 2 (two) times daily. 90 capsule 3   ondansetron (ZOFRAN) 4 MG tablet Take 1 tablet (4 mg total) by mouth every 8 (eight) hours as needed for nausea or vomiting. 20 tablet 0   oxyCODONE (OXY IR/ROXICODONE) 5 MG immediate release tablet Take 1 tablet (5 mg total) by mouth every 6 (six) hours as needed for moderate pain, severe pain or breakthrough pain. 10 tablet 0   polyethylene glycol (MIRALAX / GLYCOLAX) 17 g packet Take 17 g by mouth every 12 (twelve) hours as needed for mild constipation, moderate constipation or severe constipation. 14 each 0   promethazine (PHENERGAN) 25 MG tablet Take 1 tablet (25 mg total) by mouth every 6 (six) hours as needed  for nausea or vomiting. 15 tablet 0   Psyllium (METAMUCIL PO) Take 1 packet by mouth daily as needed (constipation).     triamcinolone cream (KENALOG) 0.1 % Apply 1 Application topically 2 (two) times daily as needed (eczema).     zonisamide (ZONEGRAN) 25 MG capsule Take 50 mg by mouth 2 (two) times daily.     No current facility-administered medications on file prior to visit.    ALLERGIES: Allergies  Allergen Reactions   Blue Dyes (Parenteral) Anaphylaxis   Blueberry [Vaccinium Angustifolium] Anaphylaxis   Raspberry Anaphylaxis   Shellfish Allergy Anaphylaxis    ALL SHELLFISH    Amitriptyline Other (See Comments)    Fast heart rate and throat closing    FAMILY HISTORY: Family History  Problem Relation Age of Onset   Irritable bowel syndrome Mother    Asthma Father    Diabetes Maternal Grandmother    Diabetes Paternal Grandmother    Kidney disease Paternal Alanda Slim' disease Paternal Aunt    Ovarian cancer Maternal Aunt    Stomach cancer Neg Hx    Esophageal cancer Neg Hx    Colon cancer Neg Hx     Objective:  Blood pressure 125/75, pulse 99, height 5' 8"  (1.727 m), weight 244 lb 12.8 oz (111 kg), SpO2 97 %. General: No acute distress.  Patient appears well-groomed.   Head:  Normocephalic/atraumatic Eyes:  fundi examined but not visualized Neck: supple, no paraspinal tenderness, full range of motion Back: No paraspinal tenderness Heart: regular rate and rhythm Lungs: Clear to auscultation bilaterally. Vascular: No carotid bruits. Neurological Exam: Mental status: alert and oriented to person, place, and time, speech fluent and not dysarthric, language intact. Cranial nerves: CN I: not tested CN II: pupils equal, round and reactive to light, visual fields intact CN III, IV, VI:  full range of motion, no nystagmus, no ptosis CN V: facial sensation intact. CN VII: upper and lower face symmetric CN VIII: hearing intact CN IX, X: gag intact, uvula midline CN XI: sternocleidomastoid and trapezius muscles intact CN XII: tongue midline Bulk & Tone: normal, no fasciculations. Motor:  muscle strength 5/5 throughout Sensation:  Pinprick, temperature and vibratory sensation intact. Deep Tendon Reflexes:  3+ patellars, otherwise 2+ throughout,  toes downgoing.   Finger to nose testing:  Without dysmetria.   Heel to shin:  Without dysmetria.   Gait:  Normal station and stride.  Romberg negative.    Thank you for allowing me to take part in the care of this patient.  Metta Clines, DO  CC:  Chrisandra Netters, MD  Donney Dice, DO

## 2022-06-08 ENCOUNTER — Ambulatory Visit: Payer: Medicaid Other | Admitting: Neurology

## 2022-06-08 ENCOUNTER — Encounter: Payer: Self-pay | Admitting: Neurology

## 2022-06-08 VITALS — BP 125/75 | HR 99 | Ht 68.0 in | Wt 244.8 lb

## 2022-06-08 DIAGNOSIS — R52 Pain, unspecified: Secondary | ICD-10-CM

## 2022-06-08 DIAGNOSIS — G43719 Chronic migraine without aura, intractable, without status migrainosus: Secondary | ICD-10-CM | POA: Diagnosis not present

## 2022-06-08 MED ORDER — AIMOVIG 140 MG/ML ~~LOC~~ SOAJ: 140.0000 mg | SUBCUTANEOUS | 11 refills | Status: DC

## 2022-06-08 NOTE — Patient Instructions (Signed)
  Start Aimovig 127m every 28 days.  STOP ZONISAMIDE Take Ubrelvy 1050mat earliest onset of headache.  May repeat dose once in 2 hours if needed.  Maximum 2 tablets in 24 hours.  Contact me if it works or not. STOP EXCEDRIN.  May still use baclofen no more than 2 days out of week. Limit use of pain relievers to no more than 2 days out of the week.  These medications include acetaminophen, NSAIDs (ibuprofen/Advil/Motrin, naproxen/Aleve, triptans (Imitrex/sumatriptan), Excedrin, and narcotics.  This will help reduce risk of rebound headaches. Be aware of common food triggers:  - Caffeine:  coffee, black tea, cola, Mt. Dew  - Chocolate  - Dairy:  aged cheeses (brie, blue, cheddar, gouda, PaNetartsprovolone, ro[place]portSwiss, etc), chocolate milk, buttermilk, sour cream, limit eggs and yogurt  - Nuts, peanut butter  - Alcohol  - Cereals/grains:  FRESH breads (fresh bagels, sourdough, doughnuts), yeast productions  - Processed/canned/aged/cured meats (pre-packaged deli meats, hotdogs)  - MSG/glutamate:  soy sauce, flavor enhancer, pickled/preserved/marinated foods  - Sweeteners:  aspartame (Equal, Nutrasweet).  Sugar and Splenda are okay  - Vegetables:  legumes (lima beans, lentils, snow peas, fava beans, pinto peans, peas, garbanzo beans), sauerkraut, onions, olives, pickles  - Fruit:  avocados, bananas, citrus fruit (orange, lemon, grapefruit), mango  - Other:  Frozen meals, macaroni and cheese Routine exercise Stay adequately hydrated (aim for 64 oz water daily) Keep headache diary Maintain proper stress management Maintain proper sleep hygiene Do not skip meals Consider supplements:  magnesium citrate 4008maily, riboflavin 400m48mily, coenzyme Q10 100mg75mee times daily. Follow up 4-5 months.

## 2022-06-27 ENCOUNTER — Telehealth: Payer: Medicaid Other | Admitting: Nurse Practitioner

## 2022-06-27 DIAGNOSIS — R6889 Other general symptoms and signs: Secondary | ICD-10-CM | POA: Diagnosis not present

## 2022-06-27 MED ORDER — ALBUTEROL SULFATE HFA 108 (90 BASE) MCG/ACT IN AERS: 1.0000 | INHALATION_SPRAY | Freq: Four times a day (QID) | RESPIRATORY_TRACT | 0 refills | Status: DC | PRN

## 2022-06-27 MED ORDER — OSELTAMIVIR PHOSPHATE 75 MG PO CAPS: 75.0000 mg | ORAL_CAPSULE | Freq: Two times a day (BID) | ORAL | 0 refills | Status: AC

## 2022-06-27 MED ORDER — FLUTICASONE PROPIONATE 50 MCG/ACT NA SUSP: 2.0000 | Freq: Every day | NASAL | 0 refills | Status: DC

## 2022-06-27 NOTE — Progress Notes (Signed)

## 2022-06-27 NOTE — Progress Notes (Signed)
I have spent 5 minutes in review of e-visit questionnaire, review and updating patient chart, medical decision making and response to patient.  ° °Nyssa Sayegh W Lorely Bubb, NP ° °  °

## 2022-06-27 NOTE — Addendum Note (Signed)
Addended by: Geryl Rankins on: 06/27/2022 07:46 PM   Modules accepted: Orders

## 2022-07-02 ENCOUNTER — Ambulatory Visit (HOSPITAL_COMMUNITY)
Admission: EM | Admit: 2022-07-02 | Discharge: 2022-07-02 | Disposition: A | Discharge status: Home / Self Care | Payer: Medicaid Other | Attending: Family Medicine | Admitting: Family Medicine

## 2022-07-02 ENCOUNTER — Encounter (HOSPITAL_COMMUNITY): Payer: Self-pay | Admitting: Emergency Medicine

## 2022-07-02 ENCOUNTER — Other Ambulatory Visit: Payer: Self-pay

## 2022-07-02 DIAGNOSIS — J209 Acute bronchitis, unspecified: Secondary | ICD-10-CM | POA: Diagnosis not present

## 2022-07-02 MED ORDER — PREDNISONE 20 MG PO TABS: 40.0000 mg | ORAL_TABLET | Freq: Every day | ORAL | 0 refills | Status: AC

## 2022-07-02 MED ORDER — AZITHROMYCIN 250 MG PO TABS: ORAL_TABLET | ORAL | 0 refills | Status: DC

## 2022-07-02 NOTE — Discharge Instructions (Signed)
Antibiotics as prescribed Prednisone daily for 5 days Rest and fluids Over-the-counter cough medication as needed Please go to the emergency room if your symptoms worsen please follow-up with your PCP if your symptoms do not improve I hope you feel better soon!

## 2022-07-02 NOTE — ED Provider Notes (Signed)
[place]    CSN: [id number] Arrival date & time: 07/02/22  1919      History   Chief Complaint Chief Complaint  Patient presents with   Generalized Body Aches    HPI Jacqueline Gonzalez is a 21 y.o. female who presents for evaluation of URI symptoms for 14 days. Patient reports associated symptoms of cough, congestion, sore throat, low-grade fever. Denies N/V/D, body aches, ear pain, shortness of breath. Patient does not have a hx of asthma or smoking. No known sick contacts and no recent travel. Pt is vaccinated for COVID. Pt is not vaccinated for flu this season.  Patient did an e-visit on 12/9 were the note states patient reported 2 days of flulike symptoms.  She was started on Tamiflu and prescribed albuterol inhaler.  Patient reports her symptoms had been ongoing for at least a week at the time of the visit She has had no improvement on the Tamiflu.  Pt has taken ibuprofen and Tylenol OTC for symptoms. Pt has no other concerns at this time.    HPI  Past Medical History:  Diagnosis Date   Anemia    Anxiety    Chlamydia 11/13/2021   Constipation, chronic    Depression    Environmental allergies    Ganglion cyst of dorsum of left wrist    RECURRENT   GERD (gastroesophageal reflux disease)    watches diet   Headache    Hypertension    IBS (irritable bowel syndrome)    Lactose intolerance     Patient Active Problem List   Diagnosis Date Noted   Rectal abscess supralevator s/p I&D 03/14/2022 03/14/2022   Rectal pain    UTI (urinary tract infection) 03/10/2022   Rectal bleeding    Anal fissure    Abnormal CT scan, colon    Proctocolitis 03/09/2022   Chlamydia 11/18/2021   Menorrhagia with irregular cycle 11/18/2021   Dysmenorrhea 11/18/2021   Chronic anemia 11/17/2021   Lower abdominal pain 11/07/2021   Moderate episode of recurrent major depressive disorder ([place]) 06/23/2021   Low back pain 05/02/2021   Headache 09/03/2020   Nonspecific abdominal  pain 08/30/2020   Ganglion cyst of dorsum of left wrist 11/03/2019   Constipation, chronic 01/21/2018   Irregular periods 01/21/2018   Generalized anxiety disorder 01/21/2018    Past Surgical History:  Procedure Laterality Date   GANGLION CYST EXCISION Left 04/24/2021   Procedure: left recurrent dorsal carpal ganglion excision;  Surgeon: [name], MD;  Location: [facility];  Service: Orthopedics;  Laterality: Left;   INCISION AND DRAINAGE PERIRECTAL ABSCESS N/A 03/14/2022   Procedure: IRRIGATION AND DEBRIDEMENT PERIRECTAL ABSCESS;  Surgeon: Michael Boston, MD;  Location: [facility];  Service: General;  Laterality: N/A;   TONSILLECTOMY AND ADENOIDECTOMY     AGE 70   WISDOM TOOTH EXTRACTION  03/18/2021   WRIST GANGLION EXCISION Left 02/2020    OB History     Gravida  0   Para  0   Term  0   Preterm  0   AB  0   Living  0      SAB  0   IAB  0   Ectopic  0   Multiple  0   Live Births  0            Home Medications    Prior to Admission medications   Medication Sig Start Date End Date Taking? Authorizing Provider  azithromycin (ZITHROMAX Z-PAK) 250 MG tablet  Take 2 tablets on day 1 by mouth and then 1 tablet by mouth daily for 4 days 07/02/22  Yes Melynda Ripple, NP  predniSONE (DELTASONE) 20 MG tablet Take 2 tablets (40 mg total) by mouth daily with breakfast for 5 days. 07/02/22 07/07/22 Yes Melynda Ripple, NP  acetaminophen (TYLENOL) 325 MG tablet Take 2 tablets (650 mg total) by mouth every 6 (six) hours as needed for fever or mild pain. 03/16/22   Raiford Noble Latif, DO  albuterol (VENTOLIN HFA) 108 (90 Base) MCG/ACT inhaler Inhale 1-2 puffs into the lungs every 6 (six) hours as needed for wheezing or shortness of breath. 06/27/22   Gildardo Pounds, NP  AMBULATORY NON FORMULARY MEDICATION Medication Name: Diltiazem 2% gel -Using your index finger, apply a small amount of medication inside the rectum up to your first knuckle/joint three times daily  x 6-8 weeks. 03/03/22   Levin Erp, PA  baclofen (LIORESAL) 10 MG tablet Take 10 mg by mouth.    [provider]  cetirizine (ZYRTEC) 10 MG tablet Take 10 mg by mouth daily as needed for allergies.    [provider]  dicyclomine (BENTYL) 20 MG tablet Take 1 tablet (20 mg total) by mouth 4 (four) times daily -  before meals and at bedtime. 04/30/22   Levin Erp, PA  Erenumab-aooe (AIMOVIG) 140 MG/ML SOAJ Inject 140 mg into the skin every 28 (twenty-eight) days. 06/08/22   Pieter Partridge, DO  FLUoxetine (PROZAC) 40 MG capsule Take 1 capsule (40 mg total) by mouth daily. Patient taking differently: Take 40 mg by mouth every morning. 12/31/21   Shary Key, DO  fluticasone (FLONASE) 50 MCG/ACT nasal spray Place 2 sprays into both nostrils daily. 06/27/22   Gildardo Pounds, NP  hyoscyamine (LEVSIN SL) 0.125 MG SL tablet Place 1 tablet (0.125 mg total) under the tongue every 4 (four) hours as needed. Patient not taking: Reported on 05/04/2022 03/03/22   Levin Erp, PA  ibuprofen (ADVIL) 800 MG tablet Take 1 tablet (800 mg total) by mouth 3 (three) times daily. 06/04/22   Luvenia Heller, PA-C  lubiprostone (AMITIZA) 8 MCG capsule Take 1 capsule (8 mcg total) by mouth 2 (two) times daily with a meal. 03/03/22   Lemmon, Lavone Nian, PA  omeprazole (PRILOSEC) 40 MG capsule Take 1 capsule (40 mg total) by mouth daily. 04/30/22   Levin Erp, PA  omeprazole (PRILOSEC) 40 MG capsule Take 1 capsule (40 mg total) by mouth 2 (two) times daily. 05/04/22   Sharyn Creamer, MD  oseltamivir (TAMIFLU) 75 MG capsule Take 1 capsule (75 mg total) by mouth 2 (two) times daily for 5 days. 06/27/22 07/02/22  Gildardo Pounds, NP  polyethylene glycol (MIRALAX / GLYCOLAX) 17 g packet Take 17 g by mouth every 12 (twelve) hours as needed for mild constipation, moderate constipation or severe constipation. 03/16/22   Raiford Noble Latif, DO  promethazine  (PHENERGAN) 25 MG tablet Take 1 tablet (25 mg total) by mouth every 6 (six) hours as needed for nausea or vomiting. Patient not taking: Reported on 07/02/2022 04/20/22   Horton, Barbette Hair, MD  Psyllium (METAMUCIL PO) Take 1 packet by mouth daily as needed (constipation).    [provider]  triamcinolone cream (KENALOG) 0.1 % Apply 1 Application topically 2 (two) times daily as needed (eczema). 02/06/22   [provider]    Family History Family History  Problem Relation Age of Onset  Migraines Mother    Irritable bowel syndrome Mother    Asthma Father    Ovarian cancer Maternal Aunt    Graves' disease Paternal Aunt    Diabetes Maternal Grandmother    Stroke Paternal Grandmother    Diabetes Paternal Grandmother    Kidney disease Paternal Grandfather    Stomach cancer Neg Hx    Esophageal cancer Neg Hx    Colon cancer Neg Hx     Social History Social History   Tobacco Use   Smoking status: Never   Smokeless tobacco: Never  Vaping Use   Vaping Use: Never used  Substance Use Topics   Alcohol use: Yes    Comment: occ.   Drug use: Never     Allergies   Blue dyes (parenteral), Blueberry [vaccinium angustifolium], Raspberry, Shellfish allergy, and Amitriptyline   Review of Systems Review of Systems  Constitutional:  Positive for fever.  HENT:  Positive for congestion and sore throat.   Respiratory:  Positive for cough.      Physical Exam Triage Vital Signs ED Triage Vitals  Enc Vitals Group     BP 07/02/22 2043 108/76     Pulse Rate 07/02/22 2043 77     Resp 07/02/22 2043 20     Temp 07/02/22 2043 98.3 F (36.8 C)     Temp Source 07/02/22 2043 Oral     SpO2 07/02/22 2043 98 %     Weight --      Height --      Head Circumference --      Peak Flow --      Pain Score 07/02/22 2039 8     Pain Loc --      Pain Edu? --      Excl. in [place]? --    No data found.  Updated Vital Signs BP 108/76 (BP Location: Left Arm)   Pulse 77   Temp 98.3 F  (36.8 C) (Oral)   Resp 20   SpO2 98%   Visual Acuity Right Eye Distance:   Left Eye Distance:   Bilateral Distance:    Right Eye Near:   Left Eye Near:    Bilateral Near:     Physical Exam Vitals and nursing note reviewed.  Constitutional:      General: She is not in acute distress.    Appearance: She is well-developed. She is not ill-appearing.  HENT:     Head: Normocephalic and atraumatic.     Right Ear: Tympanic membrane and ear canal normal.     Left Ear: Tympanic membrane and ear canal normal.     Nose: Congestion present.     Mouth/Throat:     Mouth: Mucous membranes are moist.     Pharynx: Oropharynx is clear. Uvula midline. No oropharyngeal exudate or posterior oropharyngeal erythema.     Tonsils: No tonsillar exudate or tonsillar abscesses.  Eyes:     Conjunctiva/sclera: Conjunctivae normal.     Pupils: Pupils are equal, round, and reactive to light.  Cardiovascular:     Rate and Rhythm: Normal rate and regular rhythm.     Heart sounds: Normal heart sounds.  Pulmonary:     Effort: Pulmonary effort is normal.     Breath sounds: Normal breath sounds.  Musculoskeletal:     Cervical back: Normal range of motion and neck supple.  Lymphadenopathy:     Cervical: No cervical adenopathy.  Skin:    General: Skin is warm and dry.  Neurological:  General: No focal deficit present.     Mental Status: She is alert and oriented to person, place, and time.  Psychiatric:        Mood and Affect: Mood normal.        Behavior: Behavior normal.      UC Treatments / Results  Labs (all labs ordered are listed, but only abnormal results are displayed) Labs Reviewed - No data to display  EKG   Radiology No results found.  Procedures Procedures (including critical care time)  Medications Ordered in UC Medications - No data to display  Initial Impression / Assessment and Plan / UC Course  I have reviewed the triage vital signs and the nursing  notes.  Pertinent labs & imaging results that were available during my care of the patient were reviewed by me and considered in my medical decision making (see chart for details).     Reviewed exam and symptoms with patient.  No red flags on exam Given length of symptoms and reported continued fevers we will start Zithromax Prednisone twice daily for 5 days She will use albuterol inhaler as needed Rest and fluids OTC cough medication as needed Advised PCP follow-up if symptoms do not improve ER precautions reviewed and patient verbalized understanding Final Clinical Impressions(s) / UC Diagnoses   Final diagnoses:  Acute bronchitis, unspecified organism     Discharge Instructions      Antibiotics as prescribed Prednisone daily for 5 days Rest and fluids Over-the-counter cough medication as needed Please go to the emergency room if your symptoms worsen please follow-up with your PCP if your symptoms do not improve I hope you feel better soon!    ED Prescriptions     Medication Sig Dispense Auth. Provider   azithromycin (ZITHROMAX Z-PAK) 250 MG tablet Take 2 tablets on day 1 by mouth and then 1 tablet by mouth daily for 4 days 6 each Melynda Ripple, NP   predniSONE (DELTASONE) 20 MG tablet Take 2 tablets (40 mg total) by mouth daily with breakfast for 5 days. 10 tablet Melynda Ripple, NP      PDMP not reviewed this encounter.   Melynda Ripple, NP 07/02/22 2101

## 2022-07-02 NOTE — ED Triage Notes (Signed)
Cough for 2 weeks.  General aches and pains, headaches.    E-visit 3-4 days ago and was prescribed inhaler and tamiflu.  No improvement in symptoms  Has taken ibuprofen

## 2022-07-06 ENCOUNTER — Telehealth: Payer: Self-pay

## 2022-07-06 NOTE — Telephone Encounter (Signed)
Patient Advocate Encounter  Prior Authorization for Aimovig 140MG/ML auto-injectors has been approved through [employer].    Effective: 06-29-2022 to 09-27-2022

## 2022-07-07 ENCOUNTER — Other Ambulatory Visit: Payer: Self-pay

## 2022-07-07 ENCOUNTER — Encounter: Payer: Self-pay | Admitting: Physical Therapy

## 2022-07-07 ENCOUNTER — Ambulatory Visit: Payer: Medicaid Other | Attending: Nurse Practitioner | Admitting: Physical Therapy

## 2022-07-07 DIAGNOSIS — M6281 Muscle weakness (generalized): Secondary | ICD-10-CM | POA: Insufficient documentation

## 2022-07-07 DIAGNOSIS — M542 Cervicalgia: Secondary | ICD-10-CM | POA: Insufficient documentation

## 2022-07-07 DIAGNOSIS — M5459 Other low back pain: Secondary | ICD-10-CM | POA: Diagnosis not present

## 2022-07-07 NOTE — Therapy (Signed)
OUTPATIENT PHYSICAL THERAPY THORACOLUMBAR EVALUATION  Patient Name: Jacqueline Gonzalez MRN: [id number] DOB:05/10/2001, 21 y.o., female Today's Date: 07/08/2022   PT End of Session - 07/08/22 1120     Visit Number 1    Number of Visits --   1-2x/week   Date for PT Re-Evaluation 09/02/22    Authorization Type healthy blue - ODI    PT Start Time 0500    PT Stop Time 0544    PT Time Calculation (min) 44 min             Past Medical History:  Diagnosis Date   Anemia    Anxiety    Chlamydia 11/13/2021   Constipation, chronic    Depression    Environmental allergies    Ganglion cyst of dorsum of left wrist    RECURRENT   GERD (gastroesophageal reflux disease)    watches diet   Headache    Hypertension    IBS (irritable bowel syndrome)    Lactose intolerance    Past Surgical History:  Procedure Laterality Date   GANGLION CYST EXCISION Left 04/24/2021   Procedure: left recurrent dorsal carpal ganglion excision;  Surgeon: [name], MD;  Location: [facility];  Service: Orthopedics;  Laterality: Left;   INCISION AND DRAINAGE PERIRECTAL ABSCESS N/A 03/14/2022   Procedure: IRRIGATION AND DEBRIDEMENT PERIRECTAL ABSCESS;  Surgeon: Michael Boston, MD;  Location: [facility];  Service: General;  Laterality: N/A;   TONSILLECTOMY AND ADENOIDECTOMY     AGE 53   WISDOM TOOTH EXTRACTION  03/18/2021   WRIST GANGLION EXCISION Left 02/2020   Patient Active Problem List   Diagnosis Date Noted   Rectal abscess supralevator s/p I&D 03/14/2022 03/14/2022   Rectal pain    UTI (urinary tract infection) 03/10/2022   Rectal bleeding    Anal fissure    Abnormal CT scan, colon    Proctocolitis 03/09/2022   Chlamydia 11/18/2021   Menorrhagia with irregular cycle 11/18/2021   Dysmenorrhea 11/18/2021   Chronic anemia 11/17/2021   Lower abdominal pain 11/07/2021   Moderate episode of recurrent major depressive disorder ([place]) 06/23/2021   Low back pain 05/02/2021   Headache  09/03/2020   Nonspecific abdominal pain 08/30/2020   Ganglion cyst of dorsum of left wrist 11/03/2019   Constipation, chronic 01/21/2018   Irregular periods 01/21/2018   Generalized anxiety disorder 01/21/2018    PCP: Donney Dice, DO  REFERRING PROVIDER: Dianna Rossetti, NP  THERAPY DIAG:  Other low back pain - Plan: PT plan of care cert/re-cert  Muscle weakness (generalized) - Plan: PT plan of care cert/re-cert  Cervicalgia - Plan: PT plan of care cert/re-cert  REFERRING DIAG: Back pain [M54.9], Neck pain [M54.2]   Rationale for Evaluation and Treatment:  Rehabilitation  SUBJECTIVE:  PERTINENT PAST HISTORY:  Anxiety, anemia, migraines      PRECAUTIONS: None  WEIGHT BEARING RESTRICTIONS No  FALLS:  Has patient fallen in last 6 months? Yes, Number of falls: had one episode of LOC (attributed to migraines by neuro)  MOI/History of condition:  Onset date: Chronic > 1 year but worsening over the last year  SUBJECTIVE STATEMENT  Jacqueline Gonzalez is a 21 y.o. female who presents to clinic with chief complaint of neck and back pain with radiating pain to bil UE and bil LE.  Denies BB changes.  Denies saddle anesthesia.  About 1x/ month her entire L leg will go numb including the L side of there groin (this has been going on for greater than 1  year).  Pt aunt does have fibromyalgia.    Red flags:  endorses bil numbness and tingling  Pain:  Are you having pain? Yes Pain location: neck and shoulder as well as low back and LE pain NPRS scale:  6/10 to 8/10 Aggravating factors: driving (L hand R leg), walking (15-20 min), going down stairs Relieving factors: hot shower Pain description: sharp and aching Stage: Chronic Stability: getting worse 24 hour pattern: worse with activity   Occupation: Materials engineer, in school  Assistive Device: NA  Hand Dominance: R  Patient Goals/Specific Activities: reduce pain   OBJECTIVE:   DIAGNOSTIC FINDINGS:  Nerve  conduction - normal of LE  GENERAL OBSERVATION/GAIT:  Forward head, rounded shoulders  SENSATION:  Light touch: R C8 diminished, LE WNL  Cervical ROM  ROM ROM  07/08/2022  Flexion N*  Extension N*  Right lateral flexion N*  Left lateral flexion N*  Right rotation N*  Left rotation N*  Flexion rotation (normal is 30 degrees)   Flexion rotation (normal is 30 degrees)     (Blank rows = not tested, N = WNL, * = concordant pain)   LUMBAR AROM  AROM AROM  07/08/2022  Flexion Fingertips to mid shin (limited by ~25%), w/ concordant pain  Extension limited by 25%, w/ concordant pain  Right lateral flexion WNL, w/ concordant pain  Left lateral flexion WNL, w/ concordant pain  Right rotation WNL  Left rotation WNL    (Blank rows = not tested)    LUMBAR SPECIAL TESTS:  Slump: L (-), R (-)  UPPER EXTREMITY MMT:  MMT Right 07/08/2022 Left 07/08/2022  Shoulder flexion 3+* 3+*  Shoulder abduction (C5) c C  Shoulder ER 4* 4*  Shoulder IR    Middle trapezius    Lower trapezius    Shoulder extension    Grip strength    Cervical flexion (C1,C2)    Cervical S/B (C3)    Shoulder shrug (C4) c c  Elbow flexion (C6) c c  Elbow ext (C7) c c  Thumb ext (C8) c c  Finger abd (T1) c c  Grossly     (Blank rows = not tested, score listed is out of 5 possible points.  N = WNL, D = diminished, C = clear for gross weakness with myotome testing, * = concordant pain with testing)   LE MMT:  MMT Right 07/08/2022 Left 07/08/2022  Hip flexion (L2, L3) 4* 4*  Knee extension (L3) 4* 3+*  Knee flexion 4* 4*  Hip abduction    Hip extension    Hip external rotation    Hip internal rotation    Hip adduction    Ankle dorsiflexion (L4) c c  Ankle plantarflexion (S1) c c  Ankle inversion    Ankle eversion    Great Toe ext (L5) c c  Grossly     (Blank rows = not tested, score listed is out of 5 possible points.  N = WNL, D = diminished, C = clear for gross weakness with myotome  testing, * = concordant pain with testing)    PALPATION:   Significant TTP bil UT L>R and bil lumbar paraspinals; general hyper-sensitively to pressure with MMT  PATIENT SURVEYS:  Modified Oswestry 31/50    TODAY'S TREATMENT  Creating, reviewing, and completing below HEP  PATIENT EDUCATION:  POC, diagnosis, prognosis, HEP, and outcome measures.  Pt educated via explanation, demonstration, and handout (HEP).  Pt confirms understanding verbally.   HOME EXERCISE PROGRAM: Access Code:  [email or web address] URL: [email or web address] Date: 07/07/2022 Prepared by: Shearon Balo  Exercises - Seated Scapular Retraction  - 1 x daily - 7 x weekly - 3 sets - 10 reps - Supine Lower Trunk Rotation  - 1 x daily - 7 x weekly - 1 sets - 20 reps - 3 hold  ASTERISK SIGNS   Asterisk Signs Eval (07/08/2022)       Pain 6-8/10       Shoulder flexion MMT 3+ bil       L knee ext MMT 3+                         ASSESSMENT:  CLINICAL IMPRESSION: Jacqueline Gonzalez is a 21 y.o. female who presents to clinic with signs and sxs consistent with chronic low back and neck pain as well as more global pain which is more consistent with central sensitization or fibromyalgia type pain.  Her neuro screen for upper and lower quarter are clear for significant pathology.  She has (-) LE neural tension tests.  She has generalized pain in bil UE and LE which is in no specific pattern and varies significantly day to day and is not necessarily movement related.  I think she will do weill with gradual strengthening and graded exposure in aquatic therapy.    OBJECTIVE IMPAIRMENTS: Pain, LE and UE strength  ACTIVITY LIMITATIONS: lifting, bending, housework, schoolwork   PERSONAL FACTORS: See medical history and pertinent history   REHAB POTENTIAL: Good  CLINICAL DECISION MAKING: Stable/uncomplicated  EVALUATION COMPLEXITY: Low   GOALS:   SHORT TERM GOALS: Target date: 08/05/2022  Jacqueline Gonzalez will be >75% HEP  compliant to improve carryover between sessions and facilitate independent management of condition  Evaluation (07/08/2022): ongoing Goal status: INITIAL   LONG TERM GOALS: Target date: 09/02/2022  Jacqueline Gonzalez will show a >/= 12 pt improvement in their ODI score (MCID is 12 pts) as a proxy for functional improvement   Evaluation/Baseline (07/08/2022): 31/50 Goal status: INITIAL   2.  Jacqueline Gonzalez will self report >/= 50% decrease in pain from evaluation   Evaluation/Baseline (07/08/2022): 6-8/10 pain Goal status: INITIAL   3.  Jacqueline Gonzalez will improve the following MMTs to >/= 4/5 to show improvement in strength:     Evaluation/Baseline (07/08/2022):   UPPER EXTREMITY MMT:  MMT Right 07/08/2022 Left 07/08/2022  Shoulder flexion 3+* 3+*  Shoulder abduction (C5) c C  Shoulder ER 4* 4*  Shoulder IR    Middle trapezius    Lower trapezius    Shoulder extension    Grip strength    Cervical flexion (C1,C2)    Cervical S/B (C3)    Shoulder shrug (C4) c c  Elbow flexion (C6) c c  Elbow ext (C7) c c  Thumb ext (C8) c c  Finger abd (T1) c c  Grossly     (Blank rows = not tested, score listed is out of 5 possible points.  N = WNL, D = diminished, C = clear for gross weakness with myotome testing, * = concordant pain with testing)   LE MMT:  MMT Right 07/08/2022 Left 07/08/2022  Hip flexion (L2, L3) 4* 4*  Knee extension (L3) 4* 3+*  Knee flexion 4* 4*  Hip abduction    Hip extension    Hip external rotation    Hip internal rotation    Hip adduction    Ankle dorsiflexion (L4) c c  Ankle plantarflexion (S1) c c  Ankle inversion    Ankle eversion  Great Toe ext (L5) c c  Grossly     (Blank rows = not tested, score listed is out of 5 possible points.  N = WNL, D = diminished, C = clear for gross weakness with myotome testing, * = concordant pain with testing)  Goal status: INITIAL   4.  Jacqueline Gonzalez will report confidence in self management of condition at time of discharge  with advanced HEP  Evaluation/Baseline (07/08/2022): unable to self manage Goal status: INITIAL    PLAN: PT FREQUENCY: 1-2x/week  PT DURATION: 8 weeks (Ending 09/02/2022)  PLANNED INTERVENTIONS: Therapeutic exercises, Aquatic therapy, Therapeutic activity, Neuro Muscular re-education, Gait training, Patient/Family education, Joint mobilization, Dry Needling, Electrical stimulation, Spinal mobilization and/or manipulation, Moist heat, Taping, Vasopneumatic device, Ionotophoresis 26m/ml Dexamethasone, and Manual therapy  PLAN FOR NEXT SESSION: graded exposure, PNE, aquatic therapy   KShearon BaloPT, DPT 07/08/2022, 11:29 AM

## 2022-07-17 ENCOUNTER — Ambulatory Visit: Payer: Medicaid Other

## 2022-07-21 ENCOUNTER — Ambulatory Visit: Payer: Medicaid Other | Attending: Nurse Practitioner

## 2022-07-21 DIAGNOSIS — M6281 Muscle weakness (generalized): Secondary | ICD-10-CM | POA: Diagnosis present

## 2022-07-21 DIAGNOSIS — M5459 Other low back pain: Secondary | ICD-10-CM | POA: Insufficient documentation

## 2022-07-21 DIAGNOSIS — M542 Cervicalgia: Secondary | ICD-10-CM | POA: Insufficient documentation

## 2022-07-21 NOTE — Therapy (Signed)
OUTPATIENT PHYSICAL THERAPY TREATMENT NOTE   Patient Name: Jacqueline Gonzalez MRN: [id number] DOB:[birth year], 22 y.o., female Today's Date: 07/21/2022  PCP: Donney Dice, DO  REFERRING PROVIDER: Dianna Rossetti, NP   END OF SESSION:   PT End of Session - 07/21/22 1615     Visit Number 2    Date for PT Re-Evaluation 09/02/22    Authorization Type healthy blue - ODI    PT Start Time 1615    PT Stop Time 1655    PT Time Calculation (min) 40 min    Activity Tolerance Patient tolerated treatment well    Behavior During Therapy WFL for tasks assessed/performed             Past Medical History:  Diagnosis Date   Anemia    Anxiety    Chlamydia 11/13/2021   Constipation, chronic    Depression    Environmental allergies    Ganglion cyst of dorsum of left wrist    RECURRENT   GERD (gastroesophageal reflux disease)    watches diet   Headache    Hypertension    IBS (irritable bowel syndrome)    Lactose intolerance    Past Surgical History:  Procedure Laterality Date   GANGLION CYST EXCISION Left 04/24/2021   Procedure: left recurrent dorsal carpal ganglion excision;  Surgeon: [name], MD;  Location: [place];  Service: Orthopedics;  Laterality: Left;   INCISION AND DRAINAGE PERIRECTAL ABSCESS N/A 03/14/2022   Procedure: IRRIGATION AND DEBRIDEMENT PERIRECTAL ABSCESS;  Surgeon: Michael Boston, MD;  Location: [facility];  Service: General;  Laterality: N/A;   TONSILLECTOMY AND ADENOIDECTOMY     AGE 48   WISDOM TOOTH EXTRACTION  03/18/2021   WRIST GANGLION EXCISION Left 02/2020   Patient Active Problem List   Diagnosis Date Noted   Rectal abscess supralevator s/p I&D 03/14/2022 03/14/2022   Rectal pain    UTI (urinary tract infection) 03/10/2022   Rectal bleeding    Anal fissure    Abnormal CT scan, colon    Proctocolitis 03/09/2022   Chlamydia 11/18/2021   Menorrhagia with irregular cycle 11/18/2021   Dysmenorrhea 11/18/2021   Chronic anemia 11/17/2021    Lower abdominal pain 11/07/2021   Moderate episode of recurrent major depressive disorder ([place]) 06/23/2021   Low back pain 05/02/2021   Headache 09/03/2020   Nonspecific abdominal pain 08/30/2020   Ganglion cyst of dorsum of left wrist 11/03/2019   Constipation, chronic 01/21/2018   Irregular periods 01/21/2018   Generalized anxiety disorder 01/21/2018    REFERRING DIAG: Back pain [M54.9], Neck pain [M54.2]    THERAPY DIAG: Other low back pain - Plan: PT plan of care cert/re-cert   Muscle weakness (generalized) - Plan: PT plan of care cert/re-cert   Cervicalgia - Plan: PT plan of care cert/re-cert  Rationale for Evaluation and Treatment Rehabilitation  PERTINENT HISTORY: Anxiety, anemia, migraines   PRECAUTIONS: None   SUBJECTIVE:  SUBJECTIVE STATEMENT:  Reporting no change in symptoms, legs are weak and back is tender to touch.   PAIN:  Are you having pain? Yes: NPRS scale: 8/10 Pain location: low back and legs primarily Pain description: ache Aggravating factors: activity, stair climbing Relieving factors: nothing reported   OBJECTIVE: (objective measures completed at initial evaluation unless otherwise dated)   DIAGNOSTIC FINDINGS:  Nerve conduction - normal of LE   GENERAL OBSERVATION/GAIT:           Forward head, rounded shoulders   SENSATION:          Light touch: R C8 diminished, LE WNL   Cervical ROM   ROM ROM  07/08/2022  Flexion N*  Extension N*  Right lateral flexion N*  Left lateral flexion N*  Right rotation N*  Left rotation N*  Flexion rotation (normal is 30 degrees)    Flexion rotation (normal is 30 degrees)      (Blank rows = not tested, N = WNL, * = concordant pain)     LUMBAR AROM   AROM AROM  07/08/2022  Flexion Fingertips to mid shin (limited by ~25%), w/  concordant pain  Extension limited by 25%, w/ concordant pain  Right lateral flexion WNL, w/ concordant pain  Left lateral flexion WNL, w/ concordant pain  Right rotation WNL  Left rotation WNL    (Blank rows = not tested)     LUMBAR SPECIAL TESTS:  Slump: L (-), R (-)   UPPER EXTREMITY MMT:   MMT Right 07/08/2022 Left 07/08/2022  Shoulder flexion 3+* 3+*  Shoulder abduction (C5) c C  Shoulder ER 4* 4*  Shoulder IR      Middle trapezius      Lower trapezius      Shoulder extension      Grip strength      Cervical flexion (C1,C2)      Cervical S/B (C3)      Shoulder shrug (C4) c c  Elbow flexion (C6) c c  Elbow ext (C7) c c  Thumb ext (C8) c c  Finger abd (T1) c c  Grossly        (Blank rows = not tested, score listed is out of 5 possible points.  N = WNL, D = diminished, C = clear for gross weakness with myotome testing, * = concordant pain with testing)     LE MMT:   MMT Right 07/08/2022 Left 07/08/2022  Hip flexion (L2, L3) 4* 4*  Knee extension (L3) 4* 3+*  Knee flexion 4* 4*  Hip abduction      Hip extension      Hip external rotation      Hip internal rotation      Hip adduction      Ankle dorsiflexion (L4) c c  Ankle plantarflexion (S1) c c  Ankle inversion      Ankle eversion      Great Toe ext (L5) c c  Grossly        (Blank rows = not tested, score listed is out of 5 possible points.  N = WNL, D = diminished, C = clear for gross weakness with myotome testing, * = concordant pain with testing)      PALPATION:            Significant TTP bil UT L>R and bil lumbar paraspinals; general hyper-sensitively to pressure with MMT   PATIENT SURVEYS:  Modified Oswestry 31/50      TODAY'S TREATMENT  [facility]  Adult PT Treatment:                                                DATE: 07/21/22 Therapeutic Exercise: Supine OH flexion with inspiration 15x Supine alternating OH flexion 15/15 Supine hor abd YTB 15x Supine alt. Marching 15/15 Seated hamstring  stretch Supine QL stretch 30s x2 B PPT 3s 15x 90/90 30s x2 Open book 10/10  PATIENT EDUCATION:  POC, diagnosis, prognosis, HEP, and outcome measures.  Pt educated via explanation, demonstration, and handout (HEP).  Pt confirms understanding verbally.    HOME EXERCISE PROGRAM: Access Code: [email or web address] URL: [email or web address] Date: 07/07/2022 Prepared by: Shearon Balo   Exercises - Seated Scapular Retraction  - 1 x daily - 7 x weekly - 3 sets - 10 reps - Supine Lower Trunk Rotation  - 1 x daily - 7 x weekly - 1 sets - 20 reps - 3 hold   ASTERISK SIGNS     Asterisk Signs Eval (07/08/2022)            Pain 6-8/10            Shoulder flexion MMT 3+ bil            L knee ext MMT 3+                                              ASSESSMENT:   CLINICAL IMPRESSION: Today's session focused on stretching tasks designed to promoted general flexibility.  Focus on cervicothoracic and lumbo pelvic junctions for maximum coverage.  Able to tolerate session w/o marked increase in symptoms but some mild discomfort noted with all tasks.    OBJECTIVE IMPAIRMENTS: Pain, LE and UE strength   ACTIVITY LIMITATIONS: lifting, bending, housework, schoolwork    PERSONAL FACTORS: See medical history and pertinent history     REHAB POTENTIAL: Good   CLINICAL DECISION MAKING: Stable/uncomplicated   EVALUATION COMPLEXITY: Low     GOALS:     SHORT TERM GOALS: Target date: 08/05/2022   [place] will be >75% HEP compliant to improve carryover between sessions and facilitate independent management of condition   Evaluation (07/08/2022): ongoing Goal status: INITIAL     LONG TERM GOALS: Target date: 09/02/2022   [place] will show a >/= 12 pt improvement in their ODI score (MCID is 12 pts) as a proxy for functional improvement    Evaluation/Baseline (07/08/2022): 31/50 Goal status: INITIAL     2.  Arbadella will self report >/= 50% decrease in pain from evaluation     Evaluation/Baseline (07/08/2022): 6-8/10 pain Goal status: INITIAL     3.  Nirel will improve the following MMTs to >/= 4/5 to show improvement in strength:      Evaluation/Baseline (07/08/2022):    UPPER EXTREMITY MMT:   MMT Right 07/08/2022 Left 07/08/2022  Shoulder flexion 3+* 3+*  Shoulder abduction (C5) c C  Shoulder ER 4* 4*  Shoulder IR      Middle trapezius      Lower trapezius      Shoulder extension      Grip strength      Cervical flexion (C1,C2)      Cervical S/B (C3)      Shoulder shrug (C4) c c  Elbow flexion (C6) c c  Elbow ext (C7) c c  Thumb ext (C8) c c  Finger abd (T1) c c  Grossly        (Blank rows = not tested, score listed is out of 5 possible points.  N = WNL, D = diminished, C = clear for gross weakness with myotome testing, * = concordant pain with testing)     LE MMT:   MMT Right 07/08/2022 Left 07/08/2022  Hip flexion (L2, L3) 4* 4*  Knee extension (L3) 4* 3+*  Knee flexion 4* 4*  Hip abduction      Hip extension      Hip external rotation      Hip internal rotation      Hip adduction      Ankle dorsiflexion (L4) c c  Ankle plantarflexion (S1) c c  Ankle inversion      Ankle eversion      Great Toe ext (L5) c c  Grossly        (Blank rows = not tested, score listed is out of 5 possible points.  N = WNL, D = diminished, C = clear for gross weakness with myotome testing, * = concordant pain with testing)   Goal status: INITIAL     4.  Brandin will report confidence in self management of condition at time of discharge with advanced HEP   Evaluation/Baseline (07/08/2022): unable to self manage Goal status: INITIAL       PLAN: PT FREQUENCY: 1-2x/week   PT DURATION: 8 weeks (Ending 09/02/2022)   PLANNED INTERVENTIONS: Therapeutic exercises, Aquatic therapy, Therapeutic activity, Neuro Muscular re-education, Gait training, Patient/Family education, Joint mobilization, Dry Needling, Electrical stimulation, Spinal  mobilization and/or manipulation, Moist heat, Taping, Vasopneumatic device, Ionotophoresis 4mg /ml Dexamethasone, and Manual therapy   PLAN FOR NEXT SESSION: graded exposure, PNE, aquatic therapy   , PT 07/21/2022, 4:54 PM

## 2022-07-23 NOTE — Therapy (Signed)
OUTPATIENT PHYSICAL THERAPY TREATMENT NOTE   Patient Name: Jacqueline Gonzalez MRN: [id number] DOB:Nov 24, 2000, 22 y.o., female Today's Date: 07/24/2022  PCP: Donney Dice, DO  REFERRING PROVIDER: Dianna Rossetti, NP   END OF SESSION:   PT End of Session - 07/24/22 1521     Visit Number 3    Date for PT Re-Evaluation 09/02/22    Authorization Type healthy blue - ODI    PT Start Time 1520    PT Stop Time 1605    PT Time Calculation (min) 45 min    Activity Tolerance Patient tolerated treatment well    Behavior During Therapy WFL for tasks assessed/performed              Past Medical History:  Diagnosis Date   Anemia    Anxiety    Chlamydia 11/13/2021   Constipation, chronic    Depression    Environmental allergies    Ganglion cyst of dorsum of left wrist    RECURRENT   GERD (gastroesophageal reflux disease)    watches diet   Headache    Hypertension    IBS (irritable bowel syndrome)    Lactose intolerance    Past Surgical History:  Procedure Laterality Date   GANGLION CYST EXCISION Left 04/24/2021   Procedure: left recurrent dorsal carpal ganglion excision;  Surgeon: [name], MD;  Location: [place];  Service: Orthopedics;  Laterality: Left;   INCISION AND DRAINAGE PERIRECTAL ABSCESS N/A 03/14/2022   Procedure: IRRIGATION AND DEBRIDEMENT PERIRECTAL ABSCESS;  Surgeon: Michael Boston, MD;  Location: [facility];  Service: General;  Laterality: N/A;   TONSILLECTOMY AND ADENOIDECTOMY     AGE 47   WISDOM TOOTH EXTRACTION  03/18/2021   WRIST GANGLION EXCISION Left 02/2020   Patient Active Problem List   Diagnosis Date Noted   Rectal abscess supralevator s/p I&D 03/14/2022 03/14/2022   Rectal pain    UTI (urinary tract infection) 03/10/2022   Rectal bleeding    Anal fissure    Abnormal CT scan, colon    Proctocolitis 03/09/2022   Chlamydia 11/18/2021   Menorrhagia with irregular cycle 11/18/2021   Dysmenorrhea 11/18/2021   Chronic anemia 11/17/2021    Lower abdominal pain 11/07/2021   Moderate episode of recurrent major depressive disorder ([place]) 06/23/2021   Low back pain 05/02/2021   Headache 09/03/2020   Nonspecific abdominal pain 08/30/2020   Ganglion cyst of dorsum of left wrist 11/03/2019   Constipation, chronic 01/21/2018   Irregular periods 01/21/2018   Generalized anxiety disorder 01/21/2018    REFERRING DIAG: Back pain [M54.9], Neck pain [M54.2]    THERAPY DIAG: Other low back pain - Plan: PT plan of care cert/re-cert   Muscle weakness (generalized) - Plan: PT plan of care cert/re-cert   Cervicalgia - Plan: PT plan of care cert/re-cert  Rationale for Evaluation and Treatment Rehabilitation  PERTINENT HISTORY: Anxiety, anemia, migraines   PRECAUTIONS: None   SUBJECTIVE:  SUBJECTIVE STATEMENT:  Reporting no change in symptoms, BIL LE painful.   PAIN:  Are you having pain? Yes: NPRS scale: 8.5/10 Pain location: low back and legs primarily Pain description: ache Aggravating factors: activity, stair climbing Relieving factors: nothing reported   OBJECTIVE: (objective measures completed at initial evaluation unless otherwise dated)   DIAGNOSTIC FINDINGS:  Nerve conduction - normal of LE   GENERAL OBSERVATION/GAIT:           Forward head, rounded shoulders   SENSATION:          Light touch: R C8 diminished, LE WNL   Cervical ROM   ROM ROM  07/08/2022  Flexion N*  Extension N*  Right lateral flexion N*  Left lateral flexion N*  Right rotation N*  Left rotation N*  Flexion rotation (normal is 30 degrees)    Flexion rotation (normal is 30 degrees)      (Blank rows = not tested, N = WNL, * = concordant pain)     LUMBAR AROM   AROM AROM  07/08/2022  Flexion Fingertips to mid shin (limited by ~25%), w/ concordant pain   Extension limited by 25%, w/ concordant pain  Right lateral flexion WNL, w/ concordant pain  Left lateral flexion WNL, w/ concordant pain  Right rotation WNL  Left rotation WNL    (Blank rows = not tested)     LUMBAR SPECIAL TESTS:  Slump: L (-), R (-)   UPPER EXTREMITY MMT:   MMT Right 07/08/2022 Left 07/08/2022  Shoulder flexion 3+* 3+*  Shoulder abduction (C5) c C  Shoulder ER 4* 4*  Shoulder IR      Middle trapezius      Lower trapezius      Shoulder extension      Grip strength      Cervical flexion (C1,C2)      Cervical S/B (C3)      Shoulder shrug (C4) c c  Elbow flexion (C6) c c  Elbow ext (C7) c c  Thumb ext (C8) c c  Finger abd (T1) c c  Grossly        (Blank rows = not tested, score listed is out of 5 possible points.  N = WNL, D = diminished, C = clear for gross weakness with myotome testing, * = concordant pain with testing)     LE MMT:   MMT Right 07/08/2022 Left 07/08/2022  Hip flexion (L2, L3) 4* 4*  Knee extension (L3) 4* 3+*  Knee flexion 4* 4*  Hip abduction      Hip extension      Hip external rotation      Hip internal rotation      Hip adduction      Ankle dorsiflexion (L4) c c  Ankle plantarflexion (S1) c c  Ankle inversion      Ankle eversion      Great Toe ext (L5) c c  Grossly        (Blank rows = not tested, score listed is out of 5 possible points.  N = WNL, D = diminished, C = clear for gross weakness with myotome testing, * = concordant pain with testing)      PALPATION:            Significant TTP bil UT L>R and bil lumbar paraspinals; general hyper-sensitively to pressure with MMT   PATIENT SURVEYS:  Modified Oswestry 31/50      TODAY'S TREATMENT  [facility] Adult PT Treatment:  DATE: 07/24/2022 Aquatic therapy at [facility]- [address] - therapeutic pool temp 92 degrees Pt enters building ambulating independently  Treatment took place in water 3.8 to  4 ft 8 in.feet  deep depending upon activity.  Pt entered and exited the pool via stair and handrails independently  Pt pain level 8.5/10 at initiation of water walking.  Patient entered water for aquatic therapy for first time and was introduced to principles and therapeutic effects of water as they ambulated and acclimated to pool.  Therapeutic Exercise: Walking forward/backwards/side stepping Lunge stance with pink bell shoulder ab/adduction, horizontal ab/adductions x20 eachBIL At edge of pool, pt performed LE exercise: Hip abd/add x20 BIL Hip ext/flex with knee straight x 20 BIL Hip Circles CC/CCW 2x10 each BIL Marching hip flexion to knee extension 2x10 BIL Hamstring curl x20 BIL Squats 2x20 Sitting on noodle Bicycle kicks x1' Reverse bicycle kicks x1' Flutter kicks x1' Scissor kicks x1'  [facility] Adult PT Treatment:                                                DATE: 07/21/22 Therapeutic Exercise: Supine OH flexion with inspiration 15x Supine alternating OH flexion 15/15 Supine hor abd YTB 15x Supine alt. Marching 15/15 Seated hamstring stretch Supine QL stretch 30s x2 B PPT 3s 15x 90/90 30s x2 Open book 10/10  PATIENT EDUCATION:  POC, diagnosis, prognosis, HEP, and outcome measures.  Pt educated via explanation, demonstration, and handout (HEP).  Pt confirms understanding verbally.    HOME EXERCISE PROGRAM: Access Code: [email or web address] URL: [email or web address] Date: 07/07/2022 Prepared by: Alphonzo Severance   Exercises - Seated Scapular Retraction  - 1 x daily - 7 x weekly - 3 sets - 10 reps - Supine Lower Trunk Rotation  - 1 x daily - 7 x weekly - 1 sets - 20 reps - 3 hold   ASTERISK SIGNS     Asterisk Signs Eval (07/08/2022)            Pain 6-8/10            Shoulder flexion MMT 3+ bil            L knee ext MMT 3+                                              ASSESSMENT:   CLINICAL IMPRESSION:  Patient presents to first aquatic PT session reporting 8.5/10 pain  mostly in her lower back and in BIL LE. She reports HEP compliance, but that the exercises have been difficult. Session today focused on BIL LE strengthening and general conditioning in the aquatic environment for use of buoyancy to offload joints and the viscosity of water as resistance during therapeutic exercise. Patient was able to tolerate all prescribed exercises in the aquatic environment with no adverse effects. Patient continues to benefit from skilled PT services on land and aquatic based and should be progressed as able to improve functional independence.    OBJECTIVE IMPAIRMENTS: Pain, LE and UE strength   ACTIVITY LIMITATIONS: lifting, bending, housework, schoolwork    PERSONAL FACTORS: See medical history and pertinent history     REHAB POTENTIAL: Good   CLINICAL DECISION MAKING: Stable/uncomplicated   EVALUATION COMPLEXITY: Low  GOALS:     SHORT TERM GOALS: Target date: 08/05/2022   Joniece will be >75% HEP compliant to improve carryover between sessions and facilitate independent management of condition   Evaluation (07/08/2022): ongoing Goal status: INITIAL     LONG TERM GOALS: Target date: 09/02/2022   [place] will show a >/= 12 pt improvement in their ODI score (MCID is 12 pts) as a proxy for functional improvement    Evaluation/Baseline (07/08/2022): 31/50 Goal status: INITIAL     2.  [place] will self report >/= 50% decrease in pain from evaluation    Evaluation/Baseline (07/08/2022): 6-8/10 pain Goal status: INITIAL     3.  Mekaila will improve the following MMTs to >/= 4/5 to show improvement in strength:      Evaluation/Baseline (07/08/2022):    UPPER EXTREMITY MMT:   MMT Right 07/08/2022 Left 07/08/2022  Shoulder flexion 3+* 3+*  Shoulder abduction (C5) c C  Shoulder ER 4* 4*  Shoulder IR      Middle trapezius      Lower trapezius      Shoulder extension      Grip strength      Cervical flexion (C1,C2)      Cervical S/B (C3)       Shoulder shrug (C4) c c  Elbow flexion (C6) c c  Elbow ext (C7) c c  Thumb ext (C8) c c  Finger abd (T1) c c  Grossly        (Blank rows = not tested, score listed is out of 5 possible points.  N = WNL, D = diminished, C = clear for gross weakness with myotome testing, * = concordant pain with testing)     LE MMT:   MMT Right 07/08/2022 Left 07/08/2022  Hip flexion (L2, L3) 4* 4*  Knee extension (L3) 4* 3+*  Knee flexion 4* 4*  Hip abduction      Hip extension      Hip external rotation      Hip internal rotation      Hip adduction      Ankle dorsiflexion (L4) c c  Ankle plantarflexion (S1) c c  Ankle inversion      Ankle eversion      Great Toe ext (L5) c c  Grossly        (Blank rows = not tested, score listed is out of 5 possible points.  N = WNL, D = diminished, C = clear for gross weakness with myotome testing, * = concordant pain with testing)   Goal status: INITIAL     4.  Margret will report confidence in self management of condition at time of discharge with advanced HEP   Evaluation/Baseline (07/08/2022): unable to self manage Goal status: INITIAL       PLAN: PT FREQUENCY: 1-2x/week   PT DURATION: 8 weeks (Ending 09/02/2022)   PLANNED INTERVENTIONS: Therapeutic exercises, Aquatic therapy, Therapeutic activity, Neuro Muscular re-education, Gait training, Patient/Family education, Joint mobilization, Dry Needling, Electrical stimulation, Spinal mobilization and/or manipulation, Moist heat, Taping, Vasopneumatic device, Ionotophoresis 4mg /ml Dexamethasone, and Manual therapy   PLAN FOR NEXT SESSION: graded exposure, PNE, aquatic therapy   Margarette Canada, PTA 07/24/2022, 4:17 PM

## 2022-07-24 ENCOUNTER — Ambulatory Visit: Payer: Medicaid Other

## 2022-07-24 DIAGNOSIS — M542 Cervicalgia: Secondary | ICD-10-CM

## 2022-07-24 DIAGNOSIS — M5459 Other low back pain: Secondary | ICD-10-CM

## 2022-07-24 DIAGNOSIS — M6281 Muscle weakness (generalized): Secondary | ICD-10-CM

## 2022-07-27 ENCOUNTER — Encounter: Payer: Self-pay | Admitting: Physical Therapy

## 2022-07-27 ENCOUNTER — Ambulatory Visit: Payer: Medicaid Other | Admitting: Physical Therapy

## 2022-07-27 DIAGNOSIS — M5459 Other low back pain: Secondary | ICD-10-CM

## 2022-07-27 DIAGNOSIS — M6281 Muscle weakness (generalized): Secondary | ICD-10-CM

## 2022-07-27 DIAGNOSIS — M542 Cervicalgia: Secondary | ICD-10-CM

## 2022-07-27 NOTE — Therapy (Signed)
OUTPATIENT PHYSICAL THERAPY TREATMENT NOTE   Patient Name: Nena Hampe MRN: [id number] DOB:December 20, 2000, 22 y.o., female Today's Date: 07/27/2022  PCP: Reece Leader, DO  REFERRING PROVIDER: Levonne Lapping, NP   END OF SESSION:   PT End of Session - 07/27/22 1458     Visit Number 4    Number of Visits --   1-2x/week   Date for PT Re-Evaluation 09/02/22    Authorization Type healthy blue - ODI    PT Start Time 1459    PT Stop Time 1543    PT Time Calculation (min) 44 min    Activity Tolerance Patient tolerated treatment well;Patient limited by pain    Behavior During Therapy [facility] for tasks assessed/performed               Past Medical History:  Diagnosis Date   Anemia    Anxiety    Chlamydia 11/13/2021   Constipation, chronic    Depression    Environmental allergies    Ganglion cyst of dorsum of left wrist    RECURRENT   GERD (gastroesophageal reflux disease)    watches diet   Headache    Hypertension    IBS (irritable bowel syndrome)    Lactose intolerance    Past Surgical History:  Procedure Laterality Date   GANGLION CYST EXCISION Left 04/24/2021   Procedure: left recurrent dorsal carpal ganglion excision;  Surgeon: Gomez Cleverly, MD;  Location: [facility] [place];  Service: Orthopedics;  Laterality: Left;   INCISION AND DRAINAGE PERIRECTAL ABSCESS N/A 03/14/2022   Procedure: IRRIGATION AND DEBRIDEMENT PERIRECTAL ABSCESS;  Surgeon: Karie Soda, MD;  Location: [facility];  Service: General;  Laterality: N/A;   TONSILLECTOMY AND ADENOIDECTOMY     AGE 34   WISDOM TOOTH EXTRACTION  03/18/2021   WRIST GANGLION EXCISION Left 02/2020   Patient Active Problem List   Diagnosis Date Noted   Rectal abscess supralevator s/p I&D 03/14/2022 03/14/2022   Rectal pain    UTI (urinary tract infection) 03/10/2022   Rectal bleeding    Anal fissure    Abnormal CT scan, colon    Proctocolitis 03/09/2022   Chlamydia 11/18/2021   Menorrhagia with irregular cycle  11/18/2021   Dysmenorrhea 11/18/2021   Chronic anemia 11/17/2021   Lower abdominal pain 11/07/2021   Moderate episode of recurrent major depressive disorder (HCC) 06/23/2021   Low back pain 05/02/2021   Headache 09/03/2020   Nonspecific abdominal pain 08/30/2020   Ganglion cyst of dorsum of left wrist 11/03/2019   Constipation, chronic 01/21/2018   Irregular periods 01/21/2018   Generalized anxiety disorder 01/21/2018    REFERRING DIAG: Back pain [M54.9], Neck pain [M54.2]    THERAPY DIAG: Other low back pain - Plan: PT plan of care cert/re-cert   Muscle weakness (generalized) - Plan: PT plan of care cert/re-cert   Cervicalgia - Plan: PT plan of care cert/re-cert  Rationale for Evaluation and Treatment Rehabilitation  PERTINENT HISTORY: Anxiety, anemia, migraines   PRECAUTIONS: None   SUBJECTIVE:  SUBJECTIVE STATEMENT:   Pt states she did have some back and leg pain after aquatic therapy but felt better than being on land. Pt reports significant pelvic/LE pain today but states that this is typical for her, has fairly constant pain. Reports good adherence to HEP with less reps, denies any significant improvement.   PAIN:  Are you having pain? Yes: NPRS scale: 9/10 Pain location: low back and legs primarily Pain description: ache Aggravating factors: activity, stair climbing Relieving factors: nothing reported   OBJECTIVE: (objective measures completed at initial evaluation unless otherwise dated)   DIAGNOSTIC FINDINGS:  Nerve conduction - normal of LE   GENERAL OBSERVATION/GAIT:           Forward head, rounded shoulders   SENSATION:          Light touch: R C8 diminished, LE WNL   Cervical ROM   ROM ROM  07/08/2022  Flexion N*  Extension N*  Right lateral flexion N*  Left lateral flexion  N*  Right rotation N*  Left rotation N*  Flexion rotation (normal is 30 degrees)    Flexion rotation (normal is 30 degrees)      (Blank rows = not tested, N = WNL, * = concordant pain)     LUMBAR AROM   AROM AROM  07/08/2022  Flexion Fingertips to mid shin (limited by ~25%), w/ concordant pain  Extension limited by 25%, w/ concordant pain  Right lateral flexion WNL, w/ concordant pain  Left lateral flexion WNL, w/ concordant pain  Right rotation WNL  Left rotation WNL    (Blank rows = not tested)     LUMBAR SPECIAL TESTS:  Slump: L (-), R (-)   UPPER EXTREMITY MMT:   MMT Right 07/08/2022 Left 07/08/2022  Shoulder flexion 3+* 3+*  Shoulder abduction (C5) c C  Shoulder ER 4* 4*  Shoulder IR      Middle trapezius      Lower trapezius      Shoulder extension      Grip strength      Cervical flexion (C1,C2)      Cervical S/B (C3)      Shoulder shrug (C4) c c  Elbow flexion (C6) c c  Elbow ext (C7) c c  Thumb ext (C8) c c  Finger abd (T1) c c  Grossly        (Blank rows = not tested, score listed is out of 5 possible points.  N = WNL, D = diminished, C = clear for gross weakness with myotome testing, * = concordant pain with testing)     LE MMT:   MMT Right 07/08/2022 Left 07/08/2022  Hip flexion (L2, L3) 4* 4*  Knee extension (L3) 4* 3+*  Knee flexion 4* 4*  Hip abduction      Hip extension      Hip external rotation      Hip internal rotation      Hip adduction      Ankle dorsiflexion (L4) c c  Ankle plantarflexion (S1) c c  Ankle inversion      Ankle eversion      Great Toe ext (L5) c c  Grossly        (Blank rows = not tested, score listed is out of 5 possible points.  N = WNL, D = diminished, C = clear for gross weakness with myotome testing, * = concordant pain with testing)      PALPATION:  Significant TTP bil UT L>R and bil lumbar paraspinals; general hyper-sensitively to pressure with MMT   PATIENT SURVEYS:  Modified Oswestry  31/50      07/27/22 vitals:  HR 99-102, SpO2 97%, BP unable to get reading (although likely confounded by thick sweatshirt + cuff size)  TODAY'S TREATMENT   [facility] Adult PT Treatment:                                                DATE: 07/27/22 Therapeutic Exercise: Hooklying swiss ball press down w/ inspiration x15 cues for form and breath control Hooklying adductor iso x15 cues for breath control and appropriate force production Supine horizontal abduction w/ core activation YTB 2x10 Hooklying marches 2x15 each LE cues for core activation and breath control Seated RTB hip abduction 2x10 cues for form and pacing, posture    [facility] Adult PT Treatment:                                                DATE: 07/24/2022 Aquatic therapy at [facility]- [address] - therapeutic pool temp 92 degrees Pt enters building ambulating independently  Treatment took place in water 3.8 to  4 ft 8 in.feet deep depending upon activity.  Pt entered and exited the pool via stair and handrails independently  Pt pain level 8.5/10 at initiation of water walking.  Patient entered water for aquatic therapy for first time and was introduced to principles and therapeutic effects of water as they ambulated and acclimated to pool.  Therapeutic Exercise: Walking forward/backwards/side stepping Lunge stance with pink bell shoulder ab/adduction, horizontal ab/adductions x20 eachBIL At edge of pool, pt performed LE exercise: Hip abd/add x20 BIL Hip ext/flex with knee straight x 20 BIL Hip Circles CC/CCW 2x10 each BIL Marching hip flexion to knee extension 2x10 BIL Hamstring curl x20 BIL Squats 2x20 Sitting on noodle Bicycle kicks x1' Reverse bicycle kicks x1' Flutter kicks x1' Scissor kicks x1'  [facility] Adult PT Treatment:                                                DATE: 07/21/22 Therapeutic Exercise: Supine OH flexion with inspiration 15x Supine alternating OH flexion 15/15 Supine hor abd YTB 15x Supine alt.  Marching 15/15 Seated hamstring stretch Supine QL stretch 30s x2 B PPT 3s 15x 90/90 30s x2 Open book 10/10  PATIENT EDUCATION:  Rationale for interventions, monitoring symptoms, checking BP at home, communicating with provider   HOME EXERCISE PROGRAM: Access Code: [email or web address] URL: [email or web address] Date: 07/07/2022 Prepared by: Alphonzo Severance   Exercises - Seated Scapular Retraction  - 1 x daily - 7 x weekly - 3 sets - 10 reps - Supine Lower Trunk Rotation  - 1 x daily - 7 x weekly - 1 sets - 20 reps - 3 hold   ASTERISK SIGNS     Asterisk Signs Eval (07/08/2022)            Pain 6-8/10            Shoulder flexion MMT 3+ bil  L knee ext MMT 3+                                              ASSESSMENT:   CLINICAL IMPRESSION:  Pt arrives w/ 9/10 pain, states she felt better with aquatic therapy vs land therapy. Today's session continues to emphasize gentle core activation as tolerated, continues to endorse some discomfort with all activity that does not worsen as session goes on. Education provided throughout on graded exposure, monitoring symptoms/tolerance, etc.  Primary report of muscular fatigue and consistent pain levels. Upon return to sitting after supine exercises pt endorses some mild dizziness - HR/SpO2 stable as above, unable to get BP reading (likely confounded by cuff + thick sweatshirt). Dizziness improves w/ time, upon discussion pt notes these episodes began about a month ago and occur randomly, seem to be increasing in frequency. Pt notes she has not discussed these symptoms with any providers previously - encouraged pt to notify her provider, also discussed use of BP cuff at home to get a sense of typical readings for her. Pt verbalizes good understanding and agreement. Pt departs today's session in no acute distress, all voiced questions/concerns addressed appropriately from PT perspective.       OBJECTIVE IMPAIRMENTS: Pain, LE and UE  strength   ACTIVITY LIMITATIONS: lifting, bending, housework, schoolwork    PERSONAL FACTORS: See medical history and pertinent history     REHAB POTENTIAL: Good   CLINICAL DECISION MAKING: Stable/uncomplicated   EVALUATION COMPLEXITY: Low     GOALS:     SHORT TERM GOALS: Target date: 08/05/2022   Vidya will be >75% HEP compliant to improve carryover between sessions and facilitate independent management of condition   Evaluation (07/08/2022): ongoing Goal status: INITIAL     LONG TERM GOALS: Target date: 09/02/2022   Daneille will show a >/= 12 pt improvement in their ODI score (MCID is 12 pts) as a proxy for functional improvement    Evaluation/Baseline (07/08/2022): 31/50 Goal status: INITIAL     2.  Yeimi will self report >/= 50% decrease in pain from evaluation    Evaluation/Baseline (07/08/2022): 6-8/10 pain Goal status: INITIAL     3.  Denene will improve the following MMTs to >/= 4/5 to show improvement in strength:      Evaluation/Baseline (07/08/2022):    UPPER EXTREMITY MMT:   MMT Right 07/08/2022 Left 07/08/2022  Shoulder flexion 3+* 3+*  Shoulder abduction (C5) c C  Shoulder ER 4* 4*  Shoulder IR      Middle trapezius      Lower trapezius      Shoulder extension      Grip strength      Cervical flexion (C1,C2)      Cervical S/B (C3)      Shoulder shrug (C4) c c  Elbow flexion (C6) c c  Elbow ext (C7) c c  Thumb ext (C8) c c  Finger abd (T1) c c  Grossly        (Blank rows = not tested, score listed is out of 5 possible points.  N = WNL, D = diminished, C = clear for gross weakness with myotome testing, * = concordant pain with testing)     LE MMT:   MMT Right 07/08/2022 Left 07/08/2022  Hip flexion (L2, L3) 4* 4*  Knee extension (L3) 4* 3+*  Knee flexion  4* 4*  Hip abduction      Hip extension      Hip external rotation      Hip internal rotation      Hip adduction      Ankle dorsiflexion (L4) c c  Ankle plantarflexion  (S1) c c  Ankle inversion      Ankle eversion      Great Toe ext (L5) c c  Grossly        (Blank rows = not tested, score listed is out of 5 possible points.  N = WNL, D = diminished, C = clear for gross weakness with myotome testing, * = concordant pain with testing)   Goal status: INITIAL     4.  Felicity will report confidence in self management of condition at time of discharge with advanced HEP   Evaluation/Baseline (07/08/2022): unable to self manage Goal status: INITIAL       PLAN: PT FREQUENCY: 1-2x/week   PT DURATION: 8 weeks (Ending 09/02/2022)   PLANNED INTERVENTIONS: Therapeutic exercises, Aquatic therapy, Therapeutic activity, Neuro Muscular re-education, Gait training, Patient/Family education, Joint mobilization, Dry Needling, Electrical stimulation, Spinal mobilization and/or manipulation, Moist heat, Taping, Vasopneumatic device, Ionotophoresis 4mg /ml Dexamethasone, and Manual therapy   PLAN FOR NEXT SESSION:  graded exposure, PNE, aquatic therapy. Monitor vitals as appropriate   PT, DPT 07/27/2022 3:52 PM

## 2022-07-30 NOTE — Therapy (Signed)
OUTPATIENT PHYSICAL THERAPY TREATMENT NOTE   Patient Name: Jacqueline Gonzalez MRN: [id number] DOB:06-12-2001, 22 y.o., female Today's Date: 07/31/2022  PCP: Donney Dice, DO  REFERRING PROVIDER: Dianna Rossetti, NP   END OF SESSION:   PT End of Session - 07/31/22 1513     Visit Number 5    Date for PT Re-Evaluation 09/02/22    Authorization Type healthy blue - ODI    PT Start Time 1515    PT Stop Time 1600    PT Time Calculation (min) 45 min    Activity Tolerance Patient tolerated treatment well;Patient limited by pain    Behavior During Therapy [facility] for tasks assessed/performed              Past Medical History:  Diagnosis Date   Anemia    Anxiety    Chlamydia 11/13/2021   Constipation, chronic    Depression    Environmental allergies    Ganglion cyst of dorsum of left wrist    RECURRENT   GERD (gastroesophageal reflux disease)    watches diet   Headache    Hypertension    IBS (irritable bowel syndrome)    Lactose intolerance    Past Surgical History:  Procedure Laterality Date   GANGLION CYST EXCISION Left 04/24/2021   Procedure: left recurrent dorsal carpal ganglion excision;  Surgeon: [name], MD;  Location: [place];  Service: Orthopedics;  Laterality: Left;   INCISION AND DRAINAGE PERIRECTAL ABSCESS N/A 03/14/2022   Procedure: IRRIGATION AND DEBRIDEMENT PERIRECTAL ABSCESS;  Surgeon: Michael Boston, MD;  Location: [facility];  Service: General;  Laterality: N/A;   TONSILLECTOMY AND ADENOIDECTOMY     AGE 75   WISDOM TOOTH EXTRACTION  03/18/2021   WRIST GANGLION EXCISION Left 02/2020   Patient Active Problem List   Diagnosis Date Noted   Rectal abscess supralevator s/p I&D 03/14/2022 03/14/2022   Rectal pain    UTI (urinary tract infection) 03/10/2022   Rectal bleeding    Anal fissure    Abnormal CT scan, colon    Proctocolitis 03/09/2022   Chlamydia 11/18/2021   Menorrhagia with irregular cycle 11/18/2021   Dysmenorrhea 11/18/2021    Chronic anemia 11/17/2021   Lower abdominal pain 11/07/2021   Moderate episode of recurrent major depressive disorder ([place]) 06/23/2021   Low back pain 05/02/2021   Headache 09/03/2020   Nonspecific abdominal pain 08/30/2020   Ganglion cyst of dorsum of left wrist 11/03/2019   Constipation, chronic 01/21/2018   Irregular periods 01/21/2018   Generalized anxiety disorder 01/21/2018    REFERRING DIAG: Back pain [M54.9], Neck pain [M54.2]    THERAPY DIAG: Other low back pain - Plan: PT plan of care cert/re-cert   Muscle weakness (generalized) - Plan: PT plan of care cert/re-cert   Cervicalgia - Plan: PT plan of care cert/re-cert  Rationale for Evaluation and Treatment Rehabilitation  PERTINENT HISTORY: Anxiety, anemia, migraines   PRECAUTIONS: None   SUBJECTIVE:  SUBJECTIVE STATEMENT:  Patient reports continued pain at baseline levels, states she has continued dizziness and has an appointment with her doctor in February.   PAIN:  Are you having pain? Yes: NPRS scale: 8/10 Pain location: low back and legs primarily Pain description: ache Aggravating factors: activity, stair climbing Relieving factors: nothing reported   OBJECTIVE: (objective measures completed at initial evaluation unless otherwise dated)   DIAGNOSTIC FINDINGS:  Nerve conduction - normal of LE   GENERAL OBSERVATION/GAIT:           Forward head, rounded shoulders   SENSATION:          Light touch: R C8 diminished, LE WNL   Cervical ROM   ROM ROM  07/08/2022  Flexion N*  Extension N*  Right lateral flexion N*  Left lateral flexion N*  Right rotation N*  Left rotation N*  Flexion rotation (normal is 30 degrees)    Flexion rotation (normal is 30 degrees)      (Blank rows = not tested, N = WNL, * = concordant pain)      LUMBAR AROM   AROM AROM  07/08/2022  Flexion Fingertips to mid shin (limited by ~25%), w/ concordant pain  Extension limited by 25%, w/ concordant pain  Right lateral flexion WNL, w/ concordant pain  Left lateral flexion WNL, w/ concordant pain  Right rotation WNL  Left rotation WNL    (Blank rows = not tested)     LUMBAR SPECIAL TESTS:  Slump: L (-), R (-)   UPPER EXTREMITY MMT:   MMT Right 07/08/2022 Left 07/08/2022  Shoulder flexion 3+* 3+*  Shoulder abduction (C5) c C  Shoulder ER 4* 4*  Shoulder IR      Middle trapezius      Lower trapezius      Shoulder extension      Grip strength      Cervical flexion (C1,C2)      Cervical S/B (C3)      Shoulder shrug (C4) c c  Elbow flexion (C6) c c  Elbow ext (C7) c c  Thumb ext (C8) c c  Finger abd (T1) c c  Grossly        (Blank rows = not tested, score listed is out of 5 possible points.  N = WNL, D = diminished, C = clear for gross weakness with myotome testing, * = concordant pain with testing)     LE MMT:   MMT Right 07/08/2022 Left 07/08/2022  Hip flexion (L2, L3) 4* 4*  Knee extension (L3) 4* 3+*  Knee flexion 4* 4*  Hip abduction      Hip extension      Hip external rotation      Hip internal rotation      Hip adduction      Ankle dorsiflexion (L4) c c  Ankle plantarflexion (S1) c c  Ankle inversion      Ankle eversion      Great Toe ext (L5) c c  Grossly        (Blank rows = not tested, score listed is out of 5 possible points.  N = WNL, D = diminished, C = clear for gross weakness with myotome testing, * = concordant pain with testing)      PALPATION:            Significant TTP bil UT L>R and bil lumbar paraspinals; general hyper-sensitively to pressure with MMT   PATIENT SURVEYS:  Modified Oswestry 31/50  07/27/22 vitals:  HR 99-102, SpO2 97%, BP unable to get reading (although likely confounded by thick sweatshirt + cuff size)  TODAY'S TREATMENT  [facility] Adult PT Treatment:                                                 DATE: 07/31/2022 Aquatic therapy at [place] lap pool temp  approximately 87 degrees Pt enters building ambulating independently  Treatment took place in water 3.8 to  4 ft 8 in.feet deep depending upon activity.  Pt entered and exited the pool via stair and handrails independently   Therapeutic Exercise: Walking forward/backwards/side stepping Warrior I with pool noodle lift x10 BIL (pain/fatigue in L shoulder) Warrior II with noodle x30" BIL Wide stance with pink bell shoulder flexion/extension, ab/adductions x20 each BIL At edge of pool, pt performed LE exercise: Hip abd/add x20 BIL Hip extension x20 BIL Hip ext/flex with knee straight x 20 BIL Hip Circles CC/CCW x10 each BIL Marching hip flexion to knee extension 2x10 BIL Hamstring curl x20 BIL Squats 2x20 Sitting on step: Bicycle kicks x1' Reverse bicycle kicks x1' Flutter kicks x1' Scissor kicks x1'  Pt requires the buoyancy of water for active assisted exercises with buoyancy supported for strengthening and AROM exercises. Hydrostatic pressure also supports joints by unweighting joint load by at least 50 % in 3-4 feet depth water. 80% in chest to neck deep water. Water will provide assistance with movement using the current and laminar flow while the buoyancy reduces weight bearing. Pt requires the viscosity of the water for resistance with strengthening exercises.   [facility] Adult PT Treatment:                                                DATE: 07/27/22 Therapeutic Exercise: Hooklying swiss ball press down w/ inspiration x15 cues for form and breath control Hooklying adductor iso x15 cues for breath control and appropriate force production Supine horizontal abduction w/ core activation YTB 2x10 Hooklying marches 2x15 each LE cues for core activation and breath control Seated RTB hip abduction 2x10 cues for form and pacing, posture    [facility] Adult PT Treatment:                                                 DATE: 07/24/2022 Aquatic therapy at [address] - therapeutic pool temp 92 degrees Pt enters building ambulating independently  Treatment took place in water 3.8 to  4 ft 8 in.feet deep depending upon activity.  Pt entered and exited the pool via stair and handrails independently  Pt pain level 8.5/10 at initiation of water walking.  Patient entered water for aquatic therapy for first time and was introduced to principles and therapeutic effects of water as they ambulated and acclimated to pool.  Therapeutic Exercise: Walking forward/backwards/side stepping Lunge stance with pink bell shoulder ab/adduction, horizontal ab/adductions x20 eachBIL At edge of pool, pt performed LE exercise: Hip abd/add x20 BIL Hip ext/flex with knee straight x 20 BIL Hip Circles CC/CCW 2x10 each BIL Marching hip  flexion to knee extension 2x10 BIL Hamstring curl x20 BIL Squats 2x20 At end of exercises O2: 96% HR 92 bpm Sitting on noodle Bicycle kicks x1' Reverse bicycle kicks x1' Flutter kicks x1' Scissor kicks x1'  [facility] Adult PT Treatment:                                                DATE: 07/21/22 Therapeutic Exercise: Supine OH flexion with inspiration 15x Supine alternating OH flexion 15/15 Supine hor abd YTB 15x Supine alt. Marching 15/15 Seated hamstring stretch Supine QL stretch 30s x2 B PPT 3s 15x 90/90 30s x2 Open book 10/10  PATIENT EDUCATION:  Rationale for interventions, monitoring symptoms, checking BP at home, communicating with provider   HOME EXERCISE PROGRAM: Access Code: [email or web address] URL: [email or web address] Date: 07/07/2022 Prepared by: Alphonzo Severance   Exercises - Seated Scapular Retraction  - 1 x daily - 7 x weekly - 3 sets - 10 reps - Supine Lower Trunk Rotation  - 1 x daily - 7 x weekly - 1 sets - 20 reps - 3 hold   ASTERISK SIGNS     Asterisk Signs Eval (07/08/2022)            Pain 6-8/10             Shoulder flexion MMT 3+ bil            L knee ext MMT 3+                                              ASSESSMENT:   CLINICAL IMPRESSION:  Patient presents to aquatic PT session reporting continued baseline pain. Session today focused on gentle LE, core, and UE strengthening in the aquatic environment for use of buoyancy to offload joints and the viscosity of water as resistance during therapeutic exercise. Towards end of session she reports slight SOB, stats above. She reports baseline dizziness at beginning of session that she attributes to having a headache and also mentions that she sees her MD in February and intends to mention her dizziness. Patient continues to benefit from skilled PT services on land and aquatic based and should be progressed as able to improve functional independence.    OBJECTIVE IMPAIRMENTS: Pain, LE and UE strength   ACTIVITY LIMITATIONS: lifting, bending, housework, schoolwork    PERSONAL FACTORS: See medical history and pertinent history     REHAB POTENTIAL: Good   CLINICAL DECISION MAKING: Stable/uncomplicated   EVALUATION COMPLEXITY: Low     GOALS:     SHORT TERM GOALS: Target date: 08/05/2022   Jacqueline Gonzalez will be >75% HEP compliant to improve carryover between sessions and facilitate independent management of condition   Evaluation (07/08/2022): ongoing Goal status: INITIAL     LONG TERM GOALS: Target date: 09/02/2022   Jacqueline Gonzalez will show a >/= 12 pt improvement in their ODI score (MCID is 12 pts) as a proxy for functional improvement    Evaluation/Baseline (07/08/2022): 31/50 Goal status: INITIAL     2.  Jacqueline Gonzalez will self report >/= 50% decrease in pain from evaluation    Evaluation/Baseline (07/08/2022): 6-8/10 pain Goal status: INITIAL     3.  Jacqueline Gonzalez will improve the following MMTs to >/= 4/5 to show improvement in  strength:      Evaluation/Baseline (07/08/2022):    UPPER EXTREMITY MMT:   MMT Right 07/08/2022 Left 07/08/2022   Shoulder flexion 3+* 3+*  Shoulder abduction (C5) c C  Shoulder ER 4* 4*  Shoulder IR      Middle trapezius      Lower trapezius      Shoulder extension      Grip strength      Cervical flexion (C1,C2)      Cervical S/B (C3)      Shoulder shrug (C4) c c  Elbow flexion (C6) c c  Elbow ext (C7) c c  Thumb ext (C8) c c  Finger abd (T1) c c  Grossly        (Blank rows = not tested, score listed is out of 5 possible points.  N = WNL, D = diminished, C = clear for gross weakness with myotome testing, * = concordant pain with testing)     LE MMT:   MMT Right 07/08/2022 Left 07/08/2022  Hip flexion (L2, L3) 4* 4*  Knee extension (L3) 4* 3+*  Knee flexion 4* 4*  Hip abduction      Hip extension      Hip external rotation      Hip internal rotation      Hip adduction      Ankle dorsiflexion (L4) c c  Ankle plantarflexion (S1) c c  Ankle inversion      Ankle eversion      Great Toe ext (L5) c c  Grossly        (Blank rows = not tested, score listed is out of 5 possible points.  N = WNL, D = diminished, C = clear for gross weakness with myotome testing, * = concordant pain with testing)   Goal status: INITIAL     4.  Jacqueline Gonzalez will report confidence in self management of condition at time of discharge with advanced HEP   Evaluation/Baseline (07/08/2022): unable to self manage Goal status: INITIAL       PLAN: PT FREQUENCY: 1-2x/week   PT DURATION: 8 weeks (Ending 09/02/2022)   PLANNED INTERVENTIONS: Therapeutic exercises, Aquatic therapy, Therapeutic activity, Neuro Muscular re-education, Gait training, Patient/Family education, Joint mobilization, Dry Needling, Electrical stimulation, Spinal mobilization and/or manipulation, Moist heat, Taping, Vasopneumatic device, Ionotophoresis 4mg /ml Dexamethasone, and Manual therapy   PLAN FOR NEXT SESSION:  graded exposure, PNE, aquatic therapy. Monitor vitals as appropriate   Margarette Canada, PTA 07/31/22 3:57 PM

## 2022-07-31 ENCOUNTER — Ambulatory Visit: Payer: Medicaid Other

## 2022-07-31 DIAGNOSIS — M6281 Muscle weakness (generalized): Secondary | ICD-10-CM

## 2022-07-31 DIAGNOSIS — M5459 Other low back pain: Secondary | ICD-10-CM | POA: Diagnosis not present

## 2022-07-31 DIAGNOSIS — M542 Cervicalgia: Secondary | ICD-10-CM

## 2022-08-03 ENCOUNTER — Ambulatory Visit: Payer: Medicaid Other | Admitting: Physical Therapy

## 2022-08-03 NOTE — Therapy (Incomplete)
OUTPATIENT PHYSICAL THERAPY TREATMENT NOTE   Patient Name: Jacqueline Gonzalez MRN: [id number] DOB:02-05-2001, 22 y.o., female Today's Date: 08/03/2022  PCP: Reece Leader, DO  REFERRING PROVIDER: Levonne Lapping, NP   END OF SESSION:      Past Medical History:  Diagnosis Date   Anemia    Anxiety    Chlamydia 11/13/2021   Constipation, chronic    Depression    Environmental allergies    Ganglion cyst of dorsum of left wrist    RECURRENT   GERD (gastroesophageal reflux disease)    watches diet   Headache    Hypertension    IBS (irritable bowel syndrome)    Lactose intolerance    Past Surgical History:  Procedure Laterality Date   GANGLION CYST EXCISION Left 04/24/2021   Procedure: left recurrent dorsal carpal ganglion excision;  Surgeon: Gomez Cleverly, MD;  Location: [facility];  Service: Orthopedics;  Laterality: Left;   INCISION AND DRAINAGE PERIRECTAL ABSCESS N/A 03/14/2022   Procedure: IRRIGATION AND DEBRIDEMENT PERIRECTAL ABSCESS;  Surgeon: Karie Soda, MD;  Location: [facility];  Service: General;  Laterality: N/A;   TONSILLECTOMY AND ADENOIDECTOMY     AGE 35   WISDOM TOOTH EXTRACTION  03/18/2021   WRIST GANGLION EXCISION Left 02/2020   Patient Active Problem List   Diagnosis Date Noted   Rectal abscess supralevator s/p I&D 03/14/2022 03/14/2022   Rectal pain    UTI (urinary tract infection) 03/10/2022   Rectal bleeding    Anal fissure    Abnormal CT scan, colon    Proctocolitis 03/09/2022   Chlamydia 11/18/2021   Menorrhagia with irregular cycle 11/18/2021   Dysmenorrhea 11/18/2021   Chronic anemia 11/17/2021   Lower abdominal pain 11/07/2021   Moderate episode of recurrent major depressive disorder (HCC) 06/23/2021   Low back pain 05/02/2021   Headache 09/03/2020   Nonspecific abdominal pain 08/30/2020   Ganglion cyst of dorsum of left wrist 11/03/2019   Constipation, chronic 01/21/2018   Irregular periods 01/21/2018   Generalized anxiety  disorder 01/21/2018    REFERRING DIAG: Back pain [M54.9], Neck pain [M54.2]    THERAPY DIAG: Other low back pain - Plan: PT plan of care cert/re-cert   Muscle weakness (generalized) - Plan: PT plan of care cert/re-cert   Cervicalgia - Plan: PT plan of care cert/re-cert  Rationale for Evaluation and Treatment Rehabilitation  PERTINENT HISTORY: Anxiety, anemia, migraines   PRECAUTIONS: None   SUBJECTIVE:                                                                                                                                                                                 SUBJECTIVE STATEMENT:   ***  ***  Patient reports continued pain at baseline levels, states she has continued dizziness and has an appointment with her doctor in February.   PAIN:  Are you having pain? Yes: NPRS scale: *** 8/10 Pain location: low back and legs primarily Pain description: ache Aggravating factors: activity, stair climbing Relieving factors: nothing reported   OBJECTIVE: (objective measures completed at initial evaluation unless otherwise dated)   DIAGNOSTIC FINDINGS:  Nerve conduction - normal of LE   GENERAL OBSERVATION/GAIT:           Forward head, rounded shoulders   SENSATION:          Light touch: R C8 diminished, LE WNL   Cervical ROM   ROM ROM  07/08/2022  Flexion N*  Extension N*  Right lateral flexion N*  Left lateral flexion N*  Right rotation N*  Left rotation N*  Flexion rotation (normal is 30 degrees)    Flexion rotation (normal is 30 degrees)      (Blank rows = not tested, N = WNL, * = concordant pain)     LUMBAR AROM   AROM AROM  07/08/2022  Flexion Fingertips to mid shin (limited by ~25%), w/ concordant pain  Extension limited by 25%, w/ concordant pain  Right lateral flexion WNL, w/ concordant pain  Left lateral flexion WNL, w/ concordant pain  Right rotation WNL  Left rotation WNL    (Blank rows = not tested)     LUMBAR SPECIAL TESTS:   Slump: L (-), R (-)   UPPER EXTREMITY MMT:   MMT Right 07/08/2022 Left 07/08/2022  Shoulder flexion 3+* 3+*  Shoulder abduction (C5) c C  Shoulder ER 4* 4*  Shoulder IR      Middle trapezius      Lower trapezius      Shoulder extension      Grip strength      Cervical flexion (C1,C2)      Cervical S/B (C3)      Shoulder shrug (C4) c c  Elbow flexion (C6) c c  Elbow ext (C7) c c  Thumb ext (C8) c c  Finger abd (T1) c c  Grossly        (Blank rows = not tested, score listed is out of 5 possible points.  N = WNL, D = diminished, C = clear for gross weakness with myotome testing, * = concordant pain with testing)     LE MMT:   MMT Right 07/08/2022 Left 07/08/2022  Hip flexion (L2, L3) 4* 4*  Knee extension (L3) 4* 3+*  Knee flexion 4* 4*  Hip abduction      Hip extension      Hip external rotation      Hip internal rotation      Hip adduction      Ankle dorsiflexion (L4) c c  Ankle plantarflexion (S1) c c  Ankle inversion      Ankle eversion      Great Toe ext (L5) c c  Grossly        (Blank rows = not tested, score listed is out of 5 possible points.  N = WNL, D = diminished, C = clear for gross weakness with myotome testing, * = concordant pain with testing)      PALPATION:            Significant TTP bil UT L>R and bil lumbar paraspinals; general hyper-sensitively to pressure with MMT   PATIENT SURVEYS:  Modified Oswestry 31/50  07/27/22 vitals:  HR 99-102, SpO2 97%, BP unable to get reading (although likely confounded by thick sweatshirt + cuff size)  TODAY'S TREATMENT  [facility] Adult PT Treatment:                                                DATE: 08/03/22 Therapeutic Exercise: *** Manual Therapy: *** Neuromuscular re-ed: *** Therapeutic Activity: *** Modalities: *** Self Care: ***   [facility] PT Treatment:                                                DATE: 07/31/2022 Aquatic therapy at [facility]- [address] - lap pool temp   approximately 87 degrees Pt enters building ambulating independently  Treatment took place in water 3.8 to  4 ft 8 in.feet deep depending upon activity.  Pt entered and exited the pool via stair and handrails independently   Therapeutic Exercise: Walking forward/backwards/side stepping Warrior I with pool noodle lift x10 BIL (pain/fatigue in L shoulder) Warrior II with noodle x30" BIL Wide stance with pink bell shoulder flexion/extension, ab/adductions x20 each BIL At edge of pool, pt performed LE exercise: Hip abd/add x20 BIL Hip extension x20 BIL Hip ext/flex with knee straight x 20 BIL Hip Circles CC/CCW x10 each BIL Marching hip flexion to knee extension 2x10 BIL Hamstring curl x20 BIL Squats 2x20 Sitting on step: Bicycle kicks x1' Reverse bicycle kicks x1' Flutter kicks x1' Scissor kicks x1'  Pt requires the buoyancy of water for active assisted exercises with buoyancy supported for strengthening and AROM exercises. Hydrostatic pressure also supports joints by unweighting joint load by at least 50 % in 3-4 feet depth water. 80% in chest to neck deep water. Water will provide assistance with movement using the current and laminar flow while the buoyancy reduces weight bearing. Pt requires the viscosity of the water for resistance with strengthening exercises.   [facility] Adult PT Treatment:                                                DATE: 07/27/22 Therapeutic Exercise: Hooklying swiss ball press down w/ inspiration x15 cues for form and breath control Hooklying adductor iso x15 cues for breath control and appropriate force production Supine horizontal abduction w/ core activation YTB 2x10 Hooklying marches 2x15 each LE cues for core activation and breath control Seated RTB hip abduction 2x10 cues for form and pacing, posture    [facility] Adult PT Treatment:                                                DATE: 07/24/2022 Aquatic therapy at [facility]- [address] - therapeutic pool  temp 92 degrees Pt enters building ambulating independently  Treatment took place in water 3.8 to  4 ft 8 in.feet deep depending upon activity.  Pt entered and exited the pool via stair and handrails independently  Pt pain level 8.5/10 at initiation of water walking.  Patient entered water for aquatic therapy for first time and was introduced to principles and therapeutic effects of water as they ambulated and acclimated to pool.  Therapeutic Exercise: Walking forward/backwards/side stepping Lunge stance with pink bell shoulder ab/adduction, horizontal ab/adductions x20 eachBIL At edge of pool, pt performed LE exercise: Hip abd/add x20 BIL Hip ext/flex with knee straight x 20 BIL Hip Circles CC/CCW 2x10 each BIL Marching hip flexion to knee extension 2x10 BIL Hamstring curl x20 BIL Squats 2x20 At end of exercises O2: 96% HR 92 bpm Sitting on noodle Bicycle kicks x1' Reverse bicycle kicks x1' Flutter kicks x1' Scissor kicks x1'  [facility] Adult PT Treatment:                                                DATE: 07/21/22 Therapeutic Exercise: Supine OH flexion with inspiration 15x Supine alternating OH flexion 15/15 Supine hor abd YTB 15x Supine alt. Marching 15/15 Seated hamstring stretch Supine QL stretch 30s x2 B PPT 3s 15x 90/90 30s x2 Open book 10/10  PATIENT EDUCATION:  *** Rationale for interventions, monitoring symptoms, checking BP at home, communicating with provider   HOME EXERCISE PROGRAM: Access Code: [email or web address] URL: [email or web address] Date: 07/07/2022 Prepared by: Shearon Balo   Exercises - Seated Scapular Retraction  - 1 x daily - 7 x weekly - 3 sets - 10 reps - Supine Lower Trunk Rotation  - 1 x daily - 7 x weekly - 1 sets - 20 reps - 3 hold   ASTERISK SIGNS     Asterisk Signs Eval (07/08/2022)            Pain 6-8/10            Shoulder flexion MMT 3+ bil            L knee ext MMT 3+                                               ASSESSMENT:   CLINICAL IMPRESSION:  ***   *** Patient presents to aquatic PT session reporting continued baseline pain. Session today focused on gentle LE, core, and UE strengthening in the aquatic environment for use of buoyancy to offload joints and the viscosity of water as resistance during therapeutic exercise. Towards end of session she reports slight SOB, stats above. She reports baseline dizziness at beginning of session that she attributes to having a headache and also mentions that she sees her MD in February and intends to mention her dizziness. Patient continues to benefit from skilled PT services on land and aquatic based and should be progressed as able to improve functional independence.    OBJECTIVE IMPAIRMENTS: Pain, LE and UE strength   ACTIVITY LIMITATIONS: lifting, bending, housework, schoolwork    PERSONAL FACTORS: See medical history and pertinent history     REHAB POTENTIAL: Good   CLINICAL DECISION MAKING: Stable/uncomplicated   EVALUATION COMPLEXITY: Low     GOALS:     SHORT TERM GOALS: Target date: 08/05/2022   Madelaine will be >75% HEP compliant to improve carryover between sessions and facilitate independent management of condition   Evaluation (07/08/2022): ongoing Goal status: INITIAL     LONG TERM GOALS: Target date: 09/02/2022   [facility]  will show a >/= 12 pt improvement in their ODI score (MCID is 12 pts) as a proxy for functional improvement    Evaluation/Baseline (07/08/2022): 31/50 Goal status: INITIAL     2.  Santasia will self report >/= 50% decrease in pain from evaluation    Evaluation/Baseline (07/08/2022): 6-8/10 pain Goal status: INITIAL     3.  Madalynne will improve the following MMTs to >/= 4/5 to show improvement in strength:      Evaluation/Baseline (07/08/2022):    UPPER EXTREMITY MMT:   MMT Right 07/08/2022 Left 07/08/2022  Shoulder flexion 3+* 3+*  Shoulder abduction (C5) c C  Shoulder ER 4* 4*  Shoulder IR       Middle trapezius      Lower trapezius      Shoulder extension      Grip strength      Cervical flexion (C1,C2)      Cervical S/B (C3)      Shoulder shrug (C4) c c  Elbow flexion (C6) c c  Elbow ext (C7) c c  Thumb ext (C8) c c  Finger abd (T1) c c  Grossly        (Blank rows = not tested, score listed is out of 5 possible points.  N = WNL, D = diminished, C = clear for gross weakness with myotome testing, * = concordant pain with testing)     LE MMT:   MMT Right 07/08/2022 Left 07/08/2022  Hip flexion (L2, L3) 4* 4*  Knee extension (L3) 4* 3+*  Knee flexion 4* 4*  Hip abduction      Hip extension      Hip external rotation      Hip internal rotation      Hip adduction      Ankle dorsiflexion (L4) c c  Ankle plantarflexion (S1) c c  Ankle inversion      Ankle eversion      Great Toe ext (L5) c c  Grossly        (Blank rows = not tested, score listed is out of 5 possible points.  N = WNL, D = diminished, C = clear for gross weakness with myotome testing, * = concordant pain with testing)   Goal status: INITIAL     4.  Janalyn will report confidence in self management of condition at time of discharge with advanced HEP   Evaluation/Baseline (07/08/2022): unable to self manage Goal status: INITIAL       PLAN: PT FREQUENCY: 1-2x/week   PT DURATION: 8 weeks (Ending 09/02/2022)   PLANNED INTERVENTIONS: Therapeutic exercises, Aquatic therapy, Therapeutic activity, Neuro Muscular re-education, Gait training, Patient/Family education, Joint mobilization, Dry Needling, Electrical stimulation, Spinal mobilization and/or manipulation, Moist heat, Taping, Vasopneumatic device, Ionotophoresis 4mg /ml Dexamethasone, and Manual therapy   PLAN FOR NEXT SESSION:  *** graded exposure, PNE, aquatic therapy. Monitor vitals as appropriate   PT, DPT 08/03/2022 8:34 AM

## 2022-08-06 NOTE — Therapy (Signed)
OUTPATIENT PHYSICAL THERAPY TREATMENT NOTE   Patient Name: Jacqueline Gonzalez MRN: [id number] DOB:2000-08-16, 22 y.o., female Today's Date: 08/07/2022  PCP: Donney Dice, DO  REFERRING PROVIDER: Dianna Rossetti, NP   END OF SESSION:   PT End of Session - 08/07/22 1513     Visit Number 6    Date for PT Re-Evaluation 09/02/22    Authorization Type healthy blue - ODI    PT Start Time 1515    PT Stop Time 1600    PT Time Calculation (min) 45 min    Activity Tolerance Patient tolerated treatment well;Patient limited by pain    Behavior During Therapy [facility] for tasks assessed/performed               Past Medical History:  Diagnosis Date   Anemia    Anxiety    Chlamydia 11/13/2021   Constipation, chronic    Depression    Environmental allergies    Ganglion cyst of dorsum of left wrist    RECURRENT   GERD (gastroesophageal reflux disease)    watches diet   Headache    Hypertension    IBS (irritable bowel syndrome)    Lactose intolerance    Past Surgical History:  Procedure Laterality Date   GANGLION CYST EXCISION Left 04/24/2021   Procedure: left recurrent dorsal carpal ganglion excision;  Surgeon: [name], MD;  Location: [place];  Service: Orthopedics;  Laterality: Left;   INCISION AND DRAINAGE PERIRECTAL ABSCESS N/A 03/14/2022   Procedure: IRRIGATION AND DEBRIDEMENT PERIRECTAL ABSCESS;  Surgeon: Michael Boston, MD;  Location: [facility];  Service: General;  Laterality: N/A;   TONSILLECTOMY AND ADENOIDECTOMY     AGE 55   WISDOM TOOTH EXTRACTION  03/18/2021   WRIST GANGLION EXCISION Left 02/2020   Patient Active Problem List   Diagnosis Date Noted   Rectal abscess supralevator s/p I&D 03/14/2022 03/14/2022   Rectal pain    UTI (urinary tract infection) 03/10/2022   Rectal bleeding    Anal fissure    Abnormal CT scan, colon    Proctocolitis 03/09/2022   Chlamydia 11/18/2021   Menorrhagia with irregular cycle 11/18/2021   Dysmenorrhea 11/18/2021    Chronic anemia 11/17/2021   Lower abdominal pain 11/07/2021   Moderate episode of recurrent major depressive disorder ([place]) 06/23/2021   Low back pain 05/02/2021   Headache 09/03/2020   Nonspecific abdominal pain 08/30/2020   Ganglion cyst of dorsum of left wrist 11/03/2019   Constipation, chronic 01/21/2018   Irregular periods 01/21/2018   Generalized anxiety disorder 01/21/2018    REFERRING DIAG: Back pain [M54.9], Neck pain [M54.2]    THERAPY DIAG: Other low back pain - Plan: PT plan of care cert/re-cert   Muscle weakness (generalized) - Plan: PT plan of care cert/re-cert   Cervicalgia - Plan: PT plan of care cert/re-cert  Rationale for Evaluation and Treatment Rehabilitation  PERTINENT HISTORY: Anxiety, anemia, migraines   PRECAUTIONS: None   SUBJECTIVE:  SUBJECTIVE STATEMENT: Patient repots her LLE is hurting more than her lower back today. She state the dizziness is the same.  PAIN:  Are you having pain? Yes: NPRS scale: 8/10 Pain location: low back and legs primarily Pain description: ache Aggravating factors: activity, stair climbing Relieving factors: nothing reported   OBJECTIVE: (objective measures completed at initial evaluation unless otherwise dated)   DIAGNOSTIC FINDINGS:  Nerve conduction - normal of LE   GENERAL OBSERVATION/GAIT:           Forward head, rounded shoulders   SENSATION:          Light touch: R C8 diminished, LE WNL   Cervical ROM   ROM ROM  07/08/2022  Flexion N*  Extension N*  Right lateral flexion N*  Left lateral flexion N*  Right rotation N*  Left rotation N*  Flexion rotation (normal is 30 degrees)    Flexion rotation (normal is 30 degrees)      (Blank rows = not tested, N = WNL, * = concordant pain)     LUMBAR AROM   AROM AROM  07/08/2022   Flexion Fingertips to mid shin (limited by ~25%), w/ concordant pain  Extension limited by 25%, w/ concordant pain  Right lateral flexion WNL, w/ concordant pain  Left lateral flexion WNL, w/ concordant pain  Right rotation WNL  Left rotation WNL    (Blank rows = not tested)     LUMBAR SPECIAL TESTS:  Slump: L (-), R (-)   UPPER EXTREMITY MMT:   MMT Right 07/08/2022 Left 07/08/2022  Shoulder flexion 3+* 3+*  Shoulder abduction (C5) c C  Shoulder ER 4* 4*  Shoulder IR      Middle trapezius      Lower trapezius      Shoulder extension      Grip strength      Cervical flexion (C1,C2)      Cervical S/B (C3)      Shoulder shrug (C4) c c  Elbow flexion (C6) c c  Elbow ext (C7) c c  Thumb ext (C8) c c  Finger abd (T1) c c  Grossly        (Blank rows = not tested, score listed is out of 5 possible points.  N = WNL, D = diminished, C = clear for gross weakness with myotome testing, * = concordant pain with testing)     LE MMT:   MMT Right 07/08/2022 Left 07/08/2022  Hip flexion (L2, L3) 4* 4*  Knee extension (L3) 4* 3+*  Knee flexion 4* 4*  Hip abduction      Hip extension      Hip external rotation      Hip internal rotation      Hip adduction      Ankle dorsiflexion (L4) c c  Ankle plantarflexion (S1) c c  Ankle inversion      Ankle eversion      Great Toe ext (L5) c c  Grossly        (Blank rows = not tested, score listed is out of 5 possible points.  N = WNL, D = diminished, C = clear for gross weakness with myotome testing, * = concordant pain with testing)      PALPATION:            Significant TTP bil UT L>R and bil lumbar paraspinals; general hyper-sensitively to pressure with MMT   PATIENT SURVEYS:  Modified Oswestry 31/50      07/27/22  vitals:  HR 99-102, SpO2 97%, BP unable to get reading (although likely confounded by thick sweatshirt + cuff size)  TODAY'S TREATMENT  [facility] Adult PT Treatment:                                                DATE:  08/06/2022 Aquatic therapy at [facility] [address] - therapy pool temp  approximately 92degrees Pt enters building ambulating independently  Treatment took place in water 3.8 to  4 ft 8 in.feet deep depending upon activity.  Pt entered and exited the pool via stair and handrails independently   Therapeutic Exercise: Walking forward/backwards/side stepping Ai Chi postures 1-8, cues for diaphragmatic breathing, education on parasympathetic vs sympathetic nervous system, pain modulation Wide stance pink bell shoulder extension/flexion, horizontal abduction/adduction x20 each At edge of pool, pt performed LE exercise: Hip abd/add x20 BIL Hip extension x20 BIL Hip ext/flex with knee straight x 20 BIL Hip Circles CC/CCW x10 each BIL Marching hip flexion to knee extension 20 BIL Hamstring curl x20 BIL Squats 2x20 Sitting on big square blue noodle: Bicycle kick Reverse bicycle kick Flutter kick Scissor kick Cross country skiiers  Pt requires the buoyancy of water for active assisted exercises with buoyancy supported for strengthening and AROM exercises. Hydrostatic pressure also supports joints by unweighting joint load by at least 50 % in 3-4 feet depth water. 80% in chest to neck deep water. Water will provide assistance with movement using the current and laminar flow while the buoyancy reduces weight bearing. Pt requires the viscosity of the water for resistance with strengthening exercises.  [facility] Adult PT Treatment:                                                DATE: 07/31/2022 Aquatic therapy at [facility] [address] - lap pool temp  approximately 87 degrees Pt enters building ambulating independently  Treatment took place in water 3.8 to  4 ft 8 in.feet deep depending upon activity.  Pt entered and exited the pool via stair and handrails independently   Therapeutic Exercise: Walking forward/backwards/side stepping Warrior I with pool noodle lift x10 BIL (pain/fatigue  in L shoulder) Warrior II with noodle x30" BIL Wide stance with pink bell shoulder flexion/extension, ab/adductions x20 each BIL At edge of pool, pt performed LE exercise: Hip abd/add x20 BIL Hip extension x20 BIL Hip ext/flex with knee straight x 20 BIL Hip Circles CC/CCW x10 each BIL Marching hip flexion to knee extension 2x10 BIL Hamstring curl x20 BIL Squats 2x20 Sitting on step: Bicycle kicks x1' Reverse bicycle kicks x1' Flutter kicks x1' Scissor kicks x1'  Pt requires the buoyancy of water for active assisted exercises with buoyancy supported for strengthening and AROM exercises. Hydrostatic pressure also supports joints by unweighting joint load by at least 50 % in 3-4 feet depth water. 80% in chest to neck deep water. Water will provide assistance with movement using the current and laminar flow while the buoyancy reduces weight bearing. Pt requires the viscosity of the water for resistance with strengthening exercises.   [facility] Adult PT Treatment:  DATE: 07/27/22 Therapeutic Exercise: Hooklying swiss ball press down w/ inspiration x15 cues for form and breath control Hooklying adductor iso x15 cues for breath control and appropriate force production Supine horizontal abduction w/ core activation YTB 2x10 Hooklying marches 2x15 each LE cues for core activation and breath control Seated RTB hip abduction 2x10 cues for form and pacing, posture     PATIENT EDUCATION:  Rationale for interventions, monitoring symptoms, checking BP at home, communicating with provider   HOME EXERCISE PROGRAM: Access Code: [email or web address] URL: [email or web address] Date: 07/07/2022 Prepared by: Shearon Balo   Exercises - Seated Scapular Retraction  - 1 x daily - 7 x weekly - 3 sets - 10 reps - Supine Lower Trunk Rotation  - 1 x daily - 7 x weekly - 1 sets - 20 reps - 3 hold   ASTERISK SIGNS     Asterisk Signs Eval (07/08/2022)             Pain 6-8/10            Shoulder flexion MMT 3+ bil            L knee ext MMT 3+                                              ASSESSMENT:   CLINICAL IMPRESSION:  Patient presents to aquatic PT session reporting high pain in her distal LLE today. Introduced [place] this session with focus on diaphragmatic breathing to induce parasympathetic nervous sytem activation for pain modulation. Session today focused on LE strengthening, general conditioning and pain modulation in the aquatic environment for use of buoyancy to offload joints and the viscosity of water as resistance during therapeutic exercise. Patient was able to tolerate all prescribed exercises in the aquatic environment with no adverse effects. Patient continues to benefit from skilled PT services on land and aquatic based and should be progressed as able to improve functional independence.    OBJECTIVE IMPAIRMENTS: Pain, LE and UE strength   ACTIVITY LIMITATIONS: lifting, bending, housework, schoolwork    PERSONAL FACTORS: See medical history and pertinent history     REHAB POTENTIAL: Good   CLINICAL DECISION MAKING: Stable/uncomplicated   EVALUATION COMPLEXITY: Low     GOALS:     SHORT TERM GOALS: Target date: 08/05/2022   Brylin will be >75% HEP compliant to improve carryover between sessions and facilitate independent management of condition   Evaluation (07/08/2022): ongoing Goal status: INITIAL     LONG TERM GOALS: Target date: 09/02/2022   Caylyn will show a >/= 12 pt improvement in their ODI score (MCID is 12 pts) as a proxy for functional improvement    Evaluation/Baseline (07/08/2022): 31/50 Goal status: INITIAL     2.  Shalana will self report >/= 50% decrease in pain from evaluation    Evaluation/Baseline (07/08/2022): 6-8/10 pain Goal status: INITIAL     3.  Berna will improve the following MMTs to >/= 4/5 to show improvement in strength:      Evaluation/Baseline (07/08/2022):     UPPER EXTREMITY MMT:   MMT Right 07/08/2022 Left 07/08/2022  Shoulder flexion 3+* 3+*  Shoulder abduction (C5) c C  Shoulder ER 4* 4*  Shoulder IR      Middle trapezius      Lower trapezius      Shoulder extension      Grip  strength      Cervical flexion (C1,C2)      Cervical S/B (C3)      Shoulder shrug (C4) c c  Elbow flexion (C6) c c  Elbow ext (C7) c c  Thumb ext (C8) c c  Finger abd (T1) c c  Grossly        (Blank rows = not tested, score listed is out of 5 possible points.  N = WNL, D = diminished, C = clear for gross weakness with myotome testing, * = concordant pain with testing)     LE MMT:   MMT Right 07/08/2022 Left 07/08/2022  Hip flexion (L2, L3) 4* 4*  Knee extension (L3) 4* 3+*  Knee flexion 4* 4*  Hip abduction      Hip extension      Hip external rotation      Hip internal rotation      Hip adduction      Ankle dorsiflexion (L4) c c  Ankle plantarflexion (S1) c c  Ankle inversion      Ankle eversion      Great Toe ext (L5) c c  Grossly        (Blank rows = not tested, score listed is out of 5 possible points.  N = WNL, D = diminished, C = clear for gross weakness with myotome testing, * = concordant pain with testing)   Goal status: INITIAL     4.  Tacy will report confidence in self management of condition at time of discharge with advanced HEP   Evaluation/Baseline (07/08/2022): unable to self manage Goal status: INITIAL       PLAN: PT FREQUENCY: 1-2x/week   PT DURATION: 8 weeks (Ending 09/02/2022)   PLANNED INTERVENTIONS: Therapeutic exercises, Aquatic therapy, Therapeutic activity, Neuro Muscular re-education, Gait training, Patient/Family education, Joint mobilization, Dry Needling, Electrical stimulation, Spinal mobilization and/or manipulation, Moist heat, Taping, Vasopneumatic device, Ionotophoresis 4mg /ml Dexamethasone, and Manual therapy   PLAN FOR NEXT SESSION:  graded exposure, PNE, aquatic therapy. Monitor vitals as  appropriate   , PTA 08/07/22 3:57 PM

## 2022-08-07 ENCOUNTER — Ambulatory Visit: Payer: Medicaid Other

## 2022-08-07 DIAGNOSIS — M5459 Other low back pain: Secondary | ICD-10-CM | POA: Diagnosis not present

## 2022-08-07 DIAGNOSIS — M542 Cervicalgia: Secondary | ICD-10-CM

## 2022-08-07 DIAGNOSIS — M6281 Muscle weakness (generalized): Secondary | ICD-10-CM

## 2022-08-10 ENCOUNTER — Encounter: Payer: Self-pay | Admitting: Physical Therapy

## 2022-08-10 ENCOUNTER — Ambulatory Visit: Payer: Medicaid Other | Admitting: Physical Therapy

## 2022-08-10 DIAGNOSIS — M6281 Muscle weakness (generalized): Secondary | ICD-10-CM

## 2022-08-10 DIAGNOSIS — M5459 Other low back pain: Secondary | ICD-10-CM

## 2022-08-10 DIAGNOSIS — M542 Cervicalgia: Secondary | ICD-10-CM

## 2022-08-10 NOTE — Therapy (Signed)
OUTPATIENT PHYSICAL THERAPY TREATMENT NOTE   Patient Name: Jacqueline Gonzalez MRN: [id number] DOB:[birth year], 22 y.o., female Today's Date: 08/10/2022  PCP: Donney Dice, DO  REFERRING PROVIDER: Dianna Rossetti, NP   END OF SESSION:   PT End of Session - 08/10/22 1505     Visit Number 7    Number of Visits --   1-2x/week   Date for PT Re-Evaluation 09/02/22    Authorization Type healthy blue - ODI    PT Start Time 1506   late check in   PT Stop Time 1545    PT Time Calculation (min) 39 min    Activity Tolerance Patient tolerated treatment well;Patient limited by pain    Behavior During Therapy [facility] for tasks assessed/performed                Past Medical History:  Diagnosis Date   Anemia    Anxiety    Chlamydia 11/13/2021   Constipation, chronic    Depression    Environmental allergies    Ganglion cyst of dorsum of left wrist    RECURRENT   GERD (gastroesophageal reflux disease)    watches diet   Headache    Hypertension    IBS (irritable bowel syndrome)    Lactose intolerance    Past Surgical History:  Procedure Laterality Date   GANGLION CYST EXCISION Left 04/24/2021   Procedure: left recurrent dorsal carpal ganglion excision;  Surgeon: [name], MD;  Location: [place];  Service: Orthopedics;  Laterality: Left;   INCISION AND DRAINAGE PERIRECTAL ABSCESS N/A 03/14/2022   Procedure: IRRIGATION AND DEBRIDEMENT PERIRECTAL ABSCESS;  Surgeon: Michael Boston, MD;  Location: [facility];  Service: General;  Laterality: N/A;   TONSILLECTOMY AND ADENOIDECTOMY     AGE 21   WISDOM TOOTH EXTRACTION  03/18/2021   WRIST GANGLION EXCISION Left 02/2020   Patient Active Problem List   Diagnosis Date Noted   Rectal abscess supralevator s/p I&D 03/14/2022 03/14/2022   Rectal pain    UTI (urinary tract infection) 03/10/2022   Rectal bleeding    Anal fissure    Abnormal CT scan, colon    Proctocolitis 03/09/2022   Chlamydia 11/18/2021   Menorrhagia with  irregular cycle 11/18/2021   Dysmenorrhea 11/18/2021   Chronic anemia 11/17/2021   Lower abdominal pain 11/07/2021   Moderate episode of recurrent major depressive disorder ([place]) 06/23/2021   Low back pain 05/02/2021   Headache 09/03/2020   Nonspecific abdominal pain 08/30/2020   Ganglion cyst of dorsum of left wrist 11/03/2019   Constipation, chronic 01/21/2018   Irregular periods 01/21/2018   Generalized anxiety disorder 01/21/2018    REFERRING DIAG: Back pain [M54.9], Neck pain [M54.2]    THERAPY DIAG: Other low back pain - Plan: PT plan of care cert/re-cert   Muscle weakness (generalized) - Plan: PT plan of care cert/re-cert   Cervicalgia - Plan: PT plan of care cert/re-cert  Rationale for Evaluation and Treatment Rehabilitation  PERTINENT HISTORY: Anxiety, anemia, migraines   PRECAUTIONS: None   SUBJECTIVE:  SUBJECTIVE STATEMENT:  Pt arrives with report of 8.5/10 pain at present in neck and back. States she felt okay after pool, some soreness for about an hour afterwards. Limited adherence reported to HEP which pt states is due to pain levels. No changes in dizziness. Reports symptoms generally more irritable since return to school.   PAIN:  Are you having pain? Yes: NPRS scale:  8.5/10 Pain location: low back and legs primarily Pain description: ache Aggravating factors: activity, stair climbing Relieving factors: nothing reported   OBJECTIVE: (objective measures completed at initial evaluation unless otherwise dated)   DIAGNOSTIC FINDINGS:  Nerve conduction - normal of LE   GENERAL OBSERVATION/GAIT:           Forward head, rounded shoulders   SENSATION:          Light touch: R C8 diminished, LE WNL  08/10/22:  No clonus Negative hoffmann and tromner sign  No hyperreflexia     Cervical ROM   ROM ROM  07/08/2022  Flexion N*  Extension N*  Right lateral flexion N*  Left lateral flexion N*  Right rotation N*  Left rotation N*  Flexion rotation (normal is 30 degrees)    Flexion rotation (normal is 30 degrees)      (Blank rows = not tested, N = WNL, * = concordant pain)     LUMBAR AROM   AROM AROM  07/08/2022  Flexion Fingertips to mid shin (limited by ~25%), w/ concordant pain  Extension limited by 25%, w/ concordant pain  Right lateral flexion WNL, w/ concordant pain  Left lateral flexion WNL, w/ concordant pain  Right rotation WNL  Left rotation WNL    (Blank rows = not tested)     LUMBAR SPECIAL TESTS:  Slump: L (-), R (-)   UPPER EXTREMITY MMT:   MMT Right 07/08/2022 Left 07/08/2022  Shoulder flexion 3+* 3+*  Shoulder abduction (C5) c C  Shoulder ER 4* 4*  Shoulder IR      Middle trapezius      Lower trapezius      Shoulder extension      Grip strength      Cervical flexion (C1,C2)      Cervical S/B (C3)      Shoulder shrug (C4) c c  Elbow flexion (C6) c c  Elbow ext (C7) c c  Thumb ext (C8) c c  Finger abd (T1) c c  Grossly        (Blank rows = not tested, score listed is out of 5 possible points.  N = WNL, D = diminished, C = clear for gross weakness with myotome testing, * = concordant pain with testing)     LE MMT:   MMT Right 07/08/2022 Left 07/08/2022  Hip flexion (L2, L3) 4* 4*  Knee extension (L3) 4* 3+*  Knee flexion 4* 4*  Hip abduction      Hip extension      Hip external rotation      Hip internal rotation      Hip adduction      Ankle dorsiflexion (L4) c c  Ankle plantarflexion (S1) c c  Ankle inversion      Ankle eversion      Great Toe ext (L5) c c  Grossly        (Blank rows = not tested, score listed is out of 5 possible points.  N = WNL, D = diminished, C = clear for gross weakness with myotome testing, * =  concordant pain with testing)      PALPATION:            Significant TTP bil UT L>R  and bil lumbar paraspinals; general hyper-sensitively to pressure with MMT   PATIENT SURVEYS:  Modified Oswestry 31/50  Modified Oswestry 08/10/22 31/50      07/27/22 vitals:  HR 99-102, SpO2 97%, BP unable to get reading (although likely confounded by thick sweatshirt + cuff size)  TODAY'S TREATMENT  [facility] Adult PT Treatment:                                                DATE: 08/10/22 Therapeutic Exercise: Seated swiss ball press down 2x10 cues for appropriate force output Adductor iso 2x10 cues for appropriate force and pain levels  Seated RTB abd 2x10 cues for positioning and appropriate force output  Seated B ER w / RTB around wrist 2x10 cues for posture  Manual Therapy: Gentle STM/effleurage L UT/LS/deltoid, seated w/o relief  Therapeutic Activity: ODI + education MSK assessment + education Activity modification, pacing of activities, monitoring symptoms    [facility] Adult PT Treatment:                                                DATE: 08/06/2022 Aquatic therapy at [facility]- [address] - therapy pool temp  approximately 92degrees Pt enters building ambulating independently  Treatment took place in water 3.8 to  4 ft 8 in.feet deep depending upon activity.  Pt entered and exited the pool via stair and handrails independently   Therapeutic Exercise: Walking forward/backwards/side stepping Ai Chi postures 1-8, cues for diaphragmatic breathing, education on parasympathetic vs sympathetic nervous system, pain modulation Wide stance pink bell shoulder extension/flexion, horizontal abduction/adduction x20 each At edge of pool, pt performed LE exercise: Hip abd/add x20 BIL Hip extension x20 BIL Hip ext/flex with knee straight x 20 BIL Hip Circles CC/CCW x10 each BIL Marching hip flexion to knee extension 20 BIL Hamstring curl x20 BIL Squats 2x20 Sitting on big square blue noodle: Bicycle kick Reverse bicycle kick Flutter kick Scissor kick Cross country skiiers  Pt  requires the buoyancy of water for active assisted exercises with buoyancy supported for strengthening and AROM exercises. Hydrostatic pressure also supports joints by unweighting joint load by at least 50 % in 3-4 feet depth water. 80% in chest to neck deep water. Water will provide assistance with movement using the current and laminar flow while the buoyancy reduces weight bearing. Pt requires the viscosity of the water for resistance with strengthening exercises.  [facility]                                                DATE: 07/31/2022 Aquatic therapy at [facility] [address] - lap pool temp  approximately 87 degrees Pt enters building ambulating independently  Treatment took place in water 3.8 to  4 ft 8 in.feet deep depending upon activity.  Pt entered and exited the pool via stair and handrails independently   Therapeutic Exercise: Walking forward/backwards/side stepping Warrior I with pool  noodle lift x10 BIL (pain/fatigue in L shoulder) Warrior II with noodle x30" BIL Wide stance with pink bell shoulder flexion/extension, ab/adductions x20 each BIL At edge of pool, pt performed LE exercise: Hip abd/add x20 BIL Hip extension x20 BIL Hip ext/flex with knee straight x 20 BIL Hip Circles CC/CCW x10 each BIL Marching hip flexion to knee extension 2x10 BIL Hamstring curl x20 BIL Squats 2x20 Sitting on step: Bicycle kicks x1' Reverse bicycle kicks x1' Flutter kicks x1' Scissor kicks x1'  Pt requires the buoyancy of water for active assisted exercises with buoyancy supported for strengthening and AROM exercises. Hydrostatic pressure also supports joints by unweighting joint load by at least 50 % in 3-4 feet depth water. 80% in chest to neck deep water. Water will provide assistance with movement using the current and laminar flow while the buoyancy reduces weight bearing. Pt requires the viscosity of the water for resistance with strengthening exercises.   [facility]  Adult PT Treatment:                                                DATE: 07/27/22 Therapeutic Exercise: Hooklying swiss ball press down w/ inspiration x15 cues for form and breath control Hooklying adductor iso x15 cues for breath control and appropriate force production Supine horizontal abduction w/ core activation YTB 2x10 Hooklying marches 2x15 each LE cues for core activation and breath control Seated RTB hip abduction 2x10 cues for form and pacing, posture     PATIENT EDUCATION:  Rationale for interventions, monitoring symptoms and communicating with provider   HOME EXERCISE PROGRAM: Access Code: [email or web address] URL: [email or web address] Date: 07/07/2022 Prepared by: Alphonzo Severance   Exercises - Seated Scapular Retraction  - 1 x daily - 7 x weekly - 3 sets - 10 reps - Supine Lower Trunk Rotation  - 1 x daily - 7 x weekly - 1 sets - 20 reps - 3 hold   ASTERISK SIGNS     Asterisk Signs Eval (07/08/2022) 08/10/22           Pain 6-8/10 8-9/10 NPS           Shoulder flexion MMT 3+ bil  3+ BIL with pain          L knee ext MMT 3+  4 mild pain                                            ASSESSMENT:   CLINICAL IMPRESSION:  Pt arrives w/ 8.5/10 neck and back pain. Pt notes some increase in symptoms since being back in school. ODI scored at 31/50 which is unchanged compared to initial eval. MSK impairments as above, pt remains highly limited by pain. Attempted introduction of manual with no change in symptoms although concordant pain is reproduced by palpation of L UT/LS and deltoid, discontinued after . Exercise with cues as above, increased rest breaks required due to pain. Did not progress HEP as pt reports limited adherence due to pain, and limited tolerance to exercises performed in session. Encouraged follow up with provider given limited progress with PT thus far. No adverse events, pt reports 9/10 pain on departure. Pt departs today's session in no acute distress,  all voiced questions/concerns addressed appropriately from PT perspective.  OBJECTIVE IMPAIRMENTS: Pain, LE and UE strength   ACTIVITY LIMITATIONS: lifting, bending, housework, schoolwork    PERSONAL FACTORS: See medical history and pertinent history     REHAB POTENTIAL: Good   CLINICAL DECISION MAKING: Stable/uncomplicated   EVALUATION COMPLEXITY: Low     GOALS:     SHORT TERM GOALS: Target date: 08/05/2022   Annisa will be >75% HEP compliant to improve carryover between sessions and facilitate independent management of condition   Evaluation (07/08/2022): ongoing Goal status: INITIAL     LONG TERM GOALS: Target date: 09/02/2022   Anna-Marie will show a >/= 12 pt improvement in their ODI score (MCID is 12 pts) as a proxy for functional improvement    Evaluation/Baseline (07/08/2022): 31/50 Goal status: INITIAL     2.  Jeimy will self report >/= 50% decrease in pain from evaluation    Evaluation/Baseline (07/08/2022): 6-8/10 pain Goal status: INITIAL     3.  Hennessy will improve the following MMTs to >/= 4/5 to show improvement in strength:      Evaluation/Baseline (07/08/2022):    UPPER EXTREMITY MMT:   MMT Right 07/08/2022 Left 07/08/2022  Shoulder flexion 3+* 3+*  Shoulder abduction (C5) c C  Shoulder ER 4* 4*  Shoulder IR      Middle trapezius      Lower trapezius      Shoulder extension      Grip strength      Cervical flexion (C1,C2)      Cervical S/B (C3)      Shoulder shrug (C4) c c  Elbow flexion (C6) c c  Elbow ext (C7) c c  Thumb ext (C8) c c  Finger abd (T1) c c  Grossly        (Blank rows = not tested, score listed is out of 5 possible points.  N = WNL, D = diminished, C = clear for gross weakness with myotome testing, * = concordant pain with testing)     LE MMT:   MMT Right 07/08/2022 Left 07/08/2022  Hip flexion (L2, L3) 4* 4*  Knee extension (L3) 4* 3+*  Knee flexion 4* 4*  Hip abduction      Hip extension      Hip  external rotation      Hip internal rotation      Hip adduction      Ankle dorsiflexion (L4) c c  Ankle plantarflexion (S1) c c  Ankle inversion      Ankle eversion      Great Toe ext (L5) c c  Grossly        (Blank rows = not tested, score listed is out of 5 possible points.  N = WNL, D = diminished, C = clear for gross weakness with myotome testing, * = concordant pain with testing)   Goal status: INITIAL     4.  Dorien will report confidence in self management of condition at time of discharge with advanced HEP   Evaluation/Baseline (07/08/2022): unable to self manage Goal status: INITIAL       PLAN: PT FREQUENCY: 1-2x/week   PT DURATION: 8 weeks (Ending 09/02/2022)   PLANNED INTERVENTIONS: Therapeutic exercises, Aquatic therapy, Therapeutic activity, Neuro Muscular re-education, Gait training, Patient/Family education, Joint mobilization, Dry Needling, Electrical stimulation, Spinal mobilization and/or manipulation, Moist heat, Taping, Vasopneumatic device, Ionotophoresis 4mg /ml Dexamethasone, and Manual therapy   PLAN FOR NEXT SESSION:   graded exposure, PNE, aquatic therapy. Monitor vitals as appropriate   PT,  DPT 08/10/2022 3:50 PM

## 2022-08-13 NOTE — Therapy (Signed)
OUTPATIENT PHYSICAL THERAPY TREATMENT NOTE   Patient Name: Jacqueline Gonzalez MRN: [id number] DOB:[birth year], 22 y.o., female Today's Date: 08/14/2022  PCP: Donney Dice, DO  REFERRING PROVIDER: Dianna Rossetti, NP   END OF SESSION:   PT End of Session - 08/14/22 1517     Visit Number 8    Date for PT Re-Evaluation 09/02/22    Authorization Type healthy blue - ODI    Authorization Time Period Approved 8 visits 07/30/22-09/27/22    Authorization - Visit Number 4    Authorization - Number of Visits 8    PT Start Time 4975    PT Stop Time 1600    PT Time Calculation (min) 42 min    Activity Tolerance Patient tolerated treatment well;Patient limited by pain    Behavior During Therapy [facility] for tasks assessed/performed             Past Medical History:  Diagnosis Date   Anemia    Anxiety    Chlamydia 11/13/2021   Constipation, chronic    Depression    Environmental allergies    Ganglion cyst of dorsum of left wrist    RECURRENT   GERD (gastroesophageal reflux disease)    watches diet   Headache    Hypertension    IBS (irritable bowel syndrome)    Lactose intolerance    Past Surgical History:  Procedure Laterality Date   GANGLION CYST EXCISION Left 04/24/2021   Procedure: left recurrent dorsal carpal ganglion excision;  Surgeon: [name], MD;  Location: [facility];  Service: Orthopedics;  Laterality: Left;   INCISION AND DRAINAGE PERIRECTAL ABSCESS N/A 03/14/2022   Procedure: IRRIGATION AND DEBRIDEMENT PERIRECTAL ABSCESS;  Surgeon: Michael Boston, MD;  Location: [facility];  Service: General;  Laterality: N/A;   TONSILLECTOMY AND ADENOIDECTOMY     AGE 58   WISDOM TOOTH EXTRACTION  03/18/2021   WRIST GANGLION EXCISION Left 02/2020   Patient Active Problem List   Diagnosis Date Noted   Rectal abscess supralevator s/p I&D 03/14/2022 03/14/2022   Rectal pain    UTI (urinary tract infection) 03/10/2022   Rectal bleeding    Anal fissure    Abnormal CT scan,  colon    Proctocolitis 03/09/2022   Chlamydia 11/18/2021   Menorrhagia with irregular cycle 11/18/2021   Dysmenorrhea 11/18/2021   Chronic anemia 11/17/2021   Lower abdominal pain 11/07/2021   Moderate episode of recurrent major depressive disorder ([place]) 06/23/2021   Low back pain 05/02/2021   Headache 09/03/2020   Nonspecific abdominal pain 08/30/2020   Ganglion cyst of dorsum of left wrist 11/03/2019   Constipation, chronic 01/21/2018   Irregular periods 01/21/2018   Generalized anxiety disorder 01/21/2018    REFERRING DIAG: Back pain [M54.9], Neck pain [M54.2]    THERAPY DIAG: Other low back pain - Plan: PT plan of care cert/re-cert   Muscle weakness (generalized) - Plan: PT plan of care cert/re-cert   Cervicalgia - Plan: PT plan of care cert/re-cert  Rationale for Evaluation and Treatment Rehabilitation  PERTINENT HISTORY: Anxiety, anemia, migraines   PRECAUTIONS: None   SUBJECTIVE:  SUBJECTIVE STATEMENT:  Patient reports she has mostly abdominal and pelvic pain along with lower back and neck pain. She states that this is not new and does not relate to her monthly cycle.   PAIN:  Are you having pain? Yes: NPRS scale: 8/10 Pain location: low back and legs primarily Pain description: ache Aggravating factors: activity, stair climbing Relieving factors: nothing reported   OBJECTIVE: (objective measures completed at initial evaluation unless otherwise dated)   DIAGNOSTIC FINDINGS:  Nerve conduction - normal of LE   GENERAL OBSERVATION/GAIT:           Forward head, rounded shoulders   SENSATION:          Light touch: R C8 diminished, LE WNL  08/10/22:  No clonus Negative hoffmann and tromner sign  No hyperreflexia    Cervical ROM   ROM ROM  07/08/2022  Flexion N*  Extension N*   Right lateral flexion N*  Left lateral flexion N*  Right rotation N*  Left rotation N*  Flexion rotation (normal is 30 degrees)    Flexion rotation (normal is 30 degrees)      (Blank rows = not tested, N = WNL, * = concordant pain)     LUMBAR AROM   AROM AROM  07/08/2022  Flexion Fingertips to mid shin (limited by ~25%), w/ concordant pain  Extension limited by 25%, w/ concordant pain  Right lateral flexion WNL, w/ concordant pain  Left lateral flexion WNL, w/ concordant pain  Right rotation WNL  Left rotation WNL    (Blank rows = not tested)     LUMBAR SPECIAL TESTS:  Slump: L (-), R (-)   UPPER EXTREMITY MMT:   MMT Right 07/08/2022 Left 07/08/2022  Shoulder flexion 3+* 3+*  Shoulder abduction (C5) c C  Shoulder ER 4* 4*  Shoulder IR      Middle trapezius      Lower trapezius      Shoulder extension      Grip strength      Cervical flexion (C1,C2)      Cervical S/B (C3)      Shoulder shrug (C4) c c  Elbow flexion (C6) c c  Elbow ext (C7) c c  Thumb ext (C8) c c  Finger abd (T1) c c  Grossly        (Blank rows = not tested, score listed is out of 5 possible points.  N = WNL, D = diminished, C = clear for gross weakness with myotome testing, * = concordant pain with testing)     LE MMT:   MMT Right 07/08/2022 Left 07/08/2022  Hip flexion (L2, L3) 4* 4*  Knee extension (L3) 4* 3+*  Knee flexion 4* 4*  Hip abduction      Hip extension      Hip external rotation      Hip internal rotation      Hip adduction      Ankle dorsiflexion (L4) c c  Ankle plantarflexion (S1) c c  Ankle inversion      Ankle eversion      Great Toe ext (L5) c c  Grossly        (Blank rows = not tested, score listed is out of 5 possible points.  N = WNL, D = diminished, C = clear for gross weakness with myotome testing, * = concordant pain with testing)      PALPATION:            Significant  TTP bil UT L>R and bil lumbar paraspinals; general hyper-sensitively to pressure  with MMT   PATIENT SURVEYS:  Modified Oswestry 31/50  Modified Oswestry 08/10/22 31/50      07/27/22 vitals:  HR 99-102, SpO2 97%, BP unable to get reading (although likely confounded by thick sweatshirt + cuff size)  TODAY'S TREATMENT  [facility] Adult PT Treatment:                                                DATE: 08/14/2022 Therapeutic Exercise: Aquatic therapy at [facility] [address] - therapy pool temp  approximately 92 degrees Pt enters building ambulating independently  Treatment took place in water 3.8 to  4 ft 8 in.feet deep depending upon activity.  Pt entered and exited the pool via stair and handrails independently.  Therapeutic Exercise: Walking forward/backwards/side stepping Wide stance pink bell shoulder extension/flexion, horizontal abduction/adduction x20 each March walk with rainbow DB by sides x2 laps Sidestepping with rainbow DB shoulder ab/adduction x2 laps Runners stretch on bottom step x30" BIL At edge of pool, pt performed LE exercise: Hip abd/add x20 BIL Hip extension x20 BIL Hip ext/flex with knee straight x 20 BIL Hip Circles CC/CCW x10 each BIL Marching hip flexion to knee extension 20 BIL Hamstring curl x20 BIL Squats 2x20  Pt requires the buoyancy of water for active assisted exercises with buoyancy supported for strengthening and AROM exercises. Hydrostatic pressure also supports joints by unweighting joint load by at least 50 % in 3-4 feet depth water. 80% in chest to neck deep water. Water will provide assistance with movement using the current and laminar flow while the buoyancy reduces weight bearing. Pt requires the viscosity of the water for resistance with strengthening exercises.  [facility] Adult PT Treatment:                                                DATE: 08/10/22 Therapeutic Exercise: Seated swiss ball press down 2x10 cues for appropriate force output Adductor iso 2x10 cues for appropriate force and pain levels  Seated RTB abd 2x10  cues for positioning and appropriate force output  Seated B ER w / RTB around wrist 2x10 cues for posture  Manual Therapy: Gentle STM/effleurage L UT/LS/deltoid, seated w/o relief  Therapeutic Activity: ODI + education MSK assessment + education Activity modification, pacing of activities, monitoring symptoms    [facility] Adult PT Treatment:                                                DATE: 08/06/2022 Aquatic therapy at [facility] [address] - therapy pool temp  approximately 92degrees Pt enters building ambulating independently  Treatment took place in water 3.8 to  4 ft 8 in.feet deep depending upon activity.  Pt entered and exited the pool via stair and handrails independently   Therapeutic Exercise: Walking forward/backwards/side stepping Ai Chi postures 1-8, cues for diaphragmatic breathing, education on parasympathetic vs sympathetic nervous system, pain modulation Wide stance pink bell shoulder extension/flexion, horizontal abduction/adduction x20 each At edge of pool, pt performed LE exercise: Hip  abd/add x20 BIL Hip extension x20 BIL Hip ext/flex with knee straight x 20 BIL Hip Circles CC/CCW x10 each BIL Marching hip flexion to knee extension 20 BIL Hamstring curl x20 BIL Squats 2x20 Sitting on big square blue noodle: Bicycle kick Reverse bicycle kick Flutter kick Scissor kick Cross country skiiers  Pt requires the buoyancy of water for active assisted exercises with buoyancy supported for strengthening and AROM exercises. Hydrostatic pressure also supports joints by unweighting joint load by at least 50 % in 3-4 feet depth water. 80% in chest to neck deep water. Water will provide assistance with movement using the current and laminar flow while the buoyancy reduces weight bearing. Pt requires the viscosity of the water for resistance with strengthening exercises.    PATIENT EDUCATION:  Rationale for interventions, monitoring symptoms and communicating with  provider   HOME EXERCISE PROGRAM: Access Code: [email or web address] URL: [email or web address] Date: 07/07/2022 Prepared by: Shearon Balo   Exercises - Seated Scapular Retraction  - 1 x daily - 7 x weekly - 3 sets - 10 reps - Supine Lower Trunk Rotation  - 1 x daily - 7 x weekly - 1 sets - 20 reps - 3 hold   ASTERISK SIGNS     Asterisk Signs Eval (07/08/2022) 08/10/22           Pain 6-8/10 8-9/10 NPS           Shoulder flexion MMT 3+ bil  3+ BIL with pain          L knee ext MMT 3+  4 mild pain                                            ASSESSMENT:   CLINICAL IMPRESSION:  Patient presents to aquatic PT session reporting continued high levels of pain and that the last session was very difficult. Session today focused on general conditioning in the aquatic environment for use of buoyancy to offload joints and the viscosity of water as resistance during therapeutic exercise. Patient was able to tolerate all prescribed exercises in the aquatic environment with some limitation from pain throughout session. Patient continues to benefit from skilled PT services on land and aquatic based and should be progressed as able to improve functional independence.   OBJECTIVE IMPAIRMENTS: Pain, LE and UE strength   ACTIVITY LIMITATIONS: lifting, bending, housework, schoolwork    PERSONAL FACTORS: See medical history and pertinent history     REHAB POTENTIAL: Good   CLINICAL DECISION MAKING: Stable/uncomplicated   EVALUATION COMPLEXITY: Low     GOALS:     SHORT TERM GOALS: Target date: 08/05/2022   Keely will be >75% HEP compliant to improve carryover between sessions and facilitate independent management of condition   Evaluation (07/08/2022): ongoing Goal status: INITIAL     LONG TERM GOALS: Target date: 09/02/2022   Alanda will show a >/= 12 pt improvement in their ODI score (MCID is 12 pts) as a proxy for functional improvement    Evaluation/Baseline (07/08/2022):  31/50 Goal status: INITIAL     2.  Zoejane will self report >/= 50% decrease in pain from evaluation    Evaluation/Baseline (07/08/2022): 6-8/10 pain Goal status: INITIAL     3.  Yamileth will improve the following MMTs to >/= 4/5 to show improvement in strength:      Evaluation/Baseline (07/08/2022):    UPPER EXTREMITY MMT:  MMT Right 07/08/2022 Left 07/08/2022  Shoulder flexion 3+* 3+*  Shoulder abduction (C5) c C  Shoulder ER 4* 4*  Shoulder IR      Middle trapezius      Lower trapezius      Shoulder extension      Grip strength      Cervical flexion (C1,C2)      Cervical S/B (C3)      Shoulder shrug (C4) c c  Elbow flexion (C6) c c  Elbow ext (C7) c c  Thumb ext (C8) c c  Finger abd (T1) c c  Grossly        (Blank rows = not tested, score listed is out of 5 possible points.  N = WNL, D = diminished, C = clear for gross weakness with myotome testing, * = concordant pain with testing)     LE MMT:   MMT Right 07/08/2022 Left 07/08/2022  Hip flexion (L2, L3) 4* 4*  Knee extension (L3) 4* 3+*  Knee flexion 4* 4*  Hip abduction      Hip extension      Hip external rotation      Hip internal rotation      Hip adduction      Ankle dorsiflexion (L4) c c  Ankle plantarflexion (S1) c c  Ankle inversion      Ankle eversion      Great Toe ext (L5) c c  Grossly        (Blank rows = not tested, score listed is out of 5 possible points.  N = WNL, D = diminished, C = clear for gross weakness with myotome testing, * = concordant pain with testing)   Goal status: INITIAL     4.  Brailyn will report confidence in self management of condition at time of discharge with advanced HEP   Evaluation/Baseline (07/08/2022): unable to self manage Goal status: INITIAL       PLAN: PT FREQUENCY: 1-2x/week   PT DURATION: 8 weeks (Ending 09/02/2022)   PLANNED INTERVENTIONS: Therapeutic exercises, Aquatic therapy, Therapeutic activity, Neuro Muscular re-education, Gait  training, Patient/Family education, Joint mobilization, Dry Needling, Electrical stimulation, Spinal mobilization and/or manipulation, Moist heat, Taping, Vasopneumatic device, Ionotophoresis 4mg /ml Dexamethasone, and Manual therapy   PLAN FOR NEXT SESSION:   graded exposure, PNE, aquatic therapy. Monitor vitals as appropriate   , PTA 08/14/22 3:18 PM

## 2022-08-14 ENCOUNTER — Ambulatory Visit: Payer: Medicaid Other

## 2022-08-14 DIAGNOSIS — M6281 Muscle weakness (generalized): Secondary | ICD-10-CM

## 2022-08-14 DIAGNOSIS — M5459 Other low back pain: Secondary | ICD-10-CM | POA: Diagnosis not present

## 2022-08-14 DIAGNOSIS — M542 Cervicalgia: Secondary | ICD-10-CM

## 2022-08-17 ENCOUNTER — Ambulatory Visit: Payer: Medicaid Other | Admitting: Physical Therapy

## 2022-08-17 ENCOUNTER — Encounter: Payer: Self-pay | Admitting: Physical Therapy

## 2022-08-17 DIAGNOSIS — M6281 Muscle weakness (generalized): Secondary | ICD-10-CM

## 2022-08-17 DIAGNOSIS — M542 Cervicalgia: Secondary | ICD-10-CM

## 2022-08-17 DIAGNOSIS — M5459 Other low back pain: Secondary | ICD-10-CM | POA: Diagnosis not present

## 2022-08-17 NOTE — Therapy (Addendum)
OUTPATIENT PHYSICAL THERAPY TREATMENT NOTE + NO VISIT DISCHARGE ADDENDUM (see below)   Patient Name: Jacqueline Gonzalez MRN: [id number] DOB:[birth year], 22 y.o., female Today's Date: 08/17/2022  PCP: Donney Dice, DO  REFERRING PROVIDER: Dianna Rossetti, NP   END OF SESSION:   PT End of Session - 08/17/22 1508     Visit Number 9    Number of Visits --   1-2x/week   Date for PT Re-Evaluation 09/02/22    Authorization Type healthy blue - ODI    Authorization Time Period Approved 8 visits 07/30/22-09/27/22    Authorization - Visit Number 5    Authorization - Number of Visits 8    PT Start Time [id number]   late check in   PT Stop Time [id number]    PT Time Calculation (min) 33 min    Activity Tolerance Patient tolerated treatment well;No increased pain    Behavior During Therapy [facility] for tasks assessed/performed              Past Medical History:  Diagnosis Date   Anemia    Anxiety    Chlamydia 11/13/2021   Constipation, chronic    Depression    Environmental allergies    Ganglion cyst of dorsum of left wrist    RECURRENT   GERD (gastroesophageal reflux disease)    watches diet   Headache    Hypertension    IBS (irritable bowel syndrome)    Lactose intolerance    Past Surgical History:  Procedure Laterality Date   GANGLION CYST EXCISION Left 04/24/2021   Procedure: left recurrent dorsal carpal ganglion excision;  Surgeon: [name], MD;  Location: [place];  Service: Orthopedics;  Laterality: Left;   INCISION AND DRAINAGE PERIRECTAL ABSCESS N/A 03/14/2022   Procedure: IRRIGATION AND DEBRIDEMENT PERIRECTAL ABSCESS;  Surgeon: Michael Boston, MD;  Location: [facility];  Service: General;  Laterality: N/A;   TONSILLECTOMY AND ADENOIDECTOMY     AGE 83   WISDOM TOOTH EXTRACTION  03/18/2021   WRIST GANGLION EXCISION Left 02/2020   Patient Active Problem List   Diagnosis Date Noted   Rectal abscess supralevator s/p I&D 03/14/2022 03/14/2022   Rectal pain    UTI  (urinary tract infection) 03/10/2022   Rectal bleeding    Anal fissure    Abnormal CT scan, colon    Proctocolitis 03/09/2022   Chlamydia 11/18/2021   Menorrhagia with irregular cycle 11/18/2021   Dysmenorrhea 11/18/2021   Chronic anemia 11/17/2021   Lower abdominal pain 11/07/2021   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) 06/23/2021   Low back pain 05/02/2021   Headache 09/03/2020   Nonspecific abdominal pain 08/30/2020   Ganglion cyst of dorsum of left wrist 11/03/2019   Constipation, chronic 01/21/2018   Irregular periods 01/21/2018   Generalized anxiety disorder 01/21/2018    REFERRING DIAG: Back pain [M54.9], Neck pain [M54.2]    THERAPY DIAG: Other low back pain - Plan: PT plan of care cert/re-cert   Muscle weakness (generalized) - Plan: PT plan of care cert/re-cert   Cervicalgia - Plan: PT plan of care cert/re-cert  Rationale for Evaluation and Treatment Rehabilitation  PERTINENT HISTORY: Anxiety, anemia, migraines   PRECAUTIONS: None   SUBJECTIVE:  SUBJECTIVE STATEMENT:  Pt states that overall she is feeling about the same other than pelvic pain that seems like it has been worsening since hospitalization in August, sees OB GYN tomorrow.  Main area of pain is in the legs today. Saw PCP last week to discuss leg pain, headaches, and dizziness - states MD still wants her to do PT but is getting imaging of back and testing for RA, getting referred to ortho. Saw neurologist last week as well   PAIN:  Are you having pain? Yes: NPRS scale: 7/10 (mostly in legs) Pain location: low back and legs primarily Pain description: ache Aggravating factors: activity, stair climbing Relieving factors: nothing reported   OBJECTIVE: (objective measures completed at initial evaluation  unless otherwise dated)   DIAGNOSTIC FINDINGS:  Nerve conduction - normal of LE   GENERAL OBSERVATION/GAIT:           Forward head, rounded shoulders   SENSATION:          Light touch: R C8 diminished, LE WNL  08/10/22:  No clonus Negative hoffmann and tromner sign  No hyperreflexia    Cervical ROM   ROM ROM  07/08/2022  Flexion N*  Extension N*  Right lateral flexion N*  Left lateral flexion N*  Right rotation N*  Left rotation N*  Flexion rotation (normal is 30 degrees)    Flexion rotation (normal is 30 degrees)      (Blank rows = not tested, N = WNL, * = concordant pain)     LUMBAR AROM   AROM AROM  07/08/2022  Flexion Fingertips to mid shin (limited by ~25%), w/ concordant pain  Extension limited by 25%, w/ concordant pain  Right lateral flexion WNL, w/ concordant pain  Left lateral flexion WNL, w/ concordant pain  Right rotation WNL  Left rotation WNL    (Blank rows = not tested)     LUMBAR SPECIAL TESTS:  Slump: L (-), R (-)   UPPER EXTREMITY MMT:   MMT Right 07/08/2022 Left 07/08/2022  Shoulder flexion 3+* 3+*  Shoulder abduction (C5) c C  Shoulder ER 4* 4*  Shoulder IR      Middle trapezius      Lower trapezius      Shoulder extension      Grip strength      Cervical flexion (C1,C2)      Cervical S/B (C3)      Shoulder shrug (C4) c c  Elbow flexion (C6) c c  Elbow ext (C7) c c  Thumb ext (C8) c c  Finger abd (T1) c c  Grossly        (Blank rows = not tested, score listed is out of 5 possible points.  N = WNL, D = diminished, C = clear for gross weakness with myotome testing, * = concordant pain with testing)     LE MMT:   MMT Right 07/08/2022 Left 07/08/2022  Hip flexion (L2, L3) 4* 4*  Knee extension (L3) 4* 3+*  Knee flexion 4* 4*  Hip abduction      Hip extension      Hip external rotation      Hip internal rotation      Hip adduction      Ankle dorsiflexion (L4) c c  Ankle plantarflexion (S1) c c  Ankle inversion       Ankle eversion      Great Toe ext (L5) c c  Grossly        (Blank  rows = not tested, score listed is out of 5 possible points.  N = WNL, D = diminished, C = clear for gross weakness with myotome testing, * = concordant pain with testing)      PALPATION:            Significant TTP bil UT L>R and bil lumbar paraspinals; general hyper-sensitively to pressure with MMT   PATIENT SURVEYS:  Modified Oswestry 31/50  Modified Oswestry 08/10/22 31/50      07/27/22 vitals:  HR 99-102, SpO2 97%, BP unable to get reading (although likely confounded by thick sweatshirt + cuff size)  TODAY'S TREATMENT  [facility] Adult PT Treatment:                                                DATE: 08/17/22 Therapeutic Exercise: Seated adduction iso 2x12 cues for pacing  Seated swiss ball press down iso 2x12 cues for pacing, breath control and reduced compensations Seated B ER RTB 3x10 cues for posture Seated hip abd RTB cues for form and posture Seated swiss ball sidebending iso x5 each side cues for posture and reduced compensations    [facility] Adult PT Treatment:                                                DATE: 08/14/2022 Therapeutic Exercise: Aquatic therapy at [place] - therapy pool temp  approximately 92 degrees Pt enters building ambulating independently  Treatment took place in water 3.8 to  4 ft 8 in.feet deep depending upon activity.  Pt entered and exited the pool via stair and handrails independently.  Therapeutic Exercise: Walking forward/backwards/side stepping Wide stance pink bell shoulder extension/flexion, horizontal abduction/adduction x20 each March walk with rainbow DB by sides x2 laps Sidestepping with rainbow DB shoulder ab/adduction x2 laps Runners stretch on bottom step x30" BIL At edge of pool, pt performed LE exercise: Hip abd/add x20 BIL Hip extension x20 BIL Hip ext/flex with knee straight x 20 BIL Hip Circles CC/CCW x10 each BIL Marching hip flexion to knee  extension 20 BIL Hamstring curl x20 BIL Squats 2x20  Pt requires the buoyancy of water for active assisted exercises with buoyancy supported for strengthening and AROM exercises. Hydrostatic pressure also supports joints by unweighting joint load by at least 50 % in 3-4 feet depth water. 80% in chest to neck deep water. Water will provide assistance with movement using the current and laminar flow while the buoyancy reduces weight bearing. Pt requires the viscosity of the water for resistance with strengthening exercises.  [facility] Adult PT Treatment:                                                DATE: 08/10/22 Therapeutic Exercise: Seated swiss ball press down 2x10 cues for appropriate force output Adductor iso 2x10 cues for appropriate force and pain levels  Seated RTB abd 2x10 cues for positioning and appropriate force output  Seated B ER w / RTB around wrist 2x10 cues for posture  Manual Therapy: Gentle STM/effleurage L UT/LS/deltoid, seated w/o relief  Therapeutic  Activity: ODI + education MSK assessment + education Activity modification, pacing of activities, monitoring symptoms    [facility] Adult PT Treatment:                                                DATE: 08/06/2022 Aquatic therapy at [facility] [address] - therapy pool temp  approximately 92degrees Pt enters building ambulating independently  Treatment took place in water 3.8 to  4 ft 8 in.feet deep depending upon activity.  Pt entered and exited the pool via stair and handrails independently   Therapeutic Exercise: Walking forward/backwards/side stepping Ai Chi postures 1-8, cues for diaphragmatic breathing, education on parasympathetic vs sympathetic nervous system, pain modulation Wide stance pink bell shoulder extension/flexion, horizontal abduction/adduction x20 each At edge of pool, pt performed LE exercise: Hip abd/add x20 BIL Hip extension x20 BIL Hip ext/flex with knee straight x 20 BIL Hip Circles CC/CCW x10  each BIL Marching hip flexion to knee extension 20 BIL Hamstring curl x20 BIL Squats 2x20 Sitting on big square blue noodle: Bicycle kick Reverse bicycle kick Flutter kick Scissor kick Cross country skiiers  Pt requires the buoyancy of water for active assisted exercises with buoyancy supported for strengthening and AROM exercises. Hydrostatic pressure also supports joints by unweighting joint load by at least 50 % in 3-4 feet depth water. 80% in chest to neck deep water. Water will provide assistance with movement using the current and laminar flow while the buoyancy reduces weight bearing. Pt requires the viscosity of the water for resistance with strengthening exercises.    PATIENT EDUCATION:  Rationale for interventions, monitoring symptoms and communicating with provider   HOME EXERCISE PROGRAM: Access Code: [email or web address] URL: [email or web address] Date: 07/07/2022 Prepared by: Alphonzo Severance   Exercises - Seated Scapular Retraction  - 1 x daily - 7 x weekly - 3 sets - 10 reps - Supine Lower Trunk Rotation  - 1 x daily - 7 x weekly - 1 sets - 20 reps - 3 hold   ASTERISK SIGNS     Asterisk Signs Eval (07/08/2022) 08/10/22           Pain 6-8/10 8-9/10 NPS           Shoulder flexion MMT 3+ bil  3+ BIL with pain          L knee ext MMT 3+  4 mild pain                                            ASSESSMENT:   CLINICAL IMPRESSION:  Pt arrives w/o 7/10 pain, mostly in BLE, denies issues after recent sessions. States she followed up with PCP and neurologist, further work up ongoing but states she was encouraged to continue with PT. Today pt demos improved tolerance to upper body activity with no increase in pain levels during session, mild increase in volume today. No adverse events, pt denies increase in pain on departure. Pt departs today's session in no acute distress, all voiced questions/concerns addressed appropriately from PT perspective.     OBJECTIVE  IMPAIRMENTS: Pain, LE and UE strength   ACTIVITY LIMITATIONS: lifting, bending, housework, schoolwork    PERSONAL FACTORS: See medical history and pertinent history     REHAB  POTENTIAL: Good   CLINICAL DECISION MAKING: Stable/uncomplicated   EVALUATION COMPLEXITY: Low     GOALS:     SHORT TERM GOALS: Target date: 08/05/2022   Aniyah will be >75% HEP compliant to improve carryover between sessions and facilitate independent management of condition   Evaluation (07/08/2022): ongoing Goal status: INITIAL     LONG TERM GOALS: Target date: 09/02/2022   Brinsley will show a >/= 12 pt improvement in their ODI score (MCID is 12 pts) as a proxy for functional improvement    Evaluation/Baseline (07/08/2022): 31/50 Goal status: INITIAL     2.  Valoria will self report >/= 50% decrease in pain from evaluation    Evaluation/Baseline (07/08/2022): 6-8/10 pain Goal status: INITIAL     3.  Anu will improve the following MMTs to >/= 4/5 to show improvement in strength:      Evaluation/Baseline (07/08/2022):    UPPER EXTREMITY MMT:   MMT Right 07/08/2022 Left 07/08/2022  Shoulder flexion 3+* 3+*  Shoulder abduction (C5) c C  Shoulder ER 4* 4*  Shoulder IR      Middle trapezius      Lower trapezius      Shoulder extension      Grip strength      Cervical flexion (C1,C2)      Cervical S/B (C3)      Shoulder shrug (C4) c c  Elbow flexion (C6) c c  Elbow ext (C7) c c  Thumb ext (C8) c c  Finger abd (T1) c c  Grossly        (Blank rows = not tested, score listed is out of 5 possible points.  N = WNL, D = diminished, C = clear for gross weakness with myotome testing, * = concordant pain with testing)     LE MMT:   MMT Right 07/08/2022 Left 07/08/2022  Hip flexion (L2, L3) 4* 4*  Knee extension (L3) 4* 3+*  Knee flexion 4* 4*  Hip abduction      Hip extension      Hip external rotation      Hip internal rotation      Hip adduction      Ankle dorsiflexion (L4)  c c  Ankle plantarflexion (S1) c c  Ankle inversion      Ankle eversion      Great Toe ext (L5) c c  Grossly        (Blank rows = not tested, score listed is out of 5 possible points.  N = WNL, D = diminished, C = clear for gross weakness with myotome testing, * = concordant pain with testing)   Goal status: INITIAL     4.  Alaza will report confidence in self management of condition at time of discharge with advanced HEP   Evaluation/Baseline (07/08/2022): unable to self manage Goal status: INITIAL       PLAN: PT FREQUENCY: 1-2x/week   PT DURATION: 8 weeks (Ending 09/02/2022)   PLANNED INTERVENTIONS: Therapeutic exercises, Aquatic therapy, Therapeutic activity, Neuro Muscular re-education, Gait training, Patient/Family education, Joint mobilization, Dry Needling, Electrical stimulation, Spinal mobilization and/or manipulation, Moist heat, Taping, Vasopneumatic device, Ionotophoresis 4mg /ml Dexamethasone, and Manual therapy   PLAN FOR NEXT SESSION:   graded exposure, PNE, aquatic therapy. Monitor vitals as appropriate   Leeroy Cha PT, DPT 08/17/2022 3:45 PM    Addendum for discharge:  PHYSICAL THERAPY DISCHARGE SUMMARY  Visits from Start of Care: 9  Current functional level related to goals / functional  outcomes: Unable to assess, pt returned since last visit   Remaining deficits: unknown   Education / Equipment: Unable to assess   Patient unable to agree to discharge due to lack of follow up.  Patient goals were  unable to be assessed . Patient is being discharged due to not returning since the last visit.   Leeroy Cha PT, DPT 10/22/2022 2:52 PM

## 2022-08-18 ENCOUNTER — Other Ambulatory Visit (HOSPITAL_COMMUNITY)
Admission: RE | Admit: 2022-08-18 | Discharge: 2022-08-18 | Disposition: A | Discharge status: Home / Self Care | Payer: Medicaid Other | Source: Ambulatory Visit | Attending: Obstetrics and Gynecology | Admitting: Obstetrics and Gynecology

## 2022-08-18 ENCOUNTER — Encounter: Payer: Self-pay | Admitting: Obstetrics and Gynecology

## 2022-08-18 ENCOUNTER — Ambulatory Visit (INDEPENDENT_AMBULATORY_CARE_PROVIDER_SITE_OTHER): Payer: Medicaid Other | Admitting: Obstetrics and Gynecology

## 2022-08-18 ENCOUNTER — Telehealth: Payer: Self-pay

## 2022-08-18 VITALS — BP 117/80 | HR 92 | Ht 68.0 in | Wt 243.0 lb

## 2022-08-18 DIAGNOSIS — R1030 Lower abdominal pain, unspecified: Secondary | ICD-10-CM | POA: Diagnosis present

## 2022-08-18 DIAGNOSIS — N926 Irregular menstruation, unspecified: Secondary | ICD-10-CM | POA: Diagnosis not present

## 2022-08-18 DIAGNOSIS — Z3202 Encounter for pregnancy test, result negative: Secondary | ICD-10-CM

## 2022-08-18 DIAGNOSIS — Z8742 Personal history of other diseases of the female genital tract: Secondary | ICD-10-CM

## 2022-08-18 LAB — POCT URINALYSIS DIPSTICK
Bilirubin, UA: NEGATIVE
Blood, UA: NEGATIVE
Glucose, UA: NEGATIVE
Ketones, UA: NEGATIVE
Leukocytes, UA: NEGATIVE
Nitrite, UA: NEGATIVE
Odor: NEGATIVE
Protein, UA: POSITIVE — AB
Spec Grav, UA: 1.02 (ref 1.010–1.025)
Urobilinogen, UA: 0.2 E.U./dL
pH, UA: 7 (ref 5.0–8.0)

## 2022-08-18 LAB — POCT URINE PREGNANCY: Preg Test, Ur: NEGATIVE

## 2022-08-18 MED ORDER — TRANEXAMIC ACID 650 MG PO TABS: 1300.0000 mg | ORAL_TABLET | Freq: Three times a day (TID) | ORAL | 0 refills | Status: DC

## 2022-08-18 MED ORDER — MEDROXYPROGESTERONE ACETATE 10 MG PO TABS: ORAL_TABLET | ORAL | 3 refills | Status: DC

## 2022-08-18 NOTE — Progress Notes (Signed)
Pt presents for pelvic and low abdominal pain ranging from dull to sharp 9.5/10.

## 2022-08-18 NOTE — Progress Notes (Signed)
Obstetrics and Gynecology Established Patient Evaluation  Appointment Date: 08/18/2022  OBGYN Clinic: [facility]  Primary Care Provider: Donney Dice  Referring Provider: Donney Dice, DO  Chief Complaint: chronic pelvic pain  History of Present Illness: Jacqueline Gonzalez is a 22 y.o. G0 (LMP: May 2023), seen for the above chief complaint. Her past medical history is significant for h/o PID in August 2023 and irregular periods  Patient with +chlamydia diagnosis in August 2023 and she also had a peri-rectal abscess and needed an I&D by [facility]. She was followed by GI subsequently and had endoscopies and colonoscopies.   She states the lower pelvic pain has been ongoing since August but feels a little worse in the past month. She hasn't had a period since May 2023; she saw Dr. Harolyn Rutherford in September 2023 but never started the Loestrin b/c she states that birth control pills make her feel odd  Review of Systems: Pertinent items noted in HPI and remainder of comprehensive ROS otherwise negative.    Patient Active Problem List   Diagnosis Date Noted   History of PID 08/18/2022   Rectal abscess supralevator s/p I&D 03/14/2022 03/14/2022   Rectal pain    UTI (urinary tract infection) 03/10/2022   Rectal bleeding    Anal fissure    Abnormal CT scan, colon    Proctocolitis 03/09/2022   Chlamydia 11/18/2021   Menorrhagia with irregular cycle 11/18/2021   Dysmenorrhea 11/18/2021   Chronic anemia 11/17/2021   Lower abdominal pain 11/07/2021   Moderate episode of recurrent major depressive disorder ([place]) 06/23/2021   Low back pain 05/02/2021   Headache 09/03/2020   Nonspecific abdominal pain 08/30/2020   Ganglion cyst of dorsum of left wrist 11/03/2019   Constipation, chronic 01/21/2018   Irregular periods 01/21/2018   Generalized anxiety disorder 01/21/2018    Past Medical History:  Past Medical History:  Diagnosis Date   Anemia    Anxiety     Chlamydia 11/13/2021   Constipation, chronic    Depression    Environmental allergies    Ganglion cyst of dorsum of left wrist    RECURRENT   GERD (gastroesophageal reflux disease)    watches diet   Headache    Hypertension    IBS (irritable bowel syndrome)    Lactose intolerance     Past Surgical History:  Past Surgical History:  Procedure Laterality Date   GANGLION CYST EXCISION Left 04/24/2021   Procedure: left recurrent dorsal carpal ganglion excision;  Surgeon: [name], MD;  Location: [place];  Service: Orthopedics;  Laterality: Left;   INCISION AND DRAINAGE PERIRECTAL ABSCESS N/A 03/14/2022   Procedure: IRRIGATION AND DEBRIDEMENT PERIRECTAL ABSCESS;  Surgeon: Michael Boston, MD;  Location: [facility];  Service: General;  Laterality: N/A;   TONSILLECTOMY AND ADENOIDECTOMY     AGE 33   WISDOM TOOTH EXTRACTION  03/18/2021   WRIST GANGLION EXCISION Left 02/2020    Past Obstetrical History:  OB History  Gravida Para Term Preterm AB Living  0 0 0 0 0 0  SAB IAB Ectopic Multiple Live Births  0 0 0 0 0   Past Gynecological History: As per HPI. Contraception: patient in same sex relationship  Social History:  Social History   Socioeconomic History   Marital status: Single    Spouse name: Not on file   Number of children: 0   Years of education: Not on file   Highest education level: Not on file  Occupational History  Occupation: Consulting civil engineer  Tobacco Use   Smoking status: Never   Smokeless tobacco: Never  Vaping Use   Vaping Use: Never used  Substance and Sexual Activity   Alcohol use: Yes    Comment: occ.   Drug use: Never   Sexual activity: Yes    Birth control/protection: OCP  Other Topics Concern   Not on file  Social History Narrative   Not on file   Social Determinants of Health   Financial Resource Strain: Not on file  Food Insecurity: Not on file  Transportation Needs: Not on file  Physical Activity: Not on file  Stress: No  Stress Concern Present (07/04/2021)   [employer] of Occupational Health - Occupational Stress Questionnaire    Feeling of Stress : Only a little  Recent Concern: Stress - Stress Concern Present (06/20/2021)   [employer] of Occupational Health - Occupational Stress Questionnaire    Feeling of Stress : To some extent  Social Connections: Not on file  Intimate Partner Violence: Not on file    Family History:  Family History  Problem Relation Age of Onset   Migraines Mother    Irritable bowel syndrome Mother    Asthma Father    Ovarian cancer Maternal Aunt    Graves' disease Paternal Aunt    Diabetes Maternal Grandmother    Stroke Paternal Grandmother    Diabetes Paternal Grandmother    Kidney disease Paternal Grandfather    Stomach cancer Neg Hx    Esophageal cancer Neg Hx    Colon cancer Neg Hx     Medications Gidget Quizhpi had no medications administered during this visit. Current Outpatient Medications  Medication Sig Dispense Refill   FLUoxetine (PROZAC) 40 MG capsule Take 1 capsule (40 mg total) by mouth daily. (Patient taking differently: Take 40 mg by mouth every morning.) 90 capsule 3   gabapentin (NEURONTIN) 100 MG capsule Take 100 mg by mouth 3 (three) times daily.     ibuprofen (ADVIL) 800 MG tablet Take 1 tablet (800 mg total) by mouth 3 (three) times daily. 21 tablet 0   acetaminophen (TYLENOL) 325 MG tablet Take 2 tablets (650 mg total) by mouth every 6 (six) hours as needed for fever or mild pain. (Patient not taking: Reported on 08/18/2022) 20 tablet 0   albuterol (VENTOLIN HFA) 108 (90 Base) MCG/ACT inhaler Inhale 1-2 puffs into the lungs every 6 (six) hours as needed for wheezing or shortness of breath. (Patient not taking: Reported on 08/18/2022) 6.7 g 0   AMBULATORY NON FORMULARY MEDICATION Medication Name: Diltiazem 2% gel -Using your index finger, apply a small amount of medication inside the rectum up to your first knuckle/joint three times daily  x 6-8 weeks. (Patient not taking: Reported on 08/18/2022) 30 g 1   azithromycin (ZITHROMAX Z-PAK) 250 MG tablet Take 2 tablets on day 1 by mouth and then 1 tablet by mouth daily for 4 days (Patient not taking: Reported on 08/18/2022) 6 each 0   baclofen (LIORESAL) 10 MG tablet Take 10 mg by mouth. (Patient not taking: Reported on 08/18/2022)     cetirizine (ZYRTEC) 10 MG tablet Take 10 mg by mouth daily as needed for allergies. (Patient not taking: Reported on 08/18/2022)     dicyclomine (BENTYL) 20 MG tablet Take 1 tablet (20 mg total) by mouth 4 (four) times daily -  before meals and at bedtime. (Patient not taking: Reported on 08/18/2022) 120 tablet 1   Erenumab-aooe (AIMOVIG) 140 MG/ML SOAJ Inject 140 mg  into the skin every 28 (twenty-eight) days. (Patient not taking: Reported on 08/18/2022) 1.12 mL 11   fluticasone (FLONASE) 50 MCG/ACT nasal spray Place 2 sprays into both nostrils daily. (Patient not taking: Reported on 08/18/2022) 16 g 0   hyoscyamine (LEVSIN SL) 0.125 MG SL tablet Place 1 tablet (0.125 mg total) under the tongue every 4 (four) hours as needed. (Patient not taking: Reported on 05/04/2022) 30 tablet 2   lubiprostone (AMITIZA) 8 MCG capsule Take 1 capsule (8 mcg total) by mouth 2 (two) times daily with a meal. (Patient not taking: Reported on 08/18/2022) 60 capsule 2   omeprazole (PRILOSEC) 40 MG capsule Take 1 capsule (40 mg total) by mouth daily. (Patient not taking: Reported on 08/18/2022) 30 capsule 5   omeprazole (PRILOSEC) 40 MG capsule Take 1 capsule (40 mg total) by mouth 2 (two) times daily. (Patient not taking: Reported on 08/18/2022) 90 capsule 3   polyethylene glycol (MIRALAX / GLYCOLAX) 17 g packet Take 17 g by mouth every 12 (twelve) hours as needed for mild constipation, moderate constipation or severe constipation. (Patient not taking: Reported on 08/18/2022) 14 each 0   promethazine (PHENERGAN) 25 MG tablet Take 1 tablet (25 mg total) by mouth every 6 (six) hours as needed for  nausea or vomiting. (Patient not taking: Reported on 07/02/2022) 15 tablet 0   Psyllium (METAMUCIL PO) Take 1 packet by mouth daily as needed (constipation). (Patient not taking: Reported on 08/18/2022)     triamcinolone cream (KENALOG) 0.1 % Apply 1 Application topically 2 (two) times daily as needed (eczema). (Patient not taking: Reported on 08/18/2022)     No current facility-administered medications for this visit.    Allergies Blue dyes (parenteral), Blueberry [vaccinium angustifolium], Raspberry, Shellfish allergy, and Amitriptyline   Physical Exam:  BP 117/80   Pulse 92   Ht 5\' 8"  (1.727 m)   Wt 243 lb (110.2 kg)   BMI 36.95 kg/m  Body mass index is 36.95 kg/m. General appearance: Well nourished, well developed female in no acute distress.  Neck:  Supple, normal appearance, and no thyromegaly  Cardiovascular: normal s1 and s2.  No murmurs, rubs or gallops. Respiratory:  Clear to auscultation bilateral. Normal respiratory effort Abdomen: positive bowel sounds and no masses, hernias; diffusely non tender to palpation, non distended Neuro/Psych:  Normal mood and affect.  Skin:  Warm and dry.  Lymphatic:  No inguinal lymphadenopathy.   Pelvic exam: is not limited by body habitus EGBUS: within normal limits Vagina: within normal limits and with no blood or discharge in the vault Cervix: normal appearing cervix without tenderness, discharge or lesions. Uterus:  nonenlarged and non tender Adnexa:  normal adnexa and no mass, fullness, tenderness Rectovaginal: deferred  Laboratory: UPT and u/a negative  Radiology: no new imaging.   Assessment: patient stable  Plan:  1. Lower abdominal pain Unsure etiology but exam is negative. F/u labs and imaging. She has been trying to contact her GI for a f/u visit but has been successful. I told her to keep trying and I will CC them on this note. I did tell her that sometimes PID can cause scar tissue on the inside that isn't seen on  imaging and can cause chronic pelvic. Unfortunately, surgery may not help and if it does, the pain most likely will come back within a year.  - POCT Urinalysis Dipstick - Cytology - PAP( [place]) - US PELVIC COMPLETE WITH TRANSVAGINAL; Future - Urine Culture - POCT urine pregnancy  2.  History of PID  3. Irregular periods I told her that this may be contributing to her s/s and another reason to have regular monthly periods. Pt amenable to cyclic Provera to induce a monthly cycle. Lysteda for this next cycle prescribed given how long it's been since her last cycle.   Orders Placed This Encounter  Procedures   Urine Culture   US PELVIC COMPLETE WITH TRANSVAGINAL   POCT Urinalysis Dipstick   POCT urine pregnancy    RTC PRNa  Durene Romans MD Attending [facility])

## 2022-08-20 LAB — URINE CULTURE

## 2022-08-20 NOTE — Therapy (Incomplete)
OUTPATIENT PHYSICAL THERAPY TREATMENT NOTE   Patient Name: Jacqueline Gonzalez MRN: [id number] DOB:02-12-2001, 22 y.o., female Today's Date: 08/20/2022  PCP: Donney Dice, DO  REFERRING PROVIDER: Dianna Rossetti, NP   END OF SESSION:      Past Medical History:  Diagnosis Date   Anemia    Anxiety    Chlamydia 11/13/2021   Constipation, chronic    Depression    Environmental allergies    Ganglion cyst of dorsum of left wrist    RECURRENT   GERD (gastroesophageal reflux disease)    watches diet   Headache    Hypertension    IBS (irritable bowel syndrome)    Lactose intolerance    Past Surgical History:  Procedure Laterality Date   GANGLION CYST EXCISION Left 04/24/2021   Procedure: left recurrent dorsal carpal ganglion excision;  Surgeon: [name], MD;  Location: [place];  Service: Orthopedics;  Laterality: Left;   INCISION AND DRAINAGE PERIRECTAL ABSCESS N/A 03/14/2022   Procedure: IRRIGATION AND DEBRIDEMENT PERIRECTAL ABSCESS;  Surgeon: Michael Boston, MD;  Location: [facility];  Service: General;  Laterality: N/A;   TONSILLECTOMY AND ADENOIDECTOMY     AGE 22   WISDOM TOOTH EXTRACTION  03/18/2021   WRIST GANGLION EXCISION Left 02/2020   Patient Active Problem List   Diagnosis Date Noted   History of PID 08/18/2022   Rectal abscess supralevator s/p I&D 03/14/2022 03/14/2022   Rectal pain    UTI (urinary tract infection) 03/10/2022   Rectal bleeding    Anal fissure    Abnormal CT scan, colon    Proctocolitis 03/09/2022   Chlamydia 11/18/2021   Menorrhagia with irregular cycle 11/18/2021   Dysmenorrhea 11/18/2021   Chronic anemia 11/17/2021   Lower abdominal pain 11/07/2021   Moderate episode of recurrent major depressive disorder ([place]) 06/23/2021   Low back pain 05/02/2021   Headache 09/03/2020   Nonspecific abdominal pain 08/30/2020   Ganglion cyst of dorsum of left wrist 11/03/2019   Constipation, chronic 01/21/2018   Irregular periods  01/21/2018   Generalized anxiety disorder 01/21/2018    REFERRING DIAG: Back pain [M54.9], Neck pain [M54.2]    THERAPY DIAG: Other low back pain - Plan: PT plan of care cert/re-cert   Muscle weakness (generalized) - Plan: PT plan of care cert/re-cert   Cervicalgia - Plan: PT plan of care cert/re-cert  Rationale for Evaluation and Treatment Rehabilitation  PERTINENT HISTORY: Anxiety, anemia, migraines   PRECAUTIONS: None   SUBJECTIVE:  SUBJECTIVE STATEMENT:  ***  Pt states that overall she is feeling about the same other than pelvic pain that seems like it has been worsening since hospitalization in August, sees OB GYN tomorrow.  Main area of pain is in the legs today. Saw PCP last week to discuss leg pain, headaches, and dizziness - states MD still wants her to do PT but is getting imaging of back and testing for RA, getting referred to ortho. Saw neurologist last week as well   PAIN:  Are you having pain? Yes: NPRS scale: 7/10 (mostly in legs) Pain location: low back and legs primarily Pain description: ache Aggravating factors: activity, stair climbing Relieving factors: nothing reported   OBJECTIVE: (objective measures completed at initial evaluation unless otherwise dated)   DIAGNOSTIC FINDINGS:  Nerve conduction - normal of LE   GENERAL OBSERVATION/GAIT:           Forward head, rounded shoulders   SENSATION:          Light touch: R C8 diminished, LE WNL  08/10/22:  No clonus Negative hoffmann and tromner sign  No hyperreflexia    Cervical ROM   ROM ROM  07/08/2022  Flexion N*  Extension N*  Right lateral flexion N*  Left lateral flexion N*  Right rotation N*  Left rotation N*  Flexion rotation (normal is 30 degrees)    Flexion rotation (normal is 30 degrees)      (Blank rows  = not tested, N = WNL, * = concordant pain)     LUMBAR AROM   AROM AROM  07/08/2022  Flexion Fingertips to mid shin (limited by ~25%), w/ concordant pain  Extension limited by 25%, w/ concordant pain  Right lateral flexion WNL, w/ concordant pain  Left lateral flexion WNL, w/ concordant pain  Right rotation WNL  Left rotation WNL    (Blank rows = not tested)     LUMBAR SPECIAL TESTS:  Slump: L (-), R (-)   UPPER EXTREMITY MMT:   MMT Right 07/08/2022 Left 07/08/2022  Shoulder flexion 3+* 3+*  Shoulder abduction (C5) c C  Shoulder ER 4* 4*  Shoulder IR      Middle trapezius      Lower trapezius      Shoulder extension      Grip strength      Cervical flexion (C1,C2)      Cervical S/B (C3)      Shoulder shrug (C4) c c  Elbow flexion (C6) c c  Elbow ext (C7) c c  Thumb ext (C8) c c  Finger abd (T1) c c  Grossly        (Blank rows = not tested, score listed is out of 5 possible points.  N = WNL, D = diminished, C = clear for gross weakness with myotome testing, * = concordant pain with testing)     LE MMT:   MMT Right 07/08/2022 Left 07/08/2022  Hip flexion (L2, L3) 4* 4*  Knee extension (L3) 4* 3+*  Knee flexion 4* 4*  Hip abduction      Hip extension      Hip external rotation      Hip internal rotation      Hip adduction      Ankle dorsiflexion (L4) c c  Ankle plantarflexion (S1) c c  Ankle inversion      Ankle eversion      Great Toe ext (L5) c c  Grossly        (  Blank rows = not tested, score listed is out of 5 possible points.  N = WNL, D = diminished, C = clear for gross weakness with myotome testing, * = concordant pain with testing)      PALPATION:            Significant TTP bil UT L>R and bil lumbar paraspinals; general hyper-sensitively to pressure with MMT   PATIENT SURVEYS:  Modified Oswestry 31/50  Modified Oswestry 08/10/22 31/50      07/27/22 vitals:  HR 99-102, SpO2 97%, BP unable to get reading (although likely confounded by thick  sweatshirt + cuff size)  TODAY'S TREATMENT  [facility] Adult PT Treatment:                                                DATE: 08/21/2022 Aquatic therapy at [facility] [address] - therapy pool temp  approximately 92 degrees Pt enters building ambulating independently  Treatment took place in water 3.8 to  4 ft 8 in.feet deep depending upon activity.  Pt entered and exited the pool via stair and handrails independently.  Therapeutic Exercise: Walking forward/backwards/side stepping Lunge stance pink bell shoulder extension/flexion, horizontal abduction/adduction x20 each March walk with rainbow DB by sides x2 laps Lunge walk with rainbow DB x2 laps Sidestepping with rainbow DB shoulder ab/adduction x2 laps Runners stretch on bottom step x30" BIL At edge of pool, pt performed LE exercise: Hip abd/add x20 BIL Hip extension x20 BIL Hip ext/flex with knee straight x 20 BIL Hip Circles CC/CCW x10 each BIL Marching hip flexion to knee extension 20 BIL Hamstring curl x20 BIL Squats 2x20  Floating with nekdoodle and noodle under knees: focus on diaphragmatic breathing  Pt requires the buoyancy of water for active assisted exercises with buoyancy supported for strengthening and AROM exercises. Hydrostatic pressure also supports joints by unweighting joint load by at least 50 % in 3-4 feet depth water. 80% in chest to neck deep water. Water will provide assistance with movement using the current and laminar flow while the buoyancy reduces weight bearing. Pt requires the viscosity of the water for resistance with strengthening exercises.  [facility] Adult PT Treatment:                                                DATE: 08/17/22 Therapeutic Exercise: Seated adduction iso 2x12 cues for pacing  Seated swiss ball press down iso 2x12 cues for pacing, breath control and reduced compensations Seated B ER RTB 3x10 cues for posture Seated hip abd RTB cues for form and posture Seated swiss ball sidebending  iso x5 each side cues for posture and reduced compensations    [facility] Adult PT Treatment:                                                DATE: 08/14/2022 Therapeutic Exercise: Aquatic therapy at [facility] [address] - therapy pool temp  approximately 92 degrees Pt enters building ambulating independently  Treatment took place in water 3.8 to  4 ft 8 in.feet deep depending upon activity.  Pt entered and exited the pool via stair and handrails independently.  Therapeutic Exercise: Walking forward/backwards/side stepping Wide stance pink bell shoulder extension/flexion, horizontal abduction/adduction x20 each March walk with rainbow DB by sides x2 laps Sidestepping with rainbow DB shoulder ab/adduction x2 laps Runners stretch on bottom step x30" BIL At edge of pool, pt performed LE exercise: Hip abd/add x20 BIL Hip extension x20 BIL Hip ext/flex with knee straight x 20 BIL Hip Circles CC/CCW x10 each BIL Marching hip flexion to knee extension 20 BIL Hamstring curl x20 BIL Squats 2x20  Pt requires the buoyancy of water for active assisted exercises with buoyancy supported for strengthening and AROM exercises. Hydrostatic pressure also supports joints by unweighting joint load by at least 50 % in 3-4 feet depth water. 80% in chest to neck deep water. Water will provide assistance with movement using the current and laminar flow while the buoyancy reduces weight bearing. Pt requires the viscosity of the water for resistance with strengthening exercises.    PATIENT EDUCATION:  Rationale for interventions, monitoring symptoms and communicating with provider   HOME EXERCISE PROGRAM: Access Code: [email or web address] URL: [email or web address] Date: 07/07/2022 Prepared by: Shearon Balo   Exercises - Seated Scapular Retraction  - 1 x daily - 7 x weekly - 3 sets - 10 reps - Supine Lower Trunk Rotation  - 1 x daily - 7 x weekly - 1 sets - 20 reps - 3 hold   ASTERISK SIGNS      Asterisk Signs Eval (07/08/2022) 08/10/22           Pain 6-8/10 8-9/10 NPS           Shoulder flexion MMT 3+ bil  3+ BIL with pain          L knee ext MMT 3+  4 mild pain                                            ASSESSMENT:   CLINICAL IMPRESSION:  ***  Pt arrives w/o 7/10 pain, mostly in BLE, denies issues after recent sessions. States she followed up with PCP and neurologist, further work up ongoing but states she was encouraged to continue with PT. Today pt demos improved tolerance to upper body activity with no increase in pain levels during session, mild increase in volume today. No adverse events, pt denies increase in pain on departure. Pt departs today's session in no acute distress, all voiced questions/concerns addressed appropriately from PT perspective.     OBJECTIVE IMPAIRMENTS: Pain, LE and UE strength   ACTIVITY LIMITATIONS: lifting, bending, housework, schoolwork    PERSONAL FACTORS: See medical history and pertinent history     REHAB POTENTIAL: Good   CLINICAL DECISION MAKING: Stable/uncomplicated   EVALUATION COMPLEXITY: Low     GOALS:     SHORT TERM GOALS: Target date: 08/05/2022   Teasha will be >75% HEP compliant to improve carryover between sessions and facilitate independent management of condition   Evaluation (07/08/2022): ongoing Goal status: INITIAL     LONG TERM GOALS: Target date: 09/02/2022   Jaunita will show a >/= 12 pt improvement in their ODI score (MCID is 12 pts) as a proxy for functional improvement    Evaluation/Baseline (07/08/2022): 31/50 Goal status: INITIAL     2.  Patt will self report >/= 50% decrease in pain from evaluation    Evaluation/Baseline (07/08/2022):  6-8/10 pain Goal status: INITIAL     3.  Jaianna will improve the following MMTs to >/= 4/5 to show improvement in strength:      Evaluation/Baseline (07/08/2022):    UPPER EXTREMITY MMT:   MMT Right 07/08/2022 Left 07/08/2022  Shoulder flexion  3+* 3+*  Shoulder abduction (C5) c C  Shoulder ER 4* 4*  Shoulder IR      Middle trapezius      Lower trapezius      Shoulder extension      Grip strength      Cervical flexion (C1,C2)      Cervical S/B (C3)      Shoulder shrug (C4) c c  Elbow flexion (C6) c c  Elbow ext (C7) c c  Thumb ext (C8) c c  Finger abd (T1) c c  Grossly        (Blank rows = not tested, score listed is out of 5 possible points.  N = WNL, D = diminished, C = clear for gross weakness with myotome testing, * = concordant pain with testing)     LE MMT:   MMT Right 07/08/2022 Left 07/08/2022  Hip flexion (L2, L3) 4* 4*  Knee extension (L3) 4* 3+*  Knee flexion 4* 4*  Hip abduction      Hip extension      Hip external rotation      Hip internal rotation      Hip adduction      Ankle dorsiflexion (L4) c c  Ankle plantarflexion (S1) c c  Ankle inversion      Ankle eversion      Great Toe ext (L5) c c  Grossly        (Blank rows = not tested, score listed is out of 5 possible points.  N = WNL, D = diminished, C = clear for gross weakness with myotome testing, * = concordant pain with testing)   Goal status: INITIAL     4.  Kannon will report confidence in self management of condition at time of discharge with advanced HEP   Evaluation/Baseline (07/08/2022): unable to self manage Goal status: INITIAL       PLAN: PT FREQUENCY: 1-2x/week   PT DURATION: 8 weeks (Ending 09/02/2022)   PLANNED INTERVENTIONS: Therapeutic exercises, Aquatic therapy, Therapeutic activity, Neuro Muscular re-education, Gait training, Patient/Family education, Joint mobilization, Dry Needling, Electrical stimulation, Spinal mobilization and/or manipulation, Moist heat, Taping, Vasopneumatic device, Ionotophoresis 4mg /ml Dexamethasone, and Manual therapy   PLAN FOR NEXT SESSION:   graded exposure, PNE, aquatic therapy. Monitor vitals as appropriate   Margarette Canada, PTA 08/20/22 3:10 PM

## 2022-08-20 NOTE — Telephone Encounter (Signed)
Called patient. No answer. Left a message of my call and asked she call us soon to schedule a follow up appointment with Dr [name].

## 2022-08-21 ENCOUNTER — Ambulatory Visit: Payer: Medicaid Other | Attending: Nurse Practitioner

## 2022-08-21 ENCOUNTER — Telehealth: Payer: Self-pay

## 2022-08-21 NOTE — Telephone Encounter (Signed)
LVM regarding missed aquatics appointment. Confirmed next appointment time on [place].   1st no-show  Jacqueline Gonzalez, [place] 08/21/22 3:38 PM

## 2022-08-24 ENCOUNTER — Ambulatory Visit: Payer: Medicaid Other | Admitting: Physical Therapy

## 2022-08-24 NOTE — Therapy (Incomplete)
OUTPATIENT PHYSICAL THERAPY TREATMENT NOTE   Patient Name: Jacqueline Gonzalez MRN: [id number] DOB:[birth year], 22 y.o., female Today's Date: 08/24/2022  PCP: Donney Dice, DO  REFERRING PROVIDER: Dianna Rossetti, NP   END OF SESSION:      Past Medical History:  Diagnosis Date   Anemia    Anxiety    Chlamydia 11/13/2021   Constipation, chronic    Depression    Environmental allergies    Ganglion cyst of dorsum of left wrist    RECURRENT   GERD (gastroesophageal reflux disease)    watches diet   Headache    Hypertension    IBS (irritable bowel syndrome)    Lactose intolerance    Past Surgical History:  Procedure Laterality Date   GANGLION CYST EXCISION Left 04/24/2021   Procedure: left recurrent dorsal carpal ganglion excision;  Surgeon: [name], MD;  Location: [facility];  Service: Orthopedics;  Laterality: Left;   INCISION AND DRAINAGE PERIRECTAL ABSCESS N/A 03/14/2022   Procedure: IRRIGATION AND DEBRIDEMENT PERIRECTAL ABSCESS;  Surgeon: Michael Boston, MD;  Location: [facility];  Service: General;  Laterality: N/A;   TONSILLECTOMY AND ADENOIDECTOMY     AGE 20   WISDOM TOOTH EXTRACTION  03/18/2021   WRIST GANGLION EXCISION Left 02/2020   Patient Active Problem List   Diagnosis Date Noted   History of PID 08/18/2022   Rectal abscess supralevator s/p I&D 03/14/2022 03/14/2022   Rectal pain    UTI (urinary tract infection) 03/10/2022   Rectal bleeding    Anal fissure    Abnormal CT scan, colon    Proctocolitis 03/09/2022   Chlamydia 11/18/2021   Menorrhagia with irregular cycle 11/18/2021   Dysmenorrhea 11/18/2021   Chronic anemia 11/17/2021   Lower abdominal pain 11/07/2021   Moderate episode of recurrent major depressive disorder ([place]) 06/23/2021   Low back pain 05/02/2021   Headache 09/03/2020   Nonspecific abdominal pain 08/30/2020   Ganglion cyst of dorsum of left wrist 11/03/2019   Constipation, chronic 01/21/2018   Irregular periods  01/21/2018   Generalized anxiety disorder 01/21/2018    REFERRING DIAG: Back pain [M54.9], Neck pain [M54.2]    THERAPY DIAG: Other low back pain - Plan: PT plan of care cert/re-cert   Muscle weakness (generalized) - Plan: PT plan of care cert/re-cert   Cervicalgia - Plan: PT plan of care cert/re-cert  Rationale for Evaluation and Treatment Rehabilitation  PERTINENT HISTORY: Anxiety, anemia, migraines   PRECAUTIONS: None   SUBJECTIVE:  SUBJECTIVE STATEMENT:  ***   *** Pt states that overall she is feeling about the same other than pelvic pain that seems like it has been worsening since hospitalization in August, sees OB GYN tomorrow.  Main area of pain is in the legs today. Saw PCP last week to discuss leg pain, headaches, and dizziness - states MD still wants her to do PT but is getting imaging of back and testing for RA, getting referred to ortho. Saw neurologist last week as well   PAIN:  Are you having pain? Yes: NPRS scale: 7/10 (mostly in legs) Pain location: low back and legs primarily Pain description: ache Aggravating factors: activity, stair climbing Relieving factors: nothing reported   OBJECTIVE: (objective measures completed at initial evaluation unless otherwise dated)   DIAGNOSTIC FINDINGS:  Nerve conduction - normal of LE   GENERAL OBSERVATION/GAIT:           Forward head, rounded shoulders   SENSATION:          Light touch: R C8 diminished, LE WNL  08/10/22:  No clonus Negative hoffmann and tromner sign  No hyperreflexia    Cervical ROM   ROM ROM  07/08/2022  Flexion N*  Extension N*  Right lateral flexion N*  Left lateral flexion N*  Right rotation N*  Left rotation N*  Flexion rotation (normal is 30 degrees)    Flexion rotation (normal is 30 degrees)      (Blank  rows = not tested, N = WNL, * = concordant pain)     LUMBAR AROM   AROM AROM  07/08/2022  Flexion Fingertips to mid shin (limited by ~25%), w/ concordant pain  Extension limited by 25%, w/ concordant pain  Right lateral flexion WNL, w/ concordant pain  Left lateral flexion WNL, w/ concordant pain  Right rotation WNL  Left rotation WNL    (Blank rows = not tested)     LUMBAR SPECIAL TESTS:  Slump: L (-), R (-)   UPPER EXTREMITY MMT:   MMT Right 07/08/2022 Left 07/08/2022  Shoulder flexion 3+* 3+*  Shoulder abduction (C5) c C  Shoulder ER 4* 4*  Shoulder IR      Middle trapezius      Lower trapezius      Shoulder extension      Grip strength      Cervical flexion (C1,C2)      Cervical S/B (C3)      Shoulder shrug (C4) c c  Elbow flexion (C6) c c  Elbow ext (C7) c c  Thumb ext (C8) c c  Finger abd (T1) c c  Grossly        (Blank rows = not tested, score listed is out of 5 possible points.  N = WNL, D = diminished, C = clear for gross weakness with myotome testing, * = concordant pain with testing)     LE MMT:   MMT Right 07/08/2022 Left 07/08/2022  Hip flexion (L2, L3) 4* 4*  Knee extension (L3) 4* 3+*  Knee flexion 4* 4*  Hip abduction      Hip extension      Hip external rotation      Hip internal rotation      Hip adduction      Ankle dorsiflexion (L4) c c  Ankle plantarflexion (S1) c c  Ankle inversion      Ankle eversion      Great Toe ext (L5) c c  Grossly        (  Blank rows = not tested, score listed is out of 5 possible points.  N = WNL, D = diminished, C = clear for gross weakness with myotome testing, * = concordant pain with testing)      PALPATION:            Significant TTP bil UT L>R and bil lumbar paraspinals; general hyper-sensitively to pressure with MMT   PATIENT SURVEYS:  Modified Oswestry 31/50  Modified Oswestry 08/10/22 31/50      07/27/22 vitals:  HR 99-102, SpO2 97%, BP unable to get reading (although likely confounded by  thick sweatshirt + cuff size)  TODAY'S TREATMENT  [facility] Adult PT Treatment:                                                DATE: 08/24/22 Therapeutic Exercise: *** Manual Therapy: *** Neuromuscular re-ed: *** Therapeutic Activity: *** Modalities: *** Self Care: ***   [facility] Adult PT Treatment:                                                DATE: 08/17/22 Therapeutic Exercise: Seated adduction iso 2x12 cues for pacing  Seated swiss ball press down iso 2x12 cues for pacing, breath control and reduced compensations Seated B ER RTB 3x10 cues for posture Seated hip abd RTB cues for form and posture Seated swiss ball sidebending iso x5 each side cues for posture and reduced compensations    [facility] Adult PT Treatment:                                                DATE: 08/14/2022 Therapeutic Exercise: Aquatic therapy at [place] - therapy pool temp  approximately 92 degrees Pt enters building ambulating independently  Treatment took place in water 3.8 to  4 ft 8 in.feet deep depending upon activity.  Pt entered and exited the pool via stair and handrails independently.  Therapeutic Exercise: Walking forward/backwards/side stepping Wide stance pink bell shoulder extension/flexion, horizontal abduction/adduction x20 each March walk with rainbow DB by sides x2 laps Sidestepping with rainbow DB shoulder ab/adduction x2 laps Runners stretch on bottom step x30" BIL At edge of pool, pt performed LE exercise: Hip abd/add x20 BIL Hip extension x20 BIL Hip ext/flex with knee straight x 20 BIL Hip Circles CC/CCW x10 each BIL Marching hip flexion to knee extension 20 BIL Hamstring curl x20 BIL Squats 2x20  Pt requires the buoyancy of water for active assisted exercises with buoyancy supported for strengthening and AROM exercises. Hydrostatic pressure also supports joints by unweighting joint load by at least 50 % in 3-4 feet depth water. 80% in chest to neck deep water.  Water will provide assistance with movement using the current and laminar flow while the buoyancy reduces weight bearing. Pt requires the viscosity of the water for resistance with strengthening exercises.   PATIENT EDUCATION:  Rationale for interventions, monitoring symptoms and communicating with provider   HOME EXERCISE PROGRAM: Access Code: [email or web address] URL: [email or web address] Date: 07/07/2022 Prepared by: Shearon Balo   Exercises - Seated Scapular Retraction  - 1  x daily - 7 x weekly - 3 sets - 10 reps - Supine Lower Trunk Rotation  - 1 x daily - 7 x weekly - 1 sets - 20 reps - 3 hold   ASTERISK SIGNS     Asterisk Signs Eval (07/08/2022) 08/10/22           Pain 6-8/10 8-9/10 NPS           Shoulder flexion MMT 3+ bil  3+ BIL with pain          L knee ext MMT 3+  4 mild pain                                            ASSESSMENT:   CLINICAL IMPRESSION:  ***  *** Pt arrives w/o 7/10 pain, mostly in BLE, denies issues after recent sessions. States she followed up with PCP and neurologist, further work up ongoing but states she was encouraged to continue with PT. Today pt demos improved tolerance to upper body activity with no increase in pain levels during session, mild increase in volume today. No adverse events, pt denies increase in pain on departure. Pt departs today's session in no acute distress, all voiced questions/concerns addressed appropriately from PT perspective.     OBJECTIVE IMPAIRMENTS: Pain, LE and UE strength   ACTIVITY LIMITATIONS: lifting, bending, housework, schoolwork    PERSONAL FACTORS: See medical history and pertinent history     REHAB POTENTIAL: Good   CLINICAL DECISION MAKING: Stable/uncomplicated   EVALUATION COMPLEXITY: Low     GOALS:     SHORT TERM GOALS: Target date: 08/05/2022   Lennan will be >75% HEP compliant to improve carryover between sessions and facilitate independent management of condition   Evaluation  (07/08/2022): ongoing Goal status: INITIAL     LONG TERM GOALS: Target date: 09/02/2022   Annamaria will show a >/= 12 pt improvement in their ODI score (MCID is 12 pts) as a proxy for functional improvement    Evaluation/Baseline (07/08/2022): 31/50 Goal status: INITIAL     2.  Evadne will self report >/= 50% decrease in pain from evaluation    Evaluation/Baseline (07/08/2022): 6-8/10 pain Goal status: INITIAL     3.  Elaijah will improve the following MMTs to >/= 4/5 to show improvement in strength:      Evaluation/Baseline (07/08/2022):    UPPER EXTREMITY MMT:   MMT Right 07/08/2022 Left 07/08/2022  Shoulder flexion 3+* 3+*  Shoulder abduction (C5) c C  Shoulder ER 4* 4*  Shoulder IR      Middle trapezius      Lower trapezius      Shoulder extension      Grip strength      Cervical flexion (C1,C2)      Cervical S/B (C3)      Shoulder shrug (C4) c c  Elbow flexion (C6) c c  Elbow ext (C7) c c  Thumb ext (C8) c c  Finger abd (T1) c c  Grossly        (Blank rows = not tested, score listed is out of 5 possible points.  N = WNL, D = diminished, C = clear for gross weakness with myotome testing, * = concordant pain with testing)     LE MMT:   MMT Right 07/08/2022 Left 07/08/2022  Hip flexion (L2, L3) 4* 4*  Knee extension (L3)  4* 3+*  Knee flexion 4* 4*  Hip abduction      Hip extension      Hip external rotation      Hip internal rotation      Hip adduction      Ankle dorsiflexion (L4) c c  Ankle plantarflexion (S1) c c  Ankle inversion      Ankle eversion      Great Toe ext (L5) c c  Grossly        (Blank rows = not tested, score listed is out of 5 possible points.  N = WNL, D = diminished, C = clear for gross weakness with myotome testing, * = concordant pain with testing)   Goal status: INITIAL     4.  Donnia will report confidence in self management of condition at time of discharge with advanced HEP   Evaluation/Baseline (07/08/2022): unable  to self manage Goal status: INITIAL       PLAN: PT FREQUENCY: 1-2x/week   PT DURATION: 8 weeks (Ending 09/02/2022)   PLANNED INTERVENTIONS: Therapeutic exercises, Aquatic therapy, Therapeutic activity, Neuro Muscular re-education, Gait training, Patient/Family education, Joint mobilization, Dry Needling, Electrical stimulation, Spinal mobilization and/or manipulation, Moist heat, Taping, Vasopneumatic device, Ionotophoresis 4mg /ml Dexamethasone, and Manual therapy   PLAN FOR NEXT SESSION:   graded exposure, PNE, aquatic therapy. Monitor vitals as appropriate   PT, DPT 08/24/2022 1:06 PM

## 2022-08-25 ENCOUNTER — Ambulatory Visit
Admission: RE | Admit: 2022-08-25 | Discharge: 2022-08-25 | Disposition: A | Discharge status: Home / Self Care | Payer: Medicaid Other | Source: Ambulatory Visit | Attending: Obstetrics and Gynecology | Admitting: Obstetrics and Gynecology

## 2022-08-25 DIAGNOSIS — R1030 Lower abdominal pain, unspecified: Secondary | ICD-10-CM | POA: Insufficient documentation

## 2022-08-25 LAB — CYTOLOGY - PAP
Chlamydia: NEGATIVE
Comment: NEGATIVE
Comment: NEGATIVE
Comment: NORMAL
Neisseria Gonorrhea: NEGATIVE
Trichomonas: NEGATIVE

## 2022-08-26 ENCOUNTER — Encounter: Payer: Self-pay | Admitting: Obstetrics and Gynecology

## 2022-08-26 DIAGNOSIS — E282 Polycystic ovarian syndrome: Secondary | ICD-10-CM | POA: Insufficient documentation

## 2022-08-26 DIAGNOSIS — R87612 Low grade squamous intraepithelial lesion on cytologic smear of cervix (LGSIL): Secondary | ICD-10-CM | POA: Insufficient documentation

## 2022-08-28 ENCOUNTER — Ambulatory Visit: Payer: Medicaid Other

## 2022-08-28 NOTE — Therapy (Incomplete)
OUTPATIENT PHYSICAL THERAPY TREATMENT NOTE   Patient Name: Jacqueline Gonzalez MRN: [id number] DOB:Oct 25, 2000, 22 y.o., female Today's Date: 08/28/2022  PCP: Donney Dice, DO  REFERRING PROVIDER: Dianna Rossetti, NP   END OF SESSION:      Past Medical History:  Diagnosis Date   Anemia    Anxiety    Chlamydia 11/13/2021   Constipation, chronic    Depression    Environmental allergies    Ganglion cyst of dorsum of left wrist    RECURRENT   GERD (gastroesophageal reflux disease)    watches diet   Headache    Hypertension    IBS (irritable bowel syndrome)    Lactose intolerance    Past Surgical History:  Procedure Laterality Date   GANGLION CYST EXCISION Left 04/24/2021   Procedure: left recurrent dorsal carpal ganglion excision;  Surgeon: [name], MD;  Location: [facility];  Service: Orthopedics;  Laterality: Left;   INCISION AND DRAINAGE PERIRECTAL ABSCESS N/A 03/14/2022   Procedure: IRRIGATION AND DEBRIDEMENT PERIRECTAL ABSCESS;  Surgeon: Michael Boston, MD;  Location: [facility];  Service: General;  Laterality: N/A;   TONSILLECTOMY AND ADENOIDECTOMY     AGE 80   WISDOM TOOTH EXTRACTION  03/18/2021   WRIST GANGLION EXCISION Left 02/2020   Patient Active Problem List   Diagnosis Date Noted   PCOS (polycystic ovarian syndrome) 08/26/2022   LGSIL of cervix of undetermined significance 08/26/2022   History of PID 08/18/2022   Rectal abscess supralevator s/p I&D 03/14/2022 03/14/2022   Rectal pain    UTI (urinary tract infection) 03/10/2022   Rectal bleeding    Anal fissure    Abnormal CT scan, colon    Proctocolitis 03/09/2022   Chlamydia 11/18/2021   Menorrhagia with irregular cycle 11/18/2021   Dysmenorrhea 11/18/2021   Chronic anemia 11/17/2021   Lower abdominal pain 11/07/2021   Moderate episode of recurrent major depressive disorder ([place]) 06/23/2021   Low back pain 05/02/2021   Headache 09/03/2020   Nonspecific abdominal pain 08/30/2020    Ganglion cyst of dorsum of left wrist 11/03/2019   Constipation, chronic 01/21/2018   Irregular periods 01/21/2018   Generalized anxiety disorder 01/21/2018    REFERRING DIAG: Back pain [M54.9], Neck pain [M54.2]    THERAPY DIAG: Other low back pain - Plan: PT plan of care cert/re-cert   Muscle weakness (generalized) - Plan: PT plan of care cert/re-cert   Cervicalgia - Plan: PT plan of care cert/re-cert  Rationale for Evaluation and Treatment Rehabilitation  PERTINENT HISTORY: Anxiety, anemia, migraines   PRECAUTIONS: None   SUBJECTIVE:  SUBJECTIVE STATEMENT:  ***  Pt states that overall she is feeling about the same other than pelvic pain that seems like it has been worsening since hospitalization in August, sees OB GYN tomorrow.  Main area of pain is in the legs today. Saw PCP last week to discuss leg pain, headaches, and dizziness - states MD still wants her to do PT but is getting imaging of back and testing for RA, getting referred to ortho. Saw neurologist last week as well   PAIN:  Are you having pain? Yes: NPRS scale: 7/10 (mostly in legs) Pain location: low back and legs primarily Pain description: ache Aggravating factors: activity, stair climbing Relieving factors: nothing reported   OBJECTIVE: (objective measures completed at initial evaluation unless otherwise dated)   DIAGNOSTIC FINDINGS:  Nerve conduction - normal of LE   GENERAL OBSERVATION/GAIT:           Forward head, rounded shoulders   SENSATION:          Light touch: R C8 diminished, LE WNL  08/10/22:  No clonus Negative hoffmann and tromner sign  No hyperreflexia    Cervical ROM   ROM ROM  07/08/2022  Flexion N*  Extension N*  Right lateral flexion N*  Left lateral flexion N*  Right rotation N*  Left rotation N*   Flexion rotation (normal is 30 degrees)    Flexion rotation (normal is 30 degrees)      (Blank rows = not tested, N = WNL, * = concordant pain)     LUMBAR AROM   AROM AROM  07/08/2022  Flexion Fingertips to mid shin (limited by ~25%), w/ concordant pain  Extension limited by 25%, w/ concordant pain  Right lateral flexion WNL, w/ concordant pain  Left lateral flexion WNL, w/ concordant pain  Right rotation WNL  Left rotation WNL    (Blank rows = not tested)     LUMBAR SPECIAL TESTS:  Slump: L (-), R (-)   UPPER EXTREMITY MMT:   MMT Right 07/08/2022 Left 07/08/2022  Shoulder flexion 3+* 3+*  Shoulder abduction (C5) c C  Shoulder ER 4* 4*  Shoulder IR      Middle trapezius      Lower trapezius      Shoulder extension      Grip strength      Cervical flexion (C1,C2)      Cervical S/B (C3)      Shoulder shrug (C4) c c  Elbow flexion (C6) c c  Elbow ext (C7) c c  Thumb ext (C8) c c  Finger abd (T1) c c  Grossly        (Blank rows = not tested, score listed is out of 5 possible points.  N = WNL, D = diminished, C = clear for gross weakness with myotome testing, * = concordant pain with testing)     LE MMT:   MMT Right 07/08/2022 Left 07/08/2022  Hip flexion (L2, L3) 4* 4*  Knee extension (L3) 4* 3+*  Knee flexion 4* 4*  Hip abduction      Hip extension      Hip external rotation      Hip internal rotation      Hip adduction      Ankle dorsiflexion (L4) c c  Ankle plantarflexion (S1) c c  Ankle inversion      Ankle eversion      Great Toe ext (L5) c c  Grossly        (  Blank rows = not tested, score listed is out of 5 possible points.  N = WNL, D = diminished, C = clear for gross weakness with myotome testing, * = concordant pain with testing)      PALPATION:            Significant TTP bil UT L>R and bil lumbar paraspinals; general hyper-sensitively to pressure with MMT   PATIENT SURVEYS:  Modified Oswestry 31/50  Modified Oswestry 08/10/22 31/50       07/27/22 vitals:  HR 99-102, SpO2 97%, BP unable to get reading (although likely confounded by thick sweatshirt + cuff size)  TODAY'S TREATMENT  [facility] Adult PT Treatment:                                                DATE: 08/28/2022 Aquatic therapy at [address] - therapy pool temp  approximately 92 degrees Pt enters building ambulating independently  Treatment took place in water 3.8 to  4 ft 8 in.feet deep depending upon activity.  Pt entered and exited the pool via stair and handrails independently.  Therapeutic Exercise: Walking forward/backwards/side stepping Lunge stance pink bell shoulder extension/flexion, horizontal abduction/adduction x20 each March walk with rainbow DB by sides x2 laps Lunge walk with rainbow DB x2 laps Sidestepping with rainbow DB shoulder ab/adduction x2 laps Runners stretch on bottom step x30" BIL At edge of pool, pt performed LE exercise: Hip abd/add x20 BIL Hip extension x20 BIL Hip ext/flex with knee straight x 20 BIL Hip Circles CC/CCW x10 each BIL Marching hip flexion to knee extension 20 BIL Hamstring curl x20 BIL Squats 2x20  Floating with nekdoodle and noodle under knees: focus on diaphragmatic breathing  Pt requires the buoyancy of water for active assisted exercises with buoyancy supported for strengthening and AROM exercises. Hydrostatic pressure also supports joints by unweighting joint load by at least 50 % in 3-4 feet depth water. 80% in chest to neck deep water. Water will provide assistance with movement using the current and laminar flow while the buoyancy reduces weight bearing. Pt requires the viscosity of the water for resistance with strengthening exercises.  [facility] Adult PT Treatment:                                                DATE: 08/17/22 Therapeutic Exercise: Seated adduction iso 2x12 cues for pacing  Seated swiss ball press down iso 2x12 cues for pacing, breath control and reduced compensations Seated B ER  RTB 3x10 cues for posture Seated hip abd RTB cues for form and posture Seated swiss ball sidebending iso x5 each side cues for posture and reduced compensations    [facility] Adult PT Treatment:                                                DATE: 08/14/2022 Therapeutic Exercise: Aquatic therapy at [place] - therapy pool temp  approximately 92 degrees Pt enters building ambulating independently  Treatment took place in water 3.8 to  4 ft 8 in.feet deep depending upon activity.  Pt entered and exited the pool via stair and handrails independently.  Therapeutic Exercise: Walking forward/backwards/side stepping Wide stance pink bell shoulder extension/flexion, horizontal abduction/adduction x20 each March walk with rainbow DB by sides x2 laps Sidestepping with rainbow DB shoulder ab/adduction x2 laps Runners stretch on bottom step x30" BIL At edge of pool, pt performed LE exercise: Hip abd/add x20 BIL Hip extension x20 BIL Hip ext/flex with knee straight x 20 BIL Hip Circles CC/CCW x10 each BIL Marching hip flexion to knee extension 20 BIL Hamstring curl x20 BIL Squats 2x20  Pt requires the buoyancy of water for active assisted exercises with buoyancy supported for strengthening and AROM exercises. Hydrostatic pressure also supports joints by unweighting joint load by at least 50 % in 3-4 feet depth water. 80% in chest to neck deep water. Water will provide assistance with movement using the current and laminar flow while the buoyancy reduces weight bearing. Pt requires the viscosity of the water for resistance with strengthening exercises.    PATIENT EDUCATION:  Rationale for interventions, monitoring symptoms and communicating with provider   HOME EXERCISE PROGRAM: Access Code: [email or web address] URL: [email or web address] Date: 07/07/2022 Prepared by: Shearon Balo   Exercises - Seated Scapular Retraction  - 1 x daily - 7 x weekly - 3 sets - 10 reps -  Supine Lower Trunk Rotation  - 1 x daily - 7 x weekly - 1 sets - 20 reps - 3 hold   ASTERISK SIGNS     Asterisk Signs Eval (07/08/2022) 08/10/22           Pain 6-8/10 8-9/10 NPS           Shoulder flexion MMT 3+ bil  3+ BIL with pain          L knee ext MMT 3+  4 mild pain                                            ASSESSMENT:   CLINICAL IMPRESSION:  ***  Pt arrives w/o 7/10 pain, mostly in BLE, denies issues after recent sessions. States she followed up with PCP and neurologist, further work up ongoing but states she was encouraged to continue with PT. Today pt demos improved tolerance to upper body activity with no increase in pain levels during session, mild increase in volume today. No adverse events, pt denies increase in pain on departure. Pt departs today's session in no acute distress, all voiced questions/concerns addressed appropriately from PT perspective.     OBJECTIVE IMPAIRMENTS: Pain, LE and UE strength   ACTIVITY LIMITATIONS: lifting, bending, housework, schoolwork    PERSONAL FACTORS: See medical history and pertinent history     REHAB POTENTIAL: Good   CLINICAL DECISION MAKING: Stable/uncomplicated   EVALUATION COMPLEXITY: Low     GOALS:     SHORT TERM GOALS: Target date: 08/05/2022   Leidy will be >75% HEP compliant to improve carryover between sessions and facilitate independent management of condition   Evaluation (07/08/2022): ongoing Goal status: INITIAL     LONG TERM GOALS: Target date: 09/02/2022   Aabriella will show a >/= 12 pt improvement in their ODI score (MCID is 12 pts) as a proxy for functional improvement    Evaluation/Baseline (07/08/2022): 31/50 Goal status: INITIAL     2.  Zaliyah will self report >/= 50% decrease in pain from evaluation    Evaluation/Baseline (07/08/2022):  6-8/10 pain Goal status: INITIAL     3.  Taianna will improve the following MMTs to >/= 4/5 to show improvement in strength:      Evaluation/Baseline  (07/08/2022):    UPPER EXTREMITY MMT:   MMT Right 07/08/2022 Left 07/08/2022  Shoulder flexion 3+* 3+*  Shoulder abduction (C5) c C  Shoulder ER 4* 4*  Shoulder IR      Middle trapezius      Lower trapezius      Shoulder extension      Grip strength      Cervical flexion (C1,C2)      Cervical S/B (C3)      Shoulder shrug (C4) c c  Elbow flexion (C6) c c  Elbow ext (C7) c c  Thumb ext (C8) c c  Finger abd (T1) c c  Grossly        (Blank rows = not tested, score listed is out of 5 possible points.  N = WNL, D = diminished, C = clear for gross weakness with myotome testing, * = concordant pain with testing)     LE MMT:   MMT Right 07/08/2022 Left 07/08/2022  Hip flexion (L2, L3) 4* 4*  Knee extension (L3) 4* 3+*  Knee flexion 4* 4*  Hip abduction      Hip extension      Hip external rotation      Hip internal rotation      Hip adduction      Ankle dorsiflexion (L4) c c  Ankle plantarflexion (S1) c c  Ankle inversion      Ankle eversion      Great Toe ext (L5) c c  Grossly        (Blank rows = not tested, score listed is out of 5 possible points.  N = WNL, D = diminished, C = clear for gross weakness with myotome testing, * = concordant pain with testing)   Goal status: INITIAL     4.  Azayla will report confidence in self management of condition at time of discharge with advanced HEP   Evaluation/Baseline (07/08/2022): unable to self manage Goal status: INITIAL       PLAN: PT FREQUENCY: 1-2x/week   PT DURATION: 8 weeks (Ending 09/02/2022)   PLANNED INTERVENTIONS: Therapeutic exercises, Aquatic therapy, Therapeutic activity, Neuro Muscular re-education, Gait training, Patient/Family education, Joint mobilization, Dry Needling, Electrical stimulation, Spinal mobilization and/or manipulation, Moist heat, Taping, Vasopneumatic device, Ionotophoresis 70m/ml Dexamethasone, and Manual therapy   PLAN FOR NEXT SESSION:   graded exposure, PNE, aquatic therapy.  Monitor vitals as appropriate   SMargarette Canada PTA 08/28/22 11:30 AM

## 2022-09-17 ENCOUNTER — Ambulatory Visit: Payer: Medicaid Other | Admitting: Obstetrics and Gynecology

## 2022-09-17 ENCOUNTER — Encounter: Payer: Self-pay | Admitting: Obstetrics and Gynecology

## 2022-09-17 VITALS — BP 117/80 | HR 83

## 2022-09-17 DIAGNOSIS — R102 Pelvic and perineal pain: Secondary | ICD-10-CM

## 2022-09-17 DIAGNOSIS — G8929 Other chronic pain: Secondary | ICD-10-CM

## 2022-09-17 DIAGNOSIS — Z8742 Personal history of other diseases of the female genital tract: Secondary | ICD-10-CM

## 2022-09-17 NOTE — Progress Notes (Signed)
Obstetrics and Gynecology Visit Return Patient Evaluation  Appointment Date: 09/17/2022  Primary Care Provider: [place], [facility]: [facility]  Chief Complaint: vaginal pain; chronic pelvic pain   History of Present Illness:  Jacqueline Gonzalez is a 22 y.o. with above CC.  U/s ordered at last visit was negative except confirming PCOS with overall ovarian sizes normal (3-3.6cm)  Interval History: Since that time, she states that she had vaginal pain that started the da after I saw her on 1/30. Her pap smear, exam and STD swab testing was negative at that visit. She started the cyclic provera a few days ago; no period in the interim.   Review of Systems: as noted in the History of Present Illness.  Patient Active Problem List   Diagnosis Date Noted   PCOS (polycystic ovarian syndrome) 08/26/2022   LGSIL of cervix of undetermined significance 08/26/2022   History of PID 08/18/2022   Rectal abscess supralevator s/p I&D 03/14/2022 03/14/2022   Rectal pain    UTI (urinary tract infection) 03/10/2022   Rectal bleeding    Anal fissure    Abnormal CT scan, colon    Proctocolitis 03/09/2022   Chlamydia 11/18/2021   Menorrhagia with irregular cycle 11/18/2021   Dysmenorrhea 11/18/2021   Chronic anemia 11/17/2021   Lower abdominal pain 11/07/2021   Moderate episode of recurrent major depressive disorder ([place]) 06/23/2021   Low back pain 05/02/2021   Headache 09/03/2020   Nonspecific abdominal pain 08/30/2020   Ganglion cyst of dorsum of left wrist 11/03/2019   Constipation, chronic 01/21/2018   Irregular periods 01/21/2018   Generalized anxiety disorder 01/21/2018   Medications:  [name] had no medications administered during this visit. Current Outpatient Medications  Medication Sig Dispense Refill   albuterol (VENTOLIN HFA) 108 (90 Base) MCG/ACT inhaler Inhale 1-2 puffs into the lungs every 6 (six) hours as needed for wheezing or  shortness of breath. 6.7 g 0   FLUoxetine (PROZAC) 40 MG capsule Take 1 capsule (40 mg total) by mouth daily. (Patient taking differently: Take 40 mg by mouth every morning.) 90 capsule 3   gabapentin (NEURONTIN) 100 MG capsule Take 100 mg by mouth 3 (three) times daily.     medroxyPROGESTERone (PROVERA) 10 MG tablet 1 tab po qday x 14 days each month. You can expect a period to start after the finishing the 14 days. 42 tablet 3   AMBULATORY NON FORMULARY MEDICATION Medication Name: Diltiazem 2% gel -Using your index finger, apply a small amount of medication inside the rectum up to your first knuckle/joint three times daily x 6-8 weeks. (Patient not taking: Reported on 08/18/2022) 30 g 1   baclofen (LIORESAL) 10 MG tablet Take 10 mg by mouth. (Patient not taking: Reported on 08/18/2022)     dicyclomine (BENTYL) 20 MG tablet Take 1 tablet (20 mg total) by mouth 4 (four) times daily -  before meals and at bedtime. (Patient not taking: Reported on 08/18/2022) 120 tablet 1   Erenumab-aooe (AIMOVIG) 140 MG/ML SOAJ Inject 140 mg into the skin every 28 (twenty-eight) days. (Patient not taking: Reported on 08/18/2022) 1.12 mL 11   fluticasone (FLONASE) 50 MCG/ACT nasal spray Place 2 sprays into both nostrils daily. (Patient not taking: Reported on 08/18/2022) 16 g 0   hyoscyamine (LEVSIN SL) 0.125 MG SL tablet Place 1 tablet (0.125 mg total) under the tongue every 4 (four) hours as needed. (Patient not taking: Reported on 05/04/2022) 30 tablet 2   ibuprofen (ADVIL)  800 MG tablet Take 1 tablet (800 mg total) by mouth 3 (three) times daily. 21 tablet 0   lubiprostone (AMITIZA) 8 MCG capsule Take 1 capsule (8 mcg total) by mouth 2 (two) times daily with a meal. (Patient not taking: Reported on 08/18/2022) 60 capsule 2   promethazine (PHENERGAN) 25 MG tablet Take 1 tablet (25 mg total) by mouth every 6 (six) hours as needed for nausea or vomiting. (Patient not taking: Reported on 07/02/2022) 15 tablet 0   Psyllium  (METAMUCIL PO) Take 1 packet by mouth daily as needed (constipation). (Patient not taking: Reported on 08/18/2022)     tranexamic acid (LYSTEDA) 650 MG TABS tablet Take 2 tablets (1,300 mg total) by mouth 3 (three) times daily. Take during menses for a maximum of five days (Patient not taking: Reported on 09/17/2022) 30 tablet 0   triamcinolone cream (KENALOG) 0.1 % Apply 1 Application topically 2 (two) times daily as needed (eczema). (Patient not taking: Reported on 08/18/2022)     No current facility-administered medications for this visit.    Allergies: is allergic to blue dyes (parenteral), blueberry [vaccinium angustifolium], raspberry, shellfish allergy, and amitriptyline.  Physical Exam:  BP 117/80   Pulse 83  There is no height or weight on file to calculate BMI. General appearance: Well nourished, well developed female in no acute distress.  Abdomen: diffusely non tender to palpation, non distended, and no masses, hernias Neuro/Psych:  Normal mood and affect.    Pelvic exam:  deferred   Assessment: patient stable  Plan:  1. Vaginal pain I d/w her that she is probably suffering from sequelae of the PID, peri-rectal abscess and need for subsequent I&D. I told her that she's only about six months from that so she's still healing, but I told her that if her s/s continue after a year that this is likely a chronic issue with no medications or surgeries that would be helpful. I did tell her that pelvic floor PT may be helpful and they may find something on exam and assessment that could be amenable to therapy or other intervention.   Patient has GI f/u soon.  - Ambulatory referral to Physical Therapy  2. Chronic pelvic pain in female - Ambulatory referral to Physical Therapy  3. History of PID - Ambulatory referral to Physical Therapy   RTC: 1 year for repeat pap smear  No follow-ups on file.  Future Appointments  Date Time Provider [place]  09/24/2022 11:00 AM  Levin Erp, [place] [facility]  11/11/2022  2:30 PM Pieter Partridge, DO [facility] None    Durene Romans MD Attending Center for [facility])

## 2022-09-17 NOTE — Progress Notes (Signed)
Still having pelvic pain, and shooting pains in her vagina

## 2022-09-24 ENCOUNTER — Encounter: Payer: Self-pay | Admitting: Physician Assistant

## 2022-09-24 ENCOUNTER — Ambulatory Visit: Payer: Medicaid Other | Admitting: Physician Assistant

## 2022-09-24 VITALS — BP 120/80 | HR 93 | Ht 68.0 in | Wt 247.0 lb

## 2022-09-24 DIAGNOSIS — E282 Polycystic ovarian syndrome: Secondary | ICD-10-CM

## 2022-09-24 DIAGNOSIS — K219 Gastro-esophageal reflux disease without esophagitis: Secondary | ICD-10-CM

## 2022-09-24 DIAGNOSIS — K589 Irritable bowel syndrome without diarrhea: Secondary | ICD-10-CM

## 2022-09-24 DIAGNOSIS — R1031 Right lower quadrant pain: Secondary | ICD-10-CM | POA: Diagnosis not present

## 2022-09-24 DIAGNOSIS — R1032 Left lower quadrant pain: Secondary | ICD-10-CM

## 2022-09-24 DIAGNOSIS — G8929 Other chronic pain: Secondary | ICD-10-CM

## 2022-09-24 MED ORDER — PANTOPRAZOLE SODIUM 40 MG PO TBEC: 40.0000 mg | DELAYED_RELEASE_TABLET | Freq: Two times a day (BID) | ORAL | 6 refills | Status: DC

## 2022-09-24 MED ORDER — FAMOTIDINE 20 MG PO TABS: 20.0000 mg | ORAL_TABLET | ORAL | Status: DC | PRN

## 2022-09-24 MED ORDER — HYOSCYAMINE SULFATE 0.125 MG SL SUBL: 0.1250 mg | SUBLINGUAL_TABLET | Freq: Three times a day (TID) | SUBLINGUAL | 2 refills | Status: DC

## 2022-09-24 NOTE — Progress Notes (Signed)
Agree with the assessment and plan as outlined by Ellouise Newer, PA-C.  Will discuss potential TCA to help with chronic abdominal pain at her next follow up.  Danine Hor E. Candis Schatz, MD

## 2022-09-24 NOTE — Patient Instructions (Addendum)
We have sent the following medications to your pharmacy for you to pick up at your convenience: Pantoprazole: '40mg'$  Levsin: 0.'125mg'$  Famotidine: '20mg'$  (over the counter)  I recommend you get a second opinion OB/GYN.  We scheduled a follow up visit for you with Dr Candis Schatz on May 8th, 2024 at 10:30am, if you need to reschedule please call [phone] _______________________________________________________  If your blood pressure at your visit was 140/90 or greater, please contact your primary care physician to follow up on this.  _______________________________________________________  If you are age 61 or older, your body mass index should be between 23-30. Your Body mass index is 37.56 kg/m. If this is out of the aforementioned range listed, please consider follow up with your Primary Care Provider.  If you are age 31 or younger, your body mass index should be between 19-25. Your Body mass index is 37.56 kg/m. If this is out of the aformentioned range listed, please consider follow up with your Primary Care Provider.   ________________________________________________________  The [facility] providers would like to encourage you to use [facility] to communicate with providers for non-urgent requests or questions.  Due to long hold times on the telephone, sending your provider a message by [facility] may be a faster and more efficient way to get a response.  Please allow 48 business hours for a response.  Please remember that this is for non-urgent requests.  _______________________________________________________ It was a pleasure to see you today!  Thank you for trusting me with your gastrointestinal care!

## 2022-09-24 NOTE — Progress Notes (Signed)
Chief Complaint: Follow-up abdominal pain and GERD  HPI:    Jacqueline Gonzalez is a 22 year old African-American female, assigned to Dr. Candis Schatz with a past medical history as listed below including reflux, IBS and depression, who returns to clinic today with a complaint of abdominal pain and GERD.   11/20/2021 pelvic ultrasound with submucosal fibroid versus polyp measuring 11 mm and numerous bilateral ovarian follicles with peripheral orientation, correlate for polycystic ovarian disease and trace fluid in the pelvis.     03/03/2022 patient seen in clinic and described that she was told that she likely had IBS with constipation daily.  She was having a bowel movement every once every 4 days or so which was hard to pass.  Linzess 72 mcg daily gave her terrible diarrhea.  Also describes seeing some bright red blood on the toilet paper mixed with stool.  She was also being worked up for PCOS.  On exam patient had an anal fissure.  She was tried on Amitiza 8 mcg twice a day with food.  Also prescribed Hyoscyamine 1 tab every 4-6 hours.  Prescribed Diltiazem 2% ointment 3 times daily x6 to 8 weeks for fissure.    03/09/2022-03/16/22 admission to the hospital for sepsis secondary to chlamydia PID and chlamydia proctocolitis associated with perirectal abscess.  She was treated with antibiotics Ceftriaxone and Flagyl as well as Doxycycline.  She did undergo I&D of supralevator rectal abscess on 8/26 with moderate proctitis.  Told to continue MiraLAX twice daily.  8/25 MRI of the pelvis without contrast showed progressive rectal wall thickening suspicious for infectious/inflammatory proctocolitis.    04/16/2022 CT the abdomen pelvis with contrast of the ED for ongoing abdominal pain.  This was normal.    04/30/2022 patient seen in clinic by me and at that time having issues with diarrhea, nausea, headache and vomiting as well as some blood and rectal pain.  Noted for with addition of Dicyclomine for the past week.  Was  using Diltiazem for fissure.  On exam continues small posterior fissure.  At that time reviewed multiple imaging studies most recently a CT in September 2023.  Rectal exam still with posterior fissure.  Discussed EGD and colonoscopy which were scheduled with Dr. [name].  Continued on Diltiazem, RectiCare with lidocaine and increase Dicyclomine to 20 4 times daily as well as started Omeprazole 40 every morning.    05/04/2022 colonoscopy with nonbleeding internal hemorrhoids and a small anal fissure.  EGD was normal with some gastritis.  Pathology showed mild chronic gastritis.  Told to use Omeprazole 40 mg p.o. twice daily for 8 weeks.    08/25/2022 pelvic ultrasound with findings suggesting polycystic ovarian syndrome.  Her gynecologist told her he did not feel that the PCOS was causing pain.    Today, the patient presents to clinic and tells me she does not recall taking Dicyclomine.  She does not think it ever really helped with her abdominal pain.  Tells me that she has had an increase in indigestion over the past few months regardless of taking Omeprazole (unsure what dose) every day and Famotidine (unsure what dose) every day.  She continues with reflux and indigestion issues typically worse in the morning.      Also continues with a constant achy crampy pain in her lower abdomen which is rated as a 6-7/10 constantly and then about every other day she will get sharp shooting pains which radiate from her lower abdomen into her vagina and can last for 15 minutes at  a time and seem to come to come and go.  Tells me her gynecologist does not think this is related to her diagnosed PCOS.    Denies fever, chills, weight loss or blood in her stool.  Past Medical History:  Diagnosis Date   Anemia    Anxiety    Chlamydia 11/13/2021   Constipation, chronic    Depression    Environmental allergies    Ganglion cyst of dorsum of left wrist    RECURRENT   GERD (gastroesophageal reflux disease)    watches diet    Headache    Hypertension    IBS (irritable bowel syndrome)    Lactose intolerance     Past Surgical History:  Procedure Laterality Date   GANGLION CYST EXCISION Left 04/24/2021   Procedure: left recurrent dorsal carpal ganglion excision;  Surgeon: [name], MD;  Location: [facility];  Service: Orthopedics;  Laterality: Left;   INCISION AND DRAINAGE PERIRECTAL ABSCESS N/A 03/14/2022   Procedure: IRRIGATION AND DEBRIDEMENT PERIRECTAL ABSCESS;  Surgeon: Michael Boston, MD;  Location: [facility];  Service: General;  Laterality: N/A;   TONSILLECTOMY AND ADENOIDECTOMY     AGE 3   WISDOM TOOTH EXTRACTION  03/18/2021   WRIST GANGLION EXCISION Left 02/2020    Current Outpatient Medications  Medication Sig Dispense Refill   albuterol (VENTOLIN HFA) 108 (90 Base) MCG/ACT inhaler Inhale 1-2 puffs into the lungs every 6 (six) hours as needed for wheezing or shortness of breath. 6.7 g 0   Erenumab-aooe (AIMOVIG) 140 MG/ML SOAJ Inject 140 mg into the skin every 28 (twenty-eight) days. 1.12 mL 11   FLUoxetine (PROZAC) 40 MG capsule Take 1 capsule (40 mg total) by mouth daily. (Patient taking differently: Take 40 mg by mouth every morning.) 90 capsule 3   fluticasone (FLONASE) 50 MCG/ACT nasal spray Place 2 sprays into both nostrils daily. 16 g 0   gabapentin (NEURONTIN) 100 MG capsule Take 100 mg by mouth 3 (three) times daily.     ibuprofen (ADVIL) 800 MG tablet Take 1 tablet (800 mg total) by mouth 3 (three) times daily. 21 tablet 0   medroxyPROGESTERone (PROVERA) 10 MG tablet 1 tab po qday x 14 days each month. You can expect a period to start after the finishing the 14 days. 42 tablet 3   Psyllium (METAMUCIL PO) Take 1 packet by mouth daily as needed (constipation).     tranexamic acid (LYSTEDA) 650 MG TABS tablet Take 2 tablets (1,300 mg total) by mouth 3 (three) times daily. Take during menses for a maximum of five days (Patient not taking: Reported on 09/17/2022) 30 tablet 0   No  current facility-administered medications for this visit.    Allergies as of 09/24/2022 - Review Complete 09/17/2022  Allergen Reaction Noted   Blue dyes (parenteral) Anaphylaxis 11/14/2016   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/14/2016   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   Shellfish allergy Anaphylaxis 06/17/2016   Amitriptyline Other (See Comments) 03/10/2022    Family History  Problem Relation Age of Onset   Migraines Mother    Irritable bowel syndrome Mother    Asthma Father    Ovarian cancer Maternal Aunt    Graves' disease Paternal Aunt    Diabetes Maternal Grandmother    Stroke Paternal Grandmother    Diabetes Paternal Grandmother    Kidney disease Paternal Grandfather    Stomach cancer Neg Hx    Esophageal cancer Neg Hx    Colon cancer Neg Hx  Social History   Socioeconomic History   Marital status: Single    Spouse name: Not on file   Number of children: 0   Years of education: Not on file   Highest education level: Not on file  Occupational History   Occupation: student  Tobacco Use   Smoking status: Never   Smokeless tobacco: Never  Vaping Use   Vaping Use: Never used  Substance and Sexual Activity   Alcohol use: Yes    Comment: occ.   Drug use: Never   Sexual activity: Yes    Birth control/protection: OCP  Other Topics Concern   Not on file  Social History Narrative   Not on file   Social Determinants of Health   Financial Resource Strain: Not on file  Food Insecurity: Not on file  Transportation Needs: Not on file  Physical Activity: Not on file  Stress: No Stress Concern Present (07/04/2021)   [place]    Feeling of Stress : Only a little  Recent Concern: Stress - Stress Concern Present (06/20/2021)   [place]    Feeling of Stress : To some extent  Social Connections: Not on file   Intimate Partner Violence: Not on file    Review of Systems:    Constitutional: No weight loss, fever or chills Cardiovascular: No chest pain   Respiratory: No SOB  Gastrointestinal: See HPI and otherwise negative   Physical Exam:  Vital signs: BP 120/80   Pulse 93   Ht '5\' 8"'$  (1.727 m)   Wt 247 lb (112 kg)   SpO2 97%   BMI 37.56 kg/m    Constitutional:   Pleasant overweight AA female appears to be in NAD, Well developed, Well nourished, alert and cooperative Respiratory: Respirations even and unlabored. Lungs clear to auscultation bilaterally.   No wheezes, crackles, or rhonchi.  Cardiovascular: Normal S1, S2. No MRG. Regular rate and rhythm. No peripheral edema, cyanosis or pallor.  Gastrointestinal:  Soft, nondistended, moderate generalized TTP some worse in the epigastrium and pelvic region. No rebound or guarding. Normal bowel sounds. No appreciable masses or hepatomegaly. Rectal:  Not performed.  Psychiatric: Demonstrates good judgement and reason without abnormal affect or behaviors.  RELEVANT LABS AND IMAGING: CBC    Component Value Date/Time   WBC 6.7 04/20/2022 0030   RBC 4.06 04/20/2022 0030   HGB 10.3 (L) 04/20/2022 0030   HGB 9.7 (L) 11/17/2021 1357   HCT 32.5 (L) 04/20/2022 0030   HCT 28.7 (L) 11/17/2021 1357   PLT 262 04/20/2022 0030   PLT 307 11/17/2021 1357   MCV 80.0 04/20/2022 0030   MCV 84 11/17/2021 1357   MCH 25.4 (L) 04/20/2022 0030   MCHC 31.7 04/20/2022 0030   RDW 16.3 (H) 04/20/2022 0030   RDW 12.8 11/17/2021 1357   LYMPHSABS 4.0 03/16/2022 0418   MONOABS 0.4 03/16/2022 0418   EOSABS 0.1 03/16/2022 0418   BASOSABS 0.0 03/16/2022 0418    CMP     Component Value Date/Time   NA 137 04/20/2022 0030   NA 138 08/28/2020 1600   K 3.6 04/20/2022 0030   CL 106 04/20/2022 0030   CO2 22 04/20/2022 0030   GLUCOSE 101 (H) 04/20/2022 0030   BUN 9 04/20/2022 0030   BUN 6 08/28/2020 1600   CREATININE 0.71 04/20/2022 0030   CALCIUM 9.1  04/20/2022 0030   PROT 7.2 04/20/2022 0030   PROT 7.6  08/28/2020 1600   ALBUMIN 4.0 04/20/2022 0030   ALBUMIN 4.3 08/28/2020 1600   AST 15 04/20/2022 0030   ALT 18 04/20/2022 0030   ALKPHOS 66 04/20/2022 0030   BILITOT 0.2 (L) 04/20/2022 0030   BILITOT 0.2 08/28/2020 1600   GFRNONAA >60 04/20/2022 0030   GFRAA 147 08/28/2020 1600    Assessment: 1.  GERD: Increased indigestion regardless of Omeprazole and Famotidine daily, recent EGD with gastritis; likely gastritis +/- functional gastritis 2.  IBS: Radiates back-and-forth with bowel movements along with chronic lower abdominal pain 3.  Chronic pelvic pain 4.  PCOS  Plan: 1.  We will restart Hyoscyamine sulfate 0.125 mg sublingual tabs 4 times daily, 20 to 30 minutes before meals and at bedtime.  Prescribed #120 with 2 refills 2.  Also changed patient from Omeprazole to Pantoprazole 40 mg twice daily, 30-60 minutes before breakfast and dinner for reflux symptoms #60 with 5 refills 3.  Patient can continue Famotidine as needed 1-2 times a day for breakthrough the reflux 4.  Did discuss with patient that she may want to get a second opinion as far as her OB/GYN and PCOS.  Certainly this can cause some pain. 5.  Reviewed her EGD and colonoscopy.  No findings to explain pain. 6.  Patient will follow with Dr. Candis Schatz in 2 to 3 months.  She has never met him.  Jacqueline Newer, PA-C [facility] 09/24/2022, 11:07 AM  Cc: Donney Dice, DO

## 2022-10-18 ENCOUNTER — Encounter (HOSPITAL_BASED_OUTPATIENT_CLINIC_OR_DEPARTMENT_OTHER): Payer: Self-pay

## 2022-10-18 ENCOUNTER — Emergency Department (HOSPITAL_BASED_OUTPATIENT_CLINIC_OR_DEPARTMENT_OTHER): Payer: Medicaid Other

## 2022-10-18 ENCOUNTER — Emergency Department (HOSPITAL_BASED_OUTPATIENT_CLINIC_OR_DEPARTMENT_OTHER)
Admission: EM | Admit: 2022-10-18 | Discharge: 2022-10-18 | Disposition: A | Discharge status: Home / Self Care | Payer: Medicaid Other | Attending: Emergency Medicine | Admitting: Emergency Medicine

## 2022-10-18 DIAGNOSIS — Z1152 Encounter for screening for COVID-19: Secondary | ICD-10-CM | POA: Diagnosis not present

## 2022-10-18 DIAGNOSIS — K047 Periapical abscess without sinus: Secondary | ICD-10-CM | POA: Insufficient documentation

## 2022-10-18 DIAGNOSIS — R519 Headache, unspecified: Secondary | ICD-10-CM | POA: Diagnosis present

## 2022-10-18 DIAGNOSIS — J029 Acute pharyngitis, unspecified: Secondary | ICD-10-CM | POA: Diagnosis not present

## 2022-10-18 LAB — GROUP A STREP BY PCR: Group A Strep by PCR: NOT DETECTED

## 2022-10-18 LAB — SARS CORONAVIRUS 2 BY RT PCR: SARS Coronavirus 2 by RT PCR: NEGATIVE

## 2022-10-18 MED ORDER — AMOXICILLIN 500 MG PO CAPS
500.0000 mg | ORAL_CAPSULE | Freq: Once | ORAL | Status: AC
  Administered 2022-10-18: 500 mg via ORAL
  Filled 2022-10-18: qty 1

## 2022-10-18 MED ORDER — BENZONATATE 100 MG PO CAPS: 100.0000 mg | ORAL_CAPSULE | Freq: Three times a day (TID) | ORAL | 0 refills | Status: DC

## 2022-10-18 MED ORDER — AMOXICILLIN 500 MG PO CAPS: 500.0000 mg | ORAL_CAPSULE | Freq: Three times a day (TID) | ORAL | 0 refills | Status: DC

## 2022-10-18 MED ORDER — FLUCONAZOLE 150 MG PO TABS: 150.0000 mg | ORAL_TABLET | Freq: Once | ORAL | 0 refills | Status: AC

## 2022-10-18 MED ORDER — BENZONATATE 100 MG PO CAPS
100.0000 mg | ORAL_CAPSULE | Freq: Once | ORAL | Status: AC
  Administered 2022-10-18: 100 mg via ORAL
  Filled 2022-10-18: qty 1

## 2022-10-18 MED ORDER — CETIRIZINE HCL 10 MG PO TABS: 10.0000 mg | ORAL_TABLET | Freq: Every day | ORAL | 0 refills | Status: AC

## 2022-10-18 NOTE — ED Provider Notes (Signed)
[facility] Provider Note   CSN: [id number] Arrival date & time: 10/18/22  2024     History  Chief Complaint  Patient presents with   Sore Throat   Headache    Jacqueline Gonzalez is a 22 y.o. female with a past medical history of IBS, irregular menses and PCOS presenting with sore throat, headache, jaw pain, abdominal cramping and chills for the past week.  Reports that multiple family members have a cough but they denied being sick.  She was seen at urgent care few days ago and they prescribed her Tessalon and lidocaine solution but she was unable to afford these medications.   Sore Throat Associated symptoms include headaches.  Headache      Home Medications Prior to Admission medications   Medication Sig Start Date End Date Taking? Authorizing Provider  amoxicillin (AMOXIL) 500 MG capsule Take 1 capsule (500 mg total) by mouth 3 (three) times daily. 10/18/22  Yes Livana Yerian A, PA-C  benzonatate (TESSALON) 100 MG capsule Take 1 capsule (100 mg total) by mouth every 8 (eight) hours. 10/18/22  Yes Lativia Velie A, PA-C  cetirizine (ZYRTEC) 10 MG tablet Take 1 tablet (10 mg total) by mouth daily. 10/18/22  Yes Lawsen Arnott A, PA-C  fluconazole (DIFLUCAN) 150 MG tablet Take 1 tablet (150 mg total) by mouth once for 1 dose. 10/18/22 10/18/22 Yes Farid Grigorian A, PA-C  albuterol (VENTOLIN HFA) 108 (90 Base) MCG/ACT inhaler Inhale 1-2 puffs into the lungs every 6 (six) hours as needed for wheezing or shortness of breath. 06/27/22   Gildardo Pounds, NP  diclofenac Sodium (VOLTAREN) 1 % GEL Apply 2 g topically 4 (four) times daily.    [provider]  Erenumab-aooe (AIMOVIG) 140 MG/ML SOAJ Inject 140 mg into the skin every 28 (twenty-eight) days. 06/08/22   Pieter Partridge, DO  famotidine (PEPCID) 20 MG tablet Take 1 tablet (20 mg total) by mouth as needed for heartburn or indigestion. 09/24/22   Levin Erp, PA   FLUoxetine (PROZAC) 40 MG capsule Take 1 capsule (40 mg total) by mouth daily. Patient taking differently: Take 40 mg by mouth every morning. 12/31/21   Shary Key, DO  fluticasone (FLONASE) 50 MCG/ACT nasal spray Place 2 sprays into both nostrils daily. 06/27/22   Gildardo Pounds, NP  gabapentin (NEURONTIN) 100 MG capsule Take 100 mg by mouth 3 (three) times daily. 08/13/22   [provider]  hyoscyamine (LEVSIN SL) 0.125 MG SL tablet Place 1 tablet (0.125 mg total) under the tongue 4 (four) times daily -  before meals and at bedtime. Take 20- 30 minutes before meals and bedtime 09/24/22   Levin Erp, PA  ibuprofen (ADVIL) 800 MG tablet Take 1 tablet (800 mg total) by mouth 3 (three) times daily. 06/04/22   Luvenia Heller, PA-C  medroxyPROGESTERone (PROVERA) 10 MG tablet 1 tab po qday x 14 days each month. You can expect a period to start after the finishing the 14 days. 08/18/22   Aletha Halim, MD  pantoprazole (PROTONIX) 40 MG tablet Take 1 tablet (40 mg total) by mouth 2 (two) times daily before a meal. 09/24/22   Lemmon, Lavone Nian, PA  Psyllium (METAMUCIL PO) Take 1 packet by mouth daily as needed (constipation).    [provider]  tranexamic acid (LYSTEDA) 650 MG TABS tablet Take 2 tablets (1,300 mg total) by mouth 3 (three) times daily. Take during menses for a maximum of  five days 08/18/22   Aletha Halim, MD      Allergies    Blue dyes (parenteral), Blueberry [vaccinium angustifolium], Raspberry, Shellfish allergy, and Amitriptyline    Review of Systems   Review of Systems  Neurological:  Positive for headaches.    Physical Exam Updated Vital Signs BP 100/73   Pulse 89   Temp 98.5 F (36.9 C)   Resp 18   SpO2 100%  Physical Exam Vitals and nursing note reviewed.  Constitutional:      General: She is not in acute distress.    Appearance: Normal appearance. She is not ill-appearing.  HENT:     Head: Normocephalic and atraumatic.      Mouth/Throat:      Comments: Airway clear, tolerating secretions.  No erythema or exudate in the posterior oropharynx.  No signs of dental abscess, PTA or RPA.  She does have signs of an infected dental caries as noted above. Eyes:     General: No scleral icterus.    Conjunctiva/sclera: Conjunctivae normal.  Pulmonary:     Effort: Pulmonary effort is normal. No respiratory distress.  Skin:    Findings: No rash.  Neurological:     Mental Status: She is alert.  Psychiatric:        Mood and Affect: Mood normal.     ED Results / Procedures / Treatments   Labs (all labs ordered are listed, but only abnormal results are displayed) Labs Reviewed  GROUP A STREP BY PCR  SARS CORONAVIRUS 2 BY RT PCR    EKG None  Radiology DG Chest Portable 1 View  Result Date: 10/18/2022 CLINICAL DATA:  Cough EXAM: PORTABLE CHEST 1 VIEW COMPARISON:  None Available. FINDINGS: The heart size and mediastinal contours are within normal limits. Both lungs are clear. The visualized skeletal structures are unremarkable. IMPRESSION: No active disease. Electronically Signed   By: Fidela Salisbury M.D.   On: 10/18/2022 21:00    Procedures Procedures    Medications Ordered in ED Medications  benzonatate (TESSALON) capsule 100 mg (has no administration in time range)  amoxicillin (AMOXIL) capsule 500 mg (has no administration in time range)    ED Course/ Medical Decision Making/ A&P                             Medical Decision Making Amount and/or Complexity of Data Reviewed Radiology: ordered.  Risk OTC drugs. Prescription drug management.   22 year old female presenting today with sore throat, cough, headache and jaw pain.  Differential included but was not limited to viral illness, strep throat, mono, PE, pneumonia.  Imaging: X-ray ordered, viewed and interpreted by me.  I agree with radiology that there are no acute findings.  Physical exam: Only pertinent finding is an infected dental carry  that may be causing her sore throat and jaw pain.  No exudate or signs of candidal infection on physical exam.  No peripheral edema.  MDM/disposition: 22 year old female presenting with sore throat, headache, jaw pain and abdominal discomfort for at least a week.  Upon further questioning she reports abdominal problems for nearly a year.  She was seen by GI and diagnosed with gastritis and GERD on top of her IBS.  She is also seen OB/GYN due to irregular menses.  Her abdominal pain does not appear to be acute and she has no focal tenderness that make me believe that we need to do lab work or CT scan.  Additionally,  airway is clear and she is tolerating secretions.  I do not believe she has an infectious cause of her sore throat.  She does have a dental infection so I will start her on amoxicillin.  She was given dental resources, PCP and GI follow-up and was discharged home.  Final Clinical Impression(s) / ED Diagnoses Final diagnoses:  Dental infection    Rx / DC Orders ED Discharge Orders          Ordered    amoxicillin (AMOXIL) 500 MG capsule  3 times daily        10/18/22 2132    fluconazole (DIFLUCAN) 150 MG tablet   Once        10/18/22 2132    benzonatate (TESSALON) 100 MG capsule  Every 8 hours        10/18/22 2132    cetirizine (ZYRTEC) 10 MG tablet  Daily        10/18/22 2132           Results and diagnoses were explained to the patient. Return precautions discussed in full. Patient had no additional questions and expressed complete understanding.   This chart was dictated using voice recognition software.  Despite best efforts to proofread,  errors can occur which can change the documentation meaning.     Rhae Hammock, PA-C 10/18/22 2139    Leanord Asal K, DO 10/18/22 2236

## 2022-10-18 NOTE — Discharge Instructions (Addendum)
You came to the emergency department with a concern for sore throat, cough, headaches and abdominal discomfort.  Your COVID was negative and your chest x-ray did not show any signs of pneumonia.  You do not have strep throat.  As we discussed, you should follow-up with your gastroenterologist due to abdominal pain that has been ongoing for nearly a year.  It is also reasonable to see your PCP if your throat does not get better.  The lidocaine solution was a good option but you also may do warm teas, honey, sprays or throat lozenges.  Delsym and Robitussin are good options for cough over-the-counter but I also prescribed a pill called benzonatate to your pharmacy that you may use.  Additionally, please start taking an antihistamine such as Zyrtec.  I sent a generic for this to your pharmacy.  Do not hesitate to return with any worsening symptoms, otherwise please follow-up outpatient.  It was a pleasure to meet you and we hope you feel better!

## 2022-10-18 NOTE — ED Triage Notes (Signed)
Pt c/o "real bad HA, cough, sore throat, jaw pain/ swelling, stomach pains x1wk." Denies known fever. Ibuprofen for pain, last dose this AM. Advises recently being around family, "they were coughing but nobody said they were sick."

## 2022-11-02 ENCOUNTER — Ambulatory Visit: Payer: Medicaid Other | Admitting: Physical Therapy

## 2022-11-03 ENCOUNTER — Ambulatory Visit: Payer: Medicaid Other | Attending: Nurse Practitioner | Admitting: Physical Therapy

## 2022-11-03 ENCOUNTER — Encounter: Payer: Self-pay | Admitting: Physical Therapy

## 2022-11-03 ENCOUNTER — Other Ambulatory Visit: Payer: Self-pay

## 2022-11-03 DIAGNOSIS — G8929 Other chronic pain: Secondary | ICD-10-CM | POA: Diagnosis present

## 2022-11-03 DIAGNOSIS — M6281 Muscle weakness (generalized): Secondary | ICD-10-CM | POA: Diagnosis present

## 2022-11-03 DIAGNOSIS — M5459 Other low back pain: Secondary | ICD-10-CM | POA: Diagnosis present

## 2022-11-03 DIAGNOSIS — R293 Abnormal posture: Secondary | ICD-10-CM | POA: Diagnosis present

## 2022-11-03 DIAGNOSIS — R102 Pelvic and perineal pain: Secondary | ICD-10-CM | POA: Insufficient documentation

## 2022-11-03 DIAGNOSIS — Z8742 Personal history of other diseases of the female genital tract: Secondary | ICD-10-CM | POA: Diagnosis present

## 2022-11-03 DIAGNOSIS — M62838 Other muscle spasm: Secondary | ICD-10-CM | POA: Insufficient documentation

## 2022-11-03 DIAGNOSIS — R279 Unspecified lack of coordination: Secondary | ICD-10-CM | POA: Insufficient documentation
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
## 2022-11-10 ENCOUNTER — Ambulatory Visit: Payer: Medicaid Other | Admitting: Physical Therapy

## 2022-11-10 DIAGNOSIS — R293 Abnormal posture: Secondary | ICD-10-CM

## 2022-11-10 DIAGNOSIS — M6281 Muscle weakness (generalized): Secondary | ICD-10-CM

## 2022-11-10 DIAGNOSIS — R279 Unspecified lack of coordination: Secondary | ICD-10-CM

## 2022-11-10 DIAGNOSIS — M62838 Other muscle spasm: Secondary | ICD-10-CM

## 2022-11-10 NOTE — Patient Instructions (Signed)
[email or web address]   
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
## 2022-11-10 NOTE — Progress Notes (Unsigned)
Virtual Visit via Video Note  Consent was obtained for video visit:  Yes.   Answered questions that patient had about telehealth interaction:  Yes.   I discussed the limitations, risks, security and privacy concerns of performing an evaluation and management service by telemedicine. I also discussed with the patient that there may be a patient responsible charge related to this service. The patient expressed understanding and agreed to proceed.  Pt location: Home Physician Location: office Name of referring provider:  Reece Leader, DO I connected with Jacqueline Gonzalez at patients initiation/request on 11/11/2022 at  2:30 PM EDT by video enabled telemedicine application and verified that I am speaking with the correct person using two identifiers. Pt MRN:  [id number] Pt DOB:  [birth year] Video Participants:  Jacqueline Gonzalez   Assessment/Plan:   Chronic migraine without aura, without status migrainosus, intractable Chronic pain syndrome Sleep disturbance   I explained that she should not be followed by 2 specialists treating the same condition as it is not appropriate for patient care.  Since Dr. Neale Burly is a headache specialist, she will continue management with him.  She is concerned about her sleep problems, but I explained that I do not treat sleep disorders.  She also has chronic pain.  She has already had a workup which has been unremarkable and unfortunately, I do not have anything more to offer in regards to diagnosis.       Subjective:  Jacqueline Gonzalez is a 22 year old right-handed female with HTN, IBS, chronic constipation, anxiety and depression who follows up for migraine.  UPDATE: Bernita Raisin ineffective  She was to start Aimovig.  However, she has since established care with Dr. Neale Burly at the [facility], who instead started her on Emgality.   Rescue protocol:  Excedrin, also baclofen if severe; promethazine for nausea Current NSAIDS/analgesics:   ibuprofen Current triptans:  none Current ergotamine:  none Current anti-emetic:  none Current muscle relaxants:  none Current Antihypertensive medications:  none Current Antidepressant medications:  fluoxetine  daily Current Anticonvulsant medications: gabapentin  TID PRN (generalized pain) Current anti-CGRP:  Emgality Current Vitamins/Herbal/Supplements:  npne Current Antihistamines/Decongestants:  Zyrtec Other therapy:  trigger point injections/nerve blocks Birth control:  Provera       Caffeine:  none Diet:  does not drink much water.  Skips meals Exercise:  no Depression:  yes; Anxiety:  yes Other pain:  chronic back pain but over past year reports extremity pain, abdominal pain.  Numbness and tingling.  She had recent NCV-EMG which was unremarkable. Lumbar X-ray from 05/02/2021 unremarkable.   Sleep hygiene:  poor.  Difficulty falling asleep and staying asleep  HISTORY:  Onset:  2017.  Became daily early 2023 (been suffering from a pain syndrome- back pain, leg pain, abdominal pain) Location:  occipital moving to front, unilateral face, diffuse Quality:  ice pick to back of head; squeezing, aching, throbbing Intensity:  severe Aura:  absent Prodrome:  absent Associated symptoms:  nausea, sometimes vomiting, sometimes photophobia, sometimes dizziness.  She denies associated phonophobia, visual disturbance, unilateral numbness or weakness. Duration:  persistent Frequency:  daily/persistent (15 days are severe) Frequency of abortive medication: Baclofen no more than 2 days a week; takes Excedrin daily Triggers:  unknown Relieving factors:  nothing Activity:  cannot function about 15 days out of month   Past NSAIDS/analgesics:  acetaminophen, Excedrin, naproxen, ketorolac, meloxicam Past abortive triptans:  sumatriptan tab (shortness of breath, dizziness, nausea) Past abortive ergotamine:  none Past muscle relaxants:  Flexeril, baclofen  Past anti-emetic:   Zofran Past antihypertensive medications:  none Past antidepressant medications:  amitriptyline  QHS (allergy), duloxetine Past anticonvulsant medications:  zonisamide Past anti-CGRP:  none Past vitamins/Herbal/Supplements:  none Past antihistamines/decongestants:  none Other past therapies:  trigger point injections    Family history of headache:  mom (migraines), cousin (basilar migraine - passes out), uncle (cluster headache)  Past Medical History: Past Medical History:  Diagnosis Date   Anemia    Anxiety    Chlamydia 11/13/2021   Constipation, chronic    Depression    Environmental allergies    Ganglion cyst of dorsum of left wrist    RECURRENT   GERD (gastroesophageal reflux disease)    watches diet   Headache    Hypertension    IBS (irritable bowel syndrome)    Lactose intolerance     Medications: Outpatient Encounter Medications as of 11/11/2022  Medication Sig Note   albuterol (VENTOLIN HFA) 108 (90 Base) MCG/ACT inhaler Inhale 1-2 puffs into the lungs every 6 (six) hours as needed for wheezing or shortness of breath.    cetirizine (ZYRTEC) 10 MG tablet Take 1 tablet (10 mg total) by mouth daily. 11/11/2022: As needed   Erenumab-aooe (AIMOVIG) 140 MG/ML SOAJ Inject 140 mg into the skin every 28 (twenty-eight) days.    FLUoxetine (PROZAC) 40 MG capsule Take 1 capsule (40 mg total) by mouth daily. (Patient taking differently: Take 40 mg by mouth every morning.)    fluticasone (FLONASE) 50 MCG/ACT nasal spray Place 2 sprays into both nostrils daily. 11/11/2022: As needed   gabapentin (NEURONTIN) 100 MG capsule Take 100 mg by mouth 3 (three) times daily. 11/11/2022: As needed   hyoscyamine (LEVSIN SL) 0.125 MG SL tablet Place 1 tablet (0.125 mg total) under the tongue 4 (four) times daily -  before meals and at bedtime. Take 20- 30 minutes before meals and bedtime    ibuprofen (ADVIL) 800 MG tablet Take 1 tablet (800 mg total) by mouth 3 (three) times daily.     medroxyPROGESTERone (PROVERA) 10 MG tablet 1 tab po qday x 14 days each month. You can expect a period to start after the finishing the 14 days.    pantoprazole (PROTONIX) 40 MG tablet Take 1 tablet (40 mg total) by mouth 2 (two) times daily before a meal.    Psyllium (METAMUCIL PO) Take 1 packet by mouth daily as needed (constipation).    tranexamic acid (LYSTEDA) 650 MG TABS tablet Take 2 tablets (1,300 mg total) by mouth 3 (three) times daily. Take during menses for a maximum of five days 11/11/2022: As needed   [DISCONTINUED] amoxicillin (AMOXIL) 500 MG capsule Take 1 capsule (500 mg total) by mouth 3 (three) times daily.    [DISCONTINUED] benzonatate (TESSALON) 100 MG capsule Take 1 capsule (100 mg total) by mouth every 8 (eight) hours.    [DISCONTINUED] diclofenac Sodium (VOLTAREN) 1 % GEL Apply 2 g topically 4 (four) times daily.    [DISCONTINUED] famotidine (PEPCID) 20 MG tablet Take 1 tablet (20 mg total) by mouth as needed for heartburn or indigestion.    No facility-administered encounter medications on file as of 11/11/2022.    Allergies: Allergies  Allergen Reactions   Blue Dyes (Parenteral) Anaphylaxis   Blueberry [Vaccinium Angustifolium] Anaphylaxis   Raspberry Anaphylaxis   Shellfish Allergy Anaphylaxis    ALL SHELLFISH   Amitriptyline Other (See Comments)    Fast heart rate and throat closing    Family History: Family History  Problem Relation Age  of Onset   Migraines Mother    Irritable bowel syndrome Mother    Asthma Father    Ovarian cancer Maternal Aunt    Graves' disease Paternal Aunt    Diabetes Maternal Grandmother    Stroke Paternal Grandmother    Diabetes Paternal Grandmother    Kidney disease Paternal Grandfather    Stomach cancer Neg Hx    Esophageal cancer Neg Hx    Colon cancer Neg Hx     Observations/Objective:   No acute distress.  Alert and oriented.  Speech fluent and not dysarthric.  Language intact.     Follow Up Instructions:    -I  discussed the assessment and treatment plan with the patient. The patient was provided an opportunity to ask questions and all were answered. The patient agreed with the plan and demonstrated an understanding of the instructions.   The patient was advised to call back or seek an in-person evaluation if the symptoms worsen or if the condition fails to improve as anticipated.   Cira Servant, DO   CC: Reece Leader, DO

## 2022-11-11 ENCOUNTER — Telehealth: Payer: Medicaid Other | Admitting: Neurology

## 2022-11-11 DIAGNOSIS — G43719 Chronic migraine without aura, intractable, without status migrainosus: Secondary | ICD-10-CM

## 2022-11-11 DIAGNOSIS — G479 Sleep disorder, unspecified: Secondary | ICD-10-CM

## 2022-11-11 DIAGNOSIS — G894 Chronic pain syndrome: Secondary | ICD-10-CM | POA: Diagnosis not present

## 2022-11-17 ENCOUNTER — Ambulatory Visit: Payer: Medicaid Other | Admitting: Physical Therapy

## 2022-11-18 ENCOUNTER — Encounter: Payer: Self-pay | Admitting: Obstetrics and Gynecology

## 2022-11-18 ENCOUNTER — Ambulatory Visit: Payer: Medicaid Other | Admitting: Obstetrics and Gynecology

## 2022-11-18 VITALS — BP 124/76 | HR 87 | Ht 68.0 in | Wt 249.0 lb

## 2022-11-18 DIAGNOSIS — R87612 Low grade squamous intraepithelial lesion on cytologic smear of cervix (LGSIL): Secondary | ICD-10-CM | POA: Diagnosis not present

## 2022-11-18 DIAGNOSIS — N926 Irregular menstruation, unspecified: Secondary | ICD-10-CM | POA: Diagnosis not present

## 2022-11-18 DIAGNOSIS — K611 Rectal abscess: Secondary | ICD-10-CM

## 2022-11-18 DIAGNOSIS — Z8742 Personal history of other diseases of the female genital tract: Secondary | ICD-10-CM

## 2022-11-18 DIAGNOSIS — R1030 Lower abdominal pain, unspecified: Secondary | ICD-10-CM

## 2022-11-18 MED ORDER — TRANEXAMIC ACID 650 MG PO TABS: 1300.0000 mg | ORAL_TABLET | Freq: Three times a day (TID) | ORAL | 0 refills | Status: DC

## 2022-11-18 NOTE — Progress Notes (Unsigned)
CC: continued pelvic and stomach pain, did start PT, however patient stating that she is not any better

## 2022-11-18 NOTE — Progress Notes (Unsigned)
Obstetrics and Gynecology Visit Established Patient Evaluation  Appointment Date: 11/18/2022  Primary Care Provider: Reece Leader  OBGYN Clinic: [facility]  Chief Complaint: chronic pelvic/lower abdominal pain  History of Present Illness:  Jacqueline Gonzalez is a 22 y.o. with above chief complaint. Her past medical history is significant for h/o PID in August 2023 with h/o peri-rectal abscess and I&D by General Surgery, PCOS with oligomenorrhea, LSIL pap   Patient last seen by me in late Feb 2024 and I told her that her s/s are likely from the h/o PID, scar tissue and recommended pelvic floor PT, following up with GI and ensuring she has a regular menstrual cycle; her u/s at that time was wnl except with normal sized ovaries but c/w PCOS. She has been seeing PT but no change in s/s and she has seen GI and they are trying some different medications but nothing overt to explain her pain. She states she took the cyclic provera for 14 days and had a period a a few days later, which was in late March, and she states the period was heavy and painful for approximately a week; this was the first period she had since last year.   Interval History: Since that time, she states that pain continues, no VB or discharge  Review of Systems:  as noted in the History of Present Illness.  Patient Active Problem List   Diagnosis Date Noted   PCOS (polycystic ovarian syndrome) 08/26/2022   LGSIL of cervix of undetermined significance 08/26/2022   History of PID 08/18/2022   Rectal abscess supralevator s/p I&D 03/14/2022 03/14/2022   Rectal pain    UTI (urinary tract infection) 03/10/2022   Rectal bleeding    Anal fissure    Abnormal CT scan, colon    Proctocolitis 03/09/2022   Menorrhagia with irregular cycle 11/18/2021   Dysmenorrhea 11/18/2021   Chronic anemia 11/17/2021   Lower abdominal pain 11/07/2021   Moderate episode of recurrent major depressive disorder (HCC)  06/23/2021   Low back pain 05/02/2021   Headache 09/03/2020   Nonspecific abdominal pain 08/30/2020   Ganglion cyst of dorsum of left wrist 11/03/2019   Constipation, chronic 01/21/2018   Irregular periods 01/21/2018   Generalized anxiety disorder 01/21/2018   Medications:  Guinevere Ferrari had no medications administered during this visit. Current Outpatient Medications  Medication Sig Dispense Refill   albuterol (VENTOLIN HFA) 108 (90 Base) MCG/ACT inhaler Inhale 1-2 puffs into the lungs every 6 (six) hours as needed for wheezing or shortness of breath. 6.7 g 0   cetirizine (ZYRTEC) 10 MG tablet Take 1 tablet (10 mg total) by mouth daily. 30 tablet 0   Erenumab-aooe (AIMOVIG) 140 MG/ML SOAJ Inject 140 mg into the skin every 28 (twenty-eight) days. 1.12 mL 11   FLUoxetine (PROZAC) 40 MG capsule Take 1 capsule (40 mg total) by mouth daily. (Patient taking differently: Take 40 mg by mouth every morning.) 90 capsule 3   fluticasone (FLONASE) 50 MCG/ACT nasal spray Place 2 sprays into both nostrils daily. 16 g 0   gabapentin (NEURONTIN) 100 MG capsule Take 100 mg by mouth 3 (three) times daily.     hyoscyamine (LEVSIN SL) 0.125 MG SL tablet Place 1 tablet (0.125 mg total) under the tongue 4 (four) times daily -  before meals and at bedtime. Take 20- 30 minutes before meals and bedtime 120 tablet 2   ibuprofen (ADVIL) 800 MG tablet Take 1 tablet (800 mg total) by mouth 3 (three)  times daily. 21 tablet 0   medroxyPROGESTERone (PROVERA) 10 MG tablet 1 tab po qday x 14 days each month. You can expect a period to start after the finishing the 14 days. 42 tablet 3   pantoprazole (PROTONIX) 40 MG tablet Take 1 tablet (40 mg total) by mouth 2 (two) times daily before a meal. 60 tablet 6   Psyllium (METAMUCIL PO) Take 1 packet by mouth daily as needed (constipation).     tranexamic acid (LYSTEDA) 650 MG TABS tablet Take 2 tablets (1,300 mg total) by mouth 3 (three) times daily. Take during menses for a  maximum of five days 30 tablet 0   No current facility-administered medications for this visit.    Allergies: is allergic to blue dyes (parenteral), blueberry [vaccinium angustifolium], raspberry, shellfish allergy, and amitriptyline.  Physical Exam:  BP 124/76   Pulse 87   Ht 5\' 8"  (1.727 m)   Wt 249 lb (112.9 kg)   BMI 37.86 kg/m  Body mass index is 37.86 kg/m. General appearance: Well nourished, well developed female in no acute distress.  Abdomen: diffusely non tender to palpation, non distended, and no masses, hernias Neuro/Psych:  Normal mood and affect.    Assessment: patient stable  Plan:  1. Lower abdominal pain Long d/w her that I don't see an obvious GYN cause as to her s/s. I told her that ensuring she has a regular, monthly cycle is important and that hopefully subsequent cycles are not as heavy and painful since it will not be as long an interval between cycles. I d/w her re: r/b/a to Lysteda PRN for cycles and she is amenable to PRN usage; I recommend she start taking the cycle provera today since it's the 1st of the month. I told her the only option I can offer her, at this point, is a diagnostic laparoscopy to evaluate for any endometriosis or pathology that could be causing her s/s. I told her that if there is any endometrosis that I would try and remove it if safe to do. I also d/w her that if there is any adhesions that data has not shown it to have long lasting benefit (more than a few months to a year) and that they are likely to come back.  I also d/w her re: consult at a pelvic pain clinic. Risk of surgery including infection, damage to surrounding structures such as bowel and bladder, risk of larger surgery, etc d/w her.   Patient would like to proceed with diagnostic laparoscopy for evaluation and she feels this may let GI know that there isn't a GYN source for the pain, and potentially seeing a chronic pelvic pain clinic later  2. History of PID Last STI  swab testing negative 08/2022  3. Irregular periods  4. LGSIL of cervix of undetermined significance D/w her to repeat 08/2023  5. Rectal abscess supralevator s/p I&D 03/14/2022   RTC: post op  No follow-ups on file.  Future Appointments  Date Time Provider Department Center  11/24/2022  2:45 PM Barbaraann Faster, PT [facility] None  11/25/2022 10:30 AM Jenel Lucks, MD [facility]  12/01/2022  4:15 PM Barbaraann Faster, PT [facility] None  12/08/2022  2:00 PM Barbaraann Faster, PT [facility] None  12/15/2022  2:45 PM Barbaraann Faster, PT [facility] None  12/22/2022  2:45 PM Barbaraann Faster, PT [facility] None  12/29/2022  3:30 PM Barbaraann Faster, PT [facility] None  01/05/2023  3:30 PM Barbaraann Faster, PT  [facility] None  01/08/2023  8:40 AM Rice, Jamesetta Orleans, MD [facility] None  01/12/2023  3:30 PM Barbaraann Faster, PT [facility] None    Cornelia Copa MD Attending [facility])

## 2022-11-19 ENCOUNTER — Ambulatory Visit: Payer: Medicaid Other | Admitting: Physical Therapy

## 2022-11-24 ENCOUNTER — Ambulatory Visit: Payer: Medicaid Other | Attending: Nurse Practitioner | Admitting: Physical Therapy

## 2022-11-24 ENCOUNTER — Encounter: Payer: Self-pay | Admitting: Physical Therapy

## 2022-11-24 DIAGNOSIS — R293 Abnormal posture: Secondary | ICD-10-CM | POA: Diagnosis present

## 2022-11-24 DIAGNOSIS — R279 Unspecified lack of coordination: Secondary | ICD-10-CM

## 2022-11-24 DIAGNOSIS — M62838 Other muscle spasm: Secondary | ICD-10-CM | POA: Diagnosis present

## 2022-11-24 DIAGNOSIS — M6281 Muscle weakness (generalized): Secondary | ICD-10-CM | POA: Diagnosis present

## 2022-11-24 NOTE — Patient Instructions (Signed)
[email or web address]   
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
## 2022-11-25 ENCOUNTER — Encounter: Payer: Self-pay | Admitting: Gastroenterology

## 2022-11-25 ENCOUNTER — Ambulatory Visit: Payer: Medicaid Other | Admitting: Gastroenterology

## 2022-11-25 VITALS — BP 116/86 | HR 96 | Ht 67.0 in | Wt 249.4 lb

## 2022-11-25 DIAGNOSIS — K582 Mixed irritable bowel syndrome: Secondary | ICD-10-CM

## 2022-11-25 MED ORDER — MAHANA IBS MISC: 1 refills | Status: DC

## 2022-11-25 NOTE — Progress Notes (Signed)
HPI : Jacqueline Gonzalez is a pleasant 22 year old female with a history of anxiety, depression and IBS who presents to our office for follow up of abdominal pain.  She states that she was diagnosed with IBS when she was 22 years old. She was initially seen in our office in Aug 2023 by Hyacinth Meeker primarily with symptoms of constipation and bright red blood per rectum and pain with passage of stool.  She was noted to have an anal fissure on exam Her Linzess 72 mcg was changed to Amitiza 8 mcg BID because Linzess was causing diarrhea She was prescribed Hyocyamine for abdominal pain and Diltiazem 2% for anal fissure, plus recticare.  She was admitted Aug 21-28 for pelvic sepsis secondary to chlamydia PID and proctocolitis with perirectal abscess She was treated with Ceftriaxone and Flagyl as well as Doxycycline.  She did undergo I&D of supralevator rectal abscess on 8/26 with moderate proctitis.  Told to continue MiraLAX twice daily.  8/25 MRI of the pelvis without contrast showed progressive rectal wall thickening suspicious for infectious/inflammatory proctocolitis.  She was seen in the ED for ongoing abdominal pain on 04/16/2022.  A CT abdomen pelvis with contrast was normal, with resolution of the inflammatory changes in the rectum.  She was seen in our office for follow up in Oct 2023, having issues with diarrhea, nausea, headache, vomiting, generalized abdominal discomfort.  She was scheduled for an EGD and colonoscopy which were performed Dr. Leonides Schanz on May 04, 2022 which were unremarkable.    She was seen again in followup Sep 24, 2022 with ongoing abdominal pain, at which time she was started on hyoscyamine QID.  Her PPI was changed from omeprazole to pantoprazole with PRN famotidine.  Today, she reports that she is the same. She says she has constant abdominal pain.  It is usually a 7/10 but can be 9-9.5/10.  The pain involves her entire abdomen and back.  Nothing improves the pain or  provides any relief.  She takes the hyoscyamine 1-2x/day but it does not help.  When asked what makes the pain worse, she replies 'anything'.  Pain is not necessarily worsened with food and she has not identified any particular foods that cause more symptoms than others. She says that she has had constant abdominal pain for about a year now.  Her bowel movements are irregular, sometimes with multiple stools in a day, sometimes going several days between bowel movements.  Her stools vary in consistency.  She does not take psyllium regularly.  Her fissure symptoms have resolved.  She follows with [facility] ("Dr. Marquis Lunch").  She has been on Prozac for a long time.  She states her depression and anxiety are 'not good'.   EGD May 04, 2022 - Normal esophagus. Biopsied. - Gastritis. Biopsied. - Normal examined duodenum. Biopsied.  Colonoscopy May 04, 2022 - The examined portion of the ileum was normal.  - Biopsies were taken with a cold forceps from the entire colon for evaluation of microscopic colitis.  - Non-bleeding internal hemorrhoids.  - Small anal fissure found on perianal exam.   CT abdomen w/ contrast Sept 28, 2023 IMPRESSION: Normal enhanced CT scan of the abdomen and pelvis.  Pelvic US Aug 25, 2022 IMPRESSION: Findings suggest polycystic ovary syndrome.  Otherwise unremarkable  Past Medical History:  Diagnosis Date   Anemia    Anxiety    Chlamydia 11/13/2021   Constipation, chronic    Depression    Environmental allergies  Ganglion cyst of dorsum of left wrist    RECURRENT   GERD (gastroesophageal reflux disease)    watches diet   Headache    Hypertension    IBS (irritable bowel syndrome)    Lactose intolerance      Past Surgical History:  Procedure Laterality Date   GANGLION CYST EXCISION Left 04/24/2021   Procedure: left recurrent dorsal carpal ganglion excision;  Surgeon: Gomez Cleverly, MD;  Location: [facility];  Service: Orthopedics;   Laterality: Left;   INCISION AND DRAINAGE PERIRECTAL ABSCESS N/A 03/14/2022   Procedure: IRRIGATION AND DEBRIDEMENT PERIRECTAL ABSCESS;  Surgeon: Karie Soda, MD;  Location: [facility];  Service: General;  Laterality: N/A;   TONSILLECTOMY AND ADENOIDECTOMY     AGE 26   WISDOM TOOTH EXTRACTION  03/18/2021   WRIST GANGLION EXCISION Left 02/2020   Family History  Problem Relation Age of Onset   Migraines Mother    Irritable bowel syndrome Mother    Asthma Father    Ovarian cancer Maternal Aunt    Graves' disease Paternal Aunt    Diabetes Maternal Grandmother    Stroke Paternal Grandmother    Diabetes Paternal Grandmother    Kidney disease Paternal Grandfather    Stomach cancer Neg Hx    Esophageal cancer Neg Hx    Colon cancer Neg Hx    Social History   Tobacco Use   Smoking status: Never   Smokeless tobacco: Never  Vaping Use   Vaping Use: Never used  Substance Use Topics   Alcohol use: Yes    Comment: occ.   Drug use: Never   Current Outpatient Medications  Medication Sig Dispense Refill   albuterol (VENTOLIN HFA) 108 (90 Base) MCG/ACT inhaler Inhale 1-2 puffs into the lungs every 6 (six) hours as needed for wheezing or shortness of breath. 6.7 g 0   cetirizine (ZYRTEC) 10 MG tablet Take 1 tablet (10 mg total) by mouth daily. 30 tablet 0   FLUoxetine (PROZAC) 40 MG capsule Take 1 capsule (40 mg total) by mouth daily. (Patient taking differently: Take 40 mg by mouth every morning.) 90 capsule 3   fluticasone (FLONASE) 50 MCG/ACT nasal spray Place 2 sprays into both nostrils daily. 16 g 0   gabapentin (NEURONTIN) 100 MG capsule Take 100 mg by mouth 3 (three) times daily.     Galcanezumab-gnlm (EMGALITY Quintana) Inject 1 Dose into the skin every 30 (thirty) days.     hyoscyamine (LEVSIN SL) 0.125 MG SL tablet Place 1 tablet (0.125 mg total) under the tongue 4 (four) times daily -  before meals and at bedtime. Take 20- 30 minutes before meals and bedtime 120 tablet 2   ibuprofen (ADVIL)  800 MG tablet Take 1 tablet (800 mg total) by mouth 3 (three) times daily. 21 tablet 0   medroxyPROGESTERone (PROVERA) 10 MG tablet 1 tab po qday x 14 days each month. You can expect a period to start after the finishing the 14 days. 42 tablet 3   pantoprazole (PROTONIX) 40 MG tablet Take 1 tablet (40 mg total) by mouth 2 (two) times daily before a meal. 60 tablet 6   Psyllium (METAMUCIL PO) Take 1 packet by mouth daily as needed (constipation).     tranexamic acid (LYSTEDA) 650 MG TABS tablet Take 2 tablets (1,300 mg total) by mouth 3 (three) times daily. Take during menses for a maximum of five days 30 tablet 0   ADVAIR DISKUS 100-50 MCG/ACT AEPB Inhale 1 puff into the  lungs 2 (two) times daily. (Patient not taking: Reported on 11/25/2022)     No current facility-administered medications for this visit.   Allergies  Allergen Reactions   Blue Dyes (Parenteral) Anaphylaxis   Blueberry [Vaccinium Angustifolium] Anaphylaxis   Raspberry Anaphylaxis   Shellfish Allergy Anaphylaxis    ALL SHELLFISH   Amitriptyline Other (See Comments)    Fast heart rate and throat closing     Review of Systems: All systems reviewed and negative except where noted in HPI.    No results found.  Physical Exam: BP 116/86 (BP Location: Left Arm, Patient Position: Sitting, Cuff Size: Normal)   Pulse 96   Ht 5\' 7"  (1.702 m)   Wt 249 lb 6 oz (113.1 kg)   BMI 39.06 kg/m  Constitutional: Pleasant,well-developed, African American female in no acute distress. HEENT: Normocephalic and atraumatic. Conjunctivae are normal. No scleral icterus. Neck supple.  Cardiovascular: Normal rate, regular rhythm.  Pulmonary/chest: Effort normal and breath sounds normal. No wheezing, rales or rhonchi. Abdominal: Soft, nondistended, diffuse tenderness to light palpation throughout the abdomen without rigidity or guarding. Bowel sounds active throughout. There are no masses palpable. No hepatomegaly. Extremities: no  edema Lymphadenopathy: No cervical adenopathy noted. Neurological: Alert and oriented to person place and time. Skin: Skin is warm and dry. No rashes noted. Psychiatric: Depressed mood with flat affect. Behavior is normal.  CBC    Component Value Date/Time   WBC 6.7 04/20/2022 0030   RBC 4.06 04/20/2022 0030   HGB 10.3 (L) 04/20/2022 0030   HGB 9.7 (L) 11/17/2021 1357   HCT 32.5 (L) 04/20/2022 0030   HCT 28.7 (L) 11/17/2021 1357   PLT 262 04/20/2022 0030   PLT 307 11/17/2021 1357   MCV 80.0 04/20/2022 0030   MCV 84 11/17/2021 1357   MCH 25.4 (L) 04/20/2022 0030   MCHC 31.7 04/20/2022 0030   RDW 16.3 (H) 04/20/2022 0030   RDW 12.8 11/17/2021 1357   LYMPHSABS 4.0 03/16/2022 0418   MONOABS 0.4 03/16/2022 0418   EOSABS 0.1 03/16/2022 0418   BASOSABS 0.0 03/16/2022 0418    CMP     Component Value Date/Time   NA 137 04/20/2022 0030   NA 138 08/28/2020 1600   K 3.6 04/20/2022 0030   CL 106 04/20/2022 0030   CO2 22 04/20/2022 0030   GLUCOSE 101 (H) 04/20/2022 0030   BUN 9 04/20/2022 0030   BUN 6 08/28/2020 1600   CREATININE 0.71 04/20/2022 0030   CALCIUM 9.1 04/20/2022 0030   PROT 7.2 04/20/2022 0030   PROT 7.6 08/28/2020 1600   ALBUMIN 4.0 04/20/2022 0030   ALBUMIN 4.3 08/28/2020 1600   AST 15 04/20/2022 0030   ALT 18 04/20/2022 0030   ALKPHOS 66 04/20/2022 0030   BILITOT 0.2 (L) 04/20/2022 0030   BILITOT 0.2 08/28/2020 1600   GFRNONAA >60 04/20/2022 0030   GFRAA 147 08/28/2020 1600     ASSESSMENT AND PLAN:  22 year old female with anxiety, depression, PCOS and previous diagnosis of IBS with persistent, constant abdominal pain for the past year, with recent episode of PID/pelvic sepsis, with subsequent normal CT, EGD, colonoscopy and pelvic ultrasound.   Her abdominal pain is most likely secondary to IBS/visceral hypersensitivity. We discussed the proposed pathophysiology of IBS and gut brain axis disorders in general.  We discussed management of IBS, to include  use of medications to improve bowel habits, as needed pain medicine, centrally acting neuromodulators, role of empiric dietary modifications to include a low FODMAP  diet gluten-free diet, as well as the role of cognitive therapies.  We discussed the goals of IBS management, namely to minimize the impact of GI symptoms on quality of life.  The patient was provided handouts with further information on IBS.  Although I think a TCA would be the next step in therapy for the patient's pain, she apparently has an allergy to amitriptyline.  Other agents used for IBS such as Cymbalta could be considered, but as the patient also has anxiety and depression that are not currently not adequately addressed with Prozac, I told her I did not feel comfortable starting a SNRI/SSRI, and that she needs to follow up with her behavioral health provider to consider other agents, and to consider therapies that also be helpful for chronic pain syndromes.  We did discuss the [facility] App and she was interested in this.  She will also look further into a low FODMAP diet or gluten free diet.   IBS - Unable to start TCA due to history of allergic reaction - Discussed low FODMAP vs GF diet - [employer] App - F/u with Behavioral Health to discuss other medications to treat depression/anxiety which may also help IBS (Cymbalta?) - Follow up 8-10 weeks  Alexys Gassett E. Tomasa Rand, MD [facility]  I spent a total of 35 minutes reviewing the patient's medical record, interviewing and examining the patient, discussing her diagnosis and management of her condition going forward, and documenting in the medical record  CC:  Ganta, Anupa, DO

## 2022-11-25 NOTE — Patient Instructions (Addendum)
_______________________________________________________  If your blood pressure at your visit was 140/90 or greater, please contact your primary care physician to follow up on this.  _______________________________________________________  If you are age 22 or older, your body mass index should be between 23-30. Your Body mass index is 39.06 kg/m. If this is out of the aforementioned range listed, please consider follow up with your Primary Care Provider.  If you are age 59 or younger, your body mass index should be between 19-25. Your Body mass index is 39.06 kg/m. If this is out of the aformentioned range listed, please consider follow up with your Primary Care Provider.   Follow up with Psychiatry   Please follow Low fodmap.   Follow up in 3 months.  Download the [facility] app and follow directions given.  The [facility] providers would like to encourage you to use [facility] to communicate with providers for non-urgent requests or questions.  Due to long hold times on the telephone, sending your provider a message by [facility] may be a faster and more efficient way to get a response.  Please allow 48 business hours for a response.  Please remember that this is for non-urgent requests.   It was a pleasure to see you today!  Thank you for trusting me with your gastrointestinal care!

## 2022-11-30 ENCOUNTER — Ambulatory Visit: Payer: Medicaid Other | Admitting: Physical Therapy

## 2022-11-30 DIAGNOSIS — R279 Unspecified lack of coordination: Secondary | ICD-10-CM

## 2022-11-30 DIAGNOSIS — R293 Abnormal posture: Secondary | ICD-10-CM

## 2022-11-30 DIAGNOSIS — M6281 Muscle weakness (generalized): Secondary | ICD-10-CM | POA: Diagnosis not present

## 2022-11-30 DIAGNOSIS — M62838 Other muscle spasm: Secondary | ICD-10-CM
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
## 2022-12-01 ENCOUNTER — Encounter: Payer: Medicaid Other | Admitting: Physical Therapy

## 2022-12-08 ENCOUNTER — Ambulatory Visit: Payer: Medicaid Other | Admitting: Physical Therapy

## 2022-12-08 DIAGNOSIS — M6281 Muscle weakness (generalized): Secondary | ICD-10-CM | POA: Diagnosis not present

## 2022-12-08 DIAGNOSIS — M62838 Other muscle spasm: Secondary | ICD-10-CM

## 2022-12-08 DIAGNOSIS — R293 Abnormal posture: Secondary | ICD-10-CM

## 2022-12-08 DIAGNOSIS — R279 Unspecified lack of coordination: Secondary | ICD-10-CM

## 2022-12-08 NOTE — Patient Instructions (Signed)
     [facility] Welcome to [facility]! Here you will find all the information you will need regarding your pool therapy. If you have further questions at any time, please call our office at [phone]. After completing your initial evaluation in the [facility], you may be eligible to complete a portion of your therapy in the pool. A typical week of therapy will consist of 1-2 typical physical therapy visits at our [facility] location and an additional session of therapy in the pool located at the [facility] at [address]. [address], [place] [address]. The phone number at the pool site is [phone]. Please call this number if you are running late or need to cancel your appointment.  Aquatic therapy will be offered on Wednesday mornings and Friday afternoons. Each session will last approximately 45 minutes. All scheduling and payments for aquatic therapy sessions, including cancelations, will be done through our [facility] location.  To be eligible for aquatic therapy, these criteria must be met: You must be able to independently change in the locker room and get to the pool deck. A caregiver can come with you to help if needed. There are benches for a caregiver to sit on next to the pool. No one with an open wound is permitted in the pool.  Handicap parking is available in the front and there is a drop off option for even closer accessibility. Please arrive 15 minutes prior to your appointment to prepare for your pool session. You must sign in at the front desk upon your arrival. Please be sure to attend to any toileting needs prior to entering the pool. Locker rooms for changing are available.  There is direct access to the pool deck from the locker room. You can lock your belongings in a locker or bring them with you poolside. Your therapist will greet you on the pool deck. There may be other swimmers in the pool at the  same time but your session is one-on-one with the therapist.   
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
## 2022-12-10 ENCOUNTER — Ambulatory Visit: Payer: Medicaid Other | Admitting: Cardiology

## 2022-12-10 ENCOUNTER — Encounter: Payer: Self-pay | Admitting: Cardiology

## 2022-12-10 VITALS — BP 115/75 | HR 84 | Resp 14 | Ht 67.0 in | Wt 252.0 lb

## 2022-12-10 DIAGNOSIS — E782 Mixed hyperlipidemia: Secondary | ICD-10-CM

## 2022-12-10 DIAGNOSIS — R0609 Other forms of dyspnea: Secondary | ICD-10-CM

## 2022-12-10 DIAGNOSIS — R072 Precordial pain: Secondary | ICD-10-CM

## 2022-12-10 DIAGNOSIS — R002 Palpitations: Secondary | ICD-10-CM

## 2022-12-10 NOTE — Progress Notes (Signed)
Patient referred by Reece Leader, DO for palpitations  Subjective:   Jacqueline Gonzalez, female    DOB: May 26, 2001, 22 y.o.   MRN: [id number]   Chief Complaint  Patient presents with   Palpitations   New Patient (Initial Visit)    Referred by Ida Rogue, NP    HPI  22 y.o. African-American female with palpitations, exertional dyspnea and chest pain  Patient is doing.  She has not had a physical activity.  She has noticed that she has had increased heart rate with minimal activity, associated with chest tightness and shortness of breath.  This also coincides with about 30 pound weight gain in the last month or so.  She denies any orthopnea, PND, leg edema. She does have family h/o heart failure.    Past Medical History:  Diagnosis Date   Anemia    Anxiety    Chlamydia 11/13/2021   Constipation, chronic    Depression    Environmental allergies    Ganglion cyst of dorsum of left wrist    RECURRENT   GERD (gastroesophageal reflux disease)    watches diet   Headache    Hypertension    IBS (irritable bowel syndrome)    Lactose intolerance      Past Surgical History:  Procedure Laterality Date   GANGLION CYST EXCISION Left 04/24/2021   Procedure: left recurrent dorsal carpal ganglion excision;  Surgeon: Gomez Cleverly, MD;  Location: [facility] [place];  Service: Orthopedics;  Laterality: Left;   INCISION AND DRAINAGE PERIRECTAL ABSCESS N/A 03/14/2022   Procedure: IRRIGATION AND DEBRIDEMENT PERIRECTAL ABSCESS;  Surgeon: Karie Soda, MD;  Location: [facility];  Service: General;  Laterality: N/A;   TONSILLECTOMY AND ADENOIDECTOMY     AGE 22   WISDOM TOOTH EXTRACTION  03/18/2021   WRIST GANGLION EXCISION Left 02/2020     Social History   Tobacco Use  Smoking Status Never  Smokeless Tobacco Never    Social History   Substance and Sexual Activity  Alcohol Use Yes   Comment: occ.     Family History  Problem Relation Age of Onset   Migraines Mother     Irritable bowel syndrome Mother    Asthma Father    Ovarian cancer Maternal Aunt    Graves' disease Paternal Aunt    Diabetes Maternal Grandmother    Stroke Paternal Grandmother    Diabetes Paternal Grandmother    Kidney disease Paternal Grandfather    Stomach cancer Neg Hx    Esophageal cancer Neg Hx    Colon cancer Neg Hx       Current Outpatient Medications:    ADVAIR DISKUS 100-50 MCG/ACT AEPB, Inhale 1 puff into the lungs 2 (two) times daily., Disp: , Rfl:    cetirizine (ZYRTEC) 10 MG tablet, Take 1 tablet (10 mg total) by mouth daily., Disp: 30 tablet, Rfl: 0   DTx App - Gastrointestinal [facility] IBS) MISC, Use as directed, Disp: 11 each, Rfl: 1   FLUoxetine (PROZAC) 40 MG capsule, Take 1 capsule (40 mg total) by mouth daily. (Patient taking differently: Take 40 mg by mouth every morning.), Disp: 90 capsule, Rfl: 3   fluticasone (FLONASE) 50 MCG/ACT nasal spray, Place 2 sprays into both nostrils daily., Disp: 16 g, Rfl: 0   Galcanezumab-gnlm (EMGALITY [place]), Inject 1 Dose into the skin every 30 (thirty) days., Disp: , Rfl:    ibuprofen (ADVIL) 800 MG tablet, Take 1 tablet (800 mg total) by mouth 3 (three) times daily., Disp: 21  tablet, Rfl: 0   medroxyPROGESTERone (PROVERA) 10 MG tablet, 1 tab po qday x 14 days each month. You can expect a period to start after the finishing the 14 days., Disp: 42 tablet, Rfl: 3   pantoprazole (PROTONIX) 40 MG tablet, Take 1 tablet (40 mg total) by mouth 2 (two) times daily before a meal., Disp: 60 tablet, Rfl: 6   Psyllium (METAMUCIL PO), Take 1 packet by mouth daily as needed (constipation)., Disp: , Rfl:    Cardiovascular and other pertinent studies:  Reviewed external labs and tests, independently interpreted  EKG 12/10/2022: Sinus rhythm 87 bpm  Early repolarization Nonspecific T-abnormality     Recent labs: 11/19/2022: Glucose 118, BUN/Cr 6/0.71. EGFR 124. Na/K 140/4.2. Rest of the CMP normal H/H 10/35. MCV 84. Platelets 250 HbA1C  NA% TSH 2.3 normal  04/20/2022: Glucose 101, BUN/Cr 9/0.71. EGFR >60. Na/K 137/3.6. Rest of the CMP normal H/H 10/32. MCV 80. Platelets 262  11/2021: TSH 6.3 high   Review of Systems  Cardiovascular:  Positive for chest pain and dyspnea on exertion. Negative for leg swelling, palpitations and syncope.         Vitals:   12/10/22 0944  BP: 115/75  Pulse: 84  Resp: 14  SpO2: 98%     Body mass index is 39.47 kg/m. Filed Weights   12/10/22 0944  Weight: 252 lb (114.3 kg)     Objective:   Physical Exam Vitals and nursing note reviewed.  Constitutional:      General: She is not in acute distress.    Appearance: She is obese.  Neck:     Vascular: No JVD.  Cardiovascular:     Rate and Rhythm: Normal rate and regular rhythm.     Heart sounds: Normal heart sounds. No murmur heard. Pulmonary:     Effort: Pulmonary effort is normal.     Breath sounds: Normal breath sounds. No wheezing or rales.  Musculoskeletal:     Right lower leg: No edema.     Left lower leg: No edema.          Visit diagnoses:   ICD-10-CM   1. Palpitations  R00.2 EKG 12-Lead    PCV ECHOCARDIOGRAM COMPLETE    PCV CARDIAC STRESS TEST    2. Exertional dyspnea  R06.09 Pro b natriuretic peptide (BNP)[id number]    PCV ECHOCARDIOGRAM COMPLETE    PCV CARDIAC STRESS TEST    3. Precordial pain  R07.2 PCV ECHOCARDIOGRAM COMPLETE    PCV CARDIAC STRESS TEST    4. Mixed hyperlipidemia  E78.2 Lipid panel       Orders Placed This Encounter  Procedures   EKG 12-Lead     Medication changes this visit: Medications Discontinued During This Encounter  Medication Reason   albuterol (VENTOLIN HFA) 108 (90 Base) MCG/ACT inhaler    gabapentin (NEURONTIN) 100 MG capsule    tranexamic acid (LYSTEDA) 650 MG TABS tablet    hyoscyamine (LEVSIN SL) 0.125 MG SL tablet     No orders of the defined types were placed in this encounter.    Assessment & Recommendations:    22 y.o.  African-American female with palpitations, exertional dyspnea and chest pain  Decreased exercise tolerance with exertional dyspnea and chest pain.  Physical exam is unremarkable.  Symptoms could well be due to obesity and deconditioning.  However, given her family history of heart failure, will obtain exercise treadmill stress test and echocardiogram.  She does me that she has known hyperlipidemia, though  I do not see any recent lipid panel.  I will order lipid panel.  Further recommendations after above testing  Thank you for referring the patient to [place]. Please feel free to contact with any questions.   Elder Negus, MD Pager: [phone] Office: [phone]

## 2022-12-15 ENCOUNTER — Encounter: Payer: Medicaid Other | Admitting: Physical Therapy

## 2022-12-16 ENCOUNTER — Ambulatory Visit (HOSPITAL_BASED_OUTPATIENT_CLINIC_OR_DEPARTMENT_OTHER): Payer: Medicaid Other | Attending: Nurse Practitioner | Admitting: Physical Therapy

## 2022-12-16 ENCOUNTER — Encounter (HOSPITAL_BASED_OUTPATIENT_CLINIC_OR_DEPARTMENT_OTHER): Payer: Self-pay | Admitting: Physical Therapy

## 2022-12-16 DIAGNOSIS — M62838 Other muscle spasm: Secondary | ICD-10-CM | POA: Insufficient documentation

## 2022-12-16 DIAGNOSIS — R279 Unspecified lack of coordination: Secondary | ICD-10-CM | POA: Diagnosis present

## 2022-12-16 DIAGNOSIS — M6281 Muscle weakness (generalized): Secondary | ICD-10-CM | POA: Insufficient documentation
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
## 2022-12-21 ENCOUNTER — Encounter (HOSPITAL_BASED_OUTPATIENT_CLINIC_OR_DEPARTMENT_OTHER): Payer: Medicaid Other | Admitting: Physical Therapy

## 2022-12-21 ENCOUNTER — Encounter (HOSPITAL_BASED_OUTPATIENT_CLINIC_OR_DEPARTMENT_OTHER): Payer: Self-pay

## 2022-12-22 ENCOUNTER — Encounter: Payer: Self-pay | Admitting: Obstetrics and Gynecology

## 2022-12-22 ENCOUNTER — Encounter: Payer: Medicaid Other | Admitting: Physical Therapy

## 2022-12-22 ENCOUNTER — Other Ambulatory Visit: Payer: Self-pay | Admitting: Obstetrics and Gynecology

## 2022-12-29 ENCOUNTER — Ambulatory Visit: Payer: Medicaid Other | Attending: Obstetrics and Gynecology | Admitting: Physical Therapy

## 2022-12-29 DIAGNOSIS — R279 Unspecified lack of coordination: Secondary | ICD-10-CM | POA: Diagnosis present

## 2022-12-29 DIAGNOSIS — M62838 Other muscle spasm: Secondary | ICD-10-CM

## 2022-12-29 DIAGNOSIS — M6281 Muscle weakness (generalized): Secondary | ICD-10-CM

## 2022-12-29 DIAGNOSIS — R293 Abnormal posture: Secondary | ICD-10-CM
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
## 2022-12-31 ENCOUNTER — Ambulatory Visit: Payer: Medicaid Other

## 2022-12-31 DIAGNOSIS — R002 Palpitations: Secondary | ICD-10-CM

## 2022-12-31 DIAGNOSIS — R072 Precordial pain: Secondary | ICD-10-CM

## 2022-12-31 DIAGNOSIS — R0609 Other forms of dyspnea: Secondary | ICD-10-CM

## 2023-01-01 ENCOUNTER — Other Ambulatory Visit: Payer: Self-pay

## 2023-01-01 DIAGNOSIS — I10 Essential (primary) hypertension: Secondary | ICD-10-CM | POA: Diagnosis not present

## 2023-01-01 DIAGNOSIS — R1030 Lower abdominal pain, unspecified: Secondary | ICD-10-CM | POA: Insufficient documentation

## 2023-01-01 DIAGNOSIS — E669 Obesity, unspecified: Secondary | ICD-10-CM | POA: Insufficient documentation

## 2023-01-01 DIAGNOSIS — R109 Unspecified abdominal pain: Secondary | ICD-10-CM | POA: Diagnosis present

## 2023-01-02 ENCOUNTER — Encounter (HOSPITAL_BASED_OUTPATIENT_CLINIC_OR_DEPARTMENT_OTHER): Payer: Self-pay | Admitting: Emergency Medicine

## 2023-01-02 ENCOUNTER — Emergency Department (HOSPITAL_BASED_OUTPATIENT_CLINIC_OR_DEPARTMENT_OTHER): Payer: Medicaid Other

## 2023-01-02 ENCOUNTER — Emergency Department (HOSPITAL_BASED_OUTPATIENT_CLINIC_OR_DEPARTMENT_OTHER)
Admission: EM | Admit: 2023-01-02 | Discharge: 2023-01-02 | Disposition: A | Discharge status: Home / Self Care | Payer: Medicaid Other | Attending: Emergency Medicine | Admitting: Emergency Medicine

## 2023-01-02 DIAGNOSIS — R109 Unspecified abdominal pain: Secondary | ICD-10-CM

## 2023-01-02 LAB — COMPREHENSIVE METABOLIC PANEL
ALT: 15 U/L (ref 0–44)
AST: 15 U/L (ref 15–41)
Albumin: 4.2 g/dL (ref 3.5–5.0)
Alkaline Phosphatase: 78 U/L (ref 38–126)
Anion gap: 9 (ref 5–15)
BUN: 11 mg/dL (ref 6–20)
CO2: 24 mmol/L (ref 22–32)
Calcium: 9.5 mg/dL (ref 8.9–10.3)
Chloride: 103 mmol/L (ref 98–111)
Creatinine, Ser: 0.76 mg/dL (ref 0.44–1.00)
GFR, Estimated: >60 mL/min (ref >60)
Glucose, Bld: 96 mg/dL (ref 70–99)
Potassium: 3.7 mmol/L (ref 3.5–5.1)
Sodium: 136 mmol/L (ref 135–145)
Total Bilirubin: 0.2 mg/dL — ABNORMAL LOW (ref 0.3–1.2)
Total Protein: 7.7 g/dL (ref 6.5–8.1)

## 2023-01-02 LAB — URINALYSIS, ROUTINE W REFLEX MICROSCOPIC
Bacteria, UA: NONE SEEN
Bilirubin Urine: NEGATIVE
Glucose, UA: NEGATIVE mg/dL
Hgb urine dipstick: NEGATIVE
Ketones, ur: NEGATIVE mg/dL
Nitrite: NEGATIVE
Specific Gravity, Urine: 1.027 (ref 1.005–1.030)
pH: 7 (ref 5.0–8.0)

## 2023-01-02 LAB — PREGNANCY, URINE: Preg Test, Ur: NEGATIVE

## 2023-01-02 LAB — CBC
HCT: 34.7 % — ABNORMAL LOW (ref 36.0–46.0)
Hemoglobin: 10.9 g/dL — ABNORMAL LOW (ref 12.0–15.0)
MCH: 24.9 pg — ABNORMAL LOW (ref 26.0–34.0)
MCHC: 31.4 g/dL (ref 30.0–36.0)
MCV: 79.4 fL — ABNORMAL LOW (ref 80.0–100.0)
Platelets: 282 10*3/uL (ref 150–400)
RBC: 4.37 MIL/uL (ref 3.87–5.11)
RDW: 13.8 % (ref 11.5–15.5)
WBC: 7.3 10*3/uL (ref 4.0–10.5)
nRBC: 0 % (ref 0.0–0.2)

## 2023-01-02 LAB — LIPASE, BLOOD: Lipase: <10 U/L — ABNORMAL LOW (ref 11–51)

## 2023-01-02 MED ORDER — OXYCODONE HCL 5 MG PO TABS: 5.0000 mg | ORAL_TABLET | ORAL | 0 refills | Status: DC | PRN

## 2023-01-02 MED ORDER — KETOROLAC TROMETHAMINE 15 MG/ML IJ SOLN
15.0000 mg | Freq: Once | INTRAMUSCULAR | Status: AC
  Administered 2023-01-02: 15 mg via INTRAVENOUS
  Filled 2023-01-02: qty 1

## 2023-01-02 MED ORDER — ONDANSETRON HCL 4 MG/2ML IJ SOLN
4.0000 mg | Freq: Once | INTRAMUSCULAR | Status: AC
  Administered 2023-01-02: 4 mg via INTRAVENOUS
  Filled 2023-01-02: qty 2

## 2023-01-02 MED ORDER — DICYCLOMINE HCL 20 MG PO TABS: 20.0000 mg | ORAL_TABLET | Freq: Two times a day (BID) | ORAL | 0 refills | Status: DC

## 2023-01-02 MED ORDER — SODIUM CHLORIDE 0.9 % IV BOLUS
1000.0000 mL | Freq: Once | INTRAVENOUS | Status: AC
  Administered 2023-01-02: 1000 mL via INTRAVENOUS

## 2023-01-02 MED ORDER — OXYCODONE HCL 5 MG PO TABS: 5.0000 mg | ORAL_TABLET | Freq: Once | ORAL | Status: DC

## 2023-01-02 MED ORDER — IOHEXOL 300 MG/ML  SOLN
100.0000 mL | Freq: Once | INTRAMUSCULAR | Status: AC | PRN
  Administered 2023-01-02: 100 mL via INTRAVENOUS

## 2023-01-02 MED ORDER — CIPROFLOXACIN-DEXAMETHASONE 0.3-0.1 % OT SUSP: 4.0000 [drp] | Freq: Once | OTIC | Status: DC

## 2023-01-02 MED ORDER — HYDROMORPHONE HCL 1 MG/ML IJ SOLN
1.0000 mg | Freq: Once | INTRAMUSCULAR | Status: AC
  Administered 2023-01-02: 1 mg via INTRAVENOUS
  Filled 2023-01-02: qty 1

## 2023-01-02 NOTE — ED Provider Notes (Signed)
[place] EMERGENCY DEPARTMENT AT [facility] Provider Note  CSN: [id number] Arrival date & time: 01/01/23 2351  Chief Complaint(s) Abdominal Pain  HPI Jacqueline Gonzalez is a 22 y.o. female with past medical history as below, significant for anxiety, depression, IBS, lactose intolerance, PCOS who presents to the ED with complaint of abdominal pain.  Ongoing abdominal/pelvic pain over the past few months, worse in the past week.  She was recently diagnosed with IBS by gastroenterology.  Dr. Tomasa Rand w/ .  She is compliant with her medications but has not experienced much relief of her chronic lower abdominal pain.  Symptoms of nausea, vomiting, intermittent diarrhea, no BRBPR or melena.  No abnormal vaginal bleeding or discharge.  No dysuria or hematuria.  No fevers or chills, no suspicious p.o. intake, no recent travel, recent antibiotics.  Pain described as constant, unremitting.  Unable to identify alleviating or exacerbating factors.  Her entire lower abdomen reported to be w/ pain; with occ radiation to her back/flanks  Scheduled for diagnostic laparoscopy next month with gynecology Dr. Vergie Living  Past Medical History Past Medical History:  Diagnosis Date   Anemia    Anxiety    Chlamydia 11/13/2021   Constipation, chronic    Depression    Environmental allergies    Ganglion cyst of dorsum of left wrist    RECURRENT   GERD (gastroesophageal reflux disease)    watches diet   Headache    Hypertension    IBS (irritable bowel syndrome)    Lactose intolerance    Patient Active Problem List   Diagnosis Date Noted   PCOS (polycystic ovarian syndrome) 08/26/2022   LGSIL of cervix of undetermined significance 08/26/2022   History of PID 08/18/2022   Rectal abscess supralevator s/p I&D 03/14/2022 03/14/2022   Rectal pain    UTI (urinary tract infection) 03/10/2022   Rectal bleeding    Anal fissure    Abnormal CT scan, colon    Proctocolitis 03/09/2022    Menorrhagia with irregular cycle 11/18/2021   Dysmenorrhea 11/18/2021   Chronic anemia 11/17/2021   Lower abdominal pain 11/07/2021   Moderate episode of recurrent major depressive disorder (HCC) 06/23/2021   Low back pain 05/02/2021   Headache 09/03/2020   Nonspecific abdominal pain 08/30/2020   Ganglion cyst of dorsum of left wrist 11/03/2019   Constipation, chronic 01/21/2018   Irregular periods 01/21/2018   Generalized anxiety disorder 01/21/2018   Home Medication(s) Prior to Admission medications   Medication Sig Start Date End Date Taking? Authorizing Provider  dicyclomine (BENTYL) 20 MG tablet Take 1 tablet (20 mg total) by mouth 2 (two) times daily for 7 days. 01/02/23 01/09/23 Yes Tanda Rockers A, DO  oxyCODONE (ROXICODONE) 5 MG immediate release tablet Take 1 tablet (5 mg total) by mouth every 4 (four) hours as needed for severe pain. 01/02/23  Yes Tanda Rockers A, DO  ADVAIR DISKUS 100-50 MCG/ACT AEPB Inhale 1 puff into the lungs 2 (two) times daily. 11/21/22   [provider]  cetirizine (ZYRTEC) 10 MG tablet Take 1 tablet (10 mg total) by mouth daily. 10/18/22   Redwine, Madison A, PA-C  DTx App - [facility] IBS) MISC Use as directed 11/25/22   Jenel Lucks, MD  FLUoxetine (PROZAC) 40 MG capsule Take 1 capsule (40 mg total) by mouth daily. Patient taking differently: Take 40 mg by mouth every morning. 12/31/21   Cora Collum, DO  fluticasone (FLONASE) 50 MCG/ACT nasal spray Place 2 sprays into both nostrils daily. 06/27/22  Claiborne Rigg, NP  Galcanezumab-gnlm [facility]) Inject 1 Dose into the skin every 30 (thirty) days.    [provider]  ibuprofen (ADVIL) 800 MG tablet Take 1 tablet (800 mg total) by mouth 3 (three) times daily. 06/04/22   Smitty Knudsen, PA-C  medroxyPROGESTERone (PROVERA) 10 MG tablet 1 tab po qday x 14 days each month. You can expect a period to start after the finishing the 14 days. 08/18/22   Sullivan Bing, MD   pantoprazole (PROTONIX) 40 MG tablet Take 1 tablet (40 mg total) by mouth 2 (two) times daily before a meal. 09/24/22   Lemmon, Violet Baldy, PA  Psyllium (METAMUCIL PO) Take 1 packet by mouth daily as needed (constipation).    [provider]                                                                                                                                    Past Surgical History Past Surgical History:  Procedure Laterality Date   GANGLION CYST EXCISION Left 04/24/2021   Procedure: left recurrent dorsal carpal ganglion excision;  Surgeon: Gomez Cleverly, MD;  Location: [facility];  Service: Orthopedics;  Laterality: Left;   INCISION AND DRAINAGE PERIRECTAL ABSCESS N/A 03/14/2022   Procedure: IRRIGATION AND DEBRIDEMENT PERIRECTAL ABSCESS;  Surgeon: Karie Soda, MD;  Location: [facility];  Service: General;  Laterality: N/A;   TONSILLECTOMY AND ADENOIDECTOMY     AGE 64   WISDOM TOOTH EXTRACTION  03/18/2021   WRIST GANGLION EXCISION Left 02/2020   Family History Family History  Problem Relation Age of Onset   Heart failure Mother    Migraines Mother    Irritable bowel syndrome Mother    Other Mother 41       Tricuspid valve regurgitation   Asthma Father    Ovarian cancer Maternal Aunt    Graves' disease Paternal Aunt    Hypertension Maternal Grandmother    Diabetes Maternal Grandmother    Hypertension Maternal Grandfather    Diabetes Maternal Grandfather    Hypertension Paternal Grandmother    Stroke Paternal Grandmother    Diabetes Paternal Grandmother    Hypertension Paternal Grandfather    Diabetes Paternal Grandfather    Kidney disease Paternal Grandfather    Stomach cancer Neg Hx    Esophageal cancer Neg Hx    Colon cancer Neg Hx     Social History Social History   Tobacco Use   Smoking status: Never   Smokeless tobacco: Never  Vaping Use   Vaping Use: Never used  Substance Use Topics   Alcohol use: Yes    Comment: occasionally    Drug use: Never    Comment: Smokes CBD   Allergies Blue dyes (parenteral), Blueberry [vaccinium angustifolium], Raspberry, Shellfish allergy, and Amitriptyline  Review of Systems Review of Systems  Constitutional:  Negative for activity change and fever.  HENT:  Negative for facial swelling  and trouble swallowing.   Eyes:  Negative for discharge and redness.  Respiratory:  Negative for cough and shortness of breath.   Cardiovascular:  Negative for chest pain and palpitations.  Gastrointestinal:  Positive for abdominal pain, diarrhea and nausea.  Genitourinary:  Negative for dysuria and flank pain.  Musculoskeletal:  Negative for back pain and gait problem.  Skin:  Negative for pallor and rash.  Neurological:  Negative for syncope and headaches.    Physical Exam Vital Signs  I have reviewed the triage vital signs BP 101/62   Pulse 81   Temp 98 F (36.7 C)   Resp 16   SpO2 97%  Physical Exam Vitals and nursing note reviewed.  Constitutional:      General: She is not in acute distress.    Appearance: Normal appearance. She is well-developed. She is obese.  HENT:     Head: Normocephalic and atraumatic.     Right Ear: External ear normal.     Left Ear: External ear normal.     Nose: Nose normal.     Mouth/Throat:     Mouth: Mucous membranes are moist.  Eyes:     General: No scleral icterus.       Right eye: No discharge.        Left eye: No discharge.  Cardiovascular:     Rate and Rhythm: Regular rhythm. Tachycardia present.     Pulses: Normal pulses.     Heart sounds: Normal heart sounds.  Pulmonary:     Effort: Pulmonary effort is normal. No respiratory distress.     Breath sounds: Normal breath sounds.  Abdominal:     General: Abdomen is flat.     Palpations: Abdomen is soft.     Tenderness: There is abdominal tenderness in the right lower quadrant, suprapubic area and left lower quadrant. There is no guarding or rebound.       Comments: Nonperitoneal   Musculoskeletal:        General: Normal range of motion.     Right lower leg: No edema.     Left lower leg: No edema.  Skin:    General: Skin is warm and dry.     Capillary Refill: Capillary refill takes less than 2 seconds.  Neurological:     Mental Status: She is alert and oriented to person, place, and time.     GCS: GCS eye subscore is 4. GCS verbal subscore is 5. GCS motor subscore is 6.  Psychiatric:        Mood and Affect: Mood normal.        Behavior: Behavior normal.     ED Results and Treatments Labs (all labs ordered are listed, but only abnormal results are displayed) Labs Reviewed  LIPASE, BLOOD - Abnormal; Notable for the following components:      Result Value   Lipase <10 (*)    All other components within normal limits  COMPREHENSIVE METABOLIC PANEL - Abnormal; Notable for the following components:   Total Bilirubin 0.2 (*)    All other components within normal limits  CBC - Abnormal; Notable for the following components:   Hemoglobin 10.9 (*)    HCT 34.7 (*)    MCV 79.4 (*)    MCH 24.9 (*)    All other components within normal limits  URINALYSIS, ROUTINE W REFLEX MICROSCOPIC - Abnormal; Notable for the following components:   Protein, ur TRACE (*)    Leukocytes,Ua TRACE (*)    All other  components within normal limits  PREGNANCY, URINE                                                                                                                          Radiology CT ABDOMEN PELVIS W CONTRAST  Result Date: 01/02/2023 CLINICAL DATA:  Acute abdominal pain EXAM: CT ABDOMEN AND PELVIS WITH CONTRAST TECHNIQUE: Multidetector CT imaging of the abdomen and pelvis was performed using the standard protocol following bolus administration of intravenous contrast. RADIATION DOSE REDUCTION: This exam was performed according to the departmental dose-optimization program which includes automated exposure control, adjustment of the mA and/or kV according to patient size  and/or use of iterative reconstruction technique. CONTRAST:  OMNIPAQUE IOHEXOL 300 MG/ML  SOLN COMPARISON:  04/16/2022 FINDINGS: Lower chest: No acute abnormality. Hepatobiliary: No focal liver abnormality is seen. No gallstones, gallbladder wall thickening, or biliary dilatation. Pancreas: Unremarkable. No pancreatic ductal dilatation or surrounding inflammatory changes. Spleen: Normal in size without focal abnormality. Adrenals/Urinary Tract: Adrenal glands are within normal limits. Kidneys demonstrate a normal enhancement pattern bilaterally. No renal calculi or obstructive changes are noted. The bladder is partially distended. Stomach/Bowel: Appendix is within normal limits. No obstructive or inflammatory changes of the colon are seen. Small bowel and stomach are within normal limits. Vascular/Lymphatic: No significant vascular findings are present. No enlarged abdominal or pelvic lymph nodes. Reproductive: Uterus and bilateral adnexa are unremarkable. Other: No abdominal wall hernia or abnormality. No abdominopelvic ascites. Musculoskeletal: No acute or significant osseous findings. IMPRESSION: No acute abnormality noted. Electronically Signed   By: Alcide Clever M.D.   On: 01/02/2023 02:38    Pertinent labs & imaging results that were available during my care of the patient were reviewed by me and considered in my medical decision making (see MDM for details).  Medications Ordered in ED Medications  iohexol (OMNIPAQUE) 300 MG/ML solution 100 mL (100 mLs Intravenous Contrast Given 01/02/23 0224)  HYDROmorphone (DILAUDID) injection 1 mg (1 mg Intravenous Given 01/02/23 0424)  sodium chloride 0.9 % bolus 1,000 mL (0 mLs Intravenous Stopped 01/02/23 0527)  ondansetron (ZOFRAN) injection 4 mg (4 mg Intravenous Given 01/02/23 0424)  ketorolac (TORADOL) 15 MG/ML injection 15 mg (15 mg Intravenous Given 01/02/23 0612)  Procedures Procedures  (including critical care time)  Medical Decision Making / ED Course    Medical Decision Making:    Jacqueline Gonzalez is a 22 y.o. female with past medical history as below, significant for anxiety, depression, IBS, lactose intolerance, PCOS who presents to the ED with complaint of abdominal pain.. The complaint involves an extensive differential diagnosis and also carries with it a high risk of complications and morbidity.  Serious etiology was considered. Ddx includes but is not limited to: Differential diagnosis includes but is not exclusive to ectopic pregnancy, ovarian cyst, ovarian torsion, acute appendicitis, urinary tract infection, endometriosis, bowel obstruction, hernia, colitis, renal colic, gastroenteritis, volvulus etc.   Complete initial physical exam performed, notably the patient  was no acute distress, resting comfortably on stretcher.  HDS.Marland Kitchen    Reviewed and confirmed nursing documentation for past medical history, family history, social history.  Vital signs reviewed.    Clinical Course as of 01/02/23 0724  Sat Jan 02, 2023  0724 Hemoglobin(!): 10.9 Similar to baseline [SG]  0724 Bacteria, UA: NONE SEEN [SG]  0724 Creatinine: 0.76 [SG]    Clinical Course User Index [SG] Sloan Leiter, DO    Labs reviewed and are stable, CT imaging reviewed and stable.  Patient with chronic abdominal pain past few months, intermittent diarrhea and nausea.  Recent diagnosed with IBS with her gastroenterologist.  She is scheduled for ex lap next month with gynecology Workup today is stable, she is tolerant p.o. to no difficulty.  Abdomen is nonperitoneal, she is HDS.  Labs stable.  Pain improved after intervention. Encouraged follow-up with gastroenterology and gynecology for ongoing care.  The patient's overall condition has improved, the patient presents with abdominal pain without signs of peritonitis, or other  life-threatening serious etiology. The patient understands that at this time there is no evidence for a more malignant underlying process. Detailed discussions were had with the patient regarding current findings, and need for close f/u with PCP or on call doctor/specialists. The patient appears stable for discharge and has been instructed to return immediately if the symptoms worsen in any way for re-evaluation. Patient verbalized understanding and is in agreement with current care plan.  All questions answered prior to discharge.            Additional history obtained: -Additional history obtained from spouse -External records from outside source obtained and reviewed including: Chart review including previous notes, labs, imaging, consultation notes including primary care documentation, home medications, prior labs and imaging   Lab Tests: -I ordered, reviewed, and interpreted labs.   The pertinent results include:   Labs Reviewed  LIPASE, BLOOD - Abnormal; Notable for the following components:      Result Value   Lipase <10 (*)    All other components within normal limits  COMPREHENSIVE METABOLIC PANEL - Abnormal; Notable for the following components:   Total Bilirubin 0.2 (*)    All other components within normal limits  CBC - Abnormal; Notable for the following components:   Hemoglobin 10.9 (*)    HCT 34.7 (*)    MCV 79.4 (*)    MCH 24.9 (*)    All other components within normal limits  URINALYSIS, ROUTINE W REFLEX MICROSCOPIC - Abnormal; Notable for the following components:   Protein, ur TRACE (*)    Leukocytes,Ua TRACE (*)    All other components within normal limits  PREGNANCY, URINE    Notable for labs stable  EKG   EKG Interpretation  Date/Time:  Ventricular Rate:    PR Interval:    QRS Duration:   QT Interval:    QTC Calculation:   R Axis:     Text Interpretation:           Imaging Studies ordered: I ordered imaging studies including CT  abdomen I independently visualized the following imaging with scope of interpretation limited to determining acute life threatening conditions related to emergency care; findings noted above, significant for stable imaging I independently visualized and interpreted imaging. I agree with the radiologist interpretation   Medicines ordered and prescription drug management: Meds ordered this encounter  Medications   iohexol (OMNIPAQUE) 300 MG/ML solution 100 mL   HYDROmorphone (DILAUDID) injection 1 mg   sodium chloride 0.9 % bolus 1,000 mL   ondansetron (ZOFRAN) injection 4 mg   DISCONTD: oxyCODONE (Oxy IR/ROXICODONE) immediate release tablet 5 mg   DISCONTD: ciprofloxacin-dexamethasone (CIPRODEX) 0.3-0.1 % OTIC (EAR) suspension 4 drop   ketorolac (TORADOL) 15 MG/ML injection 15 mg   oxyCODONE (ROXICODONE) 5 MG immediate release tablet    Sig: Take 1 tablet (5 mg total) by mouth every 4 (four) hours as needed for severe pain.    Dispense:  10 tablet    Refill:  0   dicyclomine (BENTYL) 20 MG tablet    Sig: Take 1 tablet (20 mg total) by mouth 2 (two) times daily for 7 days.    Dispense:  14 tablet    Refill:  0    -I have reviewed the patients home medicines and have made adjustments as needed   Consultations Obtained: na   Cardiac Monitoring: The patient was maintained on a cardiac monitor.  I personally viewed and interpreted the cardiac monitored which showed an underlying rhythm of: sinus tachy > NSR  Social Determinants of Health:  Diagnosis or treatment significantly limited by social determinants of health: obesity   Reevaluation: After the interventions noted above, I reevaluated the patient and found that they have improved  Co morbidities that complicate the patient evaluation  Past Medical History:  Diagnosis Date   Anemia    Anxiety    Chlamydia 11/13/2021   Constipation, chronic    Depression    Environmental allergies    Ganglion cyst of dorsum of left  wrist    RECURRENT   GERD (gastroesophageal reflux disease)    watches diet   Headache    Hypertension    IBS (irritable bowel syndrome)    Lactose intolerance       Dispostion: Disposition decision including need for hospitalization was considered, and patient discharged from emergency department.    Final Clinical Impression(s) / ED Diagnoses Final diagnoses:  Abdominal pain, unspecified abdominal location     This chart was dictated using voice recognition software.  Despite best efforts to proofread,  errors can occur which can change the documentation meaning.    Sloan Leiter, DO 01/02/23 [phone]

## 2023-01-02 NOTE — Discharge Instructions (Addendum)
It was a pleasure caring for you today in the emergency department.  Please follow up with gynecology and gastroenterology  Please return to the emergency department for any worsening or worrisome symptoms.

## 2023-01-02 NOTE — ED Notes (Signed)
Fluids complete. Pt. Drinking second cup of soda

## 2023-01-02 NOTE — ED Triage Notes (Signed)
Pt presents to ED POV. Pt c/o generalized abd pain that radiates to pelvis and back x1w. Pt also c/o nausea, dizziness, pain when urinating. Long hx of abd issues.

## 2023-01-05 ENCOUNTER — Ambulatory Visit: Payer: Medicaid Other | Admitting: Physical Therapy

## 2023-01-06 ENCOUNTER — Other Ambulatory Visit (HOSPITAL_BASED_OUTPATIENT_CLINIC_OR_DEPARTMENT_OTHER): Payer: Self-pay

## 2023-01-06 ENCOUNTER — Emergency Department (HOSPITAL_BASED_OUTPATIENT_CLINIC_OR_DEPARTMENT_OTHER): Payer: Medicaid Other

## 2023-01-06 ENCOUNTER — Emergency Department (HOSPITAL_BASED_OUTPATIENT_CLINIC_OR_DEPARTMENT_OTHER)
Admission: EM | Admit: 2023-01-06 | Discharge: 2023-01-06 | Disposition: A | Discharge status: Home / Self Care | Payer: Medicaid Other | Attending: Emergency Medicine | Admitting: Emergency Medicine

## 2023-01-06 ENCOUNTER — Encounter (HOSPITAL_BASED_OUTPATIENT_CLINIC_OR_DEPARTMENT_OTHER): Payer: Self-pay

## 2023-01-06 ENCOUNTER — Other Ambulatory Visit: Payer: Self-pay

## 2023-01-06 DIAGNOSIS — I10 Essential (primary) hypertension: Secondary | ICD-10-CM | POA: Insufficient documentation

## 2023-01-06 DIAGNOSIS — R1031 Right lower quadrant pain: Secondary | ICD-10-CM | POA: Insufficient documentation

## 2023-01-06 DIAGNOSIS — R1032 Left lower quadrant pain: Secondary | ICD-10-CM | POA: Diagnosis not present

## 2023-01-06 DIAGNOSIS — R1030 Lower abdominal pain, unspecified: Secondary | ICD-10-CM

## 2023-01-06 DIAGNOSIS — R519 Headache, unspecified: Secondary | ICD-10-CM | POA: Insufficient documentation

## 2023-01-06 LAB — URINALYSIS, ROUTINE W REFLEX MICROSCOPIC
Bilirubin Urine: NEGATIVE
Glucose, UA: NEGATIVE mg/dL
Hgb urine dipstick: NEGATIVE
Ketones, ur: NEGATIVE mg/dL
Leukocytes,Ua: NEGATIVE
Nitrite: NEGATIVE
Protein, ur: NEGATIVE mg/dL
Specific Gravity, Urine: 1.01 (ref 1.005–1.030)
pH: 6.5 (ref 5.0–8.0)

## 2023-01-06 LAB — CBC WITH DIFFERENTIAL/PLATELET
Abs Immature Granulocytes: 0.01 10*3/uL (ref 0.00–0.07)
Basophils Absolute: 0 10*3/uL (ref 0.0–0.1)
Basophils Relative: 0 %
Eosinophils Absolute: 0.1 10*3/uL (ref 0.0–0.5)
Eosinophils Relative: 2 %
HCT: 33.6 % — ABNORMAL LOW (ref 36.0–46.0)
Hemoglobin: 10.5 g/dL — ABNORMAL LOW (ref 12.0–15.0)
Immature Granulocytes: 0 %
Lymphocytes Relative: 51 %
Lymphs Abs: 2.8 10*3/uL (ref 0.7–4.0)
MCH: 25.2 pg — ABNORMAL LOW (ref 26.0–34.0)
MCHC: 31.3 g/dL (ref 30.0–36.0)
MCV: 80.6 fL (ref 80.0–100.0)
Monocytes Absolute: 0.5 10*3/uL (ref 0.1–1.0)
Monocytes Relative: 8 %
Neutro Abs: 2.1 10*3/uL (ref 1.7–7.7)
Neutrophils Relative %: 39 %
Platelets: 245 10*3/uL (ref 150–400)
RBC: 4.17 MIL/uL (ref 3.87–5.11)
RDW: 14.1 % (ref 11.5–15.5)
WBC: 5.5 10*3/uL (ref 4.0–10.5)
nRBC: 0 % (ref 0.0–0.2)

## 2023-01-06 LAB — COMPREHENSIVE METABOLIC PANEL
ALT: 17 U/L (ref 0–44)
AST: 14 U/L — ABNORMAL LOW (ref 15–41)
Albumin: 4.1 g/dL (ref 3.5–5.0)
Alkaline Phosphatase: 74 U/L (ref 38–126)
Anion gap: 8 (ref 5–15)
BUN: 8 mg/dL (ref 6–20)
CO2: 24 mmol/L (ref 22–32)
Calcium: 9.2 mg/dL (ref 8.9–10.3)
Chloride: 105 mmol/L (ref 98–111)
Creatinine, Ser: 0.67 mg/dL (ref 0.44–1.00)
GFR, Estimated: >60 mL/min (ref >60)
Glucose, Bld: 85 mg/dL (ref 70–99)
Potassium: 3.8 mmol/L (ref 3.5–5.1)
Sodium: 137 mmol/L (ref 135–145)
Total Bilirubin: 0.3 mg/dL (ref 0.3–1.2)
Total Protein: 7.7 g/dL (ref 6.5–8.1)

## 2023-01-06 LAB — HCG, QUANTITATIVE, PREGNANCY: hCG, Beta Chain, Quant, S: <1 m[IU]/mL (ref <5)

## 2023-01-06 LAB — LIPASE, BLOOD: Lipase: <10 U/L — ABNORMAL LOW (ref 11–51)

## 2023-01-06 MED ORDER — ONDANSETRON HCL 4 MG/2ML IJ SOLN
4.0000 mg | Freq: Once | INTRAMUSCULAR | Status: AC
  Administered 2023-01-06: 4 mg via INTRAVENOUS
  Filled 2023-01-06: qty 2

## 2023-01-06 MED ORDER — KETOROLAC TROMETHAMINE 15 MG/ML IJ SOLN
15.0000 mg | Freq: Once | INTRAMUSCULAR | Status: AC
  Administered 2023-01-06: 15 mg via INTRAVENOUS
  Filled 2023-01-06: qty 1

## 2023-01-06 MED ORDER — DIPHENHYDRAMINE HCL 25 MG PO CAPS
25.0000 mg | ORAL_CAPSULE | Freq: Once | ORAL | Status: AC
  Administered 2023-01-06: 25 mg via ORAL
  Filled 2023-01-06: qty 1

## 2023-01-06 MED ORDER — SODIUM CHLORIDE 0.9 % IV BOLUS
1000.0000 mL | Freq: Once | INTRAVENOUS | Status: AC
  Administered 2023-01-06: 1000 mL via INTRAVENOUS

## 2023-01-06 MED ORDER — OXYCODONE-ACETAMINOPHEN 5-325 MG PO TABS
1.0000 | ORAL_TABLET | Freq: Once | ORAL | Status: AC
  Administered 2023-01-06: 1 via ORAL
  Filled 2023-01-06: qty 1

## 2023-01-06 NOTE — ED Triage Notes (Signed)
In for eval of constant headache worse today and constant lower abd pain worsened yesterday after her fall. C/O-nausea, photosensitivity, dizziness. She suffers constantly for headaches. Was seen at [facility] ortho for bilat knee pain and ankle pain, has boot on right lower leg. Reports difficulty opening left eye.

## 2023-01-06 NOTE — ED Provider Notes (Signed)
[place] EMERGENCY DEPARTMENT AT [facility] Provider Note   CSN: [id number] Arrival date & time: 01/06/23  0935     History  Chief Complaint  Patient presents with   Headache   Abdominal Pain    Jacqueline Gonzalez is a 22 y.o. female with a past medical history of IBS, migraines, hypertension, who presents emergency department with concerns for constant headache worsening today. She has a history of migraines that she takes medications for.  She notes that she had a mechanical fall yesterday where she tripped over a rug.  Patient was evaluated at Ortho clinic due to her bilateral knee pain ankle pain.  The Ortho group completed x-ray imaging and placed a boot on her right lower leg.  She is to follow-up with them in 10 days.  Notes that she typically takes migraine medications.  With her headaches she typically has dizziness, lightheadedness, nausea.  Has associated photosensitivity.  Also noting lower abdominal pain that has been constant since she was evaluated in the ED several days ago for similar concerns.  She has not reached out to her GI or OB/GYN specialist regarding this.  Denies numbness, tingling.    Per patient chart review: Patient was evaluated emergency department on 01/02/2023 for similar symptoms.  Patient had negative lab and imaging studies.  Patient was encouraged to maintain follow-up with her OB/GYN and GI specialist regarding her ED visit.   The history is provided by the patient. No language interpreter was used.       Home Medications Prior to Admission medications   Medication Sig Start Date End Date Taking? Authorizing Provider  EMGALITY 120 MG/ML SOAJ Inject into the skin. 01/02/23  Yes [provider]  ADVAIR DISKUS 100-50 MCG/ACT AEPB Inhale 1 puff into the lungs 2 (two) times daily. 11/21/22   [provider]  cetirizine (ZYRTEC) 10 MG tablet Take 1 tablet (10 mg total) by mouth daily. 10/18/22   Redwine, Madison A, PA-C   dicyclomine (BENTYL) 20 MG tablet Take 1 tablet (20 mg total) by mouth 2 (two) times daily for 7 days. 01/02/23 01/09/23  Sloan Leiter, DO  DTx App - Gastrointestinal [facility] IBS) MISC Use as directed 11/25/22   Jenel Lucks, MD  FLUoxetine (PROZAC) 40 MG capsule Take 1 capsule (40 mg total) by mouth daily. Patient taking differently: Take 40 mg by mouth every morning. 12/31/21   Cora Collum, DO  fluticasone (FLONASE) 50 MCG/ACT nasal spray Place 2 sprays into both nostrils daily. 06/27/22   Claiborne Rigg, NP  Galcanezumab-gnlm (EMGALITY [place]) Inject 1 Dose into the skin every 30 (thirty) days.    [provider]  ibuprofen (ADVIL) 800 MG tablet Take 1 tablet (800 mg total) by mouth 3 (three) times daily. 06/04/22   Smitty Knudsen, PA-C  medroxyPROGESTERone (PROVERA) 10 MG tablet 1 tab po qday x 14 days each month. You can expect a period to start after the finishing the 14 days. 08/18/22   [place] Bing, MD  oxyCODONE (ROXICODONE) 5 MG immediate release tablet Take 1 tablet (5 mg total) by mouth every 4 (four) hours as needed for severe pain. 01/02/23   Sloan Leiter, DO  pantoprazole (PROTONIX) 40 MG tablet Take 1 tablet (40 mg total) by mouth 2 (two) times daily before a meal. 09/24/22   Lemmon, Violet Baldy, PA  Psyllium (METAMUCIL PO) Take 1 packet by mouth daily as needed (constipation).    [provider]  Allergies    Esthela Brandner dyes (parenteral), Blueberry [vaccinium angustifolium], Raspberry, Shellfish allergy, and Amitriptyline    Review of Systems   Review of Systems  Gastrointestinal:  Positive for abdominal pain.  Neurological:  Positive for headaches.  All other systems reviewed and are negative.   Physical Exam Updated Vital Signs BP 127/74   Pulse 73   Temp 97.8 F (36.6 C) (Oral)   Resp 16   Ht 5\' 8"  (1.727 m)   Wt 114.3 kg   SpO2 96%   BMI 38.32 kg/m  Physical Exam Vitals and nursing note reviewed.  Constitutional:      General:  She is not in acute distress.    Appearance: She is not diaphoretic.  HENT:     Head: Normocephalic and atraumatic.     Mouth/Throat:     Pharynx: No oropharyngeal exudate.  Eyes:     General: No scleral icterus.    Extraocular Movements: Extraocular movements intact.     Conjunctiva/sclera: Conjunctivae normal.     Pupils: Pupils are equal, round, and reactive to light.  Cardiovascular:     Rate and Rhythm: Normal rate and regular rhythm.     Pulses: Normal pulses.     Heart sounds: Normal heart sounds.  Pulmonary:     Effort: Pulmonary effort is normal. No respiratory distress.     Breath sounds: Normal breath sounds. No wheezing.  Abdominal:     General: Bowel sounds are normal.     Palpations: Abdomen is soft. There is no mass.     Tenderness: There is abdominal tenderness in the right lower quadrant, suprapubic area and left lower quadrant. There is no guarding or rebound.     Comments: Lower abdominal TTP.   Musculoskeletal:        General: Normal range of motion.     Cervical back: Normal range of motion and neck supple.     Comments: No spinal TTP. No TTP noted to musculature of back.  Skin:    General: Skin is warm and dry.  Neurological:     Mental Status: She is alert.     Cranial Nerves: Cranial nerves 2-12 are intact.     Sensory: Sensation is intact.     Motor: Motor function is intact.     Comments: No focal neurological deficits. Negative pronator drift. Able to ambulate without assistance or difficulty. Strength and sensation intact to BUE and BLE. Grip strength 5/5 bilaterally.    Psychiatric:        Behavior: Behavior normal.     ED Results / Procedures / Treatments   Labs (all labs ordered are listed, but only abnormal results are displayed) Labs Reviewed  CBC WITH DIFFERENTIAL/PLATELET - Abnormal; Notable for the following components:      Result Value   Hemoglobin 10.5 (*)    HCT 33.6 (*)    MCH 25.2 (*)    All other components within normal  limits  COMPREHENSIVE METABOLIC PANEL - Abnormal; Notable for the following components:   AST 14 (*)    All other components within normal limits  LIPASE, BLOOD - Abnormal; Notable for the following components:   Lipase <10 (*)    All other components within normal limits  URINALYSIS, ROUTINE W REFLEX MICROSCOPIC - Abnormal; Notable for the following components:   Color, Urine COLORLESS (*)    All other components within normal limits  HCG, QUANTITATIVE, PREGNANCY    EKG None  Radiology CT Head Wo Contrast  Result  Date: 01/06/2023 CLINICAL DATA:  Headache, increasing frequency or severity EXAM: CT HEAD WITHOUT CONTRAST TECHNIQUE: Contiguous axial images were obtained from the base of the skull through the vertex without intravenous contrast. RADIATION DOSE REDUCTION: This exam was performed according to the departmental dose-optimization program which includes automated exposure control, adjustment of the mA and/or kV according to patient size and/or use of iterative reconstruction technique. COMPARISON:  None Available. FINDINGS: Brain: No evidence of acute infarction, hemorrhage, hydrocephalus, extra-axial collection or mass lesion/mass effect. Vascular: No hyperdense vessel or unexpected calcification. Skull: Normal. Negative for fracture or focal lesion. Sinuses/Orbits: No middle ear or mastoid effusion. Paranasal sinuses are notable for mild polypoid mucosal thickening in the right maxillary sinus. Orbits are unremarkable. Other: None. IMPRESSION: No acute intracranial abnormality. No specific etiology for headaches identified. Electronically Signed   By: Lorenza Cambridge M.D.   On: 01/06/2023 11:12    Procedures Procedures    Medications Ordered in ED Medications  sodium chloride 0.9 % bolus 1,000 mL (0 mLs Intravenous Stopped 01/06/23 1234)  ondansetron (ZOFRAN) injection 4 mg (4 mg Intravenous Given 01/06/23 1049)  oxyCODONE-acetaminophen (PERCOCET/ROXICET) 5-325 MG per tablet 1  tablet (1 tablet Oral Given 01/06/23 1049)  ketorolac (TORADOL) 15 MG/ML injection 15 mg (15 mg Intravenous Given 01/06/23 1316)  diphenhydrAMINE (BENADRYL) capsule 25 mg (25 mg Oral Given 01/06/23 1316)  oxyCODONE-acetaminophen (PERCOCET/ROXICET) 5-325 MG per tablet 1 tablet (1 tablet Oral Given 01/06/23 1420)    ED Course/ Medical Decision Making/ A&P Clinical Course as of 01/06/23 1851  Wed Jan 06, 2023  1147 Pt re-evaluated and resting comfortably on stretcher. Discussed with patient lab and imaging findings.  Patient notes that her symptoms are persisting.  Discussed with patient that we will give her additional dose of medication here in the ED.  Discussed with patient treatment plan. Answered all available questions.  [SB]  1405 Pt re-evaluated and noted mild improvement of symptoms with treatment regimen. Discussed with patient importantance of following up her care team regarding todays ED visit.  [SB]    Clinical Course User Index [SB] Lorain Keast A, PA-C                             Medical Decision Making Amount and/or Complexity of Data Reviewed Labs: ordered. Radiology: ordered.  Risk Prescription drug management.   Pt presents with concerns for constant headache and continued lower abdominal pain.  Has a history of migraines.  Also notes photosensitivity.  Patient afebrile.  On exam patient with lower abdominal tenderness to palpation.  No focal neurological deficits noted on exam.  Negative pronator drift.  No acute cardiovascular, respiratory, down exam findings.  Differential diagnosis includes CVA, TIA, migraine.   Co morbidities that complicate the patient evaluation: IBS, migraines, hypertension  Additional history obtained:  External records from outside source obtained and reviewed including: Patient was evaluated emergency department on 01/02/2023 for similar symptoms.  Patient had negative lab and imaging studies.  Patient was encouraged to maintain follow-up  with her OB/GYN and GI specialist regarding her ED visit.  Labs:  I ordered, and personally interpreted labs.  The pertinent results include:   Lipase unremarkable CBC with hemoglobin stable at 10.5 otherwise unremarkable CMP unremarkable Urinalysis unremarkable Negative hCG  Imaging: I ordered imaging studies including CT head I independently visualized and interpreted imaging which showed: No acute findings I agree with the radiologist interpretation  Medications:  I ordered medication including Percocet,  Toradol, Benadryl, Zofran, IV fluids for symptom management Reevaluation of the patient after these medicines and interventions, I reevaluated the patient and found that they have mildly improved I have reviewed the patients home medicines and have made adjustments as needed  Disposition: Presentation suspicious for bad headache, migraine exacerbation.  Also notable for constant chronic lower abdominal pain.  No acute lab changes from ED visit on 01/02/2023.  Patient with lower abdominal pain and same area.  No intracranial findings noted on CT scan.  Doubt concerns at this time for CVA or TIA.  After consideration of the diagnostic results and the patients response to treatment, I feel that the patient would benefit from Discharge home.  Patient instructed to follow-up with her care team regarding today's ED visit.  Supportive care measures and strict return precautions discussed with patient at bedside. Pt acknowledges and verbalizes understanding. Pt appears safe for discharge. Follow up as indicated in discharge paperwork.    This chart was dictated using voice recognition software, Dragon. Despite the best efforts of this provider to proofread and correct errors, errors may still occur which can change documentation meaning.   Final Clinical Impression(s) / ED Diagnoses Final diagnoses:  Bad headache  Lower abdominal pain    Rx / DC Orders ED Discharge Orders     None          Lona Six A, PA-C 01/06/23 1851    Rexford Maus, DO 01/07/23 [phone]

## 2023-01-06 NOTE — Discharge Instructions (Addendum)
It was a pleasure taking care of you today!  Your lab and imaging studies did not show any concerning emergent findings at this time.  You may call your primary care provider, GI specialist, OB/GYN specialist, neurologist, headache wellness clinic provider today to set up a follow-up appointment regarding today's ED visit.  Ensure to maintain fluid intake. Return to the emergency department if you experience increasing/worsening symptoms.

## 2023-01-07 ENCOUNTER — Ambulatory Visit: Payer: Medicaid Other

## 2023-01-07 DIAGNOSIS — R002 Palpitations: Secondary | ICD-10-CM

## 2023-01-07 DIAGNOSIS — R072 Precordial pain: Secondary | ICD-10-CM

## 2023-01-07 DIAGNOSIS — R0609 Other forms of dyspnea: Secondary | ICD-10-CM

## 2023-01-08 ENCOUNTER — Encounter: Payer: Self-pay | Admitting: Internal Medicine

## 2023-01-08 ENCOUNTER — Ambulatory Visit (HOSPITAL_BASED_OUTPATIENT_CLINIC_OR_DEPARTMENT_OTHER): Payer: Medicaid Other | Attending: Internal Medicine | Admitting: Internal Medicine

## 2023-01-08 VITALS — BP 121/81 | HR 87 | Resp 14 | Ht 67.0 in | Wt 256.0 lb

## 2023-01-08 DIAGNOSIS — R700 Elevated erythrocyte sedimentation rate: Secondary | ICD-10-CM

## 2023-01-08 DIAGNOSIS — R768 Other specified abnormal immunological findings in serum: Secondary | ICD-10-CM

## 2023-01-08 DIAGNOSIS — K582 Mixed irritable bowel syndrome: Secondary | ICD-10-CM

## 2023-01-08 DIAGNOSIS — G44209 Tension-type headache, unspecified, not intractable: Secondary | ICD-10-CM | POA: Diagnosis not present

## 2023-01-08 DIAGNOSIS — M2550 Pain in unspecified joint: Secondary | ICD-10-CM | POA: Insufficient documentation

## 2023-01-08 DIAGNOSIS — E559 Vitamin D deficiency, unspecified: Secondary | ICD-10-CM

## 2023-01-08 DIAGNOSIS — K589 Irritable bowel syndrome without diarrhea: Secondary | ICD-10-CM | POA: Insufficient documentation

## 2023-01-08 NOTE — Progress Notes (Signed)
Office Visit Note  Patient: Jacqueline Gonzalez             Date of Birth: 03-06-2001           MRN: [id number]             PCP: Levonne Lapping, NP Referring: Levonne Lapping, NP Visit Date: 01/08/2023   Subjective:  New Patient (Initial Visit) (Patient states she has bad body pains. Patient states she has pain in her arms legs, pelvis, stomach, and head and it lasts all day. )   History of Present Illness: Jacqueline Gonzalez is a 22 y.o. female here for evaluation of positive ANA associated with joint pains and elevated sedimentation rate.  She reports longstanding history of joint pain in multiple areas going all the way back to adolescence.  However her current problems with persistent and sometimes activity limiting widespread pain has been more prominent in the past 1 year.  This is also associated with worsening chronic fatigue.  She did not recall any preceding serious infection.  She mostly describes this as being all over the but sometimes in particular has had more back and chronic pelvic pain.  Has had low back pain and radicular symptoms extending throughout the left leg up to include part of the perineum.  Also reports previous diagnosis with knee osteoarthritis.  Pain is varying in severity worse on some days than others not associated with much visible swelling or discoloration in affected areas.  Also sensitive to pressure or temperature changes in affected areas.  Previous treatments tried include gabapentin, oxycodone, and high-dose ibuprofen but did not find any of them highly effective. Lab testing looking into these increase symptoms in February showing positive ANA, sed rate of 40, and deficient vitamin D at 4.6.  She was also started on 50,000 units weekly supplementation for vitamin D but has not felt a noticeable difference. She has history of eczema has been treated with what sounds like topical emollients in dermatology office but does not currently follow-up.  Describes her skin  breaking out and irritation if having prolonged sun exposure.  No specific lesions persisting mostly just the irritation.  She reports several episodes with cystic lesions in the skin under her armpits under the breasts and in the groin.  Sometimes with drainage.  No previous diagnosis of hidradenitis suppurativa. She intermittently gets thrush possibly associated with her inhaler treatment but she does not use this consistently.  Does not get frequent oral nasal ulcers.  She denies typical Raynaud's symptoms, lymphadenopathy, or history of blood clots. Is also had more trouble with migraine headaches during this year was started on injection treatments for the past 3 months.  Also has persistent irritable bowel symptoms mixed constipation and diarrhea on Bentyl with some benefit.  She experiences some kind of dizziness or vertiginous symptoms these come and go without a specific position or provocation.  Says sometimes last up to 1 hour at a time.  Family history includes an aunt with Graves' disease but no first-degree relative known autoimmune disease.  Labs reviewed 11/2022 TSH wnl  08/2022 ANA pos ESR 40 Vit D 4.6 RF neg CCP neg   Activities of Daily Living:  Patient reports morning stiffness for 0 minute.   Patient Reports nocturnal pain.  Difficulty dressing/grooming: Reports Difficulty climbing stairs: Reports Difficulty getting out of chair: Denies Difficulty using hands for taps, buttons, cutlery, and/or writing: Reports  Review of Systems  Constitutional:  Positive for fatigue.  HENT:  Positive for  mouth dryness. Negative for mouth sores.   Eyes:  Negative for dryness.  Respiratory:  Positive for shortness of breath.   Cardiovascular:  Positive for chest pain and palpitations.  Gastrointestinal:  Positive for blood in stool, constipation and diarrhea.  Endocrine: Negative for increased urination.  Genitourinary:  Negative for involuntary urination.  Musculoskeletal:   Positive for joint pain, gait problem, joint pain, myalgias, muscle weakness, muscle tenderness and myalgias. Negative for joint swelling and morning stiffness.  Skin:  Positive for sensitivity to sunlight. Negative for color change, rash and hair loss.  Allergic/Immunologic: Negative for susceptible to infections.  Neurological:  Positive for dizziness and headaches.  Hematological:  Negative for swollen glands.  Psychiatric/Behavioral:  Positive for depressed mood and sleep disturbance. The patient is nervous/anxious.     PMFS History:  Patient Active Problem List   Diagnosis Date Noted   Positive ANA (antinuclear antibody) 01/08/2023   IBS (irritable bowel syndrome) 01/08/2023   Sedimentation rate elevation 01/08/2023   Polyarthralgia 01/08/2023   Vitamin D deficiency 01/08/2023   PCOS (polycystic ovarian syndrome) 08/26/2022   LGSIL of cervix of undetermined significance 08/26/2022   History of PID 08/18/2022   Rectal abscess supralevator s/p I&D 03/14/2022 03/14/2022   Rectal pain    UTI (urinary tract infection) 03/10/2022   Rectal bleeding    Anal fissure    Abnormal CT scan, colon    Proctocolitis 03/09/2022   Menorrhagia with irregular cycle 11/18/2021   Dysmenorrhea 11/18/2021   Chronic anemia 11/17/2021   Lower abdominal pain 11/07/2021   Moderate episode of recurrent major depressive disorder (HCC) 06/23/2021   Low back pain 05/02/2021   Headache 09/03/2020   Nonspecific abdominal pain 08/30/2020   Ganglion cyst of dorsum of left wrist 11/03/2019   Constipation, chronic 01/21/2018   Irregular periods 01/21/2018   Generalized anxiety disorder 01/21/2018    Past Medical History:  Diagnosis Date   Anemia    Anxiety    Arthritis    knees   Chlamydia 11/13/2021   Constipation, chronic    Depression    Environmental allergies    Ganglion cyst of dorsum of left wrist    RECURRENT   GERD (gastroesophageal reflux disease)    watches diet   Headache     Hypertension    IBS (irritable bowel syndrome)    Lactose intolerance     Family History  Problem Relation Age of Onset   Heart failure Mother    Migraines Mother    Irritable bowel syndrome Mother    Other Mother 20       Tricuspid valve regurgitation   Asthma Father    Hypertension Maternal Grandmother    Diabetes Maternal Grandmother    Hypertension Maternal Grandfather    Diabetes Maternal Grandfather    Hypertension Paternal Grandmother    Stroke Paternal Grandmother    Diabetes Paternal Grandmother    Hypertension Paternal Grandfather    Diabetes Paternal Grandfather    Kidney disease Paternal Grandfather    Ovarian cancer Maternal Aunt    Graves' disease Paternal Aunt    Stomach cancer Neg Hx    Esophageal cancer Neg Hx    Colon cancer Neg Hx    Past Surgical History:  Procedure Laterality Date   GANGLION CYST EXCISION Left 04/24/2021   Procedure: left recurrent dorsal carpal ganglion excision;  Surgeon: Gomez Cleverly, MD;  Location: [facility] [place];  Service: Orthopedics;  Laterality: Left;   INCISION AND DRAINAGE PERIRECTAL ABSCESS N/A 03/14/2022  Procedure: IRRIGATION AND DEBRIDEMENT PERIRECTAL ABSCESS;  Surgeon: Karie Soda, MD;  Location: [facility];  Service: General;  Laterality: N/A;   TONSILLECTOMY AND ADENOIDECTOMY     AGE 59   WISDOM TOOTH EXTRACTION  03/18/2021   WRIST GANGLION EXCISION Left 02/2020   Social History   Social History Narrative   Not on file   Immunization History  Administered Date(s) Administered   HPV 9-valent 02/19/2012, 12/19/2012, 05/01/2013   Influenza,inj,Quad PF,6+ Mos 06/15/2018   Meningococcal Mcv4o 11/21/2018   Moderna Sars-Covid-2 Vaccination 11/22/2019, 12/20/2019     Objective: Vital Signs: BP 121/81 (BP Location: Right Arm, Patient Position: Sitting, Cuff Size: Normal)   Pulse 87   Resp 14   Ht 5\' 7"  (1.702 m)   Wt 256 lb (116.1 kg)   LMP  (LMP Unknown) Comment: March was last period  BMI 40.10 kg/m     Physical Exam Constitutional:      Appearance: She is obese.  HENT:     Mouth/Throat:     Mouth: Mucous membranes are moist.     Pharynx: Oropharynx is clear.  Eyes:     Conjunctiva/sclera: Conjunctivae normal.  Cardiovascular:     Rate and Rhythm: Normal rate and regular rhythm.  Pulmonary:     Effort: Pulmonary effort is normal.     Breath sounds: Normal breath sounds.  Musculoskeletal:     Right lower leg: No edema.     Left lower leg: No edema.  Lymphadenopathy:     Cervical: No cervical adenopathy.  Skin:    General: Skin is warm and dry.     Findings: Rash present.     Comments: Dry, hyperpigmented skin on elbows, mild excoriations on forearm extensor surfaces Scaly skin on distal legs without discoloration b/l  Neurological:     Mental Status: She is alert.  Psychiatric:        Mood and Affect: Mood normal.      Musculoskeletal Exam:  Shoulders full ROM no tenderness or swelling Elbows full ROM no tenderness or swelling Wrists full ROM no tenderness or swelling Fingers full ROM no tenderness or swelling Knees full ROM no tenderness or swelling Left ankle in boot   Investigation: No additional findings.  Imaging: CT Head Wo Contrast  Result Date: 01/06/2023 CLINICAL DATA:  Headache, increasing frequency or severity EXAM: CT HEAD WITHOUT CONTRAST TECHNIQUE: Contiguous axial images were obtained from the base of the skull through the vertex without intravenous contrast. RADIATION DOSE REDUCTION: This exam was performed according to the departmental dose-optimization program which includes automated exposure control, adjustment of the mA and/or kV according to patient size and/or use of iterative reconstruction technique. COMPARISON:  None Available. FINDINGS: Brain: No evidence of acute infarction, hemorrhage, hydrocephalus, extra-axial collection or mass lesion/mass effect. Vascular: No hyperdense vessel or unexpected calcification. Skull: Normal. Negative for  fracture or focal lesion. Sinuses/Orbits: No middle ear or mastoid effusion. Paranasal sinuses are notable for mild polypoid mucosal thickening in the right maxillary sinus. Orbits are unremarkable. Other: None. IMPRESSION: No acute intracranial abnormality. No specific etiology for headaches identified. Electronically Signed   By: Lorenza Cambridge M.D.   On: 01/06/2023 11:12   PCV ECHOCARDIOGRAM COMPLETE  Result Date: 01/02/2023 Echocardiogram 12/31/2022: Normal LV systolic function with visual EF 60-65%. Left ventricle cavity is normal in size. Normal left ventricular wall thickness. Normal global wall motion. Normal diastolic filling pattern, normal LAP. No significant valvular heart disease. No prior study for comparison.   CT ABDOMEN PELVIS W  CONTRAST  Result Date: 01/02/2023 CLINICAL DATA:  Acute abdominal pain EXAM: CT ABDOMEN AND PELVIS WITH CONTRAST TECHNIQUE: Multidetector CT imaging of the abdomen and pelvis was performed using the standard protocol following bolus administration of intravenous contrast. RADIATION DOSE REDUCTION: This exam was performed according to the departmental dose-optimization program which includes automated exposure control, adjustment of the mA and/or kV according to patient size and/or use of iterative reconstruction technique. CONTRAST:  OMNIPAQUE IOHEXOL 300 MG/ML  SOLN COMPARISON:  04/16/2022 FINDINGS: Lower chest: No acute abnormality. Hepatobiliary: No focal liver abnormality is seen. No gallstones, gallbladder wall thickening, or biliary dilatation. Pancreas: Unremarkable. No pancreatic ductal dilatation or surrounding inflammatory changes. Spleen: Normal in size without focal abnormality. Adrenals/Urinary Tract: Adrenal glands are within normal limits. Kidneys demonstrate a normal enhancement pattern bilaterally. No renal calculi or obstructive changes are noted. The bladder is partially distended. Stomach/Bowel: Appendix is within normal limits. No  obstructive or inflammatory changes of the colon are seen. Small bowel and stomach are within normal limits. Vascular/Lymphatic: No significant vascular findings are present. No enlarged abdominal or pelvic lymph nodes. Reproductive: Uterus and bilateral adnexa are unremarkable. Other: No abdominal wall hernia or abnormality. No abdominopelvic ascites. Musculoskeletal: No acute or significant osseous findings. IMPRESSION: No acute abnormality noted. Electronically Signed   By: Alcide Clever M.D.   On: 01/02/2023 02:38    Recent Labs: Lab Results  Component Value Date   WBC 5.5 01/06/2023   HGB 10.5 (L) 01/06/2023   PLT 245 01/06/2023   NA 137 01/06/2023   K 3.8 01/06/2023   CL 105 01/06/2023   CO2 24 01/06/2023   GLUCOSE 85 01/06/2023   BUN 8 01/06/2023   CREATININE 0.67 01/06/2023   BILITOT 0.3 01/06/2023   ALKPHOS 74 01/06/2023   AST 14 (L) 01/06/2023   ALT 17 01/06/2023   PROT 7.7 01/06/2023   ALBUMIN 4.1 01/06/2023   CALCIUM 9.2 01/06/2023   GFRAA 147 08/28/2020    Speciality Comments: No specialty comments available.  Procedures:  No procedures performed Allergies: Blue dyes (parenteral), Blueberry [vaccinium angustifolium], Raspberry, Shellfish allergy, and Amitriptyline   Assessment / Plan:     Visit Diagnoses: Positive ANA (antinuclear antibody) - Plan: Sedimentation rate, ANA, RNP Antibody, Anti-Smith antibody, Sjogrens syndrome-A extractable nuclear antibody, Sjogrens syndrome-B extractable nuclear antibody, Anti-DNA antibody, double-stranded, C3 and C4, Chromatin (Nucleosomal) Antibody  Multiple widespread and chronic symptoms but no specific clinical criteria from exam or history today.  Possibly the reported photosensitive skin rashes but not currently present to evaluate.  Will check antibody panel for this also checking ANA titer and pattern and rechecking the previously elevated sedimentation rate.  Overall lower suspicion that this represents a problem like lupus.    Polyarthralgia Acute non intractable tension-type headache Irritable bowel syndrome with both constipation and diarrhea  Constellation of symptoms with diffuse body pains varying in severity with little appreciable exam finding sounds very suggestive for fibromyalgia syndrome.  Especially with endorsing widespread body pains dating back well into teenage years.  Migraine headaches, fatigue, irritable bowel, nonspecific dizziness are all frequent associated symptoms.  If inflammatory workup is unremarkable may benefit with more symptomatic management approach in this direction.  Sedimentation rate elevation  Would have some concern if sedimentation rate remains high despite nonspecific serology could be associated with what sounds like hidradenitis suppurativa symptoms as well.  Vitamin D deficiency - Plan: VITAMIN D 25 Hydroxy (Vit-D Deficiency, Fractures)  Will recheck the vitamin D level this was severely deficient  at 4.6 but reports adherence to the 50,000 units weekly supplement.  Particularly important as we discussed UV protection in the setting of photosensitive rashes and importance for maintaining adequate vitamin D for immune function and bone health.  Orders: Orders Placed This Encounter  Procedures   Sedimentation rate   ANA   RNP Antibody   Anti-Smith antibody   Sjogrens syndrome-A extractable nuclear antibody   Sjogrens syndrome-B extractable nuclear antibody   Anti-DNA antibody, double-stranded   C3 and C4   Chromatin (Nucleosomal) Antibody   VITAMIN D 25 Hydroxy (Vit-D Deficiency, Fractures)   No orders of the defined types were placed in this encounter.    Follow-Up Instructions: Return in about 3 weeks (around 01/29/2023) for New pt +ANA/?FMS f/u 3wks.   Fuller Plan, MD  Note - This record has been created using [employer].  Chart creation errors have been sought, but may not always  have been located. Such creation errors do not reflect on   the standard of medical care.

## 2023-01-09 LAB — CHROMATIN (NUCLEOSOMAL) ANTIBODY: Chromatin (Nucleosomal) Antibody: <1 AI

## 2023-01-09 LAB — ANA: Anti Nuclear Antibody (ANA): NEGATIVE

## 2023-01-09 LAB — SEDIMENTATION RATE: Sed Rate: 39 mm/h — ABNORMAL HIGH (ref 0–20)

## 2023-01-09 LAB — SJOGRENS SYNDROME-A EXTRACTABLE NUCLEAR ANTIBODY: SSA (Ro) (ENA) Antibody, IgG: <1 AI

## 2023-01-09 LAB — ANTI-SMITH ANTIBODY: ENA SM Ab Ser-aCnc: <1 AI

## 2023-01-09 LAB — RNP ANTIBODY: Ribonucleic Protein(ENA) Antibody, IgG: 2.9 AI — AB

## 2023-01-09 LAB — SJOGRENS SYNDROME-B EXTRACTABLE NUCLEAR ANTIBODY: SSB (La) (ENA) Antibody, IgG: <1 AI

## 2023-01-09 LAB — C3 AND C4
C3 Complement: 197 mg/dL — ABNORMAL HIGH (ref 83–193)
C4 Complement: 41 mg/dL (ref 15–57)

## 2023-01-09 LAB — VITAMIN D 25 HYDROXY (VIT D DEFICIENCY, FRACTURES): Vit D, 25-Hydroxy: 37 ng/mL (ref 30–100)

## 2023-01-09 LAB — ANTI-DNA ANTIBODY, DOUBLE-STRANDED: ds DNA Ab: <1 IU/mL

## 2023-01-12 ENCOUNTER — Ambulatory Visit: Payer: Medicaid Other | Admitting: Physical Therapy

## 2023-01-12 DIAGNOSIS — R279 Unspecified lack of coordination: Secondary | ICD-10-CM

## 2023-01-12 DIAGNOSIS — M6281 Muscle weakness (generalized): Secondary | ICD-10-CM

## 2023-01-12 DIAGNOSIS — M62838 Other muscle spasm: Secondary | ICD-10-CM
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
## 2023-01-19 ENCOUNTER — Other Ambulatory Visit: Payer: Self-pay

## 2023-01-19 ENCOUNTER — Encounter (HOSPITAL_BASED_OUTPATIENT_CLINIC_OR_DEPARTMENT_OTHER): Payer: Self-pay | Admitting: Obstetrics and Gynecology

## 2023-01-19 NOTE — Progress Notes (Addendum)
Spoke w/ via phone for pre-op interview---PT Lab needs dos----URINE PREG               Lab results------ECHO 12-31-2022 EPIC, STRESS TEST 01-07-2023 EPIC, EKG 12-10-2022 EPIC, CARDIOLOGY DR Shanon Ace LOV 12-10-2022 EPIC, CT HEAD 01-06-2023 EPIC COVID test -----patient states asymptomatic no test needed Arrive at -------1015 AM 02-10-2023 NPO after MN NO Solid Food.  Clear liquids from MN until---915 AM Med rec completed Medications to take morning of surgery -----PROZAC, PANTOPRAZOLE, ADVAIR, ALBUTEROL INHALER PRN/BRING INHALER Diabetic medication -----N/A Patient instructed no nail polish to be worn day of surgery Patient instructed to bring photo id and insurance card day of surgery Patient aware to have Driver (ride ) / caregiver    MOTHER LAKEYIA for 24 hours after surgery  Patient Special Instructions -----NONE Pre-Op special Instructions -----NONE Patient verbalized understanding of instructions that were given at this phone interview. Patient denies shortness of breath, chest pain, fever, cough at this phone interview.

## 2023-01-28 ENCOUNTER — Ambulatory Visit: Payer: Medicaid Other | Admitting: Cardiology

## 2023-01-28 ENCOUNTER — Encounter: Payer: Self-pay | Admitting: Cardiology

## 2023-01-28 VITALS — BP 104/68 | HR 85 | Resp 16 | Ht 68.0 in | Wt 258.4 lb

## 2023-01-28 DIAGNOSIS — R072 Precordial pain: Secondary | ICD-10-CM | POA: Insufficient documentation

## 2023-01-28 NOTE — Progress Notes (Signed)
Patient referred by Levonne Lapping, NP for palpitations  Subjective:   Jacqueline Gonzalez, female    DOB: December 25, 2000, 22 y.o.   MRN: [id number]   Chief Complaint  Patient presents with   Palpitations    HPI  22 y.o. African-American female with palpitations, exertional dyspnea and chest pain  Reviewed recent test results with the patient, details below.    Current Outpatient Medications:    ADVAIR DISKUS 100-50 MCG/ACT AEPB, Inhale 1 puff into the lungs 2 (two) times daily., Disp: , Rfl:    albuterol (VENTOLIN HFA) 108 (90 Base) MCG/ACT inhaler, Inhale into the lungs every 6 (six) hours as needed for wheezing or shortness of breath., Disp: , Rfl:    cetirizine (ZYRTEC) 10 MG tablet, Take 1 tablet (10 mg total) by mouth daily., Disp: 30 tablet, Rfl: 0   dicyclomine (BENTYL) 20 MG tablet, Take 1 tablet (20 mg total) by mouth 2 (two) times daily for 7 days. (Patient not taking: Reported on 01/19/2023), Disp: 14 tablet, Rfl: 0   DTx App - Gastrointestinal [facility] IBS) MISC, Use as directed (Patient not taking: Reported on 01/08/2023), Disp: 11 each, Rfl: 1   EMGALITY 120 MG/ML SOAJ, Inject into the skin., Disp: , Rfl:    FAMOTIDINE PO, Take by mouth daily., Disp: , Rfl:    FLUoxetine (PROZAC) 40 MG capsule, Take 1 capsule (40 mg total) by mouth daily. (Patient taking differently: Take 40 mg by mouth every morning.), Disp: 90 capsule, Rfl: 3   fluticasone (FLONASE) 50 MCG/ACT nasal spray, Place 2 sprays into both nostrils daily., Disp: 16 g, Rfl: 0   Galcanezumab-gnlm (EMGALITY [place]), Inject 1 Dose into the skin every 30 (thirty) days., Disp: , Rfl:    ibuprofen (ADVIL) 800 MG tablet, Take 1 tablet (800 mg total) by mouth 3 (three) times daily., Disp: 21 tablet, Rfl: 0   medroxyPROGESTERone (PROVERA) 10 MG tablet, 1 tab po qday x 14 days each month. You can expect a period to start after the finishing the 14 days., Disp: 42 tablet, Rfl: 3   oxyCODONE (ROXICODONE) 5 MG immediate release tablet,  Take 1 tablet (5 mg total) by mouth every 4 (four) hours as needed for severe pain., Disp: 10 tablet, Rfl: 0   pantoprazole (PROTONIX) 40 MG tablet, Take 1 tablet (40 mg total) by mouth 2 (two) times daily before a meal. (Patient taking differently: Take 40 mg by mouth as needed.), Disp: 60 tablet, Rfl: 6   Psyllium (METAMUCIL PO), Take 1 packet by mouth daily as needed (constipation)., Disp: , Rfl:    Vitamin D, Ergocalciferol, (DRISDOL) 1.25 MG (50000 UNIT) CAPS capsule, Take 50,000 Units by mouth every 7 (seven) days., Disp: , Rfl:    Cardiovascular and other pertinent studies:  Reviewed external labs and tests, independently interpreted  EKG 12/10/2022: Sinus rhythm 87 bpm  Early repolarization Nonspecific T-abnormality  Echocardiogram 12/31/2022: Normal LV systolic function with visual EF 60-65%. Left ventricle cavity is normal in size. Normal left ventricular wall thickness. Normal global wall motion. Normal diastolic filling pattern, normal LAP. No significant valvular heart disease. No prior study for comparison.  Exercise treadmill stress test 01/09/2023: Exercise treadmill stress test performed using Bruce protocol.  Patient exercised for a total of 4 minutes and 25 seconds, achieving 6.3 METS, and 90% of age predicted maximum heart rate.  Exercise capacity was low.  6/10, non-limiting chest pain reported.  Normal heart rate and hemodynamic response. Stress EKG revealed no ischemic changes to explain patient's  chest pain. Consider alternate causes for chest pain.    Recent labs: 11/19/2022: Glucose 118, BUN/Cr 6/0.71. EGFR 124. Na/K 140/4.2. Rest of the CMP normal H/H 10/35. MCV 84. Platelets 250 HbA1C NA% TSH 2.3 normal  04/20/2022: Glucose 101, BUN/Cr 9/0.71. EGFR >60. Na/K 137/3.6. Rest of the CMP normal H/H 10/32. MCV 80. Platelets 262  11/2021: TSH 6.3 high   Review of Systems  Cardiovascular:  Positive for chest pain and dyspnea on exertion. Negative for leg  swelling, palpitations and syncope.         Vitals:   01/28/23 1428  BP: 104/68  Pulse: 85  Resp: 16  SpO2: 98%      Body mass index is 39.29 kg/m. Filed Weights   01/28/23 1428  Weight: 258 lb 6.4 oz (117.2 kg)      Objective:   Physical Exam Vitals and nursing note reviewed.  Constitutional:      General: She is not in acute distress.    Appearance: She is obese.  Neck:     Vascular: No JVD.  Cardiovascular:     Rate and Rhythm: Normal rate and regular rhythm.     Heart sounds: Normal heart sounds. No murmur heard. Pulmonary:     Effort: Pulmonary effort is normal.     Breath sounds: Normal breath sounds. No wheezing or rales.  Musculoskeletal:     Right lower leg: No edema.     Left lower leg: No edema.          Visit diagnoses:   ICD-10-CM   1. Precordial pain  R07.2          Assessment & Recommendations:    22 y.o. African-American female with palpitations, exertional dyspnea and chest pain  Structurally normal heart. Chest pain on exercise treadmill stress test, but no EKG abnormality. No coronary calcification on prior CT chest. Consider noncardiac cause for chest pain.  F/u as needed.    Elder Negus, MD Pager: [phone] Office: [phone]

## 2023-02-09 ENCOUNTER — Encounter: Payer: Self-pay | Admitting: Internal Medicine

## 2023-02-09 ENCOUNTER — Other Ambulatory Visit: Payer: Self-pay | Admitting: Obstetrics and Gynecology

## 2023-02-09 ENCOUNTER — Ambulatory Visit: Payer: Medicaid Other | Attending: Internal Medicine | Admitting: Internal Medicine

## 2023-02-09 ENCOUNTER — Other Ambulatory Visit: Payer: Self-pay | Admitting: Family Medicine

## 2023-02-09 VITALS — BP 114/76 | HR 87 | Resp 14 | Ht 68.0 in | Wt 259.0 lb

## 2023-02-09 DIAGNOSIS — Z01818 Encounter for other preprocedural examination: Secondary | ICD-10-CM

## 2023-02-09 DIAGNOSIS — R700 Elevated erythrocyte sedimentation rate: Secondary | ICD-10-CM | POA: Insufficient documentation

## 2023-02-09 DIAGNOSIS — M1380 Other specified arthritis, unspecified site: Secondary | ICD-10-CM | POA: Insufficient documentation

## 2023-02-09 DIAGNOSIS — R768 Other specified abnormal immunological findings in serum: Secondary | ICD-10-CM | POA: Diagnosis not present

## 2023-02-09 MED ORDER — HYDROXYCHLOROQUINE SULFATE 200 MG PO TABS
200.0000 mg | ORAL_TABLET | Freq: Every day | ORAL | 2 refills | Status: DC
Start: 2023-02-09 — End: 2023-07-09

## 2023-02-09 NOTE — Patient Instructions (Signed)
Hydroxychloroquine Tablets What is this medication? HYDROXYCHLOROQUINE (hye drox ee KLOR oh kwin) treats autoimmune conditions, such as rheumatoid arthritis and lupus. It works by slowing down an overactive immune system. It may also be used to prevent and treat malaria. It works by killing the parasite that causes malaria. It belongs to a group of medications called DMARDs. This medicine may be used for other purposes; ask your health care provider or pharmacist if you have questions. COMMON BRAND NAME(S): Plaquenil, Quineprox, SOVUNA What should I tell my care team before I take this medication? They need to know if you have any of these conditions: Diabetes Eye disease, vision problems Frequently drink alcohol G6PD deficiency Heart disease Irregular heartbeat or rhythm Kidney disease Liver disease Porphyria Psoriasis An unusual or allergic reaction to hydroxychloroquine, other medications, foods, dyes, or preservatives Pregnant or trying to get pregnant Breastfeeding How should I use this medication? Take this medication by mouth with water. Take it as directed on the prescription label. Do not cut, crush, or chew this medication. Swallow the tablets whole. Take it with food. Do not take it more than directed. Take all of this medication unless your care team tells you to stop it early. Keep taking it even if you think you are better. Take products with antacids in them at a different time of day than this medication. Take this medication 4 hours before or 4 hours after antacids. Talk to your care team if you have questions. Talk to your care team about the use of this medication in children. While this medication may be prescribed for selected conditions, precautions do apply. Overdosage: If you think you have taken too much of this medicine contact a poison control center or emergency room at once. NOTE: This medicine is only for you. Do not share this medicine with others. What if I  miss a dose? If you miss a dose, take it as soon as you can. If it is almost time for your next dose, take only that dose. Do not take double or extra doses. What may interact with this medication? Do not take this medication with any of the following: Cisapride Dronedarone Pimozide Thioridazine This medication may also interact with the following: Ampicillin Antacids Cimetidine Cyclosporine Digoxin Kaolin Medications for diabetes, such as insulin, glipizide, glyburide Medications for seizures, such as carbamazepine, phenobarbital, phenytoin Mefloquine Methotrexate Other medications that cause heart rhythm changes Praziquantel This list may not describe all possible interactions. Give your health care provider a list of all the medicines, herbs, non-prescription drugs, or dietary supplements you use. Also tell them if you smoke, drink alcohol, or use illegal drugs. Some items may interact with your medicine. What should I watch for while using this medication? Visit your care team for regular checks on your progress. Tell your care team if your symptoms do not start to get better or if they get worse. You may need blood work done while you are taking this medication. If you take other medications that can affect heart rhythm, you may need more testing. Talk to your care team if you have questions. Your vision may be tested before and during use of this medication. Tell your care team right away if you have any change in your eyesight. This medication may cause serious skin reactions. They can happen weeks to months after starting the medication. Contact your care team right away if you notice fevers or flu-like symptoms with a rash. The rash may be red or purple and then  turn into blisters or peeling of the skin. Or, you might notice a red rash with swelling of the face, lips or lymph nodes in your neck or under your arms. If you or your family notice any changes in your behavior, such as  new or worsening depression, thoughts of harming yourself, anxiety, or other unusual or disturbing thoughts, or memory loss, call your care team right away. What side effects may I notice from receiving this medication? Side effects that you should report to your care team as soon as possible: Allergic reactions--skin rash, itching, hives, swelling of the face, lips, tongue, or throat Aplastic anemia--unusual weakness or fatigue, dizziness, headache, trouble breathing, increased bleeding or bruising Change in vision Heart rhythm changes--fast or irregular heartbeat, dizziness, feeling faint or lightheaded, chest pain, trouble breathing Infection--fever, chills, cough, or sore throat Low blood sugar (hypoglycemia)--tremors or shaking, anxiety, sweating, cold or clammy skin, confusion, dizziness, rapid heartbeat Muscle injury--unusual weakness or fatigue, muscle pain, dark yellow or brown urine, decrease in amount of urine Pain, tingling, or numbness in the hands or feet Rash, fever, and swollen lymph nodes Redness, blistering, peeling, or loosening of the skin, including inside the mouth Thoughts of suicide or self-harm, worsening mood, or feelings of depression Unusual bruising or bleeding Side effects that usually do not require medical attention (report to your care team if they continue or are bothersome): Diarrhea Headache Nausea Stomach pain Vomiting This list may not describe all possible side effects. Call your doctor for medical advice about side effects. You may report side effects to FDA at 1-800-FDA-1088. Where should I keep my medication? Keep out of the reach of children and pets. Store at room temperature up to 30 degrees C (86 degrees F). Protect from light. Get rid of any unused medication after the expiration date. To get rid of medications that are no longer needed or have expired: Take the medication to a medication take-back program. Check with your pharmacy or law  enforcement to find a location. If you cannot return the medication, check the label or package insert to see if the medication should be thrown out in the garbage or flushed down the toilet. If you are not sure, ask your care team. If it is safe to put it in the trash, empty the medication out of the container. Mix the medication with cat litter, dirt, coffee grounds, or other unwanted substance. Seal the mixture in a bag or container. Put it in the trash. NOTE: This sheet is a summary. It may not cover all possible information. If you have questions about this medicine, talk to your doctor, pharmacist, or health care provider.  2024 Elsevier/Gold Standard (2022-01-12 00:00:00)

## 2023-02-09 NOTE — Progress Notes (Signed)
Office Visit Note  Patient: Jacqueline Gonzalez             Date of Birth: 12/30/2000           MRN: [id number]             PCP: Levonne Lapping, NP Referring: Levonne Lapping, NP Visit Date: 02/09/2023   Subjective:  Follow-up (Patient states she is having swelling in her ankles and wrists. )   History of Present Illness: Jacqueline Gonzalez is a 22 y.o. female here for follow up for continued joint pain at multiple areas currently having trouble especially in her wrist and ankles.  Workup at our initial visit showed a high sedimentation rate high complement C3 and elevated RNP antibody titer so some concern for underlying inflammatory arthritis process.  She is noticing more symptoms around the ganglion cyst of the left wrist and pain especially with active movement.  Ankle pain and soreness especially with use but not noticing any specific nodules just generalized swelling.   Previous HPI 01/08/23 Jacqueline Gonzalez is a 22 y.o. female here for evaluation of positive ANA associated with joint pains and elevated sedimentation rate.  She reports longstanding history of joint pain in multiple areas going all the way back to adolescence.  However her current problems with persistent and sometimes activity limiting widespread pain has been more prominent in the past 1 year.  This is also associated with worsening chronic fatigue.  She did not recall any preceding serious infection.  She mostly describes this as being all over the but sometimes in particular has had more back and chronic pelvic pain.  Has had low back pain and radicular symptoms extending throughout the left leg up to include part of the perineum.  Also reports previous diagnosis with knee osteoarthritis.  Pain is varying in severity worse on some days than others not associated with much visible swelling or discoloration in affected areas.  Also sensitive to pressure or temperature changes in affected areas.  Previous treatments tried include  gabapentin, oxycodone, and high-dose ibuprofen but did not find any of them highly effective. Lab testing looking into these increase symptoms in February showing positive ANA, sed rate of 40, and deficient vitamin D at 4.6.  She was also started on 50,000 units weekly supplementation for vitamin D but has not felt a noticeable difference. She has history of eczema has been treated with what sounds like topical emollients in dermatology office but does not currently follow-up.  Describes her skin breaking out and irritation if having prolonged sun exposure.  No specific lesions persisting mostly just the irritation.  She reports several episodes with cystic lesions in the skin under her armpits under the breasts and in the groin.  Sometimes with drainage.  No previous diagnosis of hidradenitis suppurativa. She intermittently gets thrush possibly associated with her inhaler treatment but she does not use this consistently.  Does not get frequent oral nasal ulcers.  She denies typical Raynaud's symptoms, lymphadenopathy, or history of blood clots. Is also had more trouble with migraine headaches during this year was started on injection treatments for the past 3 months.  Also has persistent irritable bowel symptoms mixed constipation and diarrhea on Bentyl with some benefit.  She experiences some kind of dizziness or vertiginous symptoms these come and go without a specific position or provocation.  Says sometimes last up to 1 hour at a time.   Family history includes an aunt with Graves' disease but no first-degree relative known  autoimmune disease.   Labs reviewed 11/2022 TSH wnl   08/2022 ANA pos ESR 40 Vit D 4.6 RF neg CCP neg   Review of Systems  Constitutional:  Positive for fatigue.  HENT:  Positive for mouth dryness. Negative for mouth sores.   Eyes:  Negative for dryness.  Respiratory:  Positive for shortness of breath.   Cardiovascular:  Positive for chest pain and palpitations.   Gastrointestinal:  Positive for blood in stool, constipation and diarrhea.  Endocrine: Negative for increased urination.  Genitourinary:  Negative for involuntary urination.  Musculoskeletal:  Positive for joint pain, gait problem, joint pain, joint swelling, myalgias, muscle weakness, morning stiffness, muscle tenderness and myalgias.  Skin:  Positive for rash and sensitivity to sunlight. Negative for color change and hair loss.  Allergic/Immunologic: Negative for susceptible to infections.  Neurological:  Positive for dizziness and headaches.  Hematological:  Negative for swollen glands.  Psychiatric/Behavioral:  Positive for depressed mood and sleep disturbance. The patient is nervous/anxious.     PMFS History:  Patient Active Problem List   Diagnosis Date Noted   Seronegative inflammatory arthritis 02/09/2023   Precordial pain 01/28/2023   Positive ANA (antinuclear antibody) 01/08/2023   IBS (irritable bowel syndrome) 01/08/2023   Sedimentation rate elevation 01/08/2023   Polyarthralgia 01/08/2023   Vitamin D deficiency 01/08/2023   PCOS (polycystic ovarian syndrome) 08/26/2022   LGSIL of cervix of undetermined significance 08/26/2022   History of PID 08/18/2022   Rectal abscess supralevator s/p I&D 03/14/2022 03/14/2022   Rectal pain    UTI (urinary tract infection) 03/10/2022   Rectal bleeding    Anal fissure    Abnormal CT scan, colon    Proctocolitis 03/09/2022   Menorrhagia with irregular cycle 11/18/2021   Dysmenorrhea 11/18/2021   Chronic anemia 11/17/2021   Lower abdominal pain 11/07/2021   Moderate episode of recurrent major depressive disorder (HCC) 06/23/2021   Low back pain 05/02/2021   Headache 09/03/2020   Nonspecific abdominal pain 08/30/2020   Ganglion cyst of dorsum of left wrist 11/03/2019   Constipation, chronic 01/21/2018   Irregular periods 01/21/2018   Generalized anxiety disorder 01/21/2018    Past Medical History:  Diagnosis Date   Anemia     Anxiety    Arthritis    knees   Chlamydia 11/13/2021   Constipation, chronic    Depression    Dyspnea    ON OCC   Environmental allergies    Ganglion cyst of dorsum of left wrist    RECURRENT   GERD (gastroesophageal reflux disease)    watches diet   Headache MIGRAINE    History of hypertension    IN PAST, NO RECENT HTN PER PT 01-19-2023   IBS (irritable bowel syndrome)    Lactose intolerance    PCOS (polycystic ovarian syndrome)    Pre-diabetes     Family History  Problem Relation Age of Onset   Heart failure Mother    Migraines Mother    Irritable bowel syndrome Mother    Other Mother 51       Tricuspid valve regurgitation   Asthma Father    Hypertension Maternal Grandmother    Diabetes Maternal Grandmother    Hypertension Maternal Grandfather    Diabetes Maternal Grandfather    Hypertension Paternal Grandmother    Stroke Paternal Grandmother    Diabetes Paternal Grandmother    Hypertension Paternal Grandfather    Diabetes Paternal Grandfather    Kidney disease Paternal Grandfather    Ovarian cancer Maternal Aunt  Graves' disease Paternal Aunt    Stomach cancer Neg Hx    Esophageal cancer Neg Hx    Colon cancer Neg Hx    Past Surgical History:  Procedure Laterality Date   GANGLION CYST EXCISION Left 04/24/2021   Procedure: left recurrent dorsal carpal ganglion excision;  Surgeon: Gomez Cleverly, MD;  Location: [facility];  Service: Orthopedics;  Laterality: Left;   INCISION AND DRAINAGE PERIRECTAL ABSCESS N/A 03/14/2022   Procedure: IRRIGATION AND DEBRIDEMENT PERIRECTAL ABSCESS;  Surgeon: Karie Soda, MD;  Location: [facility];  Service: General;  Laterality: N/A;   LAPAROSCOPY N/A 02/10/2023   Procedure: LAPAROSCOPY DIAGNOSTIC;  Surgeon: Altamont Bing, MD;  Location: [facility];  Service: Gynecology;  Laterality: N/A;   TONSILLECTOMY AND ADENOIDECTOMY     AGE 59   WISDOM TOOTH EXTRACTION  03/18/2021   WRIST GANGLION EXCISION Left  02/2020   Social History   Social History Narrative   Not on file   Immunization History  Administered Date(s) Administered   HPV 9-valent 02/19/2012, 12/19/2012, 05/01/2013   Influenza,inj,Quad PF,6+ Mos 06/15/2018   Meningococcal Mcv4o 11/21/2018   Moderna Sars-Covid-2 Vaccination 11/22/2019, 12/20/2019     Objective: Vital Signs: BP 114/76 (BP Location: Left Arm, Patient Position: Sitting, Cuff Size: Normal)   Pulse 87   Resp 14   Ht 5\' 8"  (1.727 m)   Wt 259 lb (117.5 kg)   BMI 39.38 kg/m    Physical Exam Constitutional:      Appearance: She is obese.  Eyes:     Conjunctiva/sclera: Conjunctivae normal.  Cardiovascular:     Rate and Rhythm: Normal rate and regular rhythm.  Pulmonary:     Effort: Pulmonary effort is normal.     Breath sounds: Normal breath sounds.  Lymphadenopathy:     Cervical: No cervical adenopathy.  Skin:    General: Skin is warm and dry.     Comments: Faint excoriations on extremities, no erythematous rashes  Neurological:     Mental Status: She is alert.  Psychiatric:        Mood and Affect: Mood normal.      Musculoskeletal Exam:  Shoulders full ROM no tenderness or swelling Elbows full ROM no tenderness or swelling Left wrist ganglion cyst present, no other palpable swelling, pain provoked with flexion and extension Fingers full ROM no tenderness or swelling Knees full ROM no tenderness or swelling Ankles full ROM tenderness to pressure and with movement, no palpable effusion  Investigation: No additional findings.  Imaging: No results found.  Recent Labs: Lab Results  Component Value Date   WBC 5.5 01/06/2023   HGB 10.5 (L) 01/06/2023   PLT 245 01/06/2023   NA 137 01/06/2023   K 3.8 01/06/2023   CL 105 01/06/2023   CO2 24 01/06/2023   GLUCOSE 85 01/06/2023   BUN 8 01/06/2023   CREATININE 0.67 01/06/2023   BILITOT 0.3 01/06/2023   ALKPHOS 74 01/06/2023   AST 14 (L) 01/06/2023   ALT 17 01/06/2023   PROT 7.7  01/06/2023   ALBUMIN 4.1 01/06/2023   CALCIUM 9.2 01/06/2023   GFRAA 147 08/28/2020    Speciality Comments: No specialty comments available.  Procedures:  No procedures performed Allergies: Blue dyes (parenteral), Blueberry [vaccinium angustifolium], Raspberry, Shellfish allergy, Amitriptyline, and Sumatriptan   Assessment / Plan:     Visit Diagnoses: Positive ANA (antinuclear antibody) - Plan: hydroxychloroquine (PLAQUENIL) 200 MG tablet  Positive ANA with a mildly raised RNP antibody titer does not demonstrate  clinical criteria for systemic lupus or mixed connective tissue disease at this time.  However is concerning for some type of autoimmune process underlying inflammatory arthritis based on findings.  Recommend starting hydroxychloroquine 200 mg daily for suspected seronegative inflammatory arthritis.  Sedimentation rate elevation Seronegative inflammatory arthritis - Plan: hydroxychloroquine (PLAQUENIL) 200 MG tablet    Orders: No orders of the defined types were placed in this encounter.  Meds ordered this encounter  Medications   hydroxychloroquine (PLAQUENIL) 200 MG tablet    Sig: Take 1 tablet (200 mg total) by mouth daily.    Dispense:  60 tablet    Refill:  2     Follow-Up Instructions: Return in about 2 months (around 04/12/2023) for ?UCTD HCQ start f/u 2mos.   Fuller Plan, MD  Note - This record has been created using [employer].  Chart creation errors have been sought, but may not always  have been located. Such creation errors do not reflect on  the standard of medical care.

## 2023-02-10 ENCOUNTER — Ambulatory Visit (HOSPITAL_BASED_OUTPATIENT_CLINIC_OR_DEPARTMENT_OTHER): Payer: Medicaid Other | Admitting: Anesthesiology

## 2023-02-10 ENCOUNTER — Encounter (HOSPITAL_BASED_OUTPATIENT_CLINIC_OR_DEPARTMENT_OTHER): Payer: Self-pay | Admitting: Obstetrics and Gynecology

## 2023-02-10 ENCOUNTER — Encounter (HOSPITAL_BASED_OUTPATIENT_CLINIC_OR_DEPARTMENT_OTHER)
Admission: RE | Disposition: A | Discharge status: Home / Self Care | Payer: Self-pay | Source: Home / Self Care | Attending: Obstetrics and Gynecology

## 2023-02-10 ENCOUNTER — Other Ambulatory Visit: Payer: Self-pay

## 2023-02-10 ENCOUNTER — Ambulatory Visit (HOSPITAL_BASED_OUTPATIENT_CLINIC_OR_DEPARTMENT_OTHER)
Admission: RE | Admit: 2023-02-10 | Discharge: 2023-02-10 | Disposition: A | Discharge status: Home / Self Care | Payer: Medicaid Other | Attending: Obstetrics and Gynecology | Admitting: Obstetrics and Gynecology

## 2023-02-10 DIAGNOSIS — K219 Gastro-esophageal reflux disease without esophagitis: Secondary | ICD-10-CM | POA: Diagnosis not present

## 2023-02-10 DIAGNOSIS — E282 Polycystic ovarian syndrome: Secondary | ICD-10-CM | POA: Diagnosis not present

## 2023-02-10 DIAGNOSIS — Z01818 Encounter for other preprocedural examination: Secondary | ICD-10-CM

## 2023-02-10 DIAGNOSIS — Z793 Long term (current) use of hormonal contraceptives: Secondary | ICD-10-CM | POA: Diagnosis not present

## 2023-02-10 DIAGNOSIS — R102 Pelvic and perineal pain: Secondary | ICD-10-CM | POA: Insufficient documentation

## 2023-02-10 DIAGNOSIS — Z9889 Other specified postprocedural states: Secondary | ICD-10-CM

## 2023-02-10 DIAGNOSIS — N839 Noninflammatory disorder of ovary, fallopian tube and broad ligament, unspecified: Secondary | ICD-10-CM | POA: Diagnosis not present

## 2023-02-10 DIAGNOSIS — G8929 Other chronic pain: Secondary | ICD-10-CM | POA: Insufficient documentation

## 2023-02-10 DIAGNOSIS — N809 Endometriosis, unspecified: Secondary | ICD-10-CM

## 2023-02-10 HISTORY — DX: Dyspnea, unspecified: R06.00

## 2023-02-10 HISTORY — DX: Polycystic ovarian syndrome: E28.2

## 2023-02-10 HISTORY — DX: Personal history of other diseases of the circulatory system: Z86.79

## 2023-02-10 HISTORY — PX: LAPAROSCOPY: SHX197

## 2023-02-10 HISTORY — DX: Prediabetes: R73.03

## 2023-02-10 LAB — POCT PREGNANCY, URINE: Preg Test, Ur: NEGATIVE

## 2023-02-10 SURGERY — LAPAROSCOPY, DIAGNOSTIC
Anesthesia: General | Site: Abdomen

## 2023-02-10 MED ORDER — LACTATED RINGERS IV SOLN: INTRAVENOUS | Status: DC

## 2023-02-10 MED ORDER — FENTANYL CITRATE (PF) 100 MCG/2ML IJ SOLN
INTRAMUSCULAR | Status: AC
  Filled 2023-02-10: qty 2

## 2023-02-10 MED ORDER — ONDANSETRON HCL 4 MG/2ML IJ SOLN
INTRAMUSCULAR | Status: AC
  Filled 2023-02-10: qty 2

## 2023-02-10 MED ORDER — PROPOFOL 10 MG/ML IV BOLUS
INTRAVENOUS | Status: DC | PRN
  Administered 2023-02-10: 200 mg via INTRAVENOUS

## 2023-02-10 MED ORDER — 0.9 % SODIUM CHLORIDE (POUR BTL) OPTIME
TOPICAL | Status: DC | PRN
  Administered 2023-02-10: 500 mL

## 2023-02-10 MED ORDER — SUGAMMADEX SODIUM 200 MG/2ML IV SOLN
INTRAVENOUS | Status: DC | PRN
  Administered 2023-02-10: 250 mg via INTRAVENOUS

## 2023-02-10 MED ORDER — KETOROLAC TROMETHAMINE 30 MG/ML IJ SOLN
INTRAMUSCULAR | Status: AC
  Filled 2023-02-10: qty 1

## 2023-02-10 MED ORDER — SILVER NITRATE-POT NITRATE 75-25 % EX MISC
CUTANEOUS | Status: DC | PRN
  Administered 2023-02-10: 1

## 2023-02-10 MED ORDER — LIDOCAINE 2% (20 MG/ML) 5 ML SYRINGE
INTRAMUSCULAR | Status: DC | PRN
  Administered 2023-02-10: 60 mg via INTRAVENOUS

## 2023-02-10 MED ORDER — ACETAMINOPHEN 500 MG PO TABS
ORAL_TABLET | ORAL | Status: AC
  Filled 2023-02-10: qty 2

## 2023-02-10 MED ORDER — MIDAZOLAM HCL 2 MG/2ML IJ SOLN
INTRAMUSCULAR | Status: DC | PRN
  Administered 2023-02-10: 2 mg via INTRAVENOUS

## 2023-02-10 MED ORDER — DEXAMETHASONE SODIUM PHOSPHATE 10 MG/ML IJ SOLN
INTRAMUSCULAR | Status: DC | PRN
  Administered 2023-02-10: 10 mg via INTRAVENOUS

## 2023-02-10 MED ORDER — ROCURONIUM BROMIDE 10 MG/ML (PF) SYRINGE
PREFILLED_SYRINGE | INTRAVENOUS | Status: DC | PRN
  Administered 2023-02-10: 60 mg via INTRAVENOUS

## 2023-02-10 MED ORDER — POVIDONE-IODINE 10 % EX SWAB: 2.0000 | Freq: Once | CUTANEOUS | Status: DC

## 2023-02-10 MED ORDER — FENTANYL CITRATE (PF) 100 MCG/2ML IJ SOLN
INTRAMUSCULAR | Status: DC | PRN
  Administered 2023-02-10 (×3): 50 ug via INTRAVENOUS

## 2023-02-10 MED ORDER — ONDANSETRON HCL 4 MG/2ML IJ SOLN
INTRAMUSCULAR | Status: DC | PRN
Start: 2023-02-10 — End: 2023-02-10
  Administered 2023-02-10: 4 mg via INTRAVENOUS

## 2023-02-10 MED ORDER — ROCURONIUM BROMIDE 10 MG/ML (PF) SYRINGE
PREFILLED_SYRINGE | INTRAVENOUS | Status: AC
  Filled 2023-02-10: qty 10

## 2023-02-10 MED ORDER — OXYCODONE-ACETAMINOPHEN 5-325 MG PO TABS: 1.0000 | ORAL_TABLET | Freq: Four times a day (QID) | ORAL | 0 refills | Status: DC | PRN

## 2023-02-10 MED ORDER — AMISULPRIDE (ANTIEMETIC) 5 MG/2ML IV SOLN: 10.0000 mg | Freq: Once | INTRAVENOUS | Status: DC | PRN

## 2023-02-10 MED ORDER — IBUPROFEN 600 MG PO TABS: 600.0000 mg | ORAL_TABLET | Freq: Four times a day (QID) | ORAL | 0 refills | Status: DC | PRN

## 2023-02-10 MED ORDER — DEXAMETHASONE SODIUM PHOSPHATE 10 MG/ML IJ SOLN
INTRAMUSCULAR | Status: AC
  Filled 2023-02-10: qty 1

## 2023-02-10 MED ORDER — ACETAMINOPHEN 500 MG PO TABS
1000.0000 mg | ORAL_TABLET | Freq: Once | ORAL | Status: AC
  Administered 2023-02-10: 1000 mg via ORAL

## 2023-02-10 MED ORDER — FENTANYL CITRATE (PF) 100 MCG/2ML IJ SOLN
25.0000 ug | INTRAMUSCULAR | Status: DC | PRN
  Administered 2023-02-10 (×2): 25 ug via INTRAVENOUS
  Administered 2023-02-10: 50 ug via INTRAVENOUS

## 2023-02-10 MED ORDER — KETOROLAC TROMETHAMINE 30 MG/ML IJ SOLN
INTRAMUSCULAR | Status: DC | PRN
  Administered 2023-02-10: 30 mg via INTRAVENOUS

## 2023-02-10 MED ORDER — BUPIVACAINE HCL 0.5 % IJ SOLN
INTRAMUSCULAR | Status: DC | PRN
  Administered 2023-02-10: 16 mL

## 2023-02-10 MED ORDER — LIDOCAINE HCL (PF) 2 % IJ SOLN
INTRAMUSCULAR | Status: AC
  Filled 2023-02-10: qty 5

## 2023-02-10 MED ORDER — MIDAZOLAM HCL 2 MG/2ML IJ SOLN
INTRAMUSCULAR | Status: AC
  Filled 2023-02-10: qty 2

## 2023-02-10 MED ORDER — PHENYLEPHRINE 80 MCG/ML (10ML) SYRINGE FOR IV PUSH (FOR BLOOD PRESSURE SUPPORT)
PREFILLED_SYRINGE | INTRAVENOUS | Status: DC | PRN
  Administered 2023-02-10 (×2): 80 ug via INTRAVENOUS

## 2023-02-10 MED ORDER — METAMUCIL 48.57 % PO POWD: ORAL | Status: AC

## 2023-02-10 SURGICAL SUPPLY — 42 items
ADH SKN CLS APL DERMABOND .7 (GAUZE/BANDAGES/DRESSINGS) ×2
APL SWBSTK 6 STRL LF DISP (MISCELLANEOUS)
APPLICATOR COTTON TIP 6 STRL (MISCELLANEOUS) ×1 IMPLANT
APPLICATOR COTTON TIP 6IN STRL (MISCELLANEOUS) IMPLANT
BLADE SURG 15 STRL LF DISP TIS (BLADE) ×2 IMPLANT
BLADE SURG 15 STRL SS (BLADE) ×2
CABLE HIGH FREQUENCY MONO STRZ (ELECTRODE) IMPLANT
DEFOGGER SCOPE WARMER CLEARIFY (MISCELLANEOUS) IMPLANT
DERMABOND ADVANCED .7 DNX12 (GAUZE/BANDAGES/DRESSINGS) ×2 IMPLANT
DRAPE SURG IRRIG POUCH 19X23 (DRAPES) ×2 IMPLANT
DRSG OPSITE POSTOP 3X4 (GAUZE/BANDAGES/DRESSINGS) IMPLANT
DURAPREP 26ML APPLICATOR (WOUND CARE) ×2 IMPLANT
ELECT REM PT RETURN 9FT ADLT (ELECTROSURGICAL) ×2
ELECTRODE REM PT RTRN 9FT ADLT (ELECTROSURGICAL) ×2 IMPLANT
GAUZE 4X4 16PLY ~~LOC~~+RFID DBL (SPONGE) IMPLANT
GLOVE BIOGEL PI IND STRL 7.0 (GLOVE) ×2 IMPLANT
GLOVE BIOGEL PI IND STRL 7.5 (GLOVE) ×4 IMPLANT
GLOVE SURG SS PI 7.0 STRL IVOR (GLOVE) ×2 IMPLANT
GOWN STRL REUS W/TWL XL LVL3 (GOWN DISPOSABLE) ×2 IMPLANT
IRRIG SUCT STRYKERFLOW 2 WTIP (MISCELLANEOUS)
IRRIGATION SUCT STRKRFLW 2 WTP (MISCELLANEOUS) ×1 IMPLANT
KIT PINK PAD W/HEAD ARE REST (MISCELLANEOUS) ×2
KIT PINK PAD W/HEAD ARM REST (MISCELLANEOUS) ×2 IMPLANT
KIT TURNOVER CYSTO (KITS) ×2 IMPLANT
LIGASURE VESSEL 5MM BLUNT TIP (ELECTROSURGICAL) ×1 IMPLANT
NS IRRIG 1000ML POUR BTL (IV SOLUTION) ×1 IMPLANT
NS IRRIG 500ML POUR BTL (IV SOLUTION) ×1 IMPLANT
PACK LAPAROSCOPY BASIN (CUSTOM PROCEDURE TRAY) ×2 IMPLANT
PAD OB MATERNITY 4.3X12.25 (PERSONAL CARE ITEMS) ×2 IMPLANT
POUCH LAPAROSCOPIC INSTRUMENT (MISCELLANEOUS) ×2 IMPLANT
SCISSORS LAP 5X35 DISP (ENDOMECHANICALS) IMPLANT
SET TUBE SMOKE EVAC HIGH FLOW (TUBING) ×2 IMPLANT
SLEEVE ADV FIXATION 5X100MM (TROCAR) ×2 IMPLANT
SLEEVE SCD COMPRESS KNEE MED (STOCKING) ×2 IMPLANT
SUT MON AB 4-0 PS1 27 (SUTURE) ×2 IMPLANT
SUT VICRYL 0 UR6 27IN ABS (SUTURE) ×2 IMPLANT
SYS BAG RETRIEVAL 10MM (BASKET)
SYSTEM BAG RETRIEVAL 10MM (BASKET) IMPLANT
TOWEL OR 17X24 6PK STRL BLUE (TOWEL DISPOSABLE) ×2 IMPLANT
TRAY FOLEY W/BAG SLVR 14FR LF (SET/KITS/TRAYS/PACK) ×1 IMPLANT
TROCAR ADV FIXATION 5X100MM (TROCAR) ×2 IMPLANT
TROCAR BALLN 12MMX100 BLUNT (TROCAR) ×1 IMPLANT

## 2023-02-10 NOTE — Brief Op Note (Signed)
02/10/2023  1:42 PM  PATIENT:  Jacqueline Gonzalez  22 y.o. female  PRE-OPERATIVE DIAGNOSIS:  Pelvic Pain  POST-OPERATIVE DIAGNOSIS:  Pelvic Pain  PROCEDURE:  Procedure(s): LAPAROSCOPY DIAGNOSTIC (N/A)  SURGEON:  Surgeons and Role:    * Rosston Bing, MD - Primary  ASSISTANTS: none   ANESTHESIA:   local and general  EBL:  5 mL   BLOOD ADMINISTERED:none  DRAINS:  indwelling foley 50mL UOP    LOCAL MEDICATIONS USED:  MARCAINE     SPECIMEN:  No Specimen  DISPOSITION OF SPECIMEN:  N/A  COUNTS:  YES  TOURNIQUET:  * No tourniquets in log *  DICTATION: .Note written in EPIC  PLAN OF CARE: Discharge to home after PACU  PATIENT DISPOSITION:  PACU - hemodynamically stable.   Delay start of Pharmacological VTE agent (>24hrs) due to surgical blood loss or risk of bleeding: n/a  Cornelia Copa MD Attending Center for [facility])

## 2023-02-10 NOTE — Discharge Instructions (Addendum)
Laparoscopic Surgery Discharge Instructions  You have just undergone a laparoscopic surgery.  The following list should answer your most common questions.  Although we will discuss your surgery and post-operative instructions with you prior to your discharge, this list will serve as a reminder if you fail to recall the details of what we discussed.  We will discuss your surgery once again in detail at your post-op visit in two to four weeks. If you haven't already done so, please call to make your appointment as soon as possible.  How you will feel: Although you have just undergone a major surgery, your recovery will be significantly shorter since the surgery was performed through much smaller incisions than the traditional approach.  You should feel slightly better each day.  If you suddenly feel much worse than the prior day, please call the clinic.  It's important during the early part of your recovery that you maintain some activity.  Walking is encouraged.  You will quicken your recovery by continued activity.  Incision:  Your incisions will be closed with dissolvable stitches or surgical adhesive (glue).  There may be Band-aids and/or Steri-strips covering your incisions.  If there is no drainage from the incisions you may remove the Band-aids in one to two days.  You may notice some minor bruising at the incision sites.  This is common and will resolve within several days.  Please inform us if the redness at the edges of your incision appears to be spreading.  If the skin around your incision becomes warm to the touch, or if you notice a pus-like drainage, please call the office.  Stairs/Driving/Activities: You may climb stairs if necessary.  If you've had general anesthesia, do not drive a car the rest of the day today.  You may begin light housework when you feel up to it, but avoid heavy lifting (more than 15-20lbs) or pushing until cleared for these activities by your physician.  Hygiene:  Do  not soak your incisions.  Showers are acceptable but you may not take a bath or swim in a pool.  Cleanse your incisions daily with soap and water.  Medications:  Please resume taking any medications that you were taking prior to the surgery.  If we have prescribed any new medications for you, please take them as directed.  Constipation:  It is fairly common to experience some difficulty in moving your bowels following major surgery.  Being active will help to reduce this likelihood. A diet rich in fiber and plenty of liquids is desirable.  If you do become constipated, a mild laxative such as Miralax, Milk of Magnesia, or Metamucil, or a stool softener such as Colace, is recommended.  General Instructions: If you develop a fever of 100.5 degrees or higher, please call the office number(s) below for physician on call.        No acetaminophen/Tylenol until after 4;45 pm today if needed.  No ibuprofen, Advil, Aleve, Motrin, ketorolac, meloxicam, naproxen, or other NSAIDS until after 7:30 pm today if needed.  Post Anesthesia Home Care Instructions  Activity: Get plenty of rest for the remainder of the day. A responsible individual must stay with you for 24 hours following the procedure.  For the next 24 hours, DO NOT: -Drive a car -Advertising copywriter -Drink alcoholic beverages -Take any medication unless instructed by your physician -Make any legal decisions or sign important papers.  Meals: Start with liquid foods such as gelatin or soup. Progress to regular foods as tolerated.  Avoid greasy, spicy, heavy foods. If nausea and/or vomiting occur, drink only clear liquids until the nausea and/or vomiting subsides. Call your physician if vomiting continues.  Special Instructions/Symptoms: Your throat may feel dry or sore from the anesthesia or the breathing tube placed in your throat during surgery. If this causes discomfort, gargle with warm salt water. The discomfort should disappear within  24 hours.

## 2023-02-10 NOTE — Anesthesia Procedure Notes (Signed)
Procedure Name: Intubation Date/Time: 02/10/2023 1:47 PM  Performed by: Francie Massing, CRNAPre-anesthesia Checklist: Patient identified, Emergency Drugs available, Suction available and Patient being monitored Patient Re-evaluated:Patient Re-evaluated prior to induction Oxygen Delivery Method: Circle system utilized Preoxygenation: Pre-oxygenation with 100% oxygen Induction Type: IV induction Ventilation: Mask ventilation without difficulty Laryngoscope Size: Mac and 3 Grade View: Grade I Tube type: Oral Tube size: 7.0 mm Number of attempts: 1 Airway Equipment and Method: Stylet and Oral airway Placement Confirmation: ETT inserted through vocal cords under direct vision, positive ETCO2 and breath sounds checked- equal and bilateral Secured at: 22 cm Tube secured with: Tape Dental Injury: Teeth and Oropharynx as per pre-operative assessment

## 2023-02-10 NOTE — H&P (Signed)
Obstetrics & Gynecology Surgical H&P   Date of Surgery: 02/10/2023   Primary OBGYN: [facility]  Reason for Admission: scheduled surgery  History of Present Illness: Ms. Kitner is a 22 y.o. G0P0000 (Patient's last menstrual period was 09/22/2022 (approximate).), with the above CC.  Her past medical history is significant for h/o PID in August 2023 with h/o peri-rectal abscess and I&D by General Surgery, PCOS with oligomenorrhea, LSIL pap   Patient last seen by me in late Feb 2024 and I told her that her s/s are likely from the h/o PID, scar tissue and recommended pelvic floor PT, following up with GI and ensuring she has a regular menstrual cycle; her u/s at that time was wnl except with normal sized ovaries but c/w PCOS. She has been seeing PT but no change in s/s and she has seen GI and they are trying some different medications but nothing overt to explain her pain. She states she took the cyclic provera for 14 days and had a period a a few days later, which was in late March, and she states the period was heavy and painful for approximately a week; this was the first period she had since last year.    Patient last seen by me on 5/1 and she was continuing to have lower abdominal, chronic pain. Options d/w her and she elected for diagnostic laparoscopy  Interval History: LMP march, back when she did the cyclic provera. Patient still with low belly discomfort.   ROS: A 12-point review of systems was performed and negative, except as stated in the above HPI.  OBGYN History: As per HPI. OB History  Gravida Para Term Preterm AB Living  0 0 0 0 0 0  SAB IAB Ectopic Multiple Live Births  0 0 0 0 0   History of pap smears: Yes. Last pap smear 07/2022 LSIL, GC/CT/trich testing negative.    Past Medical History: Past Medical History:  Diagnosis Date   Anemia    Anxiety    Arthritis    knees   Chlamydia 11/13/2021   Constipation, chronic    Depression     Dyspnea    ON OCC   Environmental allergies    Ganglion cyst of dorsum of left wrist    RECURRENT   GERD (gastroesophageal reflux disease)    watches diet   Headache MIGRAINE    History of hypertension    IN PAST, NO RECENT HTN PER PT 01-19-2023   IBS (irritable bowel syndrome)    Lactose intolerance    PCOS (polycystic ovarian syndrome)    Pre-diabetes     Past Surgical History: Past Surgical History:  Procedure Laterality Date   GANGLION CYST EXCISION Left 04/24/2021   Procedure: left recurrent dorsal carpal ganglion excision;  Surgeon: Gomez Cleverly, MD;  Location: [facility];  Service: Orthopedics;  Laterality: Left;   INCISION AND DRAINAGE PERIRECTAL ABSCESS N/A 03/14/2022   Procedure: IRRIGATION AND DEBRIDEMENT PERIRECTAL ABSCESS;  Surgeon: Karie Soda, MD;  Location: [facility];  Service: General;  Laterality: N/A;   TONSILLECTOMY AND ADENOIDECTOMY     AGE 22   WISDOM TOOTH EXTRACTION  03/18/2021   WRIST GANGLION EXCISION Left 02/2020    Family History:  Family History  Problem Relation Age of Onset   Heart failure Mother    Migraines Mother    Irritable bowel syndrome Mother    Other Mother 63       Tricuspid valve regurgitation   Asthma  Father    Hypertension Maternal Grandmother    Diabetes Maternal Grandmother    Hypertension Maternal Grandfather    Diabetes Maternal Grandfather    Hypertension Paternal Grandmother    Stroke Paternal Grandmother    Diabetes Paternal Grandmother    Hypertension Paternal Grandfather    Diabetes Paternal Grandfather    Kidney disease Paternal Grandfather    Ovarian cancer Maternal Aunt    Graves' disease Paternal Aunt    Stomach cancer Neg Hx    Esophageal cancer Neg Hx    Colon cancer Neg Hx     Social History:  Social History   Socioeconomic History   Marital status: Single    Spouse name: Not on file   Number of children: 0   Years of education: Not on file   Highest education level: Not on file   Occupational History   Occupation: student  Tobacco Use   Smoking status: Never    Passive exposure: Past   Smokeless tobacco: Never  Vaping Use   Vaping status: Never Used  Substance and Sexual Activity   Alcohol use: Yes    Comment: occasionally   Drug use: Never    Comment: Smokes CBD   Sexual activity: Yes    Birth control/protection: OCP  Other Topics Concern   Not on file  Social History Narrative   Not on file   Social Determinants of Health   Financial Resource Strain: Not on file  Food Insecurity: Not on file  Transportation Needs: Not on file  Physical Activity: Not on file  Stress: No Stress Concern Present (07/04/2021)   [employer] of Occupational Health - Occupational Stress Questionnaire    Feeling of Stress : Only a little  Recent Concern: Stress - Stress Concern Present (06/20/2021)   [employer] of Occupational Health - Occupational Stress Questionnaire    Feeling of Stress : To some extent  Social Connections: Not on file  Intimate Partner Violence: Not on file    Allergy: Allergies  Allergen Reactions   Blue Dyes (Parenteral) Anaphylaxis   Blueberry [Vaccinium Angustifolium] Anaphylaxis   Raspberry Anaphylaxis   Shellfish Allergy Anaphylaxis    ALL SHELLFISH   Amitriptyline Other (See Comments)    Fast heart rate and throat closing   Sumatriptan Swelling    Throat swelling/Increased HR per pt    Current Outpatient Medications: Medications Prior to Admission  Medication Sig Dispense Refill Last Dose   ADVAIR DISKUS 100-50 MCG/ACT AEPB Inhale 1 puff into the lungs 2 (two) times daily.   02/10/2023 at 0900   albuterol (VENTOLIN HFA) 108 (90 Base) MCG/ACT inhaler Inhale into the lungs every 6 (six) hours as needed for wheezing or shortness of breath.   Past Month   cetirizine (ZYRTEC) 10 MG tablet Take 1 tablet (10 mg total) by mouth daily. 30 tablet 0 02/10/2023 at 0900   dicyclomine (BENTYL) 20 MG tablet Take 1 tablet (20 mg total)  by mouth 2 (two) times daily for 7 days. 14 tablet 0 Past Month   FAMOTIDINE PO Take by mouth daily.   02/10/2023 at 0900   FLUoxetine (PROZAC) 40 MG capsule Take 1 capsule (40 mg total) by mouth daily. (Patient taking differently: Take 40 mg by mouth every morning.) 90 capsule 3 02/10/2023 at 0900   ibuprofen (ADVIL) 800 MG tablet Take 1 tablet (800 mg total) by mouth 3 (three) times daily. 21 tablet 0 Past Month   pantoprazole (PROTONIX) 40 MG tablet Take 1 tablet (40 mg  total) by mouth 2 (two) times daily before a meal. (Patient taking differently: Take 40 mg by mouth as needed.) 60 tablet 6 02/10/2023 at 0900   Vitamin D, Ergocalciferol, (DRISDOL) 1.25 MG (50000 UNIT) CAPS capsule Take 50,000 Units by mouth every 7 (seven) days.   Past Week   DTx App - Gastrointestinal [facility] IBS) MISC Use as directed (Patient not taking: Reported on 01/08/2023) 11 each 1    EMGALITY 120 MG/ML SOAJ Inject into the skin.   02/08/2023   fluticasone (FLONASE) 50 MCG/ACT nasal spray Place 2 sprays into both nostrils daily. 16 g 0 More than a month   Galcanezumab-gnlm (EMGALITY [place]) Inject 1 Dose into the skin every 30 (thirty) days. (Patient not taking: Reported on 02/09/2023)      hydroxychloroquine (PLAQUENIL) 200 MG tablet Take 1 tablet (200 mg total) by mouth daily. (Patient taking differently: Take 200 mg by mouth daily. Has not started yet) 60 tablet 2    medroxyPROGESTERone (PROVERA) 10 MG tablet 1 tab po qday x 14 days each month. You can expect a period to start after the finishing the 14 days. 42 tablet 3 More than a month   oxyCODONE (ROXICODONE) 5 MG immediate release tablet Take 1 tablet (5 mg total) by mouth every 4 (four) hours as needed for severe pain. 10 tablet 0 More than a month   Psyllium (METAMUCIL PO) Take 1 packet by mouth daily as needed (constipation).   More than a month     Hospital Medications: Current Facility-Administered Medications  Medication Dose Route Frequency Provider Last Rate Last  Admin   lactated ringers infusion   Intravenous Continuous Lowella Curb, MD 50 mL/hr at 02/10/23 1059 New Bag at 02/10/23 1059   lactated ringers infusion   Intravenous Continuous Hartley Bing, MD       povidone-iodine 10 % swab 2 Application  2 Application Topical Once Levie Heritage, DO         Physical Exam:  Current Vital Signs 24h Vital Sign Ranges  T 98.3 F (36.8 C) Temp  Avg: 98.3 F (36.8 C)  Min: 98.3 F (36.8 C)  Max: 98.3 F (36.8 C)  BP 133/71 BP  Min: 114/76  Max: 133/71  HR 70 Pulse  Avg: 78.5  Min: 70  Max: 87  RR 18 Resp  Avg: 16  Min: 14  Max: 18  SaO2 98 % Room Air SpO2  Avg: 98 %  Min: 98 %  Max: 98 %       24 Hour I/O Current Shift I/O  Time Ins Outs No intake/output data recorded. No intake/output data recorded.    Body mass index is 38.67 kg/m. General appearance: Well nourished, well developed female in no acute distress.  Cardiovascular: S1, S2 normal, no murmur, rub or gallop, regular rate and rhythm Respiratory:  Clear to auscultation bilateral. Normal respiratory effort Abdomen: positive bowel sounds and no masses, hernias; diffusely non tender to palpation, non distended Neuro/Psych:  Normal mood and affect.  Skin:  Warm and dry.  Extremities: no clubbing, cyanosis, or edema.   Laboratory: UPT: negative  Imaging:  01/02/23 CT pelvis w/ contrast negative 08/2022 u/s negative except findings c/w PCOS  Assessment: patient stable  Plan: D/w her re: diagnostic laparoscopy, including risk to surrounding structures, need for exploratory laparotomy, etc and pt desires to proceed.  Can proceed when OR is ready   Cornelia Copa MD Attending Center for [facility])

## 2023-02-10 NOTE — Anesthesia Preprocedure Evaluation (Signed)
Anesthesia Evaluation  Patient identified by MRN, date of birth, ID band Patient awake    Reviewed: Allergy & Precautions, NPO status , Patient's Chart, lab work & pertinent test results  Airway Mallampati: II  TM Distance: >3 FB Neck ROM: Full    Dental  (+) Dental Advisory Given   Pulmonary neg pulmonary ROS   breath sounds clear to auscultation       Cardiovascular negative cardio ROS  Rhythm:Regular Rate:Normal     Neuro/Psych  Headaches    GI/Hepatic Neg liver ROS,GERD  ,,  Endo/Other  negative endocrine ROS    Renal/GU negative Renal ROS     Musculoskeletal  (+) Arthritis ,    Abdominal   Peds  Hematology  (+) Blood dyscrasia, anemia   Anesthesia Other Findings   Reproductive/Obstetrics                             Anesthesia Physical Anesthesia Plan  ASA: 2  Anesthesia Plan: General   Post-op Pain Management: Tylenol PO (pre-op)* and Toradol IV (intra-op)*   Induction: Intravenous  PONV Risk Score and Plan: 3 and Midazolam, Dexamethasone and Ondansetron  Airway Management Planned: Oral ETT  Additional Equipment: None  Intra-op Plan:   Post-operative Plan: Extubation in OR  Informed Consent: I have reviewed the patients History and Physical, chart, labs and discussed the procedure including the risks, benefits and alternatives for the proposed anesthesia with the patient or authorized representative who has indicated his/her understanding and acceptance.     Dental advisory given  Plan Discussed with: CRNA  Anesthesia Plan Comments:        Anesthesia Quick Evaluation

## 2023-02-10 NOTE — Op Note (Signed)
Operative Note   02/10/2023  PRE-OP DIAGNOSIS: Chronic pelvic pain. History of PID. History of peri-rectal abscess   POST-OP DIAGNOSIS: Same   SURGEON: Surgeons and Role:    Sheyenne Bing, MD - Primary  ASSISTANT: None  ANESTHESIA: General and local  PROCEDURE: Diagnostic laparoscopy  ESTIMATED BLOOD LOSS: 5mL  DRAINS: indwelling foley, UOP 50mL   TOTAL IV FLUIDS: per anesthesia note  SPECIMENS: none   VTE PROPHYLAXIS: SCDs to the bilateral lower extremities  ANTIBIOTICS: not indicated  COMPLICATIONS: none  DISPOSITION: PACU - hemodynamically stable.  CONDITION: stable  FINDINGS: Exam under anesthesia showed normal EGBUS, vagina and cervix; patient sounded to 8cm and bimanual exam negative. On diagnostic laparoscopy, normal appearing, small, mid plane uterus; normal appearing tubes; normal ovaries with a tiny 5mm right para-tubal cyst; normal anterior and posterior cul de sacs; normal bilateral para-ovarian fossae. Also, normal appearing liver edge, and stomach edge and normal appearing appendix. Ureters also seen and traced down into the pelvis bilaterally.  The only adhesion was seen a few cm above the right pelvic brim with filmy adhesions from the posterior abdominal wall to large bowel in that area.   PROCEDURE IN DETAIL: The patient was taken to the OR where anesthesia was administed. The patient was positioned in dorsal lithotomy in the [place] stirrups. The patient was then examined under anesthesia with the above noted findings. The patient was prepped and draped in the normal sterile fashion and foley catheter was placed. A Graves speculum was placed in the vagina and the anterior lip of the cervix was grasped with a single toothed tenaculum.  A Hulka uterine manipulator was then inserted in the uterus and uterine mobility was found to be satisfactory; the speculum and tenaculum were then removed.  After changing gloves, attention was turned to the patient's  abdomen where a 12mm skin incision was made in the umbilical fold, after injection of local anesthesia. Using the open technique, the abdomen was entered and the balloon trocar placed; pneumoperitoneum was then obtained. The operative laparoscope was introduced into the abdomen with the above noted findings, after inspection of the entry site and then placing the patient in Trendelenburg. Using a blunt probe, I had the above noted findings    The fascia at the umbilical incision was reapproximated with 0 vicryl. The skin was closed with 4-0 monocryl and dermabond. The Hulka was removed with no bleeding noted from the cervix and all other instrumentation was removed from the vagina.  The foley catheter was removed. The patient tolerated the procedure well. All counts were correct x 2. The patient was transferred to the recovery room awake, alert and breathing independently.   Cornelia Copa MD Attending Center for [employer] [facility])

## 2023-02-10 NOTE — Transfer of Care (Signed)
Immediate Anesthesia Transfer of Care Note  Patient: Jacqueline Gonzalez  Procedure(s) Performed: Procedure(s) (LRB): LAPAROSCOPY DIAGNOSTIC (N/A)  Patient Location: PACU  Anesthesia Type: General  Level of Consciousness: awake, oriented, sedated and patient cooperative  Airway & Oxygen Therapy: Patient Spontanous Breathing and Patient connected to face mask oxygen  Post-op Assessment: Report given to PACU RN and Post -op Vital signs reviewed and stable  Post vital signs: Reviewed and stable  Complications: No apparent anesthesia complications Vitals Value Taken Time  BP 138/91 02/10/23 1350  Temp    Pulse 93 02/10/23 1353  Resp 13 02/10/23 1353  SpO2 100 % 02/10/23 1353  Vitals shown include unfiled device data.  Last Pain:  Vitals:   02/10/23 1038  TempSrc: Oral  PainSc: 7       Patients Stated Pain Goal: 7 (02/10/23 1038)  Complications: No notable events documented.

## 2023-02-11 ENCOUNTER — Encounter (HOSPITAL_BASED_OUTPATIENT_CLINIC_OR_DEPARTMENT_OTHER): Payer: Self-pay | Admitting: Obstetrics and Gynecology

## 2023-02-11 NOTE — Anesthesia Postprocedure Evaluation (Signed)
Anesthesia Post Note  Patient: Jacqueline Gonzalez  Procedure(s) Performed: LAPAROSCOPY DIAGNOSTIC (Abdomen)     Patient location during evaluation: PACU Anesthesia Type: General Level of consciousness: awake and alert Pain management: pain level controlled Vital Signs Assessment: post-procedure vital signs reviewed and stable Respiratory status: spontaneous breathing, nonlabored ventilation, respiratory function stable and patient connected to nasal cannula oxygen Cardiovascular status: blood pressure returned to baseline and stable Postop Assessment: no apparent nausea or vomiting Anesthetic complications: no   No notable events documented.  Last Vitals:  Vitals:   02/10/23 1430 02/10/23 1515  BP: 135/87 (!) 122/90  Pulse: 81 71  Resp: 20 18  Temp:  36.5 C  SpO2: 91% 98%    Last Pain:  Vitals:   02/10/23 1515  TempSrc:   PainSc: 6                  Kennieth Rad

## 2023-02-16 ENCOUNTER — Telehealth: Payer: Self-pay | Admitting: *Deleted

## 2023-02-16 NOTE — Telephone Encounter (Signed)
Called pt back, pt states she is still having a lot of pain and swelling in her right lower abdomen. Pt has been taking ibuprofen every 6 hours and has run out of the percocet. Informed pt I will reach out to Dr Vergie Living and see what he recommends and call pt back.

## 2023-02-17 ENCOUNTER — Ambulatory Visit (INDEPENDENT_AMBULATORY_CARE_PROVIDER_SITE_OTHER): Payer: Medicaid Other | Admitting: Family Medicine

## 2023-02-17 DIAGNOSIS — G8918 Other acute postprocedural pain: Secondary | ICD-10-CM | POA: Diagnosis not present

## 2023-02-17 MED ORDER — OXYCODONE-ACETAMINOPHEN 5-325 MG PO TABS
1.0000 | ORAL_TABLET | Freq: Four times a day (QID) | ORAL | 0 refills | Status: DC | PRN
Start: 2023-02-17 — End: 2024-03-28

## 2023-02-17 MED ORDER — GABAPENTIN 100 MG PO CAPS: 100.0000 mg | ORAL_CAPSULE | Freq: Three times a day (TID) | ORAL | 0 refills | Status: DC

## 2023-02-17 NOTE — Progress Notes (Signed)
   Subjective:    Patient ID: Jacqueline Gonzalez is a 22 y.o. female presenting with No chief complaint on file.  on 02/17/2023  HPI: Patient had a dx scope with no energy used one week ago. Still having pain and low grade temps. Reports pain is on the right side and is 9/10. Taking Ibuprofen 600 mg q 6 hours. She reports nausea, without emesis. Has some acid reflux.  Review of Systems  Constitutional:  Positive for fever (100.3 - resolved). Negative for chills.  Respiratory:  Negative for shortness of breath.   Cardiovascular:  Negative for chest pain.  Gastrointestinal:  Positive for abdominal pain and constipation. Negative for nausea and vomiting.  Genitourinary:  Negative for dysuria.  Skin:  Negative for rash.      Objective:    There were no vitals taken for this visit. Physical Exam Exam conducted with a chaperone present.  Constitutional:      General: She is not in acute distress.    Appearance: She is well-developed.  HENT:     Head: Normocephalic and atraumatic.  Eyes:     General: No scleral icterus. Cardiovascular:     Rate and Rhythm: Normal rate.  Pulmonary:     Effort: Pulmonary effort is normal.  Abdominal:     General: Bowel sounds are normal. There is no distension.     Palpations: Abdomen is soft.     Tenderness: There is abdominal tenderness. There is no guarding or rebound.  Musculoskeletal:     Cervical back: Neck supple.  Skin:    General: Skin is warm and dry.  Neurological:     Mental Status: She is alert and oriented to person, place, and time.         Assessment & Plan:  Postoperative pain - Doubt significant pathology--add gabapentin, refill narcotic x a few  Return in about 2 weeks (around 03/03/2023) for postop check.  Reva Bores, MD 02/17/2023 3:24 PM

## 2023-03-03 ENCOUNTER — Ambulatory Visit: Payer: Medicaid Other | Admitting: Gastroenterology

## 2023-03-04 ENCOUNTER — Ambulatory Visit (INDEPENDENT_AMBULATORY_CARE_PROVIDER_SITE_OTHER): Payer: Medicaid Other | Admitting: Obstetrics and Gynecology

## 2023-03-04 ENCOUNTER — Encounter: Payer: Self-pay | Admitting: Obstetrics and Gynecology

## 2023-03-04 VITALS — BP 108/75 | HR 82 | Wt 256.0 lb

## 2023-03-04 DIAGNOSIS — G8918 Other acute postprocedural pain: Secondary | ICD-10-CM

## 2023-03-04 NOTE — Progress Notes (Signed)
RGYN pt here for Post-Op.   CC: Pain 8/10, stomach pain, still notes pain on right side has been there since surgery.  No relief with Ibuprofen

## 2023-03-04 NOTE — Progress Notes (Signed)
Obstetrics and Gynecology Visit Return Patient Evaluation  Appointment Date: 03/04/2023  Primary Care Provider: Levonne Lapping  OBGYN Clinic: [facility] for [facility]  Chief Complaint: regular post op visit  History of Present Illness:  Jacqueline Gonzalez is a 22 y.o. s/p 7/24 diagnostic laparoscopy for chronic pelvic pain. Only finding was an,  adhesion was seen a few cm above the right pelvic brim with filmy adhesions from the posterior abdominal wall to large bowel in that area".   Everything else was normal:  Exam under anesthesia showed normal EGBUS, vagina and cervix; patient sounded to 8cm and bimanual exam negative. On diagnostic laparoscopy, normal appearing, small, mid plane uterus; normal appearing tubes; normal ovaries with a tiny 5mm right para-tubal cyst; normal anterior and posterior cul de sacs; normal bilateral para-ovarian fossae. Also, normal appearing liver edge, and stomach edge and normal appearing appendix. Ureters also seen and traced down into the pelvis bilaterally.   Patient still endorses the same, unchange lower belly pain and also notes some right sided belly discomfort since the surgery; the only incision for the surgery was at the umbilicus.   Review of Systems: as noted in the History of Present Illness.  Patient Active Problem List   Diagnosis Date Noted   Seronegative inflammatory arthritis 02/09/2023   Precordial pain 01/28/2023   Positive ANA (antinuclear antibody) 01/08/2023   IBS (irritable bowel syndrome) 01/08/2023   Sedimentation rate elevation 01/08/2023   Polyarthralgia 01/08/2023   Vitamin D deficiency 01/08/2023   PCOS (polycystic ovarian syndrome) 08/26/2022   LGSIL of cervix of undetermined significance 08/26/2022   History of PID 08/18/2022   Rectal abscess supralevator s/p I&D 03/14/2022 03/14/2022   Rectal pain    UTI (urinary tract infection) 03/10/2022   Rectal bleeding    Anal fissure    Abnormal CT scan, colon     Proctocolitis 03/09/2022   Menorrhagia with irregular cycle 11/18/2021   Dysmenorrhea 11/18/2021   Chronic anemia 11/17/2021   Lower abdominal pain 11/07/2021   Moderate episode of recurrent major depressive disorder (HCC) 06/23/2021   Low back pain 05/02/2021   Headache 09/03/2020   Nonspecific abdominal pain 08/30/2020   Ganglion cyst of dorsum of left wrist 11/03/2019   Constipation, chronic 01/21/2018   Irregular periods 01/21/2018   Generalized anxiety disorder 01/21/2018   Medications:  Tanikia Trindle had no medications administered during this visit. Current Outpatient Medications  Medication Sig Dispense Refill   ADVAIR DISKUS 100-50 MCG/ACT AEPB Inhale 1 puff into the lungs 2 (two) times daily.     albuterol (VENTOLIN HFA) 108 (90 Base) MCG/ACT inhaler Inhale into the lungs every 6 (six) hours as needed for wheezing or shortness of breath.     cetirizine (ZYRTEC) 10 MG tablet Take 1 tablet (10 mg total) by mouth daily. 30 tablet 0   EMGALITY 120 MG/ML SOAJ Inject into the skin.     FLUoxetine (PROZAC) 40 MG capsule Take 1 capsule (40 mg total) by mouth daily. (Patient taking differently: Take 40 mg by mouth every morning.) 90 capsule 3   fluticasone (FLONASE) 50 MCG/ACT nasal spray Place 2 sprays into both nostrils daily. 16 g 0   hydroxychloroquine (PLAQUENIL) 200 MG tablet Take 1 tablet (200 mg total) by mouth daily. (Patient taking differently: Take 200 mg by mouth daily. Has not started yet) 60 tablet 2   ibuprofen (ADVIL) 600 MG tablet Take 1 tablet (600 mg total) by mouth every 6 (six) hours as needed. 30 tablet 0  pantoprazole (PROTONIX) 40 MG tablet Take 1 tablet (40 mg total) by mouth 2 (two) times daily before a meal. (Patient taking differently: Take 40 mg by mouth as needed.) 60 tablet 6   dicyclomine (BENTYL) 20 MG tablet Take 1 tablet (20 mg total) by mouth 2 (two) times daily for 7 days. 14 tablet 0   DTx App - Gastrointestinal [facility] IBS) MISC Use as  directed (Patient not taking: Reported on 01/08/2023) 11 each 1   FAMOTIDINE PO Take by mouth daily.     gabapentin (NEURONTIN) 100 MG capsule Take 1 capsule (100 mg total) by mouth 3 (three) times daily. (Patient not taking: Reported on 03/04/2023) 30 capsule 0   Galcanezumab-gnlm (EMGALITY ) Inject 1 Dose into the skin every 30 (thirty) days. (Patient not taking: Reported on 02/09/2023)     medroxyPROGESTERone (PROVERA) 10 MG tablet 1 tab po qday x 14 days each month. You can expect a period to start after the finishing the 14 days. (Patient not taking: Reported on 03/04/2023) 42 tablet 3   oxyCODONE-acetaminophen (PERCOCET/ROXICET) 5-325 MG tablet Take 1 tablet by mouth every 6 (six) hours as needed. (Patient not taking: Reported on 03/04/2023) 8 tablet 0   Psyllium (METAMUCIL) 48.57 % POWD 1 spoonful daily for the next 14 days (Patient not taking: Reported on 03/04/2023)     Vitamin D, Ergocalciferol, (DRISDOL) 1.25 MG (50000 UNIT) CAPS capsule Take 50,000 Units by mouth every 7 (seven) days. (Patient not taking: Reported on 03/04/2023)     No current facility-administered medications for this visit.    Allergies: is allergic to blue dyes (parenteral), blueberry [vaccinium angustifolium], raspberry, shellfish allergy, amitriptyline, and sumatriptan.  Physical Exam:  BP 108/75   Pulse 82   Wt 256 lb (116.1 kg)   BMI 38.92 kg/m  Body mass index is 38.92 kg/m. General appearance: Well nourished, well developed female in no acute distress.  Abdomen: well healed umbilical incision. No masses, hernias, distension diffusely, +BS Neuro/Psych:  Normal mood and affect.    Assessment: patient stable  Plan:  1. Chronic pelvic pain I d/w her no etiology seen and I don't believe the adhesion seen is the source of her pain and that adhesiolysis has risks associated with it and studies show that any relief is temporary and pain and scar tissue to come back.   RTC: PRN   Future Appointments  Date  Time Provider Department Center  04/12/2023  2:40 PM Rice, Jamesetta Orleans, MD [facility] None    [place] Bing, Montez Hageman MD Attending Center for [facility])

## 2023-03-30 ENCOUNTER — Emergency Department (HOSPITAL_BASED_OUTPATIENT_CLINIC_OR_DEPARTMENT_OTHER)
Admission: EM | Admit: 2023-03-30 | Discharge: 2023-03-30 | Disposition: A | Discharge status: Home / Self Care | Payer: Medicaid Other | Attending: Emergency Medicine | Admitting: Emergency Medicine

## 2023-03-30 ENCOUNTER — Emergency Department (HOSPITAL_BASED_OUTPATIENT_CLINIC_OR_DEPARTMENT_OTHER): Payer: Medicaid Other

## 2023-03-30 ENCOUNTER — Encounter (HOSPITAL_BASED_OUTPATIENT_CLINIC_OR_DEPARTMENT_OTHER): Payer: Self-pay

## 2023-03-30 DIAGNOSIS — I10 Essential (primary) hypertension: Secondary | ICD-10-CM | POA: Diagnosis not present

## 2023-03-30 DIAGNOSIS — R1030 Lower abdominal pain, unspecified: Secondary | ICD-10-CM | POA: Insufficient documentation

## 2023-03-30 DIAGNOSIS — N760 Acute vaginitis: Secondary | ICD-10-CM

## 2023-03-30 DIAGNOSIS — Z87891 Personal history of nicotine dependence: Secondary | ICD-10-CM | POA: Insufficient documentation

## 2023-03-30 DIAGNOSIS — R197 Diarrhea, unspecified: Secondary | ICD-10-CM | POA: Insufficient documentation

## 2023-03-30 DIAGNOSIS — R110 Nausea: Secondary | ICD-10-CM | POA: Diagnosis not present

## 2023-03-30 LAB — WET PREP, GENITAL
Sperm: NONE SEEN
Trich, Wet Prep: NONE SEEN
WBC, Wet Prep HPF POC: <10 (ref <10)
Yeast Wet Prep HPF POC: NONE SEEN

## 2023-03-30 LAB — COMPREHENSIVE METABOLIC PANEL
ALT: 13 U/L (ref 0–44)
AST: 11 U/L — ABNORMAL LOW (ref 15–41)
Albumin: 4 g/dL (ref 3.5–5.0)
Alkaline Phosphatase: 69 U/L (ref 38–126)
Anion gap: 8 (ref 5–15)
BUN: 6 mg/dL (ref 6–20)
CO2: 25 mmol/L (ref 22–32)
Calcium: 8.6 mg/dL — ABNORMAL LOW (ref 8.9–10.3)
Chloride: 106 mmol/L (ref 98–111)
Creatinine, Ser: 0.75 mg/dL (ref 0.44–1.00)
GFR, Estimated: >60 mL/min (ref >60)
Glucose, Bld: 110 mg/dL — ABNORMAL HIGH (ref 70–99)
Potassium: 3.8 mmol/L (ref 3.5–5.1)
Sodium: 139 mmol/L (ref 135–145)
Total Bilirubin: 0.2 mg/dL — ABNORMAL LOW (ref 0.3–1.2)
Total Protein: 7.7 g/dL (ref 6.5–8.1)

## 2023-03-30 LAB — URINALYSIS, ROUTINE W REFLEX MICROSCOPIC
Bilirubin Urine: NEGATIVE
Glucose, UA: NEGATIVE mg/dL
Hgb urine dipstick: NEGATIVE
Ketones, ur: NEGATIVE mg/dL
Leukocytes,Ua: NEGATIVE
Nitrite: NEGATIVE
Protein, ur: NEGATIVE mg/dL
Specific Gravity, Urine: 1.013 (ref 1.005–1.030)
pH: 5.5 (ref 5.0–8.0)

## 2023-03-30 LAB — CBC
HCT: 34.7 % — ABNORMAL LOW (ref 36.0–46.0)
Hemoglobin: 10.9 g/dL — ABNORMAL LOW (ref 12.0–15.0)
MCH: 25.9 pg — ABNORMAL LOW (ref 26.0–34.0)
MCHC: 31.4 g/dL (ref 30.0–36.0)
MCV: 82.4 fL (ref 80.0–100.0)
Platelets: 268 10*3/uL (ref 150–400)
RBC: 4.21 MIL/uL (ref 3.87–5.11)
RDW: 14.4 % (ref 11.5–15.5)
WBC: 5.8 10*3/uL (ref 4.0–10.5)
nRBC: 0 % (ref 0.0–0.2)

## 2023-03-30 LAB — PREGNANCY, URINE: Preg Test, Ur: NEGATIVE

## 2023-03-30 LAB — LIPASE, BLOOD: Lipase: 15 U/L (ref 11–51)

## 2023-03-30 MED ORDER — ONDANSETRON HCL 4 MG/2ML IJ SOLN
4.0000 mg | Freq: Once | INTRAMUSCULAR | Status: AC
  Administered 2023-03-30: 4 mg via INTRAVENOUS
  Filled 2023-03-30: qty 2

## 2023-03-30 MED ORDER — IOHEXOL 300 MG/ML  SOLN
100.0000 mL | Freq: Once | INTRAMUSCULAR | Status: AC | PRN
  Administered 2023-03-30: 80 mL via INTRAVENOUS

## 2023-03-30 MED ORDER — METRONIDAZOLE 500 MG PO TABS: 500.0000 mg | ORAL_TABLET | Freq: Two times a day (BID) | ORAL | 0 refills | Status: AC

## 2023-03-30 MED ORDER — SODIUM CHLORIDE 0.9 % IV BOLUS
1000.0000 mL | Freq: Once | INTRAVENOUS | Status: AC
  Administered 2023-03-30: 1000 mL via INTRAVENOUS

## 2023-03-30 MED ORDER — MORPHINE SULFATE (PF) 4 MG/ML IV SOLN
4.0000 mg | Freq: Once | INTRAVENOUS | Status: AC
  Administered 2023-03-30: 4 mg via INTRAVENOUS
  Filled 2023-03-30: qty 1

## 2023-03-30 NOTE — ED Notes (Signed)
Pt discharged home and given discharge paperwork. Opportunities given for questions. Pt verbalizes understanding. PIV removed x1. Prescriptions reviewed.  Jillyn Hidden , RN

## 2023-03-30 NOTE — Discharge Instructions (Addendum)
You were evaluated in the Emergency Department and after careful evaluation, we did not find any emergent condition requiring admission or further testing in the hospital.  Your exam/testing today was overall reassuring.  You tested positive for bacterial vaginosis.  Take the metronidazole medication as directed  if you start having a smell or discharge.  Avoid alcohol while on this medication.  Please return to the Emergency Department if you experience any worsening of your condition.  Thank you for allowing [place] to be a part of your care.

## 2023-03-30 NOTE — ED Provider Notes (Signed)
Assumed care from Dr. Pilar Plate at 7 AM.  Pending CT due to abdominal pain.  I have independently visualized and interpreted pt's images today.  CT abdomen pelvis today without significant findings except for stool burden in the right lower quadrant.  Radiology reports the CT is negative.  On reevaluation patient is comfortable does not appear to be having significant discomfort at this time.  She does have a history of IBS and does see [facility].  Findings discussed with the patient she is stable for discharge home.  She will call her GI provider for follow-up.    Gwyneth Sprout, MD 03/30/23 [phone]

## 2023-03-30 NOTE — ED Provider Notes (Signed)
[facility] EMERGENCY [facility] Emergency Department Provider Note MRN:  [id number]  Arrival date & time: 03/30/23     Chief Complaint   Abdominal Pain and Vaginal Pain   History of Present Illness   Jacqueline Gonzalez is a 22 y.o. year-old female with a history of PCOS, IBS presenting to the ED with chief complaint of abdominal pain.  Suprapubic abdominal pain for the past week.  Associated with nausea, diarrhea.  Pain not going away.  Review of Systems  A thorough review of systems was obtained and all systems are negative except as noted in the HPI and PMH.   Patient's Health History    Past Medical History:  Diagnosis Date   Anemia    Anxiety    Arthritis    knees   Chlamydia 11/13/2021   Constipation, chronic    Depression    Dyspnea    ON OCC   Environmental allergies    Ganglion cyst of dorsum of left wrist    RECURRENT   GERD (gastroesophageal reflux disease)    watches diet   Headache MIGRAINE    History of hypertension    IN PAST, NO RECENT HTN PER PT 01-19-2023   IBS (irritable bowel syndrome)    Lactose intolerance    PCOS (polycystic ovarian syndrome)    Pre-diabetes     Past Surgical History:  Procedure Laterality Date   GANGLION CYST EXCISION Left 04/24/2021   Procedure: left recurrent dorsal carpal ganglion excision;  Surgeon: Gomez Cleverly, MD;  Location: [facility];  Service: Orthopedics;  Laterality: Left;   INCISION AND DRAINAGE PERIRECTAL ABSCESS N/A 03/14/2022   Procedure: IRRIGATION AND DEBRIDEMENT PERIRECTAL ABSCESS;  Surgeon: Karie Soda, MD;  Location: [facility];  Service: General;  Laterality: N/A;   LAPAROSCOPY N/A 02/10/2023   Procedure: LAPAROSCOPY DIAGNOSTIC;  Surgeon: [place] Bing, MD;  Location: [facility];  Service: Gynecology;  Laterality: N/A;   TONSILLECTOMY AND ADENOIDECTOMY     AGE 21   WISDOM TOOTH EXTRACTION  03/18/2021   WRIST GANGLION EXCISION Left 02/2020    Family History  Problem  Relation Age of Onset   Heart failure Mother    Migraines Mother    Irritable bowel syndrome Mother    Other Mother 46       Tricuspid valve regurgitation   Asthma Father    Hypertension Maternal Grandmother    Diabetes Maternal Grandmother    Hypertension Maternal Grandfather    Diabetes Maternal Grandfather    Hypertension Paternal Grandmother    Stroke Paternal Grandmother    Diabetes Paternal Grandmother    Hypertension Paternal Grandfather    Diabetes Paternal Grandfather    Kidney disease Paternal Grandfather    Ovarian cancer Maternal Aunt    Graves' disease Paternal Aunt    Stomach cancer Neg Hx    Esophageal cancer Neg Hx    Colon cancer Neg Hx     Social History   Socioeconomic History   Marital status: Single    Spouse name: Not on file   Number of children: 0   Years of education: Not on file   Highest education level: Not on file  Occupational History   Occupation: student  Tobacco Use   Smoking status: Never    Passive exposure: Past   Smokeless tobacco: Never  Vaping Use   Vaping status: Never Used  Substance and Sexual Activity   Alcohol use: Yes    Comment: occasionally   Drug use: Never  Comment: Smokes CBD   Sexual activity: Yes    Birth control/protection: OCP  Other Topics Concern   Not on file  Social History Narrative   Not on file   Social Determinants of Health   Financial Resource Strain: Not on file  Food Insecurity: Not on file  Transportation Needs: Not on file  Physical Activity: Not on file  Stress: No Stress Concern Present (07/04/2021)   [employer] of Occupational Health - Occupational Stress Questionnaire    Feeling of Stress : Only a little  Recent Concern: Stress - Stress Concern Present (06/20/2021)   [employer] of Occupational Health - Occupational Stress Questionnaire    Feeling of Stress : To some extent  Social Connections: Not on file  Intimate Partner Violence: Not on file     Physical Exam    Vitals:   03/30/23 0630 03/30/23 0645  BP: (!) 124/92 113/73  Pulse: 83 76  Resp:    Temp:    SpO2: 100% 100%    CONSTITUTIONAL: Well-appearing, NAD NEURO/PSYCH:  Alert and oriented x 3, no focal deficits EYES:  eyes equal and reactive ENT/NECK:  no LAD, no JVD CARDIO: Regular rate, well-perfused, normal S1 and S2 PULM:  CTAB no wheezing or rhonchi GI/GU:  non-distended, non-tender MSK/SPINE:  No gross deformities, no edema SKIN:  no rash, atraumatic   *Additional and/or pertinent findings included in MDM below  Diagnostic and Interventional Summary    EKG Interpretation Date/Time:    Ventricular Rate:    PR Interval:    QRS Duration:    QT Interval:    QTC Calculation:   R Axis:      Text Interpretation:         Labs Reviewed  CBC - Abnormal; Notable for the following components:      Result Value   Hemoglobin 10.9 (*)    HCT 34.7 (*)    MCH 25.9 (*)    All other components within normal limits  WET PREP, GENITAL  URINALYSIS, ROUTINE W REFLEX MICROSCOPIC  COMPREHENSIVE METABOLIC PANEL  LIPASE, BLOOD  GC/CHLAMYDIA PROBE AMP ([place]) NOT AT [facility]    CT ABDOMEN PELVIS W CONTRAST    (Results Pending)    Medications  sodium chloride 0.9 % bolus 1,000 mL (1,000 mLs Intravenous New Bag/Given 03/30/23 0624)  ondansetron (ZOFRAN) injection 4 mg (4 mg Intravenous Given 03/30/23 0626)  morphine (PF) 4 MG/ML injection 4 mg (4 mg Intravenous Given 03/30/23 5638)     Procedures  /  Critical Care Procedures  ED Course and Medical Decision Making  Initial Impression and Ddx Differential diagnosis includes colitis, UTI, diverticulitis, appendicitis.  Patient also has a history of rectal abscess, PID.  Past medical/surgical history that increases complexity of ED encounter: IBS  Interpretation of Diagnostics Labs and CT pending  Patient Reassessment and Ultimate Disposition/Management     Signed out to oncoming provider.  Patient management required  discussion with the following services or consulting groups:  None  Complexity of Problems Addressed Acute illness or injury that poses threat of life of bodily function  Additional Data Reviewed and Analyzed Further history obtained from: Further history from spouse/family member  Additional Factors Impacting ED Encounter Risk None  Elmer Sow. Pilar Plate, MD [facility] Emergency Medicine [facility] [email or web address]  Final Clinical Impressions(s) / ED Diagnoses     ICD-10-CM   1. Lower abdominal pain  R10.30       ED Discharge Orders  None        Discharge Instructions Discussed with and Provided to Patient:   Discharge Instructions   None      Sabas Sous, MD 03/30/23 [phone]

## 2023-03-30 NOTE — ED Triage Notes (Signed)
Pt states that she has been having lower abd pain and vaginal pain for the past week with dysuria, denies discharge.

## 2023-03-31 LAB — GC/CHLAMYDIA PROBE AMP (~~LOC~~) NOT AT ARMC
Chlamydia: NEGATIVE
Comment: NEGATIVE
Comment: NORMAL
Neisseria Gonorrhea: NEGATIVE

## 2023-04-12 ENCOUNTER — Encounter: Payer: Self-pay | Admitting: Internal Medicine

## 2023-04-12 ENCOUNTER — Ambulatory Visit: Payer: Medicaid Other | Attending: Internal Medicine | Admitting: Internal Medicine

## 2023-04-12 VITALS — BP 106/72 | HR 72 | Resp 14 | Ht 68.0 in | Wt 258.0 lb

## 2023-04-12 DIAGNOSIS — R768 Other specified abnormal immunological findings in serum: Secondary | ICD-10-CM | POA: Diagnosis not present

## 2023-04-12 DIAGNOSIS — Z79899 Other long term (current) drug therapy: Secondary | ICD-10-CM | POA: Insufficient documentation

## 2023-04-12 DIAGNOSIS — M1380 Other specified arthritis, unspecified site: Secondary | ICD-10-CM | POA: Insufficient documentation

## 2023-04-12 DIAGNOSIS — R700 Elevated erythrocyte sedimentation rate: Secondary | ICD-10-CM | POA: Insufficient documentation

## 2023-04-12 DIAGNOSIS — L304 Erythema intertrigo: Secondary | ICD-10-CM | POA: Insufficient documentation

## 2023-04-12 NOTE — Patient Instructions (Addendum)
I suspect the skin rash and itching around your breasts is related to intertrigo, which is due to trapped heat and moisture in that area. Treatment for this can include zinc oxide ointments such as Balmex or petroleum jelly such as Aquaphor. Sometimes it can get superimposed infection such as yeast rash in which case we could try a prescription antifungal medication.  For dry mouth you should stay well hydrated drinking plenty of water. To stimulate saliva production using sugar free gum or lozenges can help. Specific products include Biotene lozenges or Xylimelts.

## 2023-04-13 LAB — RNP ANTIBODY: Ribonucleic Protein(ENA) Antibody, IgG: 2.5 AI — AB

## 2023-04-13 LAB — C3 AND C4
C3 Complement: 191 mg/dL (ref 83–193)
C4 Complement: 35 mg/dL (ref 15–57)

## 2023-04-13 LAB — SEDIMENTATION RATE: Sed Rate: 33 mm/h — ABNORMAL HIGH (ref 0–20)

## 2023-04-15 ENCOUNTER — Emergency Department (HOSPITAL_BASED_OUTPATIENT_CLINIC_OR_DEPARTMENT_OTHER)
Admission: EM | Admit: 2023-04-15 | Discharge: 2023-04-15 | Disposition: A | Discharge status: Home / Self Care | Payer: Medicaid Other | Attending: Emergency Medicine | Admitting: Emergency Medicine

## 2023-04-15 ENCOUNTER — Emergency Department (HOSPITAL_BASED_OUTPATIENT_CLINIC_OR_DEPARTMENT_OTHER): Payer: Medicaid Other

## 2023-04-15 ENCOUNTER — Encounter (HOSPITAL_BASED_OUTPATIENT_CLINIC_OR_DEPARTMENT_OTHER): Payer: Self-pay

## 2023-04-15 ENCOUNTER — Other Ambulatory Visit: Payer: Self-pay

## 2023-04-15 DIAGNOSIS — Z1152 Encounter for screening for COVID-19: Secondary | ICD-10-CM | POA: Diagnosis not present

## 2023-04-15 DIAGNOSIS — R0600 Dyspnea, unspecified: Secondary | ICD-10-CM | POA: Diagnosis not present

## 2023-04-15 DIAGNOSIS — R0602 Shortness of breath: Secondary | ICD-10-CM | POA: Diagnosis present

## 2023-04-15 LAB — CBC
HCT: 32.6 % — ABNORMAL LOW (ref 36.0–46.0)
Hemoglobin: 10.4 g/dL — ABNORMAL LOW (ref 12.0–15.0)
MCH: 25.6 pg — ABNORMAL LOW (ref 26.0–34.0)
MCHC: 31.9 g/dL (ref 30.0–36.0)
MCV: 80.3 fL (ref 80.0–100.0)
Platelets: UNDETERMINED 10*3/uL (ref 150–400)
RBC: 4.06 MIL/uL (ref 3.87–5.11)
RDW: 14.1 % (ref 11.5–15.5)
WBC: 5.9 10*3/uL (ref 4.0–10.5)
nRBC: 0 % (ref 0.0–0.2)

## 2023-04-15 LAB — COMPREHENSIVE METABOLIC PANEL
ALT: 11 U/L (ref 0–44)
AST: 11 U/L — ABNORMAL LOW (ref 15–41)
Albumin: 3.9 g/dL (ref 3.5–5.0)
Alkaline Phosphatase: 71 U/L (ref 38–126)
Anion gap: 11 (ref 5–15)
BUN: 7 mg/dL (ref 6–20)
CO2: 21 mmol/L — ABNORMAL LOW (ref 22–32)
Calcium: 9.3 mg/dL (ref 8.9–10.3)
Chloride: 106 mmol/L (ref 98–111)
Creatinine, Ser: 0.76 mg/dL (ref 0.44–1.00)
GFR, Estimated: >60 mL/min (ref >60)
Glucose, Bld: 98 mg/dL (ref 70–99)
Potassium: 3.7 mmol/L (ref 3.5–5.1)
Sodium: 138 mmol/L (ref 135–145)
Total Bilirubin: 0.2 mg/dL — ABNORMAL LOW (ref 0.3–1.2)
Total Protein: 7.4 g/dL (ref 6.5–8.1)

## 2023-04-15 LAB — RESP PANEL BY RT-PCR (RSV, FLU A&B, COVID)  RVPGX2
Influenza A by PCR: NEGATIVE
Influenza B by PCR: NEGATIVE
Resp Syncytial Virus by PCR: NEGATIVE
SARS Coronavirus 2 by RT PCR: NEGATIVE

## 2023-04-15 MED ORDER — ALBUTEROL SULFATE (2.5 MG/3ML) 0.083% IN NEBU
2.5000 mg | INHALATION_SOLUTION | Freq: Once | RESPIRATORY_TRACT | Status: AC
  Administered 2023-04-15: 2.5 mg via RESPIRATORY_TRACT
  Filled 2023-04-15: qty 3

## 2023-04-15 NOTE — ED Notes (Signed)
Attempted IV stick x 2

## 2023-04-15 NOTE — ED Triage Notes (Signed)
Patient presents to ED c/o shortness of breath that started one hour PTA with bilateral leg "tingling" and headache. Patient reports she was lying down with symptoms started. Patient in NAD, airway intact, nonlabored and even respirations.

## 2023-04-15 NOTE — Discharge Instructions (Signed)
Use your inhaler as previously prescribed as needed for difficulty breathing.  Rest.  Follow-up with primary doctor if not improving in the next few days, and return to the ER if symptoms significantly worsen or change.

## 2023-04-15 NOTE — ED Notes (Signed)
ED Provider at bedside. 

## 2023-04-15 NOTE — ED Provider Notes (Signed)
[place] EMERGENCY DEPARTMENT AT [facility] Provider Note   CSN: [id number] Arrival date & time: 04/15/23  0455     History  Chief Complaint  Patient presents with   Shortness of Breath    Jacqueline Gonzalez is a 22 y.o. female.  Patient is a 22 year old female with PMH of PCOS, IBS, Arthralgias, Depression, Anxiety.  Patient presenting with complaints of shortness of breath.  She was up watching television when her symptoms began.  She difficulty breathing, feeling tightness in her throat, tingling in her legs, and headache.  She tried using her albuterol inhaler with little relief.  No fevers, chills, or cough.  No sick contacts.  No alleviating factors.  The history is provided by the patient.       Home Medications Prior to Admission medications   Medication Sig Start Date End Date Taking? Authorizing Provider  ADVAIR DISKUS 100-50 MCG/ACT AEPB Inhale 1 puff into the lungs 2 (two) times daily. 11/21/22   [provider]  albuterol (VENTOLIN HFA) 108 (90 Base) MCG/ACT inhaler Inhale into the lungs every 6 (six) hours as needed for wheezing or shortness of breath.    [provider]  buPROPion (WELLBUTRIN XL) 150 MG 24 hr tablet Take by mouth. 03/12/23   [provider]  cetirizine (ZYRTEC) 10 MG tablet Take 1 tablet (10 mg total) by mouth daily. 10/18/22   Redwine, Madison A, PA-C  dicyclomine (BENTYL) 20 MG tablet Take 1 tablet (20 mg total) by mouth 2 (two) times daily for 7 days. 01/02/23 02/10/23  Sloan Leiter, DO  DTx App - Gastrointestinal [facility] IBS) MISC Use as directed Patient not taking: Reported on 04/12/2023 11/25/22   Jenel Lucks, MD  [facility] 120 MG/ML SOAJ Inject into the skin. Patient not taking: Reported on 04/12/2023 01/02/23   [provider]  FAMOTIDINE PO Take by mouth daily.    [provider]  FLUoxetine (PROZAC) 20 MG capsule Take 60 mg by mouth every morning. 04/10/23   [provider]   FLUoxetine (PROZAC) 40 MG capsule Take 1 capsule (40 mg total) by mouth daily. Patient taking differently: Take 40 mg by mouth every morning. 12/31/21   Cora Collum, DO  fluticasone (FLONASE) 50 MCG/ACT nasal spray Place 2 sprays into both nostrils daily. 06/27/22   Claiborne Rigg, NP  gabapentin (NEURONTIN) 100 MG capsule Take 1 capsule (100 mg total) by mouth 3 (three) times daily. Patient not taking: Reported on 03/04/2023 02/17/23   Reva Bores, MD  Galcanezumab-gnlm [facility]) Inject 1 Dose into the skin every 30 (thirty) days. Patient not taking: Reported on 02/09/2023    [provider]  hydroxychloroquine (PLAQUENIL) 200 MG tablet Take 1 tablet (200 mg total) by mouth daily. Patient taking differently: Take 200 mg by mouth daily. Has not started yet 02/09/23   Fuller Plan, MD  ibuprofen (ADVIL) 600 MG tablet Take 1 tablet (600 mg total) by mouth every 6 (six) hours as needed. 02/10/23   [place] Bing, MD  medroxyPROGESTERone (PROVERA) 10 MG tablet 1 tab po qday x 14 days each month. You can expect a period to start after the finishing the 14 days. Patient not taking: Reported on 03/04/2023 08/18/22   [place] Bing, MD  oxyCODONE-acetaminophen (PERCOCET/ROXICET) 5-325 MG tablet Take 1 tablet by mouth every 6 (six) hours as needed. Patient not taking: Reported on 03/04/2023 02/17/23   Reva Bores, MD  pantoprazole (PROTONIX) 40 MG tablet Take 1  tablet (40 mg total) by mouth 2 (two) times daily before a meal. Patient taking differently: Take 40 mg by mouth as needed. 09/24/22   Unk Lightning, PA  pregabalin (LYRICA) 50 MG capsule Take 50 mg by mouth 2 (two) times daily. 03/24/23   [provider]  propranolol (INDERAL) 60 MG tablet Take 60 mg by mouth 2 (two) times daily. 03/30/23   [provider]  Psyllium (METAMUCIL) 48.57 % POWD 1 spoonful daily for the next 14 days Patient not taking: Reported on 03/04/2023 02/10/23   [place] Bing,  MD  tiZANidine (ZANAFLEX) 4 MG tablet Take 4 mg by mouth 2 (two) times daily as needed. 03/30/23   [provider]  Vitamin D, Ergocalciferol, (DRISDOL) 1.25 MG (50000 UNIT) CAPS capsule Take 50,000 Units by mouth every 7 (seven) days. Patient not taking: Reported on 03/04/2023    [provider]      Allergies    Blue dyes (parenteral), Blueberry [vaccinium angustifolium], Raspberry, Shellfish allergy, Amitriptyline, and Sumatriptan    Review of Systems   Review of Systems  All other systems reviewed and are negative.   Physical Exam Updated Vital Signs BP (!) 149/103 (BP Location: Right Arm)   Pulse 77   Temp 98 F (36.7 C) (Oral)   Resp (!) 22   Ht 5\' 8"  (1.727 m)   Wt 117.9 kg   LMP 09/18/2022   SpO2 100%   BMI 39.53 kg/m  Physical Exam Vitals and nursing note reviewed.  Constitutional:      General: She is not in acute distress.    Appearance: She is well-developed. She is not diaphoretic.  HENT:     Head: Normocephalic and atraumatic.  Cardiovascular:     Rate and Rhythm: Normal rate and regular rhythm.     Heart sounds: No murmur heard.    No friction rub. No gallop.  Pulmonary:     Effort: Pulmonary effort is normal. No respiratory distress.     Breath sounds: Normal breath sounds. No wheezing.  Abdominal:     General: Bowel sounds are normal. There is no distension.     Palpations: Abdomen is soft.     Tenderness: There is no abdominal tenderness.  Musculoskeletal:        General: Normal range of motion.     Cervical back: Normal range of motion and neck supple.     Right lower leg: No tenderness. No edema.     Left lower leg: No tenderness. No edema.  Skin:    General: Skin is warm and dry.  Neurological:     General: No focal deficit present.     Mental Status: She is alert and oriented to person, place, and time.     ED Results / Procedures / Treatments   Labs (all labs ordered are listed, but only abnormal results are  displayed) Labs Reviewed  RESP PANEL BY RT-PCR (RSV, FLU A&B, COVID)  [id number]  COMPREHENSIVE METABOLIC PANEL  CBC    EKG EKG Interpretation Date/Time:  Thursday April 15 2023 05:07:06 EDT Ventricular Rate:  81 PR Interval:  162 QRS Duration:  99 QT Interval:  373 QTC Calculation: 433 R Axis:   22  Text Interpretation: Sinus rhythm Normal ECG Confirmed by Geoffery Lyons ([id number]) on 04/15/2023 5:19:31 AM  Radiology No results found.  Procedures Procedures    Medications Ordered in ED Medications  albuterol (PROVENTIL) (2.5 MG/3ML) 0.083% nebulizer solution 2.5 mg (has no administration in time range)  ED Course/ Medical Decision Making/ A&P  Patient presenting with shortness of breath as described in the HPI.  Patient arrives here with stable vital signs and is afebrile.  Physical examination reveals clear lungs and patient to be in no respiratory distress.  Her oxygen saturations are 100%.  Workup initiated including CBC, metabolic panel, and COVID/flu/RSV, all of which are unremarkable.  Chest x-ray is clear.  Patient given an albuterol neb with some relief.  Patient has been reassessed and she continues with stable vital signs and oxygen saturation of 100%.  I highly doubt pulmonary embolism or other emergent pathology.  Her heart rate is in the 60s and has no risk factors for this.  There is no evidence for pneumonia or other infectious process.  Symptoms may well be anxiety related as she is describing some tingling in her legs.  Either way, patient is appropriate for discharge.  I highly doubt emergent pathology and feels the patient can go home.  She is to use her inhaler as needed and follow-up if not improving.  Final Clinical Impression(s) / ED Diagnoses Final diagnoses:  None    Rx / DC Orders ED Discharge Orders     None         Geoffery Lyons, MD 04/15/23 [phone]

## 2023-05-11 ENCOUNTER — Ambulatory Visit (HOSPITAL_COMMUNITY)
Admission: RE | Admit: 2023-05-11 | Discharge: 2023-05-11 | Disposition: A | Discharge status: Home / Self Care | Payer: Medicaid Other | Source: Ambulatory Visit | Attending: Vascular Surgery | Admitting: Vascular Surgery

## 2023-05-11 ENCOUNTER — Other Ambulatory Visit (HOSPITAL_COMMUNITY): Payer: Self-pay | Admitting: Family Medicine

## 2023-05-11 DIAGNOSIS — M79605 Pain in left leg: Secondary | ICD-10-CM

## 2023-06-02 ENCOUNTER — Telehealth: Payer: Medicaid Other | Admitting: Family Medicine

## 2023-06-02 DIAGNOSIS — R0602 Shortness of breath: Secondary | ICD-10-CM

## 2023-06-02 NOTE — Progress Notes (Signed)
Because chest and or abdominal discomfort and shortness of breath your condition warrants further evaluation and I recommend that you be seen in a face to face visit at a local urgent care.   NOTE: There will be NO CHARGE for this eVisit   If you are having a true medical emergency please call 911.

## 2023-06-07 ENCOUNTER — Ambulatory Visit: Payer: Medicaid Other | Admitting: Gastroenterology

## 2023-06-11 ENCOUNTER — Other Ambulatory Visit: Payer: Self-pay

## 2023-06-11 ENCOUNTER — Encounter (HOSPITAL_BASED_OUTPATIENT_CLINIC_OR_DEPARTMENT_OTHER): Payer: Self-pay

## 2023-06-11 ENCOUNTER — Emergency Department (HOSPITAL_BASED_OUTPATIENT_CLINIC_OR_DEPARTMENT_OTHER)
Admission: EM | Admit: 2023-06-11 | Discharge: 2023-06-11 | Disposition: A | Discharge status: Home / Self Care | Payer: Medicaid Other | Attending: Emergency Medicine | Admitting: Emergency Medicine

## 2023-06-11 DIAGNOSIS — M791 Myalgia, unspecified site: Secondary | ICD-10-CM | POA: Insufficient documentation

## 2023-06-11 DIAGNOSIS — J209 Acute bronchitis, unspecified: Secondary | ICD-10-CM | POA: Insufficient documentation

## 2023-06-11 DIAGNOSIS — R519 Headache, unspecified: Secondary | ICD-10-CM | POA: Diagnosis not present

## 2023-06-11 DIAGNOSIS — Z20822 Contact with and (suspected) exposure to covid-19: Secondary | ICD-10-CM | POA: Insufficient documentation

## 2023-06-11 DIAGNOSIS — R059 Cough, unspecified: Secondary | ICD-10-CM | POA: Diagnosis present

## 2023-06-11 DIAGNOSIS — I10 Essential (primary) hypertension: Secondary | ICD-10-CM | POA: Diagnosis not present

## 2023-06-11 DIAGNOSIS — J069 Acute upper respiratory infection, unspecified: Secondary | ICD-10-CM | POA: Insufficient documentation

## 2023-06-11 LAB — RESP PANEL BY RT-PCR (RSV, FLU A&B, COVID)  RVPGX2
Influenza A by PCR: NEGATIVE
Influenza B by PCR: NEGATIVE
Resp Syncytial Virus by PCR: NEGATIVE
SARS Coronavirus 2 by RT PCR: NEGATIVE

## 2023-06-11 MED ORDER — DEXAMETHASONE SODIUM PHOSPHATE 10 MG/ML IJ SOLN
10.0000 mg | Freq: Once | INTRAMUSCULAR | Status: AC
  Administered 2023-06-11: 10 mg via INTRAMUSCULAR
  Filled 2023-06-11: qty 1

## 2023-06-11 NOTE — ED Provider Notes (Signed)
[place] EMERGENCY DEPARTMENT AT [facility] Provider Note   CSN: [id number] Arrival date & time: 06/11/23  0025     History  Chief Complaint  Patient presents with   Cough    Jacqueline Gonzalez is a 22 y.o. female.  HPI     This is a 22 year old female who presents with upper respiratory symptoms.  Patient reports 1 week history of progressive cough, sore throat, body aches, shortness of breath, headache, myalgias.  Reports temperature up to 100.1 during this time.  Has been using over-the-counter medications with minimal relief.  No diagnosed history of asthma but does use an albuterol inhaler occasionally.  No known sick contacts.  Home Medications Prior to Admission medications   Medication Sig Start Date End Date Taking? Authorizing Provider  ADVAIR DISKUS 100-50 MCG/ACT AEPB Inhale 1 puff into the lungs 2 (two) times daily. 11/21/22   [provider]  albuterol (VENTOLIN HFA) 108 (90 Base) MCG/ACT inhaler Inhale into the lungs every 6 (six) hours as needed for wheezing or shortness of breath.    [provider]  buPROPion (WELLBUTRIN XL) 150 MG 24 hr tablet Take by mouth. 03/12/23   [provider]  cetirizine (ZYRTEC) 10 MG tablet Take 1 tablet (10 mg total) by mouth daily. 10/18/22   Redwine, Madison A, PA-C  dicyclomine (BENTYL) 20 MG tablet Take 1 tablet (20 mg total) by mouth 2 (two) times daily for 7 days. 01/02/23 02/10/23  Sloan Leiter, DO  DTx App - Gastrointestinal [facility] IBS) MISC Use as directed Patient not taking: Reported on 04/12/2023 11/25/22   Jenel Lucks, MD  [facility] 120 MG/ML SOAJ Inject into the skin. Patient not taking: Reported on 04/12/2023 01/02/23   [provider]  FAMOTIDINE PO Take by mouth daily.    [provider]  FLUoxetine (PROZAC) 20 MG capsule Take 60 mg by mouth every morning. 04/10/23   [provider]  FLUoxetine (PROZAC) 40 MG capsule Take 1 capsule (40 mg total) by mouth  daily. Patient taking differently: Take 40 mg by mouth every morning. 12/31/21   Cora Collum, DO  fluticasone (FLONASE) 50 MCG/ACT nasal spray Place 2 sprays into both nostrils daily. 06/27/22   Claiborne Rigg, NP  gabapentin (NEURONTIN) 100 MG capsule Take 1 capsule (100 mg total) by mouth 3 (three) times daily. Patient not taking: Reported on 03/04/2023 02/17/23   Reva Bores, MD  Galcanezumab-gnlm [facility]) Inject 1 Dose into the skin every 30 (thirty) days. Patient not taking: Reported on 02/09/2023    [provider]  hydroxychloroquine (PLAQUENIL) 200 MG tablet Take 1 tablet (200 mg total) by mouth daily. Patient taking differently: Take 200 mg by mouth daily. Has not started yet 02/09/23   Fuller Plan, MD  ibuprofen (ADVIL) 600 MG tablet Take 1 tablet (600 mg total) by mouth every 6 (six) hours as needed. 02/10/23   [place] Bing, MD  medroxyPROGESTERone (PROVERA) 10 MG tablet 1 tab po qday x 14 days each month. You can expect a period to start after the finishing the 14 days. Patient not taking: Reported on 03/04/2023 08/18/22   [place] Bing, MD  oxyCODONE-acetaminophen (PERCOCET/ROXICET) 5-325 MG tablet Take 1 tablet by mouth every 6 (six) hours as needed. Patient not taking: Reported on 03/04/2023 02/17/23   Reva Bores, MD  pantoprazole (PROTONIX) 40 MG tablet Take 1 tablet (40 mg total) by mouth 2 (two) times daily before a meal. Patient taking differently: Take 40  mg by mouth as needed. 09/24/22   Unk Lightning, PA  pregabalin (LYRICA) 50 MG capsule Take 50 mg by mouth 2 (two) times daily. 03/24/23   [provider]  propranolol (INDERAL) 60 MG tablet Take 60 mg by mouth 2 (two) times daily. 03/30/23   [provider]  Psyllium (METAMUCIL) 48.57 % POWD 1 spoonful daily for the next 14 days Patient not taking: Reported on 03/04/2023 02/10/23   [place] Bing, MD  tiZANidine (ZANAFLEX) 4 MG tablet Take 4 mg by mouth 2 (two) times  daily as needed. 03/30/23   [provider]  Vitamin D, Ergocalciferol, (DRISDOL) 1.25 MG (50000 UNIT) CAPS capsule Take 50,000 Units by mouth every 7 (seven) days. Patient not taking: Reported on 03/04/2023    [provider]      Allergies    Blue dyes (parenteral), Blueberry [vaccinium angustifolium], Raspberry, Shellfish allergy, Amitriptyline, and Sumatriptan    Review of Systems   Review of Systems  Constitutional:  Positive for fever.  HENT:  Positive for sore throat.   Respiratory:  Positive for cough and shortness of breath.   Cardiovascular:  Negative for chest pain.  Gastrointestinal:  Negative for nausea and vomiting.  All other systems reviewed and are negative.   Physical Exam Updated Vital Signs BP 128/83 (BP Location: Right Arm)   Pulse 97   Temp 98.5 F (36.9 C) (Oral)   Resp 18   Ht 1.727 m (5\' 8" )   Wt 113.4 kg   SpO2 99%   BMI 38.01 kg/m  Physical Exam Vitals and nursing note reviewed.  Constitutional:      Appearance: She is well-developed. She is obese. She is not ill-appearing.  HENT:     Head: Normocephalic and atraumatic.     Nose: Congestion present.     Mouth/Throat:     Mouth: Mucous membranes are moist.     Pharynx: No oropharyngeal exudate or posterior oropharyngeal erythema.  Eyes:     Pupils: Pupils are equal, round, and reactive to light.  Cardiovascular:     Rate and Rhythm: Normal rate and regular rhythm.     Heart sounds: Normal heart sounds.  Pulmonary:     Effort: Pulmonary effort is normal. No respiratory distress.     Breath sounds: No wheezing.     Comments: Occasional wheeze noted with coughing Abdominal:     General: Bowel sounds are normal.     Palpations: Abdomen is soft.  Musculoskeletal:     Cervical back: Neck supple.  Skin:    General: Skin is warm and dry.  Neurological:     Mental Status: She is alert and oriented to person, place, and time.  Psychiatric:        Mood and Affect: Mood normal.      ED Results / Procedures / Treatments   Labs (all labs ordered are listed, but only abnormal results are displayed) Labs Reviewed  RESP PANEL BY RT-PCR (RSV, FLU A&B, COVID)  RVPGX2    EKG None  Radiology No results found.  Procedures Procedures    Medications Ordered in ED Medications  dexamethasone (DECADRON) injection 10 mg (10 mg Intramuscular Given 06/11/23 0048)    ED Course/ Medical Decision Making/ A&P                                 Medical Decision Making Risk Prescription drug management.   This patient presents  to the ED for concern of upper respiratory symptoms, this involves an extensive number of treatment options, and is a complaint that carries with it a high risk of complications and morbidity.  I considered the following differential and admission for this acute, potentially life threatening condition.  The differential diagnosis includes viral infection such as COVID or influenza, acute bronchitis, pneumonia  MDM:    This is a 22 year old female who presents with upper respiratory symptoms.  She is overall nontoxic and vital signs are reassuring.  She is afebrile here.  She is in no respiratory distress.  She has a very occasional wheeze mostly with coughing.  Given history of albuterol use, will give a dose of Decadron for mild bronchitis for any bronchial inflammation.  COVID and influenza testing are negative.  Given that she is in no respiratory distress and otherwise has a reassuring pulmonary exam, have lower suspicion for pneumonia.  Constellation of symptoms most suggestive of viral illness.  Recommend supportive measures.  Use her an albuterol inhaler as needed at home.  (Labs, imaging, consults)  Labs: I Ordered, and personally interpreted labs.  The pertinent results include: COVID and influenza  Imaging Studies ordered: I ordered imaging studies including none I independently visualized and interpreted imaging. I agree with the  radiologist interpretation  Additional history obtained from chart review.  External records from outside source obtained and reviewed including prior evaluations  Cardiac Monitoring: The patient was maintained on a cardiac monitor.  If on the cardiac monitor, I personally viewed and interpreted the cardiac monitored which showed an underlying rhythm of: Sinus  Reevaluation: After the interventions noted above, I reevaluated the patient and found that they have :stayed the same  Social Determinants of Health:  lives independently  Disposition: Discharge  Co morbidities that complicate the patient evaluation  Past Medical History:  Diagnosis Date   Anemia    Anxiety    Arthritis    knees   Chlamydia 11/13/2021   Constipation, chronic    Depression    Dyspnea    ON OCC   Environmental allergies    Ganglion cyst of dorsum of left wrist    RECURRENT   GERD (gastroesophageal reflux disease)    watches diet   Headache MIGRAINE    History of hypertension    IN PAST, NO RECENT HTN PER PT 01-19-2023   IBS (irritable bowel syndrome)    Lactose intolerance    PCOS (polycystic ovarian syndrome)    Pre-diabetes      Medicines Meds ordered this encounter  Medications   dexamethasone (DECADRON) injection 10 mg    I have reviewed the patients home medicines and have made adjustments as needed  Problem List / ED Course: Problem List Items Addressed This Visit   None Visit Diagnoses     Viral URI    -  Primary   Acute bronchitis, unspecified organism                       Final Clinical Impression(s) / ED Diagnoses Final diagnoses:  Viral URI  Acute bronchitis, unspecified organism    Rx / DC Orders ED Discharge Orders     None         Shon Baton, MD 06/11/23 [phone]

## 2023-06-11 NOTE — ED Triage Notes (Signed)
Complaining of cough, sorethroat, body aches, shortness of breath, headache, and abdominal aches for over a week, but got worse this past weekend.  Has not been tested for covid

## 2023-06-11 NOTE — Discharge Instructions (Signed)
You were seen today for upper respiratory symptoms.  You likely have a viral infection.  Your COVID and flu testing was negative.  You may have some mild bronchitis.  You are given a steroid.  Use your inhaler as needed.  Use over-the-counter medications for symptom control.

## 2023-06-14 ENCOUNTER — Other Ambulatory Visit: Payer: Self-pay

## 2023-06-14 ENCOUNTER — Emergency Department (HOSPITAL_BASED_OUTPATIENT_CLINIC_OR_DEPARTMENT_OTHER): Payer: Medicaid Other | Admitting: Radiology

## 2023-06-14 ENCOUNTER — Emergency Department (HOSPITAL_BASED_OUTPATIENT_CLINIC_OR_DEPARTMENT_OTHER)
Admission: EM | Admit: 2023-06-14 | Discharge: 2023-06-14 | Disposition: A | Discharge status: Home / Self Care | Payer: Medicaid Other | Attending: Emergency Medicine | Admitting: Emergency Medicine

## 2023-06-14 ENCOUNTER — Encounter (HOSPITAL_BASED_OUTPATIENT_CLINIC_OR_DEPARTMENT_OTHER): Payer: Self-pay | Admitting: Emergency Medicine

## 2023-06-14 DIAGNOSIS — I10 Essential (primary) hypertension: Secondary | ICD-10-CM | POA: Diagnosis not present

## 2023-06-14 DIAGNOSIS — J040 Acute laryngitis: Secondary | ICD-10-CM | POA: Insufficient documentation

## 2023-06-14 DIAGNOSIS — J209 Acute bronchitis, unspecified: Secondary | ICD-10-CM | POA: Diagnosis not present

## 2023-06-14 DIAGNOSIS — Z1152 Encounter for screening for COVID-19: Secondary | ICD-10-CM | POA: Insufficient documentation

## 2023-06-14 DIAGNOSIS — R0602 Shortness of breath: Secondary | ICD-10-CM | POA: Diagnosis present

## 2023-06-14 LAB — RESP PANEL BY RT-PCR (RSV, FLU A&B, COVID)  RVPGX2
Influenza A by PCR: NEGATIVE
Influenza B by PCR: NEGATIVE
Resp Syncytial Virus by PCR: NEGATIVE
SARS Coronavirus 2 by RT PCR: NEGATIVE

## 2023-06-14 LAB — GROUP A STREP BY PCR: Group A Strep by PCR: NOT DETECTED

## 2023-06-14 MED ORDER — PREDNISONE 50 MG PO TABS
50.0000 mg | ORAL_TABLET | Freq: Every day | ORAL | 0 refills | Status: AC
Start: 2023-06-14 — End: 2023-06-19

## 2023-06-14 MED ORDER — ALBUTEROL SULFATE HFA 108 (90 BASE) MCG/ACT IN AERS
2.0000 | INHALATION_SPRAY | Freq: Once | RESPIRATORY_TRACT | Status: DC
  Filled 2023-06-14: qty 6.7

## 2023-06-14 MED ORDER — IPRATROPIUM-ALBUTEROL 0.5-2.5 (3) MG/3ML IN SOLN
3.0000 mL | Freq: Once | RESPIRATORY_TRACT | Status: AC
  Administered 2023-06-14: 3 mL via RESPIRATORY_TRACT
  Filled 2023-06-14: qty 3

## 2023-06-14 MED ORDER — PREDNISONE 50 MG PO TABS
60.0000 mg | ORAL_TABLET | Freq: Once | ORAL | Status: AC
  Administered 2023-06-14: 60 mg via ORAL
  Filled 2023-06-14: qty 1

## 2023-06-14 NOTE — ED Triage Notes (Signed)
Pt c/o cough, shob and sore throat. Referred by PCP d/t no improvement after recent tx. Pt endorses being hoarse

## 2023-06-14 NOTE — Discharge Instructions (Addendum)
Thank you for allowing [place] to be a part of your care today.  You tested negative for Covid, flu, RSV and strep throat.  I suspect your symptoms are viral and will take time to improve.   Bronchitis and the associated cough can take 3-4 weeks before it is completely resolved.  I am sending you home on 5 days of prednisone to take to help with inflammation of your airways and your sore throat.  START TAKING THIS PRESCRIPTION TOMORROW.  Do not take ibuprofen or naproxen while taking this medication.  You may take [phone] mg of Tylenol every 6 hours as needed for pain, fever, etc.   I encourage you to use throat lozenges, honey, hot tea with honey and/or lemon, ice pops, etc to soothe your sore throat.  Rest your voice as much as possible and do not whisper as this strains your vocal cords even further.   To help with cough, I recommend taking Delsym or Robitussin (active ingredient dextromethorphan).  This is a cough suppressant.  It is especially helpful at night time so you can get rest.  Avoid combination medications that contain guaifenesin.    Use 2 puffs of you albuterol inhaler every 4 hours as needed for wheezing, chest tightness, or shortness of breath.   Return to the ED if you develop sudden worsening of your symptoms or if you have new concerns.

## 2023-06-14 NOTE — ED Provider Notes (Signed)
[place] EMERGENCY DEPARTMENT AT [facility] Provider Note   CSN: [id number] Arrival date & time: 06/14/23  1221     History  Chief Complaint  Patient presents with   Sore Throat    Jacqueline Gonzalez is a 22 y.o. female with past medical history significant for anxiety, depression, GERD,  hypertension presents to the ED complaining of cough, shortness of breath, and sore throat.  Patient reports she has had these symptoms for over a week.  She was seen in the ED a few days ago, but states over the weekend, she developed worsening of her symptoms and lost her voice.  She has blood streaked mucus when she sneezes or coughs.  Patient was sent by her PCP due to no improvement in symptoms after recent treatment.  She has been taking Dayquil and Theraflu for her symptoms without much relief.  She also uses Advair and an albuterol inhaler, but is not officially diagnosed with asthma.  Denies recent fever, otalgia, trouble swallowing, chest pain, abdominal pain, nausea, vomiting, diarrhea.        Home Medications Prior to Admission medications   Medication Sig Start Date End Date Taking? Authorizing Provider  predniSONE (DELTASONE) 50 MG tablet Take 1 tablet (50 mg total) by mouth daily for 5 days. 06/14/23 06/19/23 Yes Gaylon Bentz R, PA-C  ADVAIR DISKUS 100-50 MCG/ACT AEPB Inhale 1 puff into the lungs 2 (two) times daily. 11/21/22   [provider]  albuterol (VENTOLIN HFA) 108 (90 Base) MCG/ACT inhaler Inhale into the lungs every 6 (six) hours as needed for wheezing or shortness of breath.    [provider]  buPROPion (WELLBUTRIN XL) 150 MG 24 hr tablet Take by mouth. 03/12/23   [provider]  cetirizine (ZYRTEC) 10 MG tablet Take 1 tablet (10 mg total) by mouth daily. 10/18/22   Redwine, Madison A, PA-C  dicyclomine (BENTYL) 20 MG tablet Take 1 tablet (20 mg total) by mouth 2 (two) times daily for 7 days. 01/02/23 02/10/23  Sloan Leiter, DO  DTx App -  Gastrointestinal [facility] IBS) MISC Use as directed Patient not taking: Reported on 04/12/2023 11/25/22   Jenel Lucks, MD  [facility] 120 MG/ML SOAJ Inject into the skin. Patient not taking: Reported on 04/12/2023 01/02/23   [provider]  FAMOTIDINE PO Take by mouth daily.    [provider]  FLUoxetine (PROZAC) 20 MG capsule Take 60 mg by mouth every morning. 04/10/23   [provider]  FLUoxetine (PROZAC) 40 MG capsule Take 1 capsule (40 mg total) by mouth daily. Patient taking differently: Take 40 mg by mouth every morning. 12/31/21   Cora Collum, DO  fluticasone (FLONASE) 50 MCG/ACT nasal spray Place 2 sprays into both nostrils daily. 06/27/22   Claiborne Rigg, NP  gabapentin (NEURONTIN) 100 MG capsule Take 1 capsule (100 mg total) by mouth 3 (three) times daily. Patient not taking: Reported on 03/04/2023 02/17/23   Reva Bores, MD  Galcanezumab-gnlm [facility]) Inject 1 Dose into the skin every 30 (thirty) days. Patient not taking: Reported on 02/09/2023    [provider]  hydroxychloroquine (PLAQUENIL) 200 MG tablet Take 1 tablet (200 mg total) by mouth daily. Patient taking differently: Take 200 mg by mouth daily. Has not started yet 02/09/23   Fuller Plan, MD  ibuprofen (ADVIL) 600 MG tablet Take 1 tablet (600 mg total) by mouth every 6 (six) hours as needed. 02/10/23   [place] Bing, MD  medroxyPROGESTERone (  PROVERA) 10 MG tablet 1 tab po qday x 14 days each month. You can expect a period to start after the finishing the 14 days. Patient not taking: Reported on 03/04/2023 08/18/22   [place] Bing, MD  oxyCODONE-acetaminophen (PERCOCET/ROXICET) 5-325 MG tablet Take 1 tablet by mouth every 6 (six) hours as needed. Patient not taking: Reported on 03/04/2023 02/17/23   Reva Bores, MD  pantoprazole (PROTONIX) 40 MG tablet Take 1 tablet (40 mg total) by mouth 2 (two) times daily before a meal. Patient taking differently: Take 40 mg by  mouth as needed. 09/24/22   Unk Lightning, PA  pregabalin (LYRICA) 50 MG capsule Take 50 mg by mouth 2 (two) times daily. 03/24/23   [provider]  propranolol (INDERAL) 60 MG tablet Take 60 mg by mouth 2 (two) times daily. 03/30/23   [provider]  Psyllium (METAMUCIL) 48.57 % POWD 1 spoonful daily for the next 14 days Patient not taking: Reported on 03/04/2023 02/10/23   [place] Bing, MD  tiZANidine (ZANAFLEX) 4 MG tablet Take 4 mg by mouth 2 (two) times daily as needed. 03/30/23   [provider]  Vitamin D, Ergocalciferol, (DRISDOL) 1.25 MG (50000 UNIT) CAPS capsule Take 50,000 Units by mouth every 7 (seven) days. Patient not taking: Reported on 03/04/2023    [provider]      Allergies    Blue dyes (parenteral), Blueberry [vaccinium angustifolium], Raspberry, Shellfish allergy, Amitriptyline, and Sumatriptan    Review of Systems   Review of Systems  Constitutional:  Positive for fatigue. Negative for fever.  HENT:  Positive for congestion, sore throat and voice change (hoarse). Negative for ear pain and trouble swallowing.   Respiratory:  Positive for cough, chest tightness and shortness of breath.   Cardiovascular:  Negative for chest pain.  Gastrointestinal:  Negative for abdominal pain, diarrhea, nausea and vomiting.    Physical Exam Updated Vital Signs BP 116/73 (BP Location: Left Arm)   Pulse 65   Temp 98.3 F (36.8 C) (Oral)   Resp 18   Wt 117.9 kg   SpO2 99%   BMI 39.53 kg/m  Physical Exam Vitals and nursing note reviewed.  Constitutional:      General: She is not in acute distress.    Appearance: Normal appearance. She is ill-appearing. She is not diaphoretic.  HENT:     Right Ear: Tympanic membrane and ear canal normal.     Left Ear: Tympanic membrane and ear canal normal.     Nose: Congestion present. No rhinorrhea.     Mouth/Throat:     Lips: Pink.     Mouth: Mucous membranes are moist.     Pharynx: Oropharynx  is clear. Posterior oropharyngeal erythema and postnasal drip present. No pharyngeal swelling, oropharyngeal exudate or uvula swelling.     Comments: Tonsils are surgically absent. Cardiovascular:     Rate and Rhythm: Normal rate and regular rhythm.     Heart sounds: Normal heart sounds.  Pulmonary:     Effort: Pulmonary effort is normal. No tachypnea.     Breath sounds: Normal air entry. No decreased air movement. Wheezing present. No decreased breath sounds.     Comments: Dry cough with wheeze appreciated. Skin:    General: Skin is warm and dry.     Capillary Refill: Capillary refill takes less than 2 seconds.  Neurological:     Mental Status: She is alert. Mental status is at baseline.  Psychiatric:  Mood and Affect: Mood normal.        Behavior: Behavior normal.     ED Results / Procedures / Treatments   Labs (all labs ordered are listed, but only abnormal results are displayed) Labs Reviewed  GROUP A STREP BY PCR  RESP PANEL BY RT-PCR (RSV, FLU A&B, COVID)  RVPGX2    EKG None  Radiology DG Chest 2 View  Result Date: 06/14/2023 CLINICAL DATA:  Productive cough, shortness of breath. EXAM: CHEST - 2 VIEW COMPARISON:  April 15, 2023. FINDINGS: The heart size and mediastinal contours are within normal limits. Both lungs are clear. The visualized skeletal structures are unremarkable. IMPRESSION: No active cardiopulmonary disease. Electronically Signed   By: Lupita Raider M.D.   On: 06/14/2023 13:45    Procedures Procedures    Medications Ordered in ED Medications  albuterol (VENTOLIN HFA) 108 (90 Base) MCG/ACT inhaler 2 puff (has no administration in time range)  predniSONE (DELTASONE) tablet 60 mg (60 mg Oral Given 06/14/23 1422)  ipratropium-albuterol (DUONEB) 0.5-2.5 (3) MG/3ML nebulizer solution 3 mL (3 mLs Nebulization Given 06/14/23 1501)    ED Course/ Medical Decision Making/ A&P                                 Medical Decision Making Amount  and/or Complexity of Data Reviewed Radiology: ordered.   This patient presents to the ED with chief complaint(s) of URI with cough symptoms, shortness of breath with non-contributory past medical history.  The complaint involves an extensive differential diagnosis and also carries with it a high risk of complications and morbidity.    The differential diagnosis includes bronchitis, acute asthma exacerbation, viral syndrome, pneumonia   The initial plan is to obtain respiratory swabs and chest x-ray (ordered during triage)  Initial Assessment:   Exam significant for ill-appearing patient who is not in acute respiratory distress.  Dry cough with wheezing appreciated.  Tidal volume is adequate.  Patient has coughing fits with deep breathing.  Heart rate is normal in the 70s.  Skin warm and dry.  Posterior oropharynx is erythematous with post-nasal drip.  Tonsils are surgically absent.  Voice is hoarse.  Patient able to swallow and is managing her own secretions.   Independent ECG/labs interpretation:  The following labs were independently interpreted:  Negative for Covid, flu, RSV and strep pharyngitis.   Independent visualization and interpretation of imaging: I independently visualized the following imaging with scope of interpretation limited to determining acute life threatening conditions related to emergency care: chest x-ray, which revealed no acute cardiopulmonary process, specifically, no pneumonia.  Treatment and Reassessment: Will give patient 60 mg prednisone and breathing treatment and reassess.    Patient had improvement in lung sounds.  She reports feeling like she can breathe better.  She is sitting upright and eating crackers without issue.   Disposition:   Suspect patient's symptoms are related to acute viral bronchitis that has exacerbated an underlying asthma condition.  Will treat with 5 days of prednisone.  Discussed with patient that bronchitis cough can last 3-4 weeks.   Patient also given albuterol inhaler to use every 4 hours as needed for chest tightness, shortness of breath, and/or wheezing.  Discussed over the counter medications she can take to help alleviate other symptoms.   The patient has been appropriately medically screened and/or stabilized in the ED. I have low suspicion for any other emergent medical condition which would  require further screening, evaluation or treatment in the ED or require inpatient management. At time of discharge the patient is hemodynamically stable and in no acute distress. I have discussed work-up results and diagnosis with patient and answered all questions. Patient is agreeable with discharge plan. We discussed strict return precautions for returning to the emergency department and they verbalized understanding.             Final Clinical Impression(s) / ED Diagnoses Final diagnoses:  Acute bronchitis, unspecified organism  Laryngitis    Rx / DC Orders ED Discharge Orders          Ordered    predniSONE (DELTASONE) 50 MG tablet  Daily        06/14/23 [address] [place] 06/14/23 1520    Gloris Manchester, MD 06/14/23 1556

## 2023-06-14 NOTE — ED Notes (Signed)
Pt alert and oriented X 4 at the time of discharge. RR even and unlabored. No acute distress noted. Pt verbalized understanding of discharge instructions as discussed. Pt ambulatory to lobby at time of discharge.

## 2023-06-26 ENCOUNTER — Other Ambulatory Visit: Payer: Self-pay

## 2023-06-26 ENCOUNTER — Encounter (HOSPITAL_BASED_OUTPATIENT_CLINIC_OR_DEPARTMENT_OTHER): Payer: Self-pay

## 2023-06-26 ENCOUNTER — Emergency Department (HOSPITAL_BASED_OUTPATIENT_CLINIC_OR_DEPARTMENT_OTHER)
Admission: EM | Admit: 2023-06-26 | Discharge: 2023-06-26 | Disposition: A | Discharge status: Home / Self Care | Payer: Medicaid Other | Attending: Emergency Medicine | Admitting: Emergency Medicine

## 2023-06-26 DIAGNOSIS — K625 Hemorrhage of anus and rectum: Secondary | ICD-10-CM | POA: Insufficient documentation

## 2023-06-26 DIAGNOSIS — K0889 Other specified disorders of teeth and supporting structures: Secondary | ICD-10-CM | POA: Insufficient documentation

## 2023-06-26 DIAGNOSIS — R197 Diarrhea, unspecified: Secondary | ICD-10-CM | POA: Diagnosis not present

## 2023-06-26 DIAGNOSIS — R220 Localized swelling, mass and lump, head: Secondary | ICD-10-CM | POA: Diagnosis present

## 2023-06-26 LAB — BASIC METABOLIC PANEL
Anion gap: 5 (ref 5–15)
BUN: 9 mg/dL (ref 6–20)
CO2: 27 mmol/L (ref 22–32)
Calcium: 8.9 mg/dL (ref 8.9–10.3)
Chloride: 105 mmol/L (ref 98–111)
Creatinine, Ser: 0.8 mg/dL (ref 0.44–1.00)
GFR, Estimated: >60 mL/min (ref >60)
Glucose, Bld: 98 mg/dL (ref 70–99)
Potassium: 4.2 mmol/L (ref 3.5–5.1)
Sodium: 137 mmol/L (ref 135–145)

## 2023-06-26 LAB — CBC WITH DIFFERENTIAL/PLATELET
Abs Immature Granulocytes: 0.01 10*3/uL (ref 0.00–0.07)
Basophils Absolute: 0 10*3/uL (ref 0.0–0.1)
Basophils Relative: 0 %
Eosinophils Absolute: 0.1 10*3/uL (ref 0.0–0.5)
Eosinophils Relative: 2 %
HCT: 34.8 % — ABNORMAL LOW (ref 36.0–46.0)
Hemoglobin: 10.7 g/dL — ABNORMAL LOW (ref 12.0–15.0)
Immature Granulocytes: 0 %
Lymphocytes Relative: 45 %
Lymphs Abs: 2.8 10*3/uL (ref 0.7–4.0)
MCH: 26.3 pg (ref 26.0–34.0)
MCHC: 30.7 g/dL (ref 30.0–36.0)
MCV: 85.5 fL (ref 80.0–100.0)
Monocytes Absolute: 0.5 10*3/uL (ref 0.1–1.0)
Monocytes Relative: 9 %
Neutro Abs: 2.7 10*3/uL (ref 1.7–7.7)
Neutrophils Relative %: 44 %
Platelets: 237 10*3/uL (ref 150–400)
RBC: 4.07 MIL/uL (ref 3.87–5.11)
RDW: 14.5 % (ref 11.5–15.5)
WBC: 6.2 10*3/uL (ref 4.0–10.5)
nRBC: 0 % (ref 0.0–0.2)

## 2023-06-26 LAB — OCCULT BLOOD X 1 CARD TO LAB, STOOL: Fecal Occult Bld: NEGATIVE

## 2023-06-26 MED ORDER — ALBUTEROL SULFATE HFA 108 (90 BASE) MCG/ACT IN AERS
1.0000 | INHALATION_SPRAY | Freq: Once | RESPIRATORY_TRACT | Status: AC
  Administered 2023-06-26: 2 via RESPIRATORY_TRACT
  Filled 2023-06-26: qty 6.7

## 2023-06-26 MED ORDER — OXYCODONE-ACETAMINOPHEN 5-325 MG PO TABS
1.0000 | ORAL_TABLET | Freq: Once | ORAL | Status: AC
  Administered 2023-06-26: 1 via ORAL
  Filled 2023-06-26: qty 1

## 2023-06-26 MED ORDER — NAPROXEN 375 MG PO TABS
375.0000 mg | ORAL_TABLET | Freq: Three times a day (TID) | ORAL | 0 refills | Status: AC
Start: 2023-06-26 — End: 2023-07-01

## 2023-06-26 MED ORDER — KETOROLAC TROMETHAMINE 30 MG/ML IJ SOLN
30.0000 mg | Freq: Once | INTRAMUSCULAR | Status: AC
  Administered 2023-06-26: 30 mg via INTRAVENOUS
  Filled 2023-06-26: qty 1

## 2023-06-26 NOTE — ED Triage Notes (Signed)
Patient arrives with complaints of facial swelling and worsening pain on the left side related to dental issues. Patient is currently on amoxicillin 4x a day and now having diarrhea.   No pain relief with Gabapentin, baclofen, tylenol/ibuprofen  Also reports bright red rectal bleeding.

## 2023-06-26 NOTE — Discharge Instructions (Addendum)
Today you were seen for dental pain.  Please pick up your medication and take as directed for pain.  Please continue taking your amoxicillin antibiotic.  Thank you for letting us treat you today. After reviewing your labs and imaging, I feel you are safe to go home. Please follow up with your PCP in the next several days and provide them with your records from this visit. Return to the Emergency Room if pain becomes severe or symptoms worsen.

## 2023-06-26 NOTE — ED Provider Notes (Signed)
EMERGENCY DEPARTMENT AT [facility] Provider Note   CSN: [id number] Arrival date & time: 06/26/23  7322     History  Chief Complaint  Patient presents with   Facial Swelling   Rectal Bleeding    Jacqueline Gonzalez is a 22 y.o. female past medical history significant for rectal abscess, anal fissure, rectal bleeding, IBS presents today for left facial swelling and pain related to dental issues.  Patient also complains of diarrhea and bright red blood per rectum.  Patient is currently on amoxicillin 4 times a day.  Patient has also tried gabapentin, baclofen, Tylenol/ibuprofen without pain relief.  Patient denies fever, chills, abdominal pain, body aches, vision changes, shortness of breath, or chest pain.  Patient states she has an appointment with her dentist on Tuesday to have the tooth removed.   Rectal Bleeding      Home Medications Prior to Admission medications   Medication Sig Start Date End Date Taking? Authorizing Provider  naproxen (NAPROSYN) 375 MG tablet Take 1 tablet (375 mg total) by mouth 3 (three) times daily with meals for 5 days. 06/26/23 07/01/23 Yes Dolphus Jenny, PA-C  ADVAIR DISKUS 100-50 MCG/ACT AEPB Inhale 1 puff into the lungs 2 (two) times daily. 11/21/22   [provider]  albuterol (VENTOLIN HFA) 108 (90 Base) MCG/ACT inhaler Inhale into the lungs every 6 (six) hours as needed for wheezing or shortness of breath.    [provider]  buPROPion (WELLBUTRIN XL) 150 MG 24 hr tablet Take by mouth. 03/12/23   [provider]  cetirizine (ZYRTEC) 10 MG tablet Take 1 tablet (10 mg total) by mouth daily. 10/18/22   Redwine, Madison A, PA-C  dicyclomine (BENTYL) 20 MG tablet Take 1 tablet (20 mg total) by mouth 2 (two) times daily for 7 days. 01/02/23 02/10/23  Sloan Leiter, DO  DTx App - Gastrointestinal [facility] IBS) MISC Use as directed Patient not taking: Reported on 04/12/2023 11/25/22   Jenel Lucks, MD  [facility]  120 MG/ML SOAJ Inject into the skin. Patient not taking: Reported on 04/12/2023 01/02/23   [provider]  FAMOTIDINE PO Take by mouth daily.    [provider]  FLUoxetine (PROZAC) 20 MG capsule Take 60 mg by mouth every morning. 04/10/23   [provider]  FLUoxetine (PROZAC) 40 MG capsule Take 1 capsule (40 mg total) by mouth daily. Patient taking differently: Take 40 mg by mouth every morning. 12/31/21   Cora Collum, DO  fluticasone (FLONASE) 50 MCG/ACT nasal spray Place 2 sprays into both nostrils daily. 06/27/22   Claiborne Rigg, NP  gabapentin (NEURONTIN) 100 MG capsule Take 1 capsule (100 mg total) by mouth 3 (three) times daily. Patient not taking: Reported on 03/04/2023 02/17/23   Reva Bores, MD  Galcanezumab-gnlm [facility]) Inject 1 Dose into the skin every 30 (thirty) days. Patient not taking: Reported on 02/09/2023    [provider]  hydroxychloroquine (PLAQUENIL) 200 MG tablet Take 1 tablet (200 mg total) by mouth daily. Patient taking differently: Take 200 mg by mouth daily. Has not started yet 02/09/23   Fuller Plan, MD  ibuprofen (ADVIL) 600 MG tablet Take 1 tablet (600 mg total) by mouth every 6 (six) hours as needed. 02/10/23   Callao Bing, MD  medroxyPROGESTERone (PROVERA) 10 MG tablet 1 tab po qday x 14 days each month. You can expect a period to start after the finishing the 14 days. Patient not taking: Reported on  03/04/2023 08/18/22   [place] Bing, MD  oxyCODONE-acetaminophen (PERCOCET/ROXICET) 5-325 MG tablet Take 1 tablet by mouth every 6 (six) hours as needed. Patient not taking: Reported on 03/04/2023 02/17/23   Reva Bores, MD  pantoprazole (PROTONIX) 40 MG tablet Take 1 tablet (40 mg total) by mouth 2 (two) times daily before a meal. Patient taking differently: Take 40 mg by mouth as needed. 09/24/22   Unk Lightning, PA  pregabalin (LYRICA) 50 MG capsule Take 50 mg by mouth 2 (two) times daily. 03/24/23    [provider]  propranolol (INDERAL) 60 MG tablet Take 60 mg by mouth 2 (two) times daily. 03/30/23   [provider]  Psyllium (METAMUCIL) 48.57 % POWD 1 spoonful daily for the next 14 days Patient not taking: Reported on 03/04/2023 02/10/23   [place] Bing, MD  tiZANidine (ZANAFLEX) 4 MG tablet Take 4 mg by mouth 2 (two) times daily as needed. 03/30/23   [provider]  Vitamin D, Ergocalciferol, (DRISDOL) 1.25 MG (50000 UNIT) CAPS capsule Take 50,000 Units by mouth every 7 (seven) days. Patient not taking: Reported on 03/04/2023    [provider]      Allergies    Blue dyes (parenteral), Blueberry [vaccinium angustifolium], Raspberry, Shellfish allergy, Amitriptyline, and Sumatriptan    Review of Systems   Review of Systems  HENT:  Positive for dental problem.   Gastrointestinal:  Positive for blood in stool, diarrhea and hematochezia.    Physical Exam Updated Vital Signs BP 121/75   Pulse 78   Temp 98.1 F (36.7 C)   Resp 20   Ht 5\' 8"  (1.727 m)   Wt 118 kg   SpO2 98%   BMI 39.55 kg/m  Physical Exam Vitals and nursing note reviewed. Exam conducted with a chaperone present.  Constitutional:      General: She is not in acute distress.    Appearance: Normal appearance. She is well-developed. She is obese. She is not toxic-appearing.  HENT:     Head: Normocephalic and atraumatic.     Right Ear: External ear normal.     Left Ear: External ear normal.     Mouth/Throat:     Comments: Mild left upper gingival swelling and erythema without obvious abscess. Eyes:     Conjunctiva/sclera: Conjunctivae normal.  Cardiovascular:     Rate and Rhythm: Normal rate and regular rhythm.     Heart sounds: No murmur heard. Pulmonary:     Effort: Pulmonary effort is normal. No respiratory distress.     Breath sounds: Normal breath sounds.  Abdominal:     General: Bowel sounds are normal.     Palpations: Abdomen is soft.     Tenderness: There is  no abdominal tenderness.  Genitourinary:    Rectum: Normal. Guaiac result negative.  Musculoskeletal:        General: No swelling.     Cervical back: Neck supple.  Skin:    General: Skin is warm and dry.     Capillary Refill: Capillary refill takes less than 2 seconds.  Neurological:     General: No focal deficit present.     Mental Status: She is alert.  Psychiatric:        Mood and Affect: Mood normal.     ED Results / Procedures / Treatments   Labs (all labs ordered are listed, but only abnormal results are displayed) Labs Reviewed  CBC WITH DIFFERENTIAL/PLATELET - Abnormal; Notable for the following components:  Result Value   Hemoglobin 10.7 (*)    HCT 34.8 (*)    All other components within normal limits  OCCULT BLOOD X 1 CARD TO LAB, STOOL  BASIC METABOLIC PANEL    EKG None  Radiology No results found.  Procedures Procedures    Medications Ordered in ED Medications  ketorolac (TORADOL) 30 MG/ML injection 30 mg (has no administration in time range)  oxyCODONE-acetaminophen (PERCOCET/ROXICET) 5-325 MG per tablet 1 tablet (1 tablet Oral Given 06/26/23 1055)  albuterol (VENTOLIN HFA) 108 (90 Base) MCG/ACT inhaler 1-2 puff (2 puffs Inhalation Given 06/26/23 1104)    ED Course/ Medical Decision Making/ A&P                                 Medical Decision Making Amount and/or Complexity of Data Reviewed Labs: ordered.  Risk Prescription drug management.   This patient presents to the ED with chief complaint(s) of dental pain and rectal bleeding with pertinent past medical history of anal fissure, rectal abscess, IBS which further complicates the presenting complaint. The complaint involves an extensive differential diagnosis and also carries with it a high risk of complications and morbidity.    The differential diagnosis includes anal fissure, dental abscess,  Additional history obtained: Additional history obtained from significant other Records  reviewed Primary Care Documents  ED Course and Reassessment:   Independent labs interpretation:  The following labs were independently interpreted:  CBC: Mild anemia which is chronic per historical values BMP: No notable findings  Independent visualization of imaging:  Consultation: - Consulted or discussed management/test interpretation w/ external professional: None  Consideration for admission or further workup: Considered for admission or further workup however patient's vital signs, physical exam, labs have all been reassuring.  Patient will be given outpatient pain management control.  Patient is scheduled to have the tooth removed on Tuesday the dentist and will continue on antibiotics until then.        Final Clinical Impression(s) / ED Diagnoses Final diagnoses:  Pain, dental    Rx / DC Orders ED Discharge Orders          Ordered    naproxen (NAPROSYN) 375 MG tablet  3 times daily with meals        06/26/23 1201              Dolphus Jenny, PA-C 06/26/23 1204    Alvira Monday, MD 06/28/23 1130

## 2023-06-26 NOTE — ED Notes (Signed)
Patient given discharge instructions. Questions were answered. Patient verbalized understanding of discharge instructions and care at home.  

## 2023-06-28 ENCOUNTER — Other Ambulatory Visit: Payer: Self-pay

## 2023-06-28 ENCOUNTER — Encounter (HOSPITAL_COMMUNITY): Payer: Self-pay

## 2023-06-28 ENCOUNTER — Emergency Department (HOSPITAL_COMMUNITY)
Admission: EM | Admit: 2023-06-28 | Discharge: 2023-06-28 | Disposition: A | Discharge status: Home / Self Care | Payer: Medicaid Other | Attending: Emergency Medicine | Admitting: Emergency Medicine

## 2023-06-28 DIAGNOSIS — K0889 Other specified disorders of teeth and supporting structures: Secondary | ICD-10-CM | POA: Diagnosis present

## 2023-06-28 MED ORDER — CLINDAMYCIN HCL 300 MG PO CAPS
300.0000 mg | ORAL_CAPSULE | Freq: Once | ORAL | Status: AC
  Administered 2023-06-28: 300 mg via ORAL
  Filled 2023-06-28: qty 1

## 2023-06-28 MED ORDER — FLUCONAZOLE 150 MG PO TABS: 150.0000 mg | ORAL_TABLET | Freq: Every day | ORAL | 0 refills | Status: DC

## 2023-06-28 MED ORDER — CLINDAMYCIN HCL 300 MG PO CAPS: 300.0000 mg | ORAL_CAPSULE | Freq: Three times a day (TID) | ORAL | 0 refills | Status: DC

## 2023-06-28 MED ORDER — KETOROLAC TROMETHAMINE 30 MG/ML IJ SOLN
30.0000 mg | Freq: Once | INTRAMUSCULAR | Status: AC
  Administered 2023-06-28: 30 mg via INTRAMUSCULAR
  Filled 2023-06-28: qty 1

## 2023-06-28 NOTE — ED Provider Notes (Signed)
[facility]-EMERGENCY DEPT [facility] Emergency Department Provider Note MRN:  [id number]  Arrival date & time: 06/28/23     Chief Complaint   Dental Pain   History of Present Illness   Jacqueline Gonzalez is a 22 y.o. year-old female presents to the ED with chief complaint of dental pain x 1 week has seen dentistry.  Has a follow-up appointment with dental tomorrow.  States that she has been on amox, but reports worsening pain and swelling.  Denies pregnancy or breast feeding.  History provided by patient.   Review of Systems  Pertinent positive and negative review of systems noted in HPI.    Physical Exam   Vitals:   06/28/23 0113  BP: 136/80  Pulse: 77  Resp: 18  Temp: 98.3 F (36.8 C)  SpO2: 100%    CONSTITUTIONAL:  non toxic-appearing, NAD NEURO:  Alert and oriented x 3, CN 3-12 grossly intact EYES:  eyes equal and reactive ENT/NECK:  Supple, no stridor, poor dentition, no drainable abscess, no sign of ludwig's angina CARDIO:  normal rate, regular rhythm, appears well-perfused  PULM:  No respiratory distress, CTAB GI/GU:  non-distended,  MSK/SPINE:  No gross deformities, no edema, moves all extremities  SKIN:  no rash, atraumatic   *Additional and/or pertinent findings included in MDM below  Diagnostic and Interventional Summary    EKG Interpretation Date/Time:    Ventricular Rate:    PR Interval:    QRS Duration:    QT Interval:    QTC Calculation:   R Axis:      Text Interpretation:         Labs Reviewed - No data to display  No orders to display    Medications  ketorolac (TORADOL) 30 MG/ML injection 30 mg (has no administration in time range)  clindamycin (CLEOCIN) capsule 300 mg (has no administration in time range)     Procedures  /  Critical Care Procedures  ED Course and Medical Decision Making  I have reviewed the triage vital signs, the nursing notes, and pertinent available records from the EMR.  Social Determinants Affecting  Complexity of Care: Patient has no clinically significant social determinants affecting this chief complaint..   ED Course:    Medical Decision Making   Patient with dentalgia.  No abscess requiring immediate incision and drainage.  Exam not concerning for Ludwig's angina or pharyngeal abscess.  Will treat with clinda. Pt instructed to follow-up with dentist.  Discussed return precautions. Pt safe for discharge.      Consultants: No consultations were needed in caring for this patient.   Treatment and Plan: Emergency department workup does not suggest an emergent condition requiring admission or immediate intervention beyond  what has been performed at this time. The patient is safe for discharge and has  been instructed to return immediately for worsening symptoms, change in  symptoms or any other concerns    Final Clinical Impressions(s) / ED Diagnoses     ICD-10-CM   1. Pain, dental  K08.89       ED Discharge Orders          Ordered    clindamycin (CLEOCIN) 300 MG capsule  3 times daily        06/28/23 0254    fluconazole (DIFLUCAN) 150 MG tablet  Daily        06/28/23 0254              Discharge Instructions Discussed with and Provided to Patient:  Discharge Instructions      Please follow-up with your dentist.  Take medications as prescribed.  Alternate Tylenol and Motrin for pain.       Roxy Horseman, PA-C 06/28/23 0981    Nicanor Alcon, April, MD 06/28/23 1914

## 2023-06-28 NOTE — Progress Notes (Deleted)
Office Visit Note  Patient: Jacqueline Gonzalez             Date of Birth: [birth year]           MRN: [id number]             PCP: Levonne Lapping, NP Referring: Levonne Lapping, NP Visit Date: 07/12/2023   Subjective:  No chief complaint on file.   History of Present Illness: Jacqueline Gonzalez is a 22 y.o. female here for follow up for continued joint pain at multiple areas currently having trouble especially in her wrist and ankles.    Previous HPI 04/12/2023 Jacqueline Gonzalez is a 22 y.o. female here for follow up for continued joint pain at multiple areas currently having trouble especially in her wrist and ankles.  So far does not feel hydroxychloroquine has changed her symptoms dramatically. Also still has ongoing lower abdominal and pelvic pain chronically. She had laparoscopy for this with an adhesion found but not thought clinically important. Still also has persistent diarrhea and constipation.  Has been getting some itchy rash and describes redness along the inner sides and underneath her breasts was recommended to use her topical eczema cream but not seeing any benefit so far.   Previous HPI 02/09/2023 Jacqueline Gonzalez is a 22 y.o. female here for follow up for continued joint pain at multiple areas currently having trouble especially in her wrist and ankles.  Workup at our initial visit showed a high sedimentation rate high complement C3 and elevated RNP antibody titer so some concern for underlying inflammatory arthritis process.  She is noticing more symptoms around the ganglion cyst of the left wrist and pain especially with active movement.  Ankle pain and soreness especially with use but not noticing any specific nodules just generalized swelling.   Previous HPI 01/08/23 Jacqueline Gonzalez is a 22 y.o. female here for evaluation of positive ANA associated with joint pains and elevated sedimentation rate.  She reports longstanding history of joint pain in multiple areas going all the  way back to adolescence.  However her current problems with persistent and sometimes activity limiting widespread pain has been more prominent in the past 1 year.  This is also associated with worsening chronic fatigue.  She did not recall any preceding serious infection.  She mostly describes this as being all over the but sometimes in particular has had more back and chronic pelvic pain.  Has had low back pain and radicular symptoms extending throughout the left leg up to include part of the perineum.  Also reports previous diagnosis with knee osteoarthritis.  Pain is varying in severity worse on some days than others not associated with much visible swelling or discoloration in affected areas.  Also sensitive to pressure or temperature changes in affected areas.  Previous treatments tried include gabapentin, oxycodone, and high-dose ibuprofen but did not find any of them highly effective. Lab testing looking into these increase symptoms in February showing positive ANA, sed rate of 40, and deficient vitamin D at 4.6.  She was also started on 50,000 units weekly supplementation for vitamin D but has not felt a noticeable difference. She has history of eczema has been treated with what sounds like topical emollients in dermatology office but does not currently follow-up.  Describes her skin breaking out and irritation if having prolonged sun exposure.  No specific lesions persisting mostly just the irritation.  She reports several episodes with cystic lesions in the skin under her armpits under the breasts and in  the groin.  Sometimes with drainage.  No previous diagnosis of hidradenitis suppurativa. She intermittently gets thrush possibly associated with her inhaler treatment but she does not use this consistently.  Does not get frequent oral nasal ulcers.  She denies typical Raynaud's symptoms, lymphadenopathy, or history of blood clots. Is also had more trouble with migraine headaches during this year was  started on injection treatments for the past 3 months.  Also has persistent irritable bowel symptoms mixed constipation and diarrhea on Bentyl with some benefit.  She experiences some kind of dizziness or vertiginous symptoms these come and go without a specific position or provocation.  Says sometimes last up to 1 hour at a time.   Family history includes an aunt with Graves' disease but no first-degree relative known autoimmune disease.   Labs reviewed 11/2022 TSH wnl   08/2022 ANA pos ESR 40 Vit D 4.6 RF neg CCP neg   No Rheumatology ROS completed.   PMFS History:  Patient Active Problem List   Diagnosis Date Noted   Intertrigo 04/12/2023   Seronegative inflammatory arthritis 02/09/2023   Precordial pain 01/28/2023   Positive ANA (antinuclear antibody) 01/08/2023   IBS (irritable bowel syndrome) 01/08/2023   Sedimentation rate elevation 01/08/2023   Polyarthralgia 01/08/2023   Vitamin D deficiency 01/08/2023   PCOS (polycystic ovarian syndrome) 08/26/2022   LGSIL of cervix of undetermined significance 08/26/2022   History of PID 08/18/2022   Rectal abscess supralevator s/p I&D 03/14/2022 03/14/2022   Rectal pain    UTI (urinary tract infection) 03/10/2022   Rectal bleeding    Anal fissure    Abnormal CT scan, colon    Proctocolitis 03/09/2022   Menorrhagia with irregular cycle 11/18/2021   Dysmenorrhea 11/18/2021   Chronic anemia 11/17/2021   Lower abdominal pain 11/07/2021   Moderate episode of recurrent major depressive disorder (HCC) 06/23/2021   Low back pain 05/02/2021   Headache 09/03/2020   Nonspecific abdominal pain 08/30/2020   Ganglion cyst of dorsum of left wrist 11/03/2019   Constipation, chronic 01/21/2018   Irregular periods 01/21/2018   Generalized anxiety disorder 01/21/2018    Past Medical History:  Diagnosis Date   Anemia    Anxiety    Arthritis    knees   Chlamydia 11/13/2021   Constipation, chronic    Depression    Dyspnea    ON OCC    Environmental allergies    Ganglion cyst of dorsum of left wrist    RECURRENT   GERD (gastroesophageal reflux disease)    watches diet   Headache MIGRAINE    History of hypertension    IN PAST, NO RECENT HTN PER PT 01-19-2023   IBS (irritable bowel syndrome)    Lactose intolerance    PCOS (polycystic ovarian syndrome)    Pre-diabetes     Family History  Problem Relation Age of Onset   Heart failure Mother    Migraines Mother    Irritable bowel syndrome Mother    Other Mother 40       Tricuspid valve regurgitation   Asthma Father    Hypertension Maternal Grandmother    Diabetes Maternal Grandmother    Hypertension Maternal Grandfather    Diabetes Maternal Grandfather    Hypertension Paternal Grandmother    Stroke Paternal Grandmother    Diabetes Paternal Grandmother    Hypertension Paternal Grandfather    Diabetes Paternal Grandfather    Kidney disease Paternal Grandfather    Ovarian cancer Maternal Hollie Salk' disease  Paternal Aunt    Stomach cancer Neg Hx    Esophageal cancer Neg Hx    Colon cancer Neg Hx    Past Surgical History:  Procedure Laterality Date   GANGLION CYST EXCISION Left 04/24/2021   Procedure: left recurrent dorsal carpal ganglion excision;  Surgeon: Gomez Cleverly, MD;  Location: [facility];  Service: Orthopedics;  Laterality: Left;   INCISION AND DRAINAGE PERIRECTAL ABSCESS N/A 03/14/2022   Procedure: IRRIGATION AND DEBRIDEMENT PERIRECTAL ABSCESS;  Surgeon: Karie Soda, MD;  Location: [facility];  Service: General;  Laterality: N/A;   LAPAROSCOPY N/A 02/10/2023   Procedure: LAPAROSCOPY DIAGNOSTIC;  Surgeon: [place] Bing, MD;  Location: [facility];  Service: Gynecology;  Laterality: N/A;   TONSILLECTOMY AND ADENOIDECTOMY     AGE 89   WISDOM TOOTH EXTRACTION  03/18/2021   WRIST GANGLION EXCISION Left 02/2020   Social History   Social History Narrative   Not on file   Immunization History  Administered Date(s)  Administered   HPV 9-valent 02/19/2012, 12/19/2012, 05/01/2013   Influenza,inj,Quad PF,6+ Mos 06/15/2018   Meningococcal Mcv4o 11/21/2018   Moderna Sars-Covid-2 Vaccination 11/22/2019, 12/20/2019     Objective: Vital Signs: There were no vitals taken for this visit.   Physical Exam   Musculoskeletal Exam: ***  CDAI Exam: CDAI Score: -- Patient Global: --; Provider Global: -- Swollen: --; Tender: -- Joint Exam 07/12/2023   No joint exam has been documented for this visit   There is currently no information documented on the homunculus. Go to the Rheumatology activity and complete the homunculus joint exam.  Investigation: No additional findings.  Imaging: DG Chest 2 View  Result Date: 06/14/2023 CLINICAL DATA:  Productive cough, shortness of breath. EXAM: CHEST - 2 VIEW COMPARISON:  April 15, 2023. FINDINGS: The heart size and mediastinal contours are within normal limits. Both lungs are clear. The visualized skeletal structures are unremarkable. IMPRESSION: No active cardiopulmonary disease. Electronically Signed   By: Lupita Raider M.D.   On: 06/14/2023 13:45    Recent Labs: Lab Results  Component Value Date   WBC 6.2 06/26/2023   HGB 10.7 (L) 06/26/2023   PLT 237 06/26/2023   NA 137 06/26/2023   K 4.2 06/26/2023   CL 105 06/26/2023   CO2 27 06/26/2023   GLUCOSE 98 06/26/2023   BUN 9 06/26/2023   CREATININE 0.80 06/26/2023   BILITOT 0.2 (L) 04/15/2023   ALKPHOS 71 04/15/2023   AST 11 (L) 04/15/2023   ALT 11 04/15/2023   PROT 7.4 04/15/2023   ALBUMIN 3.9 04/15/2023   CALCIUM 8.9 06/26/2023   GFRAA 147 08/28/2020    Speciality Comments: No specialty comments available.  Procedures:  No procedures performed Allergies: Blue dyes (parenteral), Blueberry [vaccinium angustifolium], Raspberry, Shellfish allergy, Amitriptyline, and Sumatriptan   Assessment / Plan:     Visit Diagnoses: No diagnosis found.  ***  Orders: No orders of the defined types  were placed in this encounter.  No orders of the defined types were placed in this encounter.    Follow-Up Instructions: No follow-ups on file.   Metta Clines, RT  Note - This record has been created using [employer].  Chart creation errors have been sought, but may not always  have been located. Such creation errors do not reflect on  the standard of medical care.

## 2023-06-28 NOTE — Discharge Instructions (Signed)
Please follow-up with your dentist.  Take medications as prescribed.  Alternate Tylenol and Motrin for pain.

## 2023-06-28 NOTE — ED Triage Notes (Signed)
Pt reports with left dental pain, facial swelling, left ear pain, throat pain, and headache since Thanksgiving. Pt reports starting abts Tuesday but nothing is getting better.

## 2023-07-05 ENCOUNTER — Telehealth: Payer: Self-pay | Admitting: Internal Medicine

## 2023-07-05 NOTE — Telephone Encounter (Signed)
Pt called requesting her appointment on 07/12/23 at 2:40 be a virtual visit. Pt states she will not be in town and really needs to see him.

## 2023-07-06 ENCOUNTER — Other Ambulatory Visit: Payer: Self-pay

## 2023-07-06 ENCOUNTER — Telehealth: Payer: Self-pay | Admitting: Internal Medicine

## 2023-07-06 ENCOUNTER — Encounter (HOSPITAL_BASED_OUTPATIENT_CLINIC_OR_DEPARTMENT_OTHER): Payer: Self-pay | Admitting: Emergency Medicine

## 2023-07-06 DIAGNOSIS — K5900 Constipation, unspecified: Secondary | ICD-10-CM | POA: Insufficient documentation

## 2023-07-06 DIAGNOSIS — R102 Pelvic and perineal pain: Secondary | ICD-10-CM | POA: Insufficient documentation

## 2023-07-06 DIAGNOSIS — R1030 Lower abdominal pain, unspecified: Secondary | ICD-10-CM | POA: Diagnosis present

## 2023-07-06 LAB — LIPASE, BLOOD: Lipase: 11 U/L (ref 11–51)

## 2023-07-06 LAB — COMPREHENSIVE METABOLIC PANEL
ALT: 14 U/L (ref 0–44)
AST: 13 U/L — ABNORMAL LOW (ref 15–41)
Albumin: 4.1 g/dL (ref 3.5–5.0)
Alkaline Phosphatase: 68 U/L (ref 38–126)
Anion gap: 9 (ref 5–15)
BUN: 7 mg/dL (ref 6–20)
CO2: 24 mmol/L (ref 22–32)
Calcium: 9.4 mg/dL (ref 8.9–10.3)
Chloride: 105 mmol/L (ref 98–111)
Creatinine, Ser: 0.77 mg/dL (ref 0.44–1.00)
GFR, Estimated: >60 mL/min (ref >60)
Glucose, Bld: 96 mg/dL (ref 70–99)
Potassium: 3.6 mmol/L (ref 3.5–5.1)
Sodium: 138 mmol/L (ref 135–145)
Total Bilirubin: 0.3 mg/dL (ref <1.2)
Total Protein: 7.5 g/dL (ref 6.5–8.1)

## 2023-07-06 LAB — CBC
HCT: 34 % — ABNORMAL LOW (ref 36.0–46.0)
Hemoglobin: 11 g/dL — ABNORMAL LOW (ref 12.0–15.0)
MCH: 26.9 pg (ref 26.0–34.0)
MCHC: 32.4 g/dL (ref 30.0–36.0)
MCV: 83.1 fL (ref 80.0–100.0)
Platelets: 243 10*3/uL (ref 150–400)
RBC: 4.09 MIL/uL (ref 3.87–5.11)
RDW: 14 % (ref 11.5–15.5)
WBC: 5 10*3/uL (ref 4.0–10.5)
nRBC: 0 % (ref 0.0–0.2)

## 2023-07-06 NOTE — ED Triage Notes (Signed)
Patient presents with lower abd/pelvic pain since Friday. Reports pain radiating to back. + nausea

## 2023-07-06 NOTE — Telephone Encounter (Signed)
Pt called again asking if her appointment on 07/12/23 at 2:40 could be a virtual visit. Pt states she will not be in town and really needs to see him. Pt would like to know as soon as she can.

## 2023-07-07 ENCOUNTER — Emergency Department (HOSPITAL_BASED_OUTPATIENT_CLINIC_OR_DEPARTMENT_OTHER)
Admission: EM | Admit: 2023-07-07 | Discharge: 2023-07-07 | Disposition: A | Discharge status: Home / Self Care | Payer: Medicaid Other | Attending: Emergency Medicine | Admitting: Emergency Medicine

## 2023-07-07 DIAGNOSIS — K5900 Constipation, unspecified: Secondary | ICD-10-CM

## 2023-07-07 DIAGNOSIS — G8929 Other chronic pain: Secondary | ICD-10-CM

## 2023-07-07 LAB — URINALYSIS, ROUTINE W REFLEX MICROSCOPIC
Bilirubin Urine: NEGATIVE
Glucose, UA: NEGATIVE mg/dL
Hgb urine dipstick: NEGATIVE
Ketones, ur: NEGATIVE mg/dL
Leukocytes,Ua: NEGATIVE
Nitrite: NEGATIVE
Protein, ur: 30 mg/dL — AB
Specific Gravity, Urine: 1.036 — ABNORMAL HIGH (ref 1.005–1.030)
pH: 5.5 (ref 5.0–8.0)

## 2023-07-07 LAB — PREGNANCY, URINE: Preg Test, Ur: NEGATIVE

## 2023-07-07 MED ORDER — KETOROLAC TROMETHAMINE 30 MG/ML IJ SOLN
30.0000 mg | Freq: Once | INTRAMUSCULAR | Status: AC
  Administered 2023-07-07: 30 mg via INTRAMUSCULAR
  Filled 2023-07-07: qty 1

## 2023-07-07 NOTE — ED Provider Notes (Signed)
[place] EMERGENCY DEPARTMENT AT [facility]  Provider Note  CSN: [id number] Arrival date & time: 07/06/23 2127  History Chief Complaint  Patient presents with   Abdominal Pain    Jacqueline Gonzalez is a 22 y.o. female with history of chronic abdominal pain and pelvic pain reports 4 days of lower abdomen and pelvic pain, nausea without vomiting, no fever. Has not had a BM in several days. Denies dysuria or vaginal discharge. Has not tried taking anything for her symptoms.    Home Medications Prior to Admission medications   Medication Sig Start Date End Date Taking? Authorizing Provider  ADVAIR DISKUS 100-50 MCG/ACT AEPB Inhale 1 puff into the lungs 2 (two) times daily. 11/21/22   [provider]  albuterol (VENTOLIN HFA) 108 (90 Base) MCG/ACT inhaler Inhale into the lungs every 6 (six) hours as needed for wheezing or shortness of breath.    [provider]  buPROPion (WELLBUTRIN XL) 150 MG 24 hr tablet Take by mouth. 03/12/23   [provider]  cetirizine (ZYRTEC) 10 MG tablet Take 1 tablet (10 mg total) by mouth daily. 10/18/22   Redwine, Madison A, PA-C  clindamycin (CLEOCIN) 300 MG capsule Take 1 capsule (300 mg total) by mouth 3 (three) times daily. 06/28/23   Roxy Horseman, PA-C  dicyclomine (BENTYL) 20 MG tablet Take 1 tablet (20 mg total) by mouth 2 (two) times daily for 7 days. 01/02/23 02/10/23  Sloan Leiter, DO  DTx App - Gastrointestinal [facility] IBS) MISC Use as directed Patient not taking: Reported on 04/12/2023 11/25/22   Jenel Lucks, MD  [facility] 120 MG/ML SOAJ Inject into the skin. Patient not taking: Reported on 04/12/2023 01/02/23   [provider]  FAMOTIDINE PO Take by mouth daily.    [provider]  fluconazole (DIFLUCAN) 150 MG tablet Take 1 tablet (150 mg total) by mouth daily. 06/28/23   Roxy Horseman, PA-C  FLUoxetine (PROZAC) 20 MG capsule Take 60 mg by mouth every morning. 04/10/23   [provider]  FLUoxetine (PROZAC) 40 MG capsule Take 1 capsule (40 mg total) by mouth daily. Patient taking differently: Take 40 mg by mouth every morning. 12/31/21   Cora Collum, DO  fluticasone (FLONASE) 50 MCG/ACT nasal spray Place 2 sprays into both nostrils daily. 06/27/22   Claiborne Rigg, NP  gabapentin (NEURONTIN) 100 MG capsule Take 1 capsule (100 mg total) by mouth 3 (three) times daily. Patient not taking: Reported on 03/04/2023 02/17/23   Reva Bores, MD  Galcanezumab-gnlm [facility]) Inject 1 Dose into the skin every 30 (thirty) days. Patient not taking: Reported on 02/09/2023    [provider]  hydroxychloroquine (PLAQUENIL) 200 MG tablet Take 1 tablet (200 mg total) by mouth daily. Patient taking differently: Take 200 mg by mouth daily. Has not started yet 02/09/23   Fuller Plan, MD  ibuprofen (ADVIL) 600 MG tablet Take 1 tablet (600 mg total) by mouth every 6 (six) hours as needed. 02/10/23   Adelino Bing, MD  medroxyPROGESTERone (PROVERA) 10 MG tablet 1 tab po qday x 14 days each month. You can expect a period to start after the finishing the 14 days. Patient not taking: Reported on 03/04/2023 08/18/22   Kief Bing, MD  oxyCODONE-acetaminophen (PERCOCET/ROXICET) 5-325 MG tablet Take 1 tablet by mouth every 6 (six) hours as needed. Patient not taking: Reported on 03/04/2023 02/17/23   Reva Bores, MD  pantoprazole (PROTONIX) 40 MG tablet Take 1 tablet (  40 mg total) by mouth 2 (two) times daily before a meal. Patient taking differently: Take 40 mg by mouth as needed. 09/24/22   Unk Lightning, PA  pregabalin (LYRICA) 50 MG capsule Take 50 mg by mouth 2 (two) times daily. 03/24/23   [provider]  propranolol (INDERAL) 60 MG tablet Take 60 mg by mouth 2 (two) times daily. 03/30/23   [provider]  Psyllium (METAMUCIL) 48.57 % POWD 1 spoonful daily for the next 14 days Patient not taking: Reported on 03/04/2023 02/10/23    Foraker Bing, MD  tiZANidine (ZANAFLEX) 4 MG tablet Take 4 mg by mouth 2 (two) times daily as needed. 03/30/23   [provider]  Vitamin D, Ergocalciferol, (DRISDOL) 1.25 MG (50000 UNIT) CAPS capsule Take 50,000 Units by mouth every 7 (seven) days. Patient not taking: Reported on 03/04/2023    [provider]     Allergies    Blue dyes (parenteral), Blueberry [vaccinium angustifolium], Raspberry, Shellfish allergy, Amitriptyline, and Sumatriptan   Review of Systems   Review of Systems Please see HPI for pertinent positives and negatives  Physical Exam BP (!) 130/94   Pulse 86   Temp 98.4 F (36.9 C) (Temporal)   Resp 18   Ht 5\' 8"  (1.727 m)   Wt 117.9 kg   LMP  (Approximate)   SpO2 100%   BMI 39.53 kg/m   Physical Exam Vitals and nursing note reviewed.  Constitutional:      Appearance: Normal appearance.  HENT:     Head: Normocephalic and atraumatic.     Nose: Nose normal.     Mouth/Throat:     Mouth: Mucous membranes are moist.  Eyes:     Extraocular Movements: Extraocular movements intact.     Conjunctiva/sclera: Conjunctivae normal.  Cardiovascular:     Rate and Rhythm: Normal rate.  Pulmonary:     Effort: Pulmonary effort is normal.     Breath sounds: Normal breath sounds.  Abdominal:     General: Abdomen is flat.     Palpations: Abdomen is soft.     Tenderness: There is abdominal tenderness in the suprapubic area. There is no guarding. Negative signs include Murphy's sign and McBurney's sign.  Musculoskeletal:        General: No swelling. Normal range of motion.     Cervical back: Neck supple.  Skin:    General: Skin is warm and dry.  Neurological:     General: No focal deficit present.     Mental Status: She is alert.  Psychiatric:        Mood and Affect: Mood normal.     ED Results / Procedures / Treatments   EKG None  Procedures Procedures  Medications Ordered in the ED Medications  ketorolac (TORADOL) 30 MG/ML  injection 30 mg (30 mg Intramuscular Given 07/07/23 0305)    Initial Impression and Plan  Patient here with lower abdominal pain x 4 days, abdomen with some tenderness, but no peritoneal signs. Labs done in triage show normal CBC, CMP, lipase and UA. No concern for acute surgical process including appendicitis, cholecystitis or ovarian torsion. Suspect constipation and/or PCOS as a cause. Advised opioid medications may exacerbate her symptoms instead of helping and should be avoided. Will give a dose of toradol for pain, recommend she start taking OTC medications for constipation and follow up with PCP if not improving after moving her bowels. RTED for any other concerns.   ED Course  MDM Rules/Calculators/A&P Medical Decision Making Problems Addressed: Chronic abdominal pain: chronic illness or injury with exacerbation, progression, or side effects of treatment Constipation, unspecified constipation type: chronic illness or injury with exacerbation, progression, or side effects of treatment  Amount and/or Complexity of Data Reviewed Labs: ordered. Decision-making details documented in ED Course.  Risk OTC drugs. Prescription drug management.     Final Clinical Impression(s) / ED Diagnoses Final diagnoses:  Chronic abdominal pain  Constipation, unspecified constipation type    Rx / DC Orders ED Discharge Orders     None        Pollyann Savoy, MD 07/07/23 [phone]

## 2023-07-08 NOTE — Progress Notes (Signed)
Office Visit Note  Patient: Jacqueline Gonzalez             Date of Birth: Jul 15, 2001           MRN: [id number]             PCP: Levonne Lapping, NP Referring: Levonne Lapping, NP Visit Date: 07/09/2023   Subjective:  Follow-up (Patient states she is scarring easily and if she hurts herself it heals weirdly. Patient states she is still having a lot of stomach and pelvic pain. Patient states her arms are bruising easily. )   History of Present Illness:  Discussed the use of AI scribe software for clinical note transcription with the patient, who gave verbal consent to proceed.  History of Present Illness   Jacqueline Gonzalez is a 22 y.o. female here for follow up for continued joint pain at multiple areas currently having trouble especially in her wrist and ankles.  She reports no significant improvement despite the initiation of Plaquenil 200 mg daily. They describe a new onset of skin issues, characterized by abnormal healing of scars and easy bruising, particularly after blood draws. The patient also reports constant pain, which is severe enough to consider daily hospital visits, but denies any specific aggravating factors. Over-the-counter pain medications, such as ibuprofen and Tylenol, are currently being used for pain management.  The patient was recently seen by a gynecologist, who recommended a gastroenterology consult, scheduled for February. They also report a recent bout of bronchitis, which was treated with prednisone and antibiotics. During this time, they also had a tooth infection, which was managed by extraction. The patient did not notice any significant changes while on the short course of steroids.  The patient also reports occasional blood in their stool, which varies from bright red to black. The patient also has a history of eczema, which has been resistant to previous treatments. They deny any circulation problems in the colder weather.    Previous HPI 04/12/2023 Jacqueline Gonzalez is a 22 y.o. female here for follow up for continued joint pain at multiple areas currently having trouble especially in her wrist and ankles.  So far does not feel hydroxychloroquine has changed her symptoms dramatically. Also still has ongoing lower abdominal and pelvic pain chronically. She had laparoscopy for this with an adhesion found but not thought clinically important. Still also has persistent diarrhea and constipation.  Has been getting some itchy rash and describes redness along the inner sides and underneath her breasts was recommended to use her topical eczema cream but not seeing any benefit so far.   Previous HPI 02/09/2023 Jacqueline Gonzalez is a 22 y.o. female here for follow up for continued joint pain at multiple areas currently having trouble especially in her wrist and ankles.  Workup at our initial visit showed a high sedimentation rate high complement C3 and elevated RNP antibody titer so some concern for underlying inflammatory arthritis process.  She is noticing more symptoms around the ganglion cyst of the left wrist and pain especially with active movement.  Ankle pain and soreness especially with use but not noticing any specific nodules just generalized swelling.   Previous HPI 01/08/23 Jacqueline Gonzalez is a 22 y.o. female here for evaluation of positive ANA associated with joint pains and elevated sedimentation rate.  She reports longstanding history of joint pain in multiple areas going all the way back to adolescence.  However her current problems with persistent and sometimes activity limiting widespread pain has been more prominent  in the past 1 year.  This is also associated with worsening chronic fatigue.  She did not recall any preceding serious infection.  She mostly describes this as being all over the but sometimes in particular has had more back and chronic pelvic pain.  Has had low back pain and radicular symptoms extending throughout the left leg up to  include part of the perineum.  Also reports previous diagnosis with knee osteoarthritis.  Pain is varying in severity worse on some days than others not associated with much visible swelling or discoloration in affected areas.  Also sensitive to pressure or temperature changes in affected areas.  Previous treatments tried include gabapentin, oxycodone, and high-dose ibuprofen but did not find any of them highly effective. Lab testing looking into these increase symptoms in February showing positive ANA, sed rate of 40, and deficient vitamin D at 4.6.  She was also started on 50,000 units weekly supplementation for vitamin D but has not felt a noticeable difference. She has history of eczema has been treated with what sounds like topical emollients in dermatology office but does not currently follow-up.  Describes her skin breaking out and irritation if having prolonged sun exposure.  No specific lesions persisting mostly just the irritation.  She reports several episodes with cystic lesions in the skin under her armpits under the breasts and in the groin.  Sometimes with drainage.  No previous diagnosis of hidradenitis suppurativa. She intermittently gets thrush possibly associated with her inhaler treatment but she does not use this consistently.  Does not get frequent oral nasal ulcers.  She denies typical Raynaud's symptoms, lymphadenopathy, or history of blood clots. Is also had more trouble with migraine headaches during this year was started on injection treatments for the past 3 months.  Also has persistent irritable bowel symptoms mixed constipation and diarrhea on Bentyl with some benefit.  She experiences some kind of dizziness or vertiginous symptoms these come and go without a specific position or provocation.  Says sometimes last up to 1 hour at a time.   Family history includes an aunt with Graves' disease but no first-degree relative known autoimmune disease.   Labs reviewed 11/2022 TSH wnl    08/2022 ANA pos ESR 40 Vit D 4.6 RF neg CCP neg   Review of Systems  Constitutional:  Positive for fatigue.  HENT:  Positive for mouth dryness. Negative for mouth sores.   Eyes:  Negative for dryness.  Respiratory:  Positive for shortness of breath.   Cardiovascular:  Positive for chest pain and palpitations.  Gastrointestinal:  Positive for blood in stool, constipation and diarrhea.  Endocrine: Negative for increased urination.  Genitourinary:  Negative for involuntary urination.  Musculoskeletal:  Positive for joint pain, gait problem, joint pain, myalgias, muscle weakness, morning stiffness, muscle tenderness and myalgias. Negative for joint swelling.  Skin:  Positive for sensitivity to sunlight. Negative for color change, rash and hair loss.  Allergic/Immunologic: Negative for susceptible to infections.  Neurological:  Positive for dizziness and headaches.  Hematological:  Negative for swollen glands.  Psychiatric/Behavioral:  Positive for depressed mood and sleep disturbance. The patient is nervous/anxious.     PMFS History:  Patient Active Problem List   Diagnosis Date Noted   Intertrigo 04/12/2023   Seronegative inflammatory arthritis 02/09/2023   Precordial pain 01/28/2023   Positive ANA (antinuclear antibody) 01/08/2023   IBS (irritable bowel syndrome) 01/08/2023   Sedimentation rate elevation 01/08/2023   Polyarthralgia 01/08/2023   Vitamin D deficiency 01/08/2023  PCOS (polycystic ovarian syndrome) 08/26/2022   LGSIL of cervix of undetermined significance 08/26/2022   History of PID 08/18/2022   Rectal abscess supralevator s/p I&D 03/14/2022 03/14/2022   Rectal pain    UTI (urinary tract infection) 03/10/2022   Rectal bleeding    Anal fissure    Abnormal CT scan, colon    Proctocolitis 03/09/2022   Menorrhagia with irregular cycle 11/18/2021   Dysmenorrhea 11/18/2021   Chronic anemia 11/17/2021   Lower abdominal pain 11/07/2021   Moderate episode of  recurrent major depressive disorder (HCC) 06/23/2021   Low back pain 05/02/2021   Headache 09/03/2020   Nonspecific abdominal pain 08/30/2020   Ganglion cyst of dorsum of left wrist 11/03/2019   Constipation, chronic 01/21/2018   Irregular periods 01/21/2018   Generalized anxiety disorder 01/21/2018    Past Medical History:  Diagnosis Date   Anemia    Anxiety    Arthritis    knees   Chlamydia 11/13/2021   Constipation, chronic    Depression    Dyspnea    ON OCC   Environmental allergies    Ganglion cyst of dorsum of left wrist    RECURRENT   GERD (gastroesophageal reflux disease)    watches diet   Headache MIGRAINE    History of hypertension    IN PAST, NO RECENT HTN PER PT 01-19-2023   IBS (irritable bowel syndrome)    Lactose intolerance    PCOS (polycystic ovarian syndrome)    Pre-diabetes     Family History  Problem Relation Age of Onset   Heart failure Mother    Migraines Mother    Irritable bowel syndrome Mother    Other Mother 65       Tricuspid valve regurgitation   Asthma Father    Hypertension Maternal Grandmother    Diabetes Maternal Grandmother    Hypertension Maternal Grandfather    Diabetes Maternal Grandfather    Hypertension Paternal Grandmother    Stroke Paternal Grandmother    Diabetes Paternal Grandmother    Hypertension Paternal Grandfather    Diabetes Paternal Grandfather    Kidney disease Paternal Grandfather    Ovarian cancer Maternal Aunt    Graves' disease Paternal Aunt    Stomach cancer Neg Hx    Esophageal cancer Neg Hx    Colon cancer Neg Hx    Past Surgical History:  Procedure Laterality Date   GANGLION CYST EXCISION Left 04/24/2021   Procedure: left recurrent dorsal carpal ganglion excision;  Surgeon: Gomez Cleverly, MD;  Location: [facility] [place];  Service: Orthopedics;  Laterality: Left;   INCISION AND DRAINAGE PERIRECTAL ABSCESS N/A 03/14/2022   Procedure: IRRIGATION AND DEBRIDEMENT PERIRECTAL ABSCESS;  Surgeon:  Karie Soda, MD;  Location: [facility];  Service: General;  Laterality: N/A;   LAPAROSCOPY N/A 02/10/2023   Procedure: LAPAROSCOPY DIAGNOSTIC;  Surgeon:  Bing, MD;  Location: [facility] ;  Service: Gynecology;  Laterality: N/A;   TONSILLECTOMY AND ADENOIDECTOMY     AGE 33   WISDOM TOOTH EXTRACTION  03/18/2021   WRIST GANGLION EXCISION Left 02/2020   Social History   Social History Narrative   Not on file   Immunization History  Administered Date(s) Administered   HPV 9-valent 02/19/2012, 12/19/2012, 05/01/2013   Influenza,inj,Quad PF,6+ Mos 06/15/2018   Meningococcal Mcv4o 11/21/2018   Moderna Sars-Covid-2 Vaccination 11/22/2019, 12/20/2019     Objective: Vital Signs: BP 122/82 (BP Location: Left Arm, Patient Position: Sitting, Cuff Size: Normal)   Pulse 85   Resp  14   Ht 5\' 8"  (1.727 m)   Wt 257 lb (116.6 kg)   BMI 39.08 kg/m    Physical Exam Constitutional:      Appearance: She is obese.  Eyes:     Conjunctiva/sclera: Conjunctivae normal.  Cardiovascular:     Rate and Rhythm: Normal rate and regular rhythm.  Pulmonary:     Effort: Pulmonary effort is normal.     Breath sounds: Normal breath sounds.  Lymphadenopathy:     Cervical: No cervical adenopathy.  Skin:    General: Skin is warm and dry.     Comments: No digital pitting, no skin peeling or ulcers  Neurological:     Mental Status: She is alert.  Psychiatric:        Mood and Affect: Mood normal.      Musculoskeletal Exam:  Shoulders full ROM no tenderness or swelling Elbows full ROM no tenderness or swelling Left wrist ganglion cyst present, no other palpable swelling, pain provoked with flexion and extension b/l wrists, full ROM Fingers full ROM no tenderness or swelling Knees full ROM no tenderness or swelling Ankles full ROM tenderness to pressure and with movement, no palpable effusion     Investigation: No additional findings.  Imaging: No results found.   Recent  Labs: Lab Results  Component Value Date   WBC 5.0 07/06/2023   HGB 11.0 (L) 07/06/2023   PLT 243 07/06/2023   NA 138 07/06/2023   K 3.6 07/06/2023   CL 105 07/06/2023   CO2 24 07/06/2023   GLUCOSE 96 07/06/2023   BUN 7 07/06/2023   CREATININE 0.77 07/06/2023   BILITOT 0.3 07/06/2023   ALKPHOS 68 07/06/2023   AST 13 (L) 07/06/2023   ALT 14 07/06/2023   PROT 7.5 07/06/2023   ALBUMIN 4.1 07/06/2023   CALCIUM 9.4 07/06/2023   GFRAA 147 08/28/2020    Speciality Comments: No specialty comments available.  Procedures:  No procedures performed Allergies: Blue dyes (parenteral), Blueberry [vaccinium angustifolium], Raspberry, Shellfish allergy, Amitriptyline, and Sumatriptan   Assessment / Plan:     Visit Diagnoses: Seronegative inflammatory arthritis - Plan: predniSONE (DELTASONE) 10 MG tablet, Sedimentation rate, C-reactive protein Elevated sedimentation rate and positive ANA. Previous trial of Plaquenil without significant improvement. -Check current inflammation markers. -If inflammation persists and improvement noted with Prednisone, consider starting Methotrexate. -Follow-up after 2 weeks of Prednisone treatment to assess response and discuss potential initiation of Methotrexate.   High risk medication use Discussed risks of methotrexate, of prednisone. Including infections, drug reaction, cytopenias, hepatoxicity. Previous negative screening okay. Patient to be provided with drug information for review.    Abdominal and Pelvic Pain Chronic, constant pain. No positional aggravation. No relief with ibuprofen or Tylenol. Gynecological causes ruled out, gastroenterology consult pending in February. -Continue current pain management with ibuprofen and Tylenol as needed. -Already with GI appt agree to keep this  Skin Changes and Eczema Noted abnormal healing of skin and easy bruising. Recent eczema flare-ups. No improvement with previous eczema treatments.    The patient is  not currently using any form of birth control but has no specific plans for future pregnancy.   Orders: Orders Placed This Encounter  Procedures   Sedimentation rate   C-reactive protein   Meds ordered this encounter  Medications   predniSONE (DELTASONE) 10 MG tablet    Sig: Take 3 tablets (30 mg total) by mouth daily with breakfast for 7 days, THEN 2 tablets (20 mg total) daily with breakfast for 7  days, THEN 1 tablet (10 mg total) daily with breakfast for 7 days.    Dispense:  42 tablet    Refill:  0     Follow-Up Instructions: Return in about 3 months (around 10/07/2023) for ?RA/?MCTD GC/?MTX start f/u 3mos.   Fuller Plan, MD  Note - This record has been created using [employer].  Chart creation errors have been sought, but may not always  have been located. Such creation errors do not reflect on  the standard of medical care.

## 2023-07-09 ENCOUNTER — Ambulatory Visit: Payer: Medicaid Other | Attending: Internal Medicine | Admitting: Internal Medicine

## 2023-07-09 ENCOUNTER — Encounter: Payer: Self-pay | Admitting: Internal Medicine

## 2023-07-09 VITALS — BP 122/82 | HR 85 | Resp 14 | Ht 68.0 in | Wt 257.0 lb

## 2023-07-09 DIAGNOSIS — Z79899 Other long term (current) drug therapy: Secondary | ICD-10-CM

## 2023-07-09 DIAGNOSIS — L304 Erythema intertrigo: Secondary | ICD-10-CM

## 2023-07-09 DIAGNOSIS — R768 Other specified abnormal immunological findings in serum: Secondary | ICD-10-CM | POA: Diagnosis not present

## 2023-07-09 DIAGNOSIS — M1380 Other specified arthritis, unspecified site: Secondary | ICD-10-CM | POA: Diagnosis not present

## 2023-07-09 MED ORDER — PREDNISONE 10 MG PO TABS: ORAL_TABLET | ORAL | 0 refills | Status: AC

## 2023-07-10 LAB — C-REACTIVE PROTEIN: CRP: 8.3 mg/L — ABNORMAL HIGH (ref <8.0)

## 2023-07-10 LAB — SEDIMENTATION RATE: Sed Rate: 31 mm/h — ABNORMAL HIGH (ref 0–20)

## 2023-07-12 ENCOUNTER — Ambulatory Visit: Payer: Medicaid Other | Admitting: Internal Medicine

## 2023-07-12 DIAGNOSIS — Z79899 Other long term (current) drug therapy: Secondary | ICD-10-CM

## 2023-07-12 DIAGNOSIS — M1380 Other specified arthritis, unspecified site: Secondary | ICD-10-CM

## 2023-07-12 DIAGNOSIS — R768 Other specified abnormal immunological findings in serum: Secondary | ICD-10-CM

## 2023-07-12 DIAGNOSIS — L304 Erythema intertrigo: Secondary | ICD-10-CM

## 2023-07-21 ENCOUNTER — Emergency Department (HOSPITAL_BASED_OUTPATIENT_CLINIC_OR_DEPARTMENT_OTHER): Payer: Medicaid Other | Admitting: Radiology

## 2023-07-21 ENCOUNTER — Other Ambulatory Visit: Payer: Self-pay

## 2023-07-21 ENCOUNTER — Encounter (HOSPITAL_BASED_OUTPATIENT_CLINIC_OR_DEPARTMENT_OTHER): Payer: Self-pay

## 2023-07-21 DIAGNOSIS — Z20822 Contact with and (suspected) exposure to covid-19: Secondary | ICD-10-CM | POA: Diagnosis not present

## 2023-07-21 DIAGNOSIS — R053 Chronic cough: Secondary | ICD-10-CM | POA: Diagnosis not present

## 2023-07-21 DIAGNOSIS — G8929 Other chronic pain: Secondary | ICD-10-CM | POA: Diagnosis not present

## 2023-07-21 DIAGNOSIS — R109 Unspecified abdominal pain: Secondary | ICD-10-CM | POA: Diagnosis present

## 2023-07-21 LAB — COMPREHENSIVE METABOLIC PANEL
ALT: 15 U/L (ref 0–44)
AST: 16 U/L (ref 15–41)
Albumin: 4.4 g/dL (ref 3.5–5.0)
Alkaline Phosphatase: 67 U/L (ref 38–126)
Anion gap: 9 (ref 5–15)
BUN: 6 mg/dL (ref 6–20)
CO2: 22 mmol/L (ref 22–32)
Calcium: 9 mg/dL (ref 8.9–10.3)
Chloride: 105 mmol/L (ref 98–111)
Creatinine, Ser: 0.68 mg/dL (ref 0.44–1.00)
GFR, Estimated: >60 mL/min (ref >60)
Glucose, Bld: 106 mg/dL — ABNORMAL HIGH (ref 70–99)
Potassium: 3.7 mmol/L (ref 3.5–5.1)
Sodium: 136 mmol/L (ref 135–145)
Total Bilirubin: 0.3 mg/dL (ref 0.0–1.2)
Total Protein: 7.3 g/dL (ref 6.5–8.1)

## 2023-07-21 LAB — URINALYSIS, ROUTINE W REFLEX MICROSCOPIC
Bacteria, UA: NONE SEEN
Bilirubin Urine: NEGATIVE
Glucose, UA: NEGATIVE mg/dL
Hgb urine dipstick: NEGATIVE
Ketones, ur: NEGATIVE mg/dL
Nitrite: NEGATIVE
Specific Gravity, Urine: 1.014 (ref 1.005–1.030)
pH: 5.5 (ref 5.0–8.0)

## 2023-07-21 LAB — LIPASE, BLOOD: Lipase: <10 U/L — ABNORMAL LOW (ref 11–51)

## 2023-07-21 LAB — CBC
HCT: 36.6 % (ref 36.0–46.0)
Hemoglobin: 11.7 g/dL — ABNORMAL LOW (ref 12.0–15.0)
MCH: 26.5 pg (ref 26.0–34.0)
MCHC: 32 g/dL (ref 30.0–36.0)
MCV: 83 fL (ref 80.0–100.0)
Platelets: 278 10*3/uL (ref 150–400)
RBC: 4.41 MIL/uL (ref 3.87–5.11)
RDW: 13.8 % (ref 11.5–15.5)
WBC: 6 10*3/uL (ref 4.0–10.5)
nRBC: 0 % (ref 0.0–0.2)

## 2023-07-21 LAB — RESP PANEL BY RT-PCR (RSV, FLU A&B, COVID)  RVPGX2
Influenza A by PCR: NEGATIVE
Influenza B by PCR: NEGATIVE
Resp Syncytial Virus by PCR: NEGATIVE
SARS Coronavirus 2 by RT PCR: NEGATIVE

## 2023-07-21 MED ORDER — ALBUTEROL SULFATE HFA 108 (90 BASE) MCG/ACT IN AERS: 2.0000 | INHALATION_SPRAY | RESPIRATORY_TRACT | Status: DC | PRN

## 2023-07-21 NOTE — ED Triage Notes (Signed)
 Pt c/o exposure to black mold for over x1 yr, recently found out. Pt c/o headache, ongoing cough, intermittent SHOB, generalized body aches, low grade fevers, and severe lower abd pain. Reports N/V/D since Sunday, reports orange stools.

## 2023-07-22 ENCOUNTER — Emergency Department (HOSPITAL_BASED_OUTPATIENT_CLINIC_OR_DEPARTMENT_OTHER)
Admission: EM | Admit: 2023-07-22 | Discharge: 2023-07-22 | Disposition: A | Discharge status: Home / Self Care | Payer: Medicaid Other | Attending: Emergency Medicine | Admitting: Emergency Medicine

## 2023-07-22 DIAGNOSIS — R053 Chronic cough: Secondary | ICD-10-CM

## 2023-07-22 DIAGNOSIS — G8929 Other chronic pain: Secondary | ICD-10-CM

## 2023-07-22 NOTE — ED Provider Notes (Signed)
 [place] [facility]MENT AT [facility]  Provider Note  CSN: [id number] Arrival date & time: 07/21/23 1916  History Chief Complaint  Patient presents with   Multiple Complaints    Jacqueline Gonzalez is a 23 y.o. female with multiple chronic medical problems and frequent ED visits reports ongoing headache, cough, abdominal pain, and diarrhea. She states she recent found out there is black mold in her house.    Home Medications Prior to Admission medications   Medication Sig Start Date End Date Taking? Authorizing Provider  ADVAIR DISKUS 100-50 MCG/ACT AEPB Inhale 1 puff into the lungs 2 (two) times daily. Patient not taking: Reported on 07/09/2023 11/21/22   [provider]  albuterol  (VENTOLIN  HFA) 108 (90 Base) MCG/ACT inhaler Inhale into the lungs every 6 (six) hours as needed for wheezing or shortness of breath.    [provider]  buPROPion (WELLBUTRIN XL) 150 MG 24 hr tablet Take by mouth. 03/12/23   [provider]  cetirizine  (ZYRTEC ) 10 MG tablet Take 1 tablet (10 mg total) by mouth daily. 10/18/22   Redwine, Madison A, PA-C  clindamycin  (CLEOCIN ) 300 MG capsule Take 1 capsule (300 mg total) by mouth 3 (three) times daily. Patient not taking: Reported on 07/09/2023 06/28/23   Vicky Charleston, PA-C  dicyclomine  (BENTYL ) 20 MG tablet Take 1 tablet (20 mg total) by mouth 2 (two) times daily for 7 days. 01/02/23 02/10/23  Elnor Jayson LABOR, DO  DTx App - Gastrointestinal [facility] IBS) MISC Use as directed Patient not taking: Reported on 07/09/2023 11/25/22   Stacia Glendia BRAVO, MD  [facility] 120 MG/ML SOAJ Inject into the skin. Patient not taking: Reported on 07/09/2023 01/02/23   [provider]  FAMOTIDINE  PO Take by mouth daily. Patient not taking: Reported on 07/09/2023    [provider]  fluconazole  (DIFLUCAN ) 150 MG tablet Take 1 tablet (150 mg total) by mouth daily. 06/28/23   Vicky Charleston, PA-C  FLUoxetine  (PROZAC ) 20 MG  capsule Take 60 mg by mouth every morning. 04/10/23   [provider]  FLUoxetine  (PROZAC ) 40 MG capsule Take 1 capsule (40 mg total) by mouth daily. Patient taking differently: Take 40 mg by mouth every morning. 12/31/21   Paige, Victoria J, DO  fluticasone  (FLONASE ) 50 MCG/ACT nasal spray Place 2 sprays into both nostrils daily. 06/27/22   Fleming, Zelda W, NP  gabapentin  (NEURONTIN ) 100 MG capsule Take 1 capsule (100 mg total) by mouth 3 (three) times daily. Patient not taking: Reported on 07/09/2023 02/17/23   Fredirick Glenys RAMAN, MD  gabapentin  (NEURONTIN ) 300 MG capsule Take 300 mg by mouth 3 (three) times daily. 06/23/23   [provider]  Galcanezumab-gnlm (EMGALITY Russell) Inject 1 Dose into the skin every 30 (thirty) days. Patient not taking: Reported on 07/09/2023    [provider]  ibuprofen  (ADVIL ) 600 MG tablet Take 1 tablet (600 mg total) by mouth every 6 (six) hours as needed. 02/10/23   Izell Harari, MD  medroxyPROGESTERone  (PROVERA ) 10 MG tablet 1 tab po qday x 14 days each month. You can expect a period to start after the finishing the 14 days. Patient not taking: Reported on 07/09/2023 08/18/22   Izell Harari, MD  ondansetron  (ZOFRAN ) 4 MG tablet Take 4 mg by mouth 2 (two) times daily as needed. 05/27/23   [provider]  oxyCODONE -acetaminophen  (PERCOCET/ROXICET) 5-325 MG tablet Take 1 tablet by mouth every 6 (six) hours as needed. Patient not taking: Reported on 07/09/2023 02/17/23  Fredirick Glenys RAMAN, MD  pantoprazole  (PROTONIX ) 40 MG tablet Take 1 tablet (40 mg total) by mouth 2 (two) times daily before a meal. 09/24/22   Lemmon, Delon Gibson, PA  predniSONE  (DELTASONE ) 10 MG tablet Take 3 tablets (30 mg total) by mouth daily with breakfast for 7 days, THEN 2 tablets (20 mg total) daily with breakfast for 7 days, THEN 1 tablet (10 mg total) daily with breakfast for 7 days. 07/09/23 07/30/23  Jeannetta Lonni ORN, MD  pregabalin (LYRICA) 50 MG capsule  Take 50 mg by mouth 2 (two) times daily. Patient not taking: Reported on 07/09/2023 03/24/23   [provider]  propranolol (INDERAL) 60 MG tablet Take 60 mg by mouth 2 (two) times daily. 03/30/23   [provider]  Psyllium (METAMUCIL) 48.57 % POWD 1 spoonful daily for the next 14 days Patient not taking: Reported on 07/09/2023 02/10/23   Izell Harari, MD  tiZANidine (ZANAFLEX) 4 MG tablet Take 4 mg by mouth 2 (two) times daily as needed. 03/30/23   [provider]  Vitamin D , Ergocalciferol , (DRISDOL) 1.25 MG (50000 UNIT) CAPS capsule Take 50,000 Units by mouth every 7 (seven) days.    [provider]     Allergies    Blue dyes (parenteral), Blueberry [vaccinium angustifolium], Raspberry, Shellfish allergy, Amitriptyline , and Sumatriptan    Review of Systems   Review of Systems Please see HPI for pertinent positives and negatives  Physical Exam BP 120/82   Pulse 94   Temp 98.6 F (37 C) (Oral)   Resp 20   Ht 5' 8 (1.727 m)   Wt 117.9 kg   LMP 09/18/2022 (Approximate)   SpO2 100%   BMI 39.53 kg/m   Physical Exam Vitals and nursing note reviewed.  Constitutional:      Appearance: Normal appearance.  HENT:     Head: Normocephalic and atraumatic.     Nose: Nose normal.     Mouth/Throat:     Mouth: Mucous membranes are moist.  Eyes:     Extraocular Movements: Extraocular movements intact.     Conjunctiva/sclera: Conjunctivae normal.  Cardiovascular:     Rate and Rhythm: Normal rate.  Pulmonary:     Effort: Pulmonary effort is normal.     Breath sounds: Normal breath sounds. No wheezing or rales.  Abdominal:     General: Abdomen is flat.     Palpations: Abdomen is soft.     Tenderness: There is no abdominal tenderness. There is no guarding.  Musculoskeletal:        General: No swelling. Normal range of motion.     Cervical back: Neck supple.  Skin:    General: Skin is warm and dry.  Neurological:     General: No focal deficit  present.     Mental Status: She is alert.  Psychiatric:        Mood and Affect: Mood normal.     ED Results / Procedures / Treatments   EKG EKG Interpretation Date/Time:  Wednesday July 21 2023 19:40:58 EST Ventricular Rate:  114 PR Interval:  98 QRS Duration:  88 QT Interval:  338 QTC Calculation: 465 R Axis:   54  Text Interpretation: Sinus tachycardia with short PR Cannot rule out Inferior infarct , age undetermined Cannot rule out Anterior infarct , age undetermined Abnormal ECG When compared with ECG of 15-Apr-2023 05:07, PREVIOUS ECG IS PRESENT Rate faster Confirmed by Roselyn Dunnings [phone]) on 07/21/2023 11:40:47 PM  Procedures Procedures  Medications Ordered in the  ED Medications  albuterol  (VENTOLIN  HFA) 108 (90 Base) MCG/ACT inhaler 2 puff (has no administration in time range)    Initial Impression and Plan  Patient here with multiple chronic complaints. She recently found out there is black mold in her house. Her exam and vitals are reassuring. Labs done in triage show normal CBC, BMP, lipase and UA. Covid/Flu/RSV swab is neg. I personally viewed the images from radiology studies and agree with radiologist interpretation: CXR is clear. Patient advised no specific ED labs are indicated for black mold exposure. Recommend she continue with her usual medications, explore moving and/or cleaning the mold. PCP follow up, RTED for any other concerns.    ED Course       MDM Rules/Calculators/A&P Medical Decision Making Problems Addressed: Chronic abdominal pain: chronic illness or injury with exacerbation, progression, or side effects of treatment Chronic cough: chronic illness or injury with exacerbation, progression, or side effects of treatment  Amount and/or Complexity of Data Reviewed Labs: ordered. Decision-making details documented in ED Course. Radiology: ordered and independent interpretation performed. Decision-making details documented in ED  Course. ECG/medicine tests: ordered and independent interpretation performed. Decision-making details documented in ED Course.  Risk Prescription drug management.     Final Clinical Impression(s) / ED Diagnoses Final diagnoses:  Chronic abdominal pain  Chronic cough    Rx / DC Orders ED Discharge Orders     None        Roselyn Carlin NOVAK, MD 07/22/23 0111

## 2023-07-27 ENCOUNTER — Ambulatory Visit: Payer: Medicaid Other | Admitting: Obstetrics & Gynecology

## 2023-08-03 ENCOUNTER — Ambulatory Visit: Payer: Medicaid Other | Admitting: Obstetrics & Gynecology

## 2023-08-07 ENCOUNTER — Other Ambulatory Visit: Payer: Self-pay | Admitting: Internal Medicine

## 2023-08-07 DIAGNOSIS — M1380 Other specified arthritis, unspecified site: Secondary | ICD-10-CM

## 2023-08-24 ENCOUNTER — Ambulatory Visit: Payer: Medicaid Other | Admitting: Obstetrics and Gynecology

## 2023-08-24 ENCOUNTER — Other Ambulatory Visit (HOSPITAL_COMMUNITY)
Admission: RE | Admit: 2023-08-24 | Discharge: 2023-08-24 | Disposition: A | Discharge status: Home / Self Care | Payer: Medicaid Other | Source: Ambulatory Visit | Attending: Obstetrics and Gynecology | Admitting: Obstetrics and Gynecology

## 2023-08-24 VITALS — BP 107/74 | HR 65 | Wt 255.0 lb

## 2023-08-24 DIAGNOSIS — N911 Secondary amenorrhea: Secondary | ICD-10-CM

## 2023-08-24 DIAGNOSIS — N926 Irregular menstruation, unspecified: Secondary | ICD-10-CM | POA: Diagnosis not present

## 2023-08-24 DIAGNOSIS — Z113 Encounter for screening for infections with a predominantly sexual mode of transmission: Secondary | ICD-10-CM

## 2023-08-24 DIAGNOSIS — Z3202 Encounter for pregnancy test, result negative: Secondary | ICD-10-CM

## 2023-08-24 DIAGNOSIS — Z1151 Encounter for screening for human papillomavirus (HPV): Secondary | ICD-10-CM | POA: Diagnosis not present

## 2023-08-24 DIAGNOSIS — G8929 Other chronic pain: Secondary | ICD-10-CM | POA: Diagnosis not present

## 2023-08-24 DIAGNOSIS — Z124 Encounter for screening for malignant neoplasm of cervix: Secondary | ICD-10-CM | POA: Diagnosis present

## 2023-08-24 DIAGNOSIS — R102 Pelvic and perineal pain: Secondary | ICD-10-CM

## 2023-08-24 LAB — POCT URINE PREGNANCY: Preg Test, Ur: NEGATIVE

## 2023-08-24 MED ORDER — LIDOCAINE 5 % EX OINT: 1.0000 | TOPICAL_OINTMENT | CUTANEOUS | 0 refills | Status: AC | PRN

## 2023-08-24 MED ORDER — SLYND 4 MG PO TABS: 1.0000 | ORAL_TABLET | Freq: Every day | ORAL | 3 refills | Status: AC

## 2023-08-24 MED ORDER — MEDROXYPROGESTERONE ACETATE 10 MG PO TABS: 10.0000 mg | ORAL_TABLET | Freq: Every day | ORAL | 0 refills | Status: DC

## 2023-08-24 NOTE — Progress Notes (Signed)
 Pt here for evaluation of lower pelvic pain states pain is 7/10x today can be 10x. Takes IBP/Tylenol  no relief w/ either. Pt is constant.  Pt would like a new Rx for depression and mood swings. Current Rx are being prescribed by [facility]. Clayborne Molt.  Declines STD Screening.

## 2023-08-24 NOTE — Patient Instructions (Addendum)
   If you do not get a period within a week after stopping the provera, please let me know. Once your period starts, you can start the birth control pills (Slynd/drospironone)

## 2023-08-24 NOTE — Progress Notes (Signed)
 RETURN GYNECOLOGY VISIT  Subjective:  Jacqueline Gonzalez is a 23 y.o. G0P0000 with LMP 09/2022 with PCOS presenting for follow up of chronic abdominal pain  Pt has longstanding history of pelvic & lower abdominal pain, irregular periods (PCOS) for which she has been following with Dr. Izell. She has tried PFPT without success. Diagnostic laparoscopy on 02/10/23 essentially normal aside from mild filmy adhesions on right/lateral abdominal wall to the large bowel. Photos are available for review.  She was seen in the ED 07/07/23 and 07/22/23 with flares of her pain. She has her first appointment with GI tomorrow (2/5).  Today she reports that her pain has been worse for the past 1-3 months. Pain is a 7 out of 10 at baseline and fluctuates/flares to a 10 out of 10. Pain is sharp and constant. Reports nausea, intermittent vomiting, and constipation. Also notes worsening dyspareunia. Uses lubricant on both her and her partner and reapplies as needed. Denies fevers or abnormal discharge.   Has tried several different medications for her chronic pain that she feels haven't helped. Hasn't taken tylenol  or ibuprofen  because they haven't helped in the past.   Has not had a period in 11 months. Known PCOS.   I personally reviewed the following: - Op note & intraoperative photos from Dr. Izell on 02/10/23 - UA 07/21/23 small LE, negative nitrite, negative blood, 6-10 squams - UPT negative 07/07/23 - Wet prep 03/30/23 +clue cells - GC/CT negative 03/30/23 - Pap 08/18/22 LSIL/HPV neg - CT A/P 03/30/23 no acute findings in abdomen/pelvis - Pelvic US  08/25/22 Normal uterus, multiple small follicles c/f PCOS  Objective:   Vitals:   08/24/23 1402  BP: 107/74  Pulse: 65  Weight: 255 lb (115.7 kg)    General:  Alert, oriented and cooperative. Patient is in no acute distress.  Skin: Skin is warm and dry. No rash noted.   Cardiovascular: Normal heart rate noted  Respiratory: Normal respiratory effort, no  problems with respiration noted  Abdomen: Soft, diffusely tender with light palpation but most notable in lower portions of abdomen, non-distended, no rebound tenderness  Pelvic: NEFG. Mild vestibular tenderness noted with Q tip at 6 o'clock. Tenderness of urethra/anterior vagina, levators and obturators. No CMT, uterine or adnexal tenderness   Exam performed in the presence of a chaperone  Assessment and Plan:  [name] is a 23 y.o. with the following:  Chronic pelvic pain in female - No e/o PID/torsion/ectopic to explain flare in her pain. 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 -     medroxyPROGESTERone  (PROVERA ) 10 MG tablet; Take 1 tablet (10 mg total) by mouth daily. Use  for ten days  Cervical cancer screening -     Cytology - PAP  Routine screening for STI (sexually transmitted infection) -     HCV Ab w Reflex to Quant PCR -     HIV antibody (with reflex) -     Hepatitis B Surface AntiGEN -     RPR  Return in about 1 year (around 08/23/2024) for annual exam or sooner as needed.  Future Appointments  Date Time Provider Department Center  08/25/2023 11:00 AM Mollie Nestor CHRISTELLA DEVONNA [facility]  10/07/2023 11:00 AM Jeannetta, Lonni ORN, MD [facility] None    Kieth JAYSON Carolin, MD

## 2023-08-25 ENCOUNTER — Ambulatory Visit: Payer: Medicaid Other | Admitting: Gastroenterology

## 2023-08-25 ENCOUNTER — Encounter: Payer: Self-pay | Admitting: Gastroenterology

## 2023-08-25 ENCOUNTER — Encounter: Payer: Self-pay | Admitting: Obstetrics and Gynecology

## 2023-08-25 VITALS — BP 122/72 | HR 80 | Ht 68.0 in | Wt 256.4 lb

## 2023-08-25 DIAGNOSIS — K219 Gastro-esophageal reflux disease without esophagitis: Secondary | ICD-10-CM

## 2023-08-25 DIAGNOSIS — G8929 Other chronic pain: Secondary | ICD-10-CM

## 2023-08-25 DIAGNOSIS — R109 Unspecified abdominal pain: Secondary | ICD-10-CM | POA: Diagnosis not present

## 2023-08-25 DIAGNOSIS — R102 Pelvic and perineal pain: Secondary | ICD-10-CM | POA: Diagnosis not present

## 2023-08-25 DIAGNOSIS — R110 Nausea: Secondary | ICD-10-CM | POA: Diagnosis not present

## 2023-08-25 DIAGNOSIS — K582 Mixed irritable bowel syndrome: Secondary | ICD-10-CM

## 2023-08-25 LAB — RPR: RPR Ser Ql: NONREACTIVE

## 2023-08-25 LAB — HIV ANTIBODY (ROUTINE TESTING W REFLEX): HIV Screen 4th Generation wRfx: NONREACTIVE

## 2023-08-25 LAB — FOLLICLE STIMULATING HORMONE: FSH: 5.5 m[IU]/mL

## 2023-08-25 LAB — CBC
Hematocrit: 35.5 % (ref 34.0–46.6)
Hemoglobin: 11.1 g/dL (ref 11.1–15.9)
MCH: 26.2 pg — ABNORMAL LOW (ref 26.6–33.0)
MCHC: 31.3 g/dL — ABNORMAL LOW (ref 31.5–35.7)
MCV: 84 fL (ref 79–97)
Platelets: 276 10*3/uL (ref 150–450)
RBC: 4.23 x10E6/uL (ref 3.77–5.28)
RDW: 13.8 % (ref 11.7–15.4)
WBC: 7 10*3/uL (ref 3.4–10.8)

## 2023-08-25 LAB — TSH RFX ON ABNORMAL TO FREE T4: TSH: 1.54 u[IU]/mL (ref 0.450–4.500)

## 2023-08-25 LAB — HCV INTERPRETATION

## 2023-08-25 LAB — HCV AB W REFLEX TO QUANT PCR: HCV Ab: NONREACTIVE

## 2023-08-25 LAB — TESTOSTERONE: Testosterone: 41 ng/dL (ref 13–71)

## 2023-08-25 LAB — HEPATITIS B SURFACE ANTIGEN: Hepatitis B Surface Ag: NEGATIVE

## 2023-08-25 LAB — PROLACTIN: Prolactin: 15 ng/mL (ref 4.8–33.4)

## 2023-08-25 LAB — ESTRADIOL: Estradiol: 42.2 pg/mL

## 2023-08-25 MED ORDER — FAMOTIDINE 40 MG PO TABS: 40.0000 mg | ORAL_TABLET | Freq: Every day | ORAL | 3 refills | Status: DC

## 2023-08-25 NOTE — Progress Notes (Signed)
 Chief Complaint: Chronic abdominal pain Primary GI MD: Dr. Stacia  HPI: 23-year-old female history of anxiety, depression, IBS, presents for evaluation of Chronic abdominal pain  Last seen by Dr. Stacia May 2024  August 2023: OV with Delon Failing with constipation and BRBPR. Anal fissure on exam. Linzess 72mcg stopped due to diarrhea --> Amitiza  8mcg BID.  Given hyoscyamine  for abdominal pain and diltiazem  gel for fissure Aug 21-28 2023: Admitted for pelvic sepsis secondary to chlamydia PID and proctocolitis with perirectal abscess treated with ceftriaxone , Flagyl , doxycycline .  S/p I&D of supra alleviator rectal abscess 8/26 with moderate proctitis.  8/25 MRI of pelvis without contrast showed progressive rectal wall thickening suspicious for infectious/inflammatory proctocolitis. 04/16/2022: ED for abdominal pain with CT showing resolution of inflammatory changes in rectum October 2023: OV for diarrhea, nausea, headache, vomiting, generalized abdominal pain.  EGD and colonoscopy with Dr. Federico was unremarkable 09/24/2022: OV for ongoing abdominal pain.  Hyoscyamine  4 times daily and PPI changed from omeprazole  to pantoprazole  with as needed famotidine  11/25/2022: OV with continued abdominal pain, constipation.  Unable to start TCA due to history of allergic reaction.  Recommended follow-up with behavioral health to discuss treatment for depression/anxiety in addition to IBS such as Cymbalta .  Recommended FODMAP first gluten-free diet 01/2023: diagnostic laparoscopy for chronic pelvic pain with OBGYN: Only finding was adhesion few centimeters above right pelvic brim with filmy adhesions from the posterior abdominal wall to large bowel.  Patient had continued pain postsurgery  Patient has had several visits to ED for various reasons including dental pain and chronic abdominal pain over the last 6 months.  Most recent imaging 03/30/2023 CT abdomen pelvis with contrast showed no acute  abnormality  Patient seen by OB/GYN yesterday for chronic pelvic pain.  No relief with IBP/Tylenol  Labs yesterday with unrevealing CBC  She experiences chronic abdominal and lower pelvic pain that is constant with fluctuations in intensity. The pain is exacerbated by activities such as eating, bowel movements, and walking. Despite treatment with hyoscyamine  and Tylenol , the pain persists daily and can increase in severity.  She has significant difficulty with eating due to pain and associated symptoms of acid reflux and nausea. She feels as though she might vomit after eating and is consistently nauseous. She is currently taking pantoprazole  twice daily and was previously on famotidine  but no longer taking.  Her past medical workup includes a CT scan and colonoscopy, both of which were normal. She has also undergone diagnostic laparoscopic surgery with her OBGYN. Despite these evaluations, her symptoms persist.  She has not tried Cymbalta  as previously recommended. Feels low FODMAP was not helpful. She continues to see a psychiatrist, with her last visit occurring a week ago and the next scheduled in one to two months.   PREVIOUS GI WORKUP   03/30/2023 CT abdomen pelvis with contrast showed no acute abnormality  Diagnostic laparoscopic for chronic pelvic pain 01/2023 Exam under anesthesia showed normal EGBUS, vagina and cervix; patient sounded to 8cm and bimanual exam negative. On diagnostic laparoscopy, normal appearing, small, mid plane uterus; normal appearing tubes; normal ovaries with a tiny 5mm right para-tubal cyst; normal anterior and posterior cul de sacs; normal bilateral para-ovarian fossae. Also, normal appearing liver edge, and stomach edge and normal appearing appendix. Ureters also seen and traced down into the pelvis bilaterally.   EGD May 04, 2022 - Normal esophagus. Biopsied. - Gastritis. Biopsied. - Normal examined duodenum. Biopsied.   Colonoscopy May 04, 2022 - The  examined portion of the  ileum was normal.  - Biopsies were taken with a cold forceps from the entire colon for evaluation of microscopic colitis.  - Non-bleeding internal hemorrhoids.  - Small anal fissure found on perianal exam.   CT abdomen w/ contrast Sept 28, 2023 IMPRESSION: Normal enhanced CT scan of the abdomen and pelvis.   Pelvic US  Aug 25, 2022 IMPRESSION: Findings suggest polycystic ovary syndrome.  Otherwise unremarkable  Past Medical History:  Diagnosis Date   Anemia    Anxiety    Arthritis    knees   Chlamydia 11/13/2021   Constipation, chronic    Depression    Dyspnea    ON OCC   Environmental allergies    Ganglion cyst of dorsum of left wrist    RECURRENT   GERD (gastroesophageal reflux disease)    watches diet   Headache MIGRAINE    History of hypertension    IN PAST, NO RECENT HTN PER PT 01-19-2023   IBS (irritable bowel syndrome)    Lactose intolerance    PCOS (polycystic ovarian syndrome)    Pre-diabetes     Past Surgical History:  Procedure Laterality Date   GANGLION CYST EXCISION Left 04/24/2021   Procedure: left recurrent dorsal carpal ganglion excision;  Surgeon: Alyse Agent, MD;  Location: [facility];  Service: Orthopedics;  Laterality: Left;   INCISION AND DRAINAGE PERIRECTAL ABSCESS N/A 03/14/2022   Procedure: IRRIGATION AND DEBRIDEMENT PERIRECTAL ABSCESS;  Surgeon: Sheldon Standing, MD;  Location: [facility];  Service: General;  Laterality: N/A;   LAPAROSCOPY N/A 02/10/2023   Procedure: LAPAROSCOPY DIAGNOSTIC;  Surgeon: Izell Harari, MD;  Location: [facility];  Service: Gynecology;  Laterality: N/A;   TONSILLECTOMY AND ADENOIDECTOMY     AGE 31   WISDOM TOOTH EXTRACTION  03/18/2021   WRIST GANGLION EXCISION Left 02/2020    Current Outpatient Medications  Medication Sig Dispense Refill   albuterol  (VENTOLIN  HFA) 108 (90 Base) MCG/ACT inhaler Inhale into the lungs every 6 (six) hours as needed for wheezing or  shortness of breath.     buPROPion (WELLBUTRIN XL) 150 MG 24 hr tablet Take by mouth.     cetirizine  (ZYRTEC ) 10 MG tablet Take 1 tablet (10 mg total) by mouth daily. 30 tablet 0   Drospirenone  (SLYND ) 4 MG TABS Take 1 tablet (4 mg total) by mouth daily. 90 tablet 3   famotidine  (PEPCID ) 40 MG tablet Take 1 tablet (40 mg total) by mouth at bedtime. 30 tablet 3   FLUoxetine  (PROZAC ) 20 MG capsule Take 60 mg by mouth every morning.     fluticasone  (FLONASE ) 50 MCG/ACT nasal spray Place 2 sprays into both nostrils daily. 16 g 0   gabapentin  (NEURONTIN ) 300 MG capsule Take 300 mg by mouth 3 (three) times daily.     hydroxychloroquine  (PLAQUENIL ) 200 MG tablet Take 200 mg by mouth daily.     hydrOXYzine  (VISTARIL ) 25 MG capsule Take 25 mg by mouth 2 (two) times daily as needed.     ibuprofen  (ADVIL ) 600 MG tablet Take 1 tablet (600 mg total) by mouth every 6 (six) hours as needed. 30 tablet 0   lidocaine  (XYLOCAINE ) 5 % ointment Apply 1 Application topically as needed. 35 g 0   medroxyPROGESTERone  (PROVERA ) 10 MG tablet Take 1 tablet (10 mg total) by mouth daily. Use for ten days 10 tablet 0   ondansetron  (ZOFRAN ) 4 MG tablet Take 4 mg by mouth 2 (two) times daily as needed.     pantoprazole  (PROTONIX )  40 MG tablet Take 1 tablet (40 mg total) by mouth 2 (two) times daily before a meal. 60 tablet 6   propranolol (INDERAL) 60 MG tablet Take 60 mg by mouth 2 (two) times daily.     tiZANidine (ZANAFLEX) 4 MG tablet Take 4 mg by mouth 2 (two) times daily as needed.     Vitamin D , Ergocalciferol , (DRISDOL) 1.25 MG (50000 UNIT) CAPS capsule Take 50,000 Units by mouth every 7 (seven) days.     VRAYLAR 1.5 MG capsule Take 1.5 mg by mouth daily.     ADVAIR DISKUS 100-50 MCG/ACT AEPB Inhale 1 puff into the lungs 2 (two) times daily. (Patient not taking: Reported on 07/09/2023)     clindamycin  (CLEOCIN ) 300 MG capsule Take 1 capsule (300 mg total) by mouth 3 (three) times daily. (Patient not taking: Reported on  08/25/2023) 30 capsule 0   DTx App - Gastrointestinal [facility] IBS) MISC Use as directed (Patient not taking: Reported on 04/12/2023) 11 each 1   EMGALITY 120 MG/ML SOAJ Inject into the skin. (Patient not taking: Reported on 04/12/2023)     fluconazole  (DIFLUCAN ) 150 MG tablet Take 1 tablet (150 mg total) by mouth daily. (Patient not taking: Reported on 08/25/2023) 1 tablet 0   FLUoxetine  (PROZAC ) 40 MG capsule Take 1 capsule (40 mg total) by mouth daily. (Patient not taking: Reported on 08/25/2023) 90 capsule 3   gabapentin  (NEURONTIN ) 100 MG capsule Take 1 capsule (100 mg total) by mouth 3 (three) times daily. (Patient not taking: Reported on 07/09/2023) 30 capsule 0   Galcanezumab-gnlm (EMGALITY [place]) Inject 1 Dose into the skin every 30 (thirty) days. (Patient not taking: Reported on 08/25/2023)     oxyCODONE -acetaminophen  (PERCOCET/ROXICET) 5-325 MG tablet Take 1 tablet by mouth every 6 (six) hours as needed. (Patient not taking: Reported on 08/25/2023) 8 tablet 0   pregabalin (LYRICA) 50 MG capsule Take 50 mg by mouth 2 (two) times daily. (Patient not taking: Reported on 07/09/2023)     Psyllium (METAMUCIL) 48.57 % POWD 1 spoonful daily for the next 14 days (Patient not taking: Reported on 08/25/2023)     No current facility-administered medications for this visit.    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   Shellfish allergy Anaphylaxis 06/17/2016   Amitriptyline  Other (See Comments) 03/10/2022   Sumatriptan  Swelling 02/10/2023    Family History  Problem Relation Age of Onset   Heart failure Mother    Migraines Mother    Irritable bowel syndrome Mother    Other Mother 44       Tricuspid valve regurgitation   Asthma Father    Hypertension Maternal Grandmother    Diabetes Maternal Grandmother    Hypertension  Maternal Grandfather    Diabetes Maternal Grandfather    Hypertension Paternal Grandmother    Stroke Paternal Grandmother    Diabetes Paternal Grandmother    Hypertension Paternal Grandfather    Diabetes Paternal Grandfather    Kidney disease Paternal Grandfather    Ovarian cancer Maternal Aunt    Graves' disease Paternal Aunt    Stomach cancer Neg Hx    Esophageal cancer Neg Hx    Colon cancer Neg Hx     Social History   Socioeconomic History   Marital status: Single    Spouse name: Not on file   Number of children: 0  Years of education: Not on file   Highest education level: Not on file  Occupational History   Occupation: student  Tobacco Use   Smoking status: Never    Passive exposure: Past   Smokeless tobacco: Never  Vaping Use   Vaping status: Never Used  Substance and Sexual Activity   Alcohol use: Yes    Comment: occ   Drug use: Never    Comment: Smokes CBD   Sexual activity: Yes    Birth control/protection: OCP  Other Topics Concern   Not on file  Social History Narrative   Not on file   Social Drivers of Health   Financial Resource Strain: Not on file  Food Insecurity: Not on file  Transportation Needs: Not on file  Physical Activity: Not on file  Stress: No Stress Concern Present (07/04/2021)   [employer]ational [employer] - Occupational Stress Questionnaire    Feeling of Stress : Only a little  Recent Concern: Stress - Stress Concern Present (06/20/2021)   [employer] of Occupational [employer] - Occupational Stress Questionnaire    Feeling of Stress : To some extent  Social Connections: Unknown (03/31/2023)   [facility]   Social Network    Social Network: Not on file  Intimate Partner Violence: Unknown (03/31/2023)   Received from [facility]   HITS    Physically Hurt: Not on file    Insult or Talk Down To: Not on file    Threaten Physical Harm: Not on file    Scream or Curse: Not on file    Review of  Systems:    Constitutional: No weight loss, fever, chills, weakness or fatigue HEENT: Eyes: No change in vision               Ears, Nose, Throat:  No change in hearing or congestion Skin: No rash or itching Cardiovascular: No chest pain, chest pressure or palpitations   Respiratory: No SOB or cough Gastrointestinal: See HPI and otherwise negative Genitourinary: No dysuria or change in urinary frequency Neurological: No headache, dizziness or syncope Musculoskeletal: No new muscle or joint pain Hematologic: No bleeding or bruising Psychiatric: No history of depression or anxiety    Physical Exam:  Vital signs: BP 122/72 (BP Location: Left Arm, Patient Position: Sitting, Cuff Size: Large)   Pulse 80   Ht 5' 8 (1.727 m)   Wt 256 lb 6 oz (116.3 kg)   LMP 09/18/2022   BMI 38.98 kg/m   Constitutional: NAD, Well developed, Well nourished, alert and cooperative Head:  Normocephalic and atraumatic. Eyes:   PEERL, EOMI. No icterus. Conjunctiva pink. Respiratory: Respirations even and unlabored. Lungs clear to auscultation bilaterally.   No wheezes, crackles, or rhonchi.  Cardiovascular:  Regular rate and rhythm. No peripheral edema, cyanosis or pallor.  Gastrointestinal:  Soft, nondistended, generalized tenderness. No rebound or guarding. Normal bowel sounds. No appreciable masses or hepatomegaly. Rectal:  Not performed.  Msk:  Symmetrical without gross deformities. Without edema, no deformity or joint abnormality.  Neurologic:  Alert and  oriented x4;  grossly normal neurologically.  Skin:   Dry and intact without significant lesions or rashes. Psychiatric: Oriented to person, place and time. Demonstrates good judgement and reason without abnormal affect or behaviors.  RELEVANT LABS AND IMAGING: CBC    Component Value Date/Time   WBC 7.0 08/24/2023 1434   WBC 6.0 07/21/2023 1931   RBC 4.23 08/24/2023 1434   RBC 4.41 07/21/2023 1931   HGB 11.1 08/24/2023  1434   HCT 35.5  08/24/2023 1434   PLT 276 08/24/2023 1434   MCV 84 08/24/2023 1434   MCH 26.2 (L) 08/24/2023 1434   MCH 26.5 07/21/2023 1931   MCHC 31.3 (L) 08/24/2023 1434   MCHC 32.0 07/21/2023 1931   RDW 13.8 08/24/2023 1434   LYMPHSABS 2.8 06/26/2023 1028   MONOABS 0.5 06/26/2023 1028   EOSABS 0.1 06/26/2023 1028   BASOSABS 0.0 06/26/2023 1028    CMP     Component Value Date/Time   NA 136 07/21/2023 1931   NA 138 08/28/2020 1600   K 3.7 07/21/2023 1931   CL 105 07/21/2023 1931   CO2 22 07/21/2023 1931   GLUCOSE 106 (H) 07/21/2023 1931   BUN 6 07/21/2023 1931   BUN 6 08/28/2020 1600   CREATININE 0.68 07/21/2023 1931   CALCIUM 9.0 07/21/2023 1931   PROT 7.3 07/21/2023 1931   PROT 7.6 08/28/2020 1600   ALBUMIN 4.4 07/21/2023 1931   ALBUMIN 4.3 08/28/2020 1600   AST 16 07/21/2023 1931   ALT 15 07/21/2023 1931   ALKPHOS 67 07/21/2023 1931   BILITOT 0.3 07/21/2023 1931   BILITOT 0.2 08/28/2020 1600   GFRNONAA >60 07/21/2023 1931   GFRAA 147 08/28/2020 1600     Assessment/Plan:      Chronic Abdominal and Pelvic Pain Constant baseline pain with fluctuations in intensity.  No consistent identifiable trigger.  Extensive negative workup including multiple CTs, EGD/colonoscopy, diagnostic laparoscopy with OB/GYN have all been unrevealing for source of pain and multiple medications including hyoscyamine , narcotics, anti-inflammatories with no relief. 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  She was recommended to try Cymbalta  at last appointment, this is to be prescribed by psychiatrist due to previous reaction to TCA. --Provide samples of IBgard for potential relief of abdominal spasms. -- Discussed importance of getting in touch with psychiatrist to discuss possibility of Cymbalta  - Follow-up 10-12 weeks  Gastroesophageal Reflux Disease (GERD) Nausea Patient reports difficulty eating due to severe acid reflux and nausea despite taking Pantoprazole  twice daily.  Recent EGD unrevealing --Continue Pantoprazole  40mg  twice daily. --Add Famotidine  40mg  at bedtime to further manage reflux symptoms. --Advise patient to avoid lying flat after eating to prevent reflux. -- educated patient on lifestyle modifications  Follow-up in 12 weeks to assess symptom management and medication effectiveness.       This visit required 38 minutes of patient care (this includes precharting, chart review, review of results, face-to-face time used for counseling as well as treatment plan and follow-up. The patient was provided an opportunity to ask questions and all were answered. The patient agreed with the plan and demonstrated an understanding of the instructions.   Nestor Mollie RIGGERS  Gastroenterology 08/25/2023, 12:09 PM  Cc: Tann, Samandra, NP

## 2023-08-25 NOTE — Patient Instructions (Signed)
 We have sent the following medications to your pharmacy for you to pick up at your convenience:  Famotidine  40mg  daily at bedtime  Ibgard as needed  _______________________________________________________  If your blood pressure at your visit was 140/90 or greater, please contact your primary care physician to follow up on this.  _______________________________________________________  If you are age 23 or older, your body mass index should be between 23-30. Your Body mass index is 38.98 kg/m. If this is out of the aforementioned range listed, please consider follow up with your Primary Care Provider.  If you are age 63 or younger, your body mass index should be between 19-25. Your Body mass index is 38.98 kg/m. If this is out of the aformentioned range listed, please consider follow up with your Primary Care Provider.   ________________________________________________________  The [facility] providers would like to encourage you to use MYCHART to communicate with providers for non-urgent requests or questions.  Due to long hold times on the telephone, sending your provider a message by [facility] may be a faster and more efficient way to get a response.  Please allow 48 business hours for a response.  Please remember that this is for non-urgent requests.  _______________________________________________________  Thank you for trusting me with your gastrointestinal care!   Nestor Blower, PA

## 2023-08-30 NOTE — Progress Notes (Signed)
 Agree with the assessment and plan as outlined by Nestor Blower, PA-C.   Patient has multiple chronic pains (abdominal, pelvic, MSK, migraines).  She would likely benefit from Cymbalta  or similar medication which can help reduce the pain signals.  Consider alternative therapies such as accupuncture as well.  The [facility] App could also be considered, but requires significant patient cooperation.

## 2023-09-01 ENCOUNTER — Encounter: Payer: Self-pay | Admitting: Obstetrics and Gynecology

## 2023-09-01 ENCOUNTER — Emergency Department (HOSPITAL_COMMUNITY)
Admission: EM | Admit: 2023-09-01 | Discharge: 2023-09-01 | Disposition: A | Discharge status: Home / Self Care | Payer: Medicaid Other | Attending: Emergency Medicine | Admitting: Emergency Medicine

## 2023-09-01 DIAGNOSIS — R1110 Vomiting, unspecified: Secondary | ICD-10-CM | POA: Insufficient documentation

## 2023-09-01 DIAGNOSIS — Z5321 Procedure and treatment not carried out due to patient leaving prior to being seen by health care provider: Secondary | ICD-10-CM | POA: Insufficient documentation

## 2023-09-01 DIAGNOSIS — R109 Unspecified abdominal pain: Secondary | ICD-10-CM | POA: Insufficient documentation

## 2023-09-01 NOTE — ED Triage Notes (Signed)
Pt Jacqueline Gonzalez. Reports her dog kicked her in the stomach this morning. Reports abdominal pain since. States she had some bleed in her stool and was vomiting. Hx of stomach issues. Constipated for 2 days but was able to have a BM prior to leaving the scene. No vomiting in route

## 2023-09-02 LAB — CYTOLOGY - PAP
Chlamydia: NEGATIVE
Comment: NEGATIVE
Comment: NORMAL
Neisseria Gonorrhea: NEGATIVE

## 2023-09-03 ENCOUNTER — Encounter: Payer: Self-pay | Admitting: Obstetrics and Gynecology

## 2023-09-03 ENCOUNTER — Emergency Department (HOSPITAL_BASED_OUTPATIENT_CLINIC_OR_DEPARTMENT_OTHER): Payer: Medicaid Other | Admitting: Radiology

## 2023-09-03 ENCOUNTER — Emergency Department (HOSPITAL_BASED_OUTPATIENT_CLINIC_OR_DEPARTMENT_OTHER)
Admission: EM | Admit: 2023-09-03 | Discharge: 2023-09-04 | Disposition: A | Discharge status: Home / Self Care | Payer: Medicaid Other | Attending: Emergency Medicine | Admitting: Emergency Medicine

## 2023-09-03 ENCOUNTER — Encounter (HOSPITAL_BASED_OUTPATIENT_CLINIC_OR_DEPARTMENT_OTHER): Payer: Self-pay

## 2023-09-03 DIAGNOSIS — R0789 Other chest pain: Secondary | ICD-10-CM | POA: Diagnosis not present

## 2023-09-03 DIAGNOSIS — R079 Chest pain, unspecified: Secondary | ICD-10-CM

## 2023-09-03 DIAGNOSIS — R1084 Generalized abdominal pain: Secondary | ICD-10-CM | POA: Insufficient documentation

## 2023-09-03 LAB — URINALYSIS, ROUTINE W REFLEX MICROSCOPIC
Bacteria, UA: NONE SEEN
Bilirubin Urine: NEGATIVE
Glucose, UA: NEGATIVE mg/dL
Hgb urine dipstick: NEGATIVE
Ketones, ur: NEGATIVE mg/dL
Nitrite: NEGATIVE
Protein, ur: NEGATIVE mg/dL
Specific Gravity, Urine: 1.018 (ref 1.005–1.030)
pH: 6.5 (ref 5.0–8.0)

## 2023-09-03 LAB — COMPREHENSIVE METABOLIC PANEL
ALT: 13 U/L (ref 0–44)
AST: 12 U/L — ABNORMAL LOW (ref 15–41)
Albumin: 4.1 g/dL (ref 3.5–5.0)
Alkaline Phosphatase: 68 U/L (ref 38–126)
Anion gap: 9 (ref 5–15)
BUN: 7 mg/dL (ref 6–20)
CO2: 25 mmol/L (ref 22–32)
Calcium: 9.1 mg/dL (ref 8.9–10.3)
Chloride: 103 mmol/L (ref 98–111)
Creatinine, Ser: 0.72 mg/dL (ref 0.44–1.00)
GFR, Estimated: >60 mL/min (ref >60)
Glucose, Bld: 119 mg/dL — ABNORMAL HIGH (ref 70–99)
Potassium: 3.8 mmol/L (ref 3.5–5.1)
Sodium: 137 mmol/L (ref 135–145)
Total Bilirubin: 0.3 mg/dL (ref 0.0–1.2)
Total Protein: 7.8 g/dL (ref 6.5–8.1)

## 2023-09-03 LAB — CBC
HCT: 37 % (ref 36.0–46.0)
Hemoglobin: 11.6 g/dL — ABNORMAL LOW (ref 12.0–15.0)
MCH: 26.9 pg (ref 26.0–34.0)
MCHC: 31.4 g/dL (ref 30.0–36.0)
MCV: 85.8 fL (ref 80.0–100.0)
Platelets: 268 10*3/uL (ref 150–400)
RBC: 4.31 MIL/uL (ref 3.87–5.11)
RDW: 13.2 % (ref 11.5–15.5)
WBC: 5.6 10*3/uL (ref 4.0–10.5)
nRBC: 0 % (ref 0.0–0.2)

## 2023-09-03 LAB — LIPASE, BLOOD: Lipase: <10 U/L — ABNORMAL LOW (ref 11–51)

## 2023-09-03 LAB — TROPONIN I (HIGH SENSITIVITY): Troponin I (High Sensitivity): <2 ng/L (ref <18)

## 2023-09-03 LAB — PREGNANCY, URINE: Preg Test, Ur: NEGATIVE

## 2023-09-03 IMAGING — CR DG LUMBAR SPINE COMPLETE 4+V
5 series · 5 of 5 positions shown · non-contrast
Comparison: None.

CLINICAL DATA: Chronic back pain

EXAM:
LUMBAR SPINE - COMPLETE 4+ VIEW

[l-spine ap]
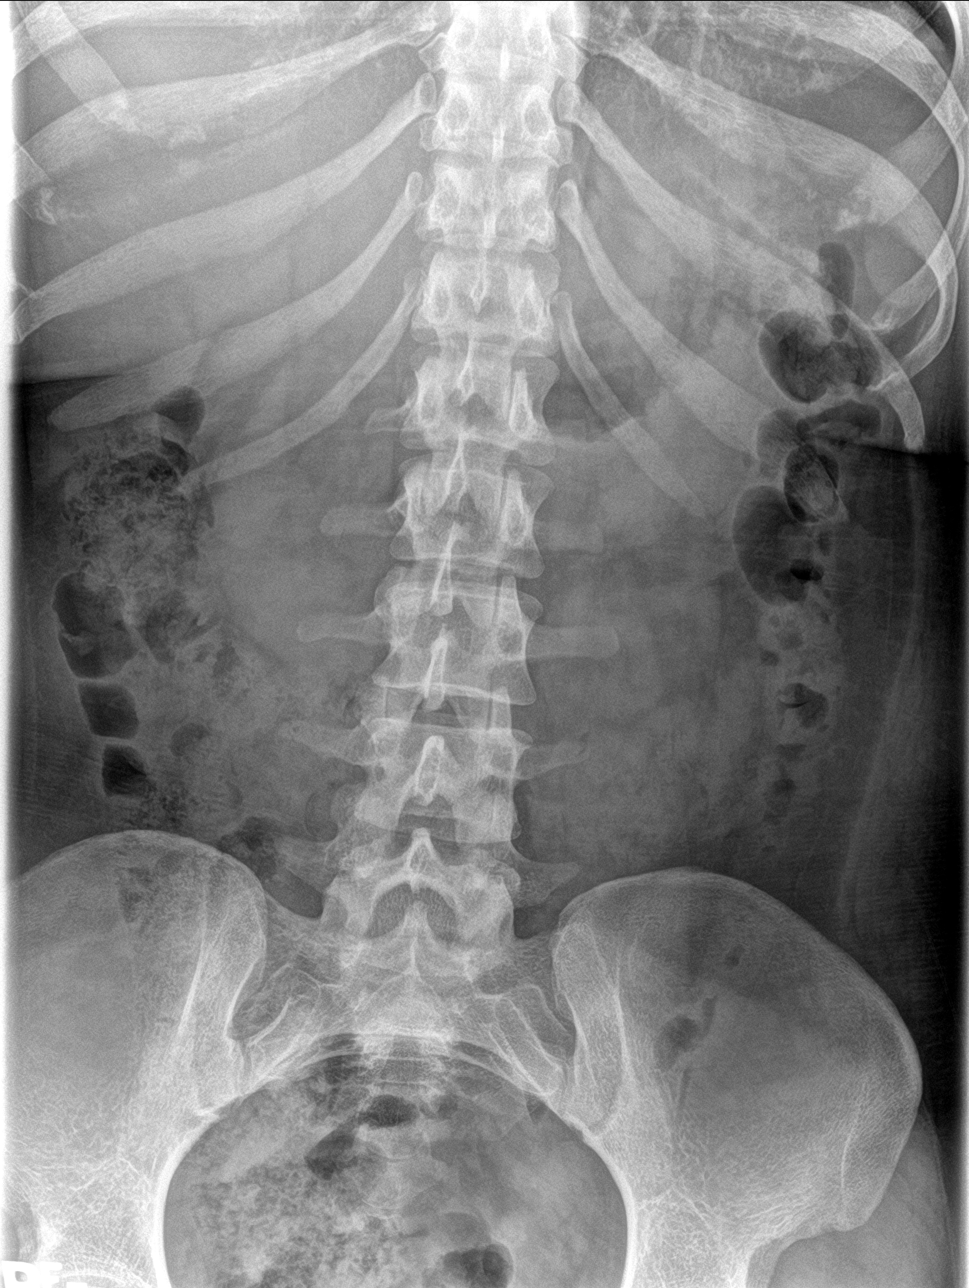

[l-spine obl (1 of 2)]
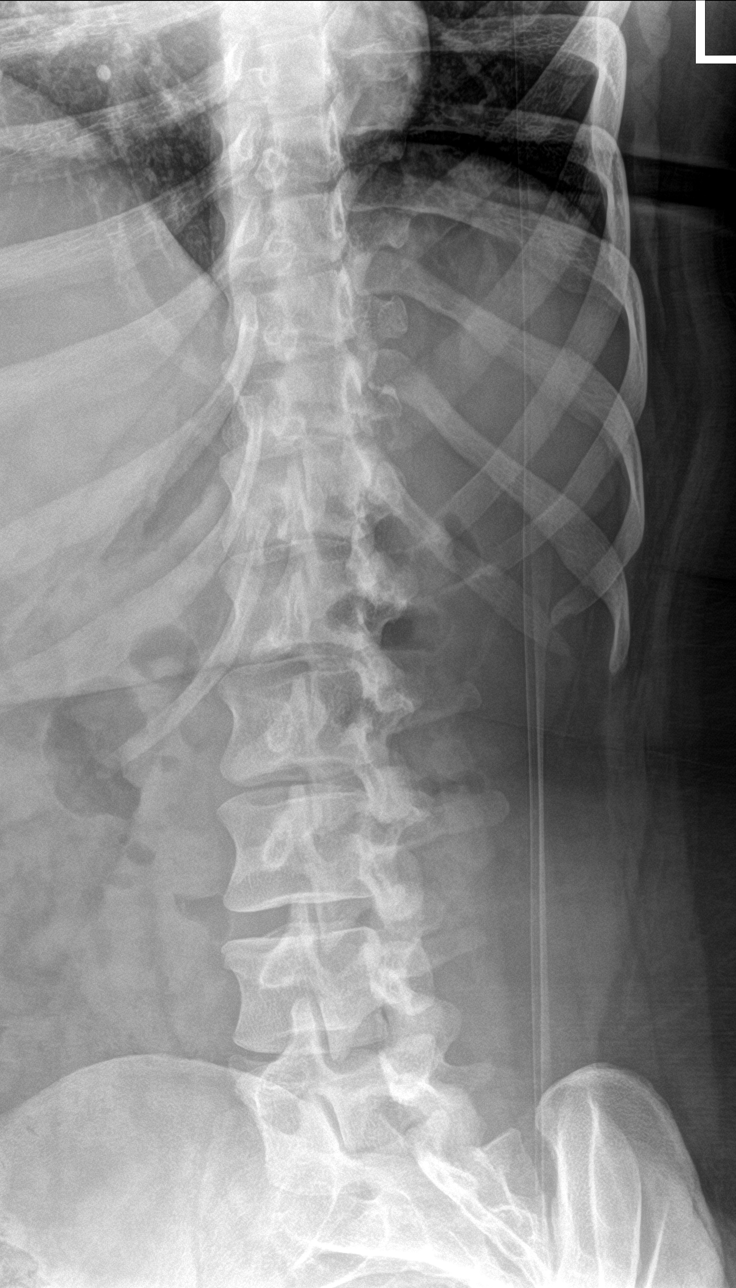

[l-spine obl (2 of 2)]
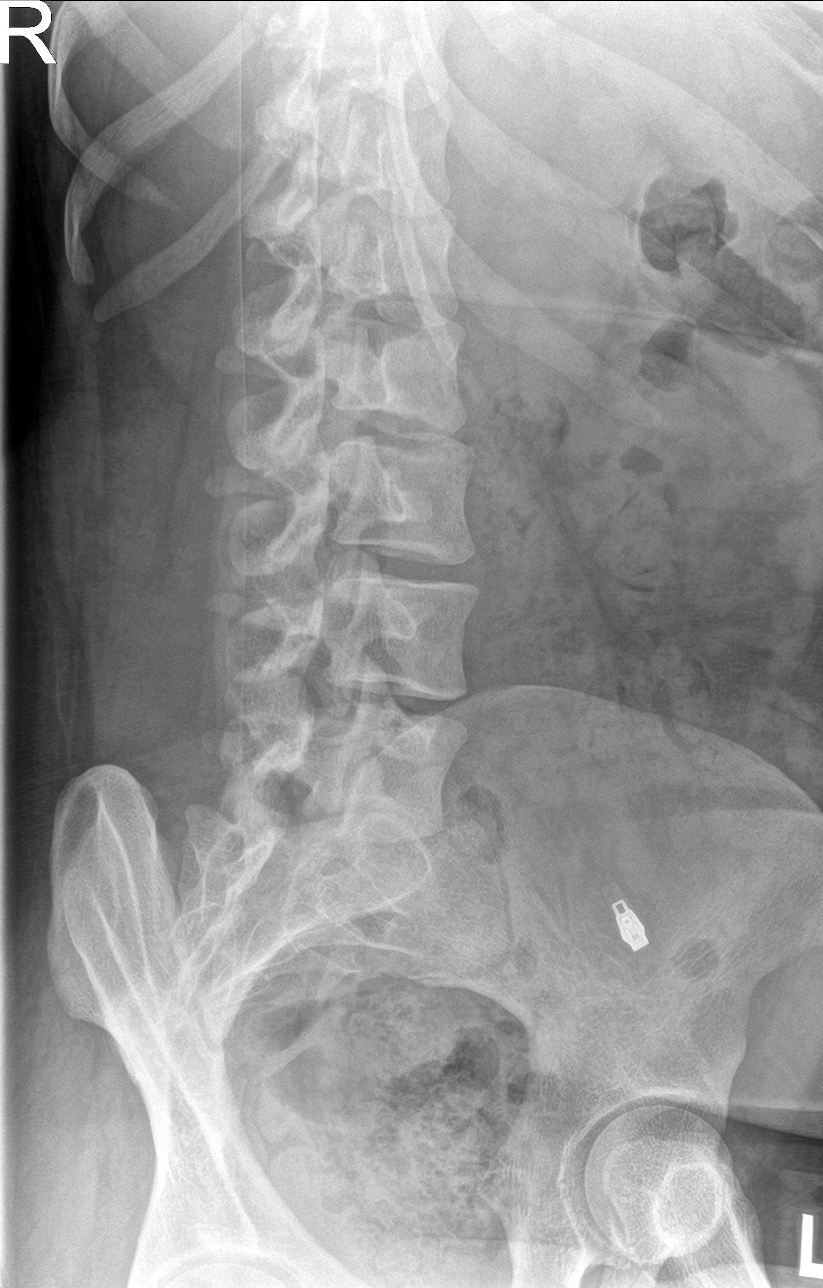

[l-spine spot]
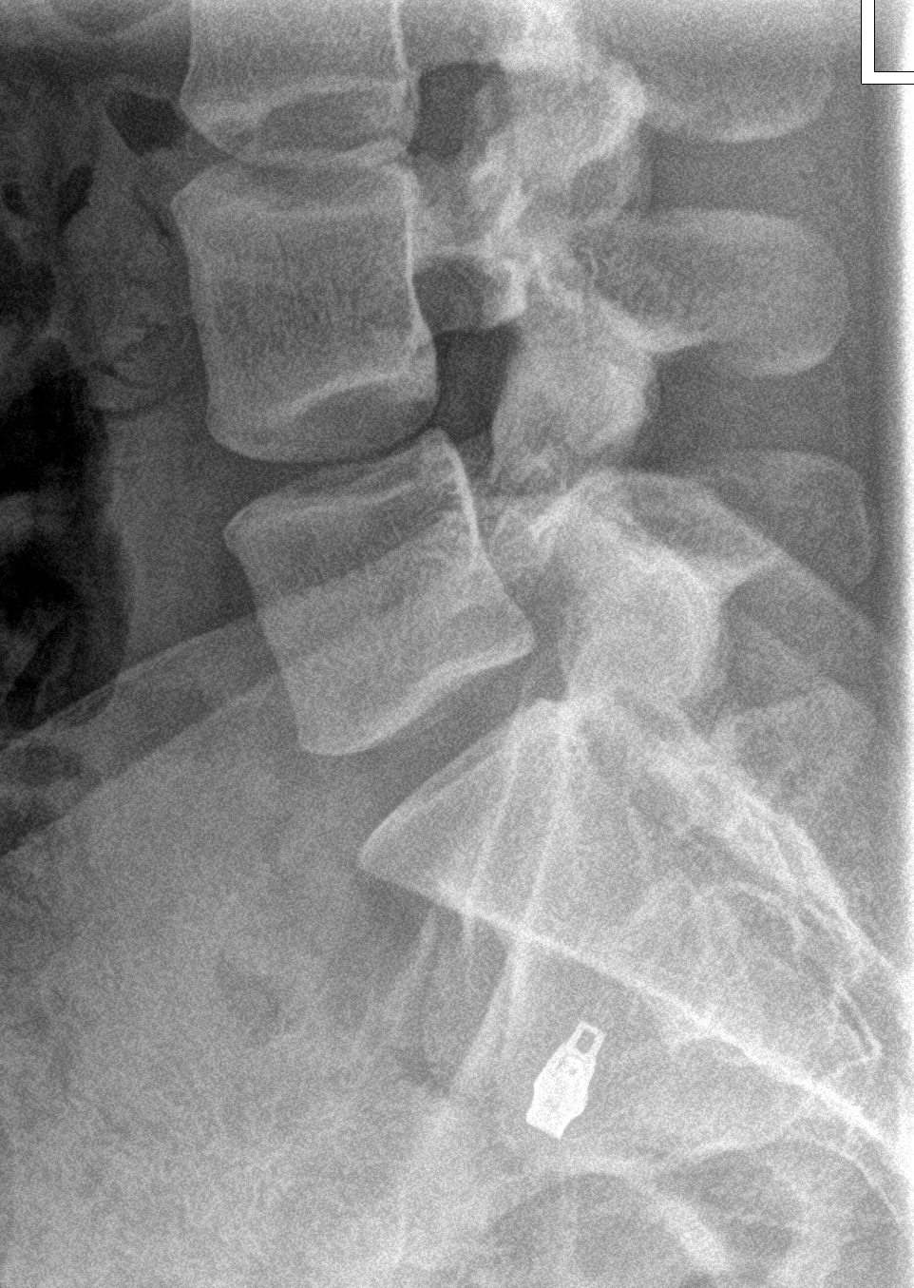

[l-spine lat]
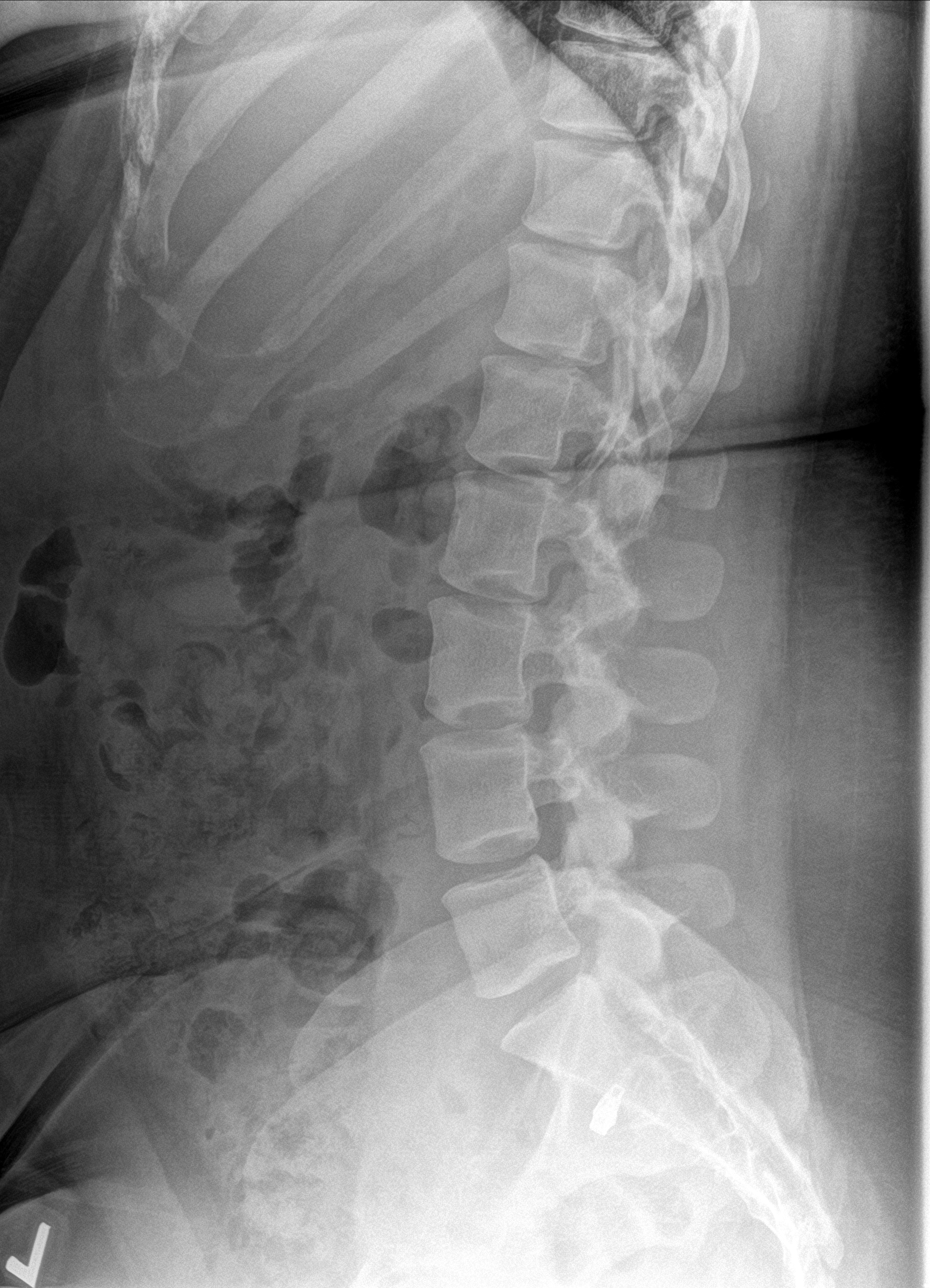

[5 of 5 positions shown; findings below may reference images not displayed]

FINDINGS: Five lumbar type vertebral bodies are well visualized. Vertebral
body height is well maintained. No pars defects are noted. No
anterolisthesis is seen. No soft tissue abnormality is noted.
IMPRESSION: No acute abnormality noted.

## 2023-09-03 MED ORDER — LIDOCAINE VISCOUS HCL 2 % MT SOLN
15.0000 mL | Freq: Once | OROMUCOSAL | Status: AC
  Administered 2023-09-03: 15 mL via ORAL
  Filled 2023-09-03: qty 15

## 2023-09-03 MED ORDER — ONDANSETRON 4 MG PO TBDP
8.0000 mg | ORAL_TABLET | Freq: Once | ORAL | Status: AC
  Administered 2023-09-03: 8 mg via ORAL
  Filled 2023-09-03: qty 2

## 2023-09-03 MED ORDER — ALUM & MAG HYDROXIDE-SIMETH 200-200-20 MG/5ML PO SUSP
30.0000 mL | Freq: Once | ORAL | Status: AC
  Administered 2023-09-03: 30 mL via ORAL
  Filled 2023-09-03: qty 30

## 2023-09-03 MED ORDER — PANTOPRAZOLE SODIUM 40 MG PO TBEC
40.0000 mg | DELAYED_RELEASE_TABLET | Freq: Once | ORAL | Status: AC
  Administered 2023-09-03: 40 mg via ORAL
  Filled 2023-09-03: qty 1

## 2023-09-03 NOTE — ED Notes (Signed)
Patient to xray via w/c.

## 2023-09-03 NOTE — ED Triage Notes (Signed)
Pt presents from home c/o being kicked in lower abd x2 days ago by dog (pitbull). C/o severe lower abd pain, +N+V+D with 'blood in vomit and stool' yesterday. Reports going to [facility] but left before being seen d/t wait time. Pt has since developed central chest pressure, SHOB, and headache.

## 2023-09-04 MED ORDER — IPRATROPIUM-ALBUTEROL 0.5-2.5 (3) MG/3ML IN SOLN
3.0000 mL | Freq: Once | RESPIRATORY_TRACT | Status: AC
  Administered 2023-09-04: 3 mL via RESPIRATORY_TRACT
  Filled 2023-09-04: qty 3

## 2023-09-04 MED ORDER — ACETAMINOPHEN 500 MG PO TABS
1000.0000 mg | ORAL_TABLET | Freq: Once | ORAL | Status: AC
  Administered 2023-09-04: 1000 mg via ORAL
  Filled 2023-09-04: qty 2

## 2023-09-05 NOTE — ED Provider Notes (Signed)
[place] EMERGENCY DEPARTMENT AT [facility] Provider Note   CSN: [id number] Arrival date & time: 09/03/23  2242     History  Chief Complaint  Patient presents with   Abdominal Pain   Chest Pain    Jacqueline Gonzalez is a 23 y.o. female.  Inconsistent history. Patient states a few days ago her baby pitbull jumped up on her with her paws on lower abdomen and she had severe abdominal pain at that time. She states it has not improved at all. She states she sent to [facility] but didn't want to wait that long so went home. Then says it might have improved. No fever. No n/v/d. After discussing her benign exam and normal labs she states that she did actually throw up and one of the times it was blood streaked. She states she also had diarrhea and constipation. Had some blood in her diarrhea as well but that's not new and that was a couple days ago. No urinary symptoms.    Abdominal Pain Associated symptoms: chest pain   Chest Pain Associated symptoms: abdominal pain        Home Medications Prior to Admission medications   Medication Sig Start Date End Date Taking? Authorizing Provider  ADVAIR DISKUS 100-50 MCG/ACT AEPB Inhale 1 puff into the lungs 2 (two) times daily. Patient not taking: Reported on 07/09/2023 11/21/22   [provider]  albuterol (VENTOLIN HFA) 108 (90 Base) MCG/ACT inhaler Inhale into the lungs every 6 (six) hours as needed for wheezing or shortness of breath.    [provider]  buPROPion (WELLBUTRIN XL) 150 MG 24 hr tablet Take by mouth. 03/12/23   [provider]  cetirizine (ZYRTEC) 10 MG tablet Take 1 tablet (10 mg total) by mouth daily. 10/18/22   Redwine, Madison A, PA-C  clindamycin (CLEOCIN) 300 MG capsule Take 1 capsule (300 mg total) by mouth 3 (three) times daily. Patient not taking: Reported on 08/25/2023 06/28/23   Roxy Horseman, PA-C  Drospirenone (SLYND) 4 MG TABS Take 1 tablet (4 mg total) by mouth daily. 08/24/23   Lennart Pall, MD  DTx App - Gastro[facility] IBS) MISC Use as directed Patient not taking: Reported on 04/12/2023 11/25/22   Jenel Lucks, MD  [facility] 120 MG/ML SOAJ Inject into the skin. Patient not taking: Reported on 04/12/2023 01/02/23   [provider]  famotidine (PEPCID) 40 MG tablet Take 1 tablet (40 mg total) by mouth at bedtime. 08/25/23   McMichael, Saddie Benders, PA-C  fluconazole (DIFLUCAN) 150 MG tablet Take 1 tablet (150 mg total) by mouth daily. Patient not taking: Reported on 08/25/2023 06/28/23   Roxy Horseman, PA-C  FLUoxetine (PROZAC) 20 MG capsule Take 60 mg by mouth every morning. 04/10/23   [provider]  FLUoxetine (PROZAC) 40 MG capsule Take 1 capsule (40 mg total) by mouth daily. Patient not taking: Reported on 08/25/2023 12/31/21   Cora Collum, DO  fluticasone [facility]) 50 MCG/ACT nasal spray Place 2 sprays into both nostrils daily. 06/27/22   Claiborne Rigg, NP  gabapentin (NEURONTIN) 100 MG capsule Take 1 capsule (100 mg total) by mouth 3 (three) times daily. Patient not taking: Reported on 07/09/2023 02/17/23   Reva Bores, MD  gabapentin (NEURONTIN) 300 MG capsule Take 300 mg by mouth 3 (three) times daily. 06/23/23   [provider]  Galcanezumab-gnlm (EMGALITY ) Inject 1 Dose into the skin every 30 (thirty) days. Patient not taking: Reported on 08/25/2023  [provider]  hydroxychloroquine (PLAQUENIL) 200 MG tablet Take 200 mg by mouth daily. 08/08/23   [provider]  hydrOXYzine (VISTARIL) 25 MG capsule Take 25 mg by mouth 2 (two) times daily as needed. 08/16/23   [provider]  ibuprofen (ADVIL) 600 MG tablet Take 1 tablet (600 mg total) by mouth every 6 (six) hours as needed. 02/10/23   [place] Bing, MD  lidocaine (XYLOCAINE) 5 % ointment Apply 1 Application topically as needed. 08/24/23   Lennart Pall, MD  medroxyPROGESTERone (PROVERA) 10 MG tablet Take 1 tablet (10 mg total) by mouth  daily. Use for ten days 08/24/23   Lennart Pall, MD  ondansetron (ZOFRAN) 4 MG tablet Take 4 mg by mouth 2 (two) times daily as needed. 05/27/23   [provider]  oxyCODONE-acetaminophen (PERCOCET/ROXICET) 5-325 MG tablet Take 1 tablet by mouth every 6 (six) hours as needed. Patient not taking: Reported on 08/25/2023 02/17/23   Reva Bores, MD  pantoprazole (PROTONIX) 40 MG tablet Take 1 tablet (40 mg total) by mouth 2 (two) times daily before a meal. 09/24/22   Lemmon, Violet Baldy, PA  pregabalin (LYRICA) 50 MG capsule Take 50 mg by mouth 2 (two) times daily. Patient not taking: Reported on 07/09/2023 03/24/23   [provider]  propranolol (INDERAL) 60 MG tablet Take 60 mg by mouth 2 (two) times daily. 03/30/23   [provider]  Psyllium (METAMUCIL) 48.57 % POWD 1 spoonful daily for the next 14 days Patient not taking: Reported on 08/25/2023 02/10/23   Clarita Bing, MD  tiZANidine (ZANAFLEX) 4 MG tablet Take 4 mg by mouth 2 (two) times daily as needed. 03/30/23   [provider]  Vitamin D, Ergocalciferol, (DRISDOL) 1.25 MG (50000 UNIT) CAPS capsule Take 50,000 Units by mouth every 7 (seven) days.    [provider]  VRAYLAR 1.5 MG capsule Take 1.5 mg by mouth daily. 08/17/23   [provider]      Allergies    Blue dyes (parenteral), Blueberry [vaccinium angustifolium], Raspberry, Shellfish allergy, Amitriptyline, and Sumatriptan    Review of Systems   Review of Systems  Cardiovascular:  Positive for chest pain.  Gastrointestinal:  Positive for abdominal pain.    Physical Exam Updated Vital Signs BP 116/71   Pulse 83   Temp 97.9 F (36.6 C)   Resp 19   Ht 5\' 8"  (1.727 m)   Wt 115.7 kg   LMP 09/18/2022 (Approximate)   SpO2 100%   BMI 38.77 kg/m  Physical Exam Vitals and nursing note reviewed.  Constitutional:      Appearance: She is well-developed.  HENT:     Head: Normocephalic and atraumatic.  Cardiovascular:      Rate and Rhythm: Normal rate and regular rhythm.  Pulmonary:     Effort: No respiratory distress.     Breath sounds: No stridor.  Abdominal:     General: There is no distension.  Musculoskeletal:     Cervical back: Normal range of motion.  Neurological:     Mental Status: She is alert.     ED Results / Procedures / Treatments   Labs (all labs ordered are listed, but only abnormal results are displayed) Labs Reviewed  LIPASE, BLOOD - Abnormal; Notable for the following components:      Result Value   Lipase <10 (*)    All other components within normal limits  COMPREHENSIVE METABOLIC PANEL - Abnormal; Notable for the following components:  Glucose, Bld 119 (*)    AST 12 (*)    All other components within normal limits  CBC - Abnormal; Notable for the following components:   Hemoglobin 11.6 (*)    All other components within normal limits  URINALYSIS, ROUTINE W REFLEX MICROSCOPIC - Abnormal; Notable for the following components:   Leukocytes,Ua TRACE (*)    All other components within normal limits  PREGNANCY, URINE  TROPONIN I (HIGH SENSITIVITY)    EKG EKG Interpretation Date/Time:  Friday September 03 2023 22:56:00 EST Ventricular Rate:  77 PR Interval:  116 QRS Duration:  96 QT Interval:  378 QTC Calculation: 427 R Axis:   44  Text Interpretation: Normal sinus rhythm Possible Anterior infarct (cited on or before 21-Jul-2023) Abnormal ECG When compared with ECG of 21-Jul-2023 19:40, No significant change was found q waves unchanged Confirmed by Marily Memos [phone]) on 09/04/2023 12:06:25 AM  Radiology DG Chest 2 View Result Date: 09/04/2023 CLINICAL DATA:  Chest pressure and shortness of breath following being kicked by a dog, initial encounter EXAM: CHEST - 2 VIEW COMPARISON:  07/21/2023 FINDINGS: The heart size and mediastinal contours are within normal limits. Both lungs are clear. The visualized skeletal structures are unremarkable. IMPRESSION: No active  cardiopulmonary disease. Electronically Signed   By: Alcide Clever M.D.   On: 09/04/2023 00:06    Procedures Procedures    Medications Ordered in ED Medications  alum & mag hydroxide-simeth (MAALOX/MYLANTA) 200-200-20 MG/5ML suspension 30 mL (30 mLs Oral Given 09/03/23 2350)    And  lidocaine (XYLOCAINE) 2 % viscous mouth solution 15 mL (15 mLs Oral Given 09/03/23 2350)  pantoprazole (PROTONIX) EC tablet 40 mg (40 mg Oral Given 09/03/23 2350)  ondansetron (ZOFRAN-ODT) disintegrating tablet 8 mg (8 mg Oral Given 09/03/23 2349)  ipratropium-albuterol (DUONEB) 0.5-2.5 (3) MG/3ML nebulizer solution 3 mL (3 mLs Nebulization Given 09/04/23 0016)  acetaminophen (TYLENOL) tablet 1,000 mg (1,000 mg Oral Given 09/04/23 0129)    ED Course/ Medical Decision Making/ A&P                                 Medical Decision Making Amount and/or Complexity of Data Reviewed Labs: ordered. Radiology: ordered.  Risk OTC drugs. Prescription drug management.   Normal labs. Normal exam. Inconsistent story. Normal labs. Low suspicion for intraabdominal injury from low mechanism injury with normal labs and thus no indication for ct scan. Explained rationale to both mother and patient.    Final Clinical Impression(s) / ED Diagnoses Final diagnoses:  Generalized abdominal pain  Nonspecific chest pain    Rx / DC Orders ED Discharge Orders     None         Amberrose Friebel, Barbara Cower, MD 09/05/23 0401

## 2023-09-08 ENCOUNTER — Emergency Department (HOSPITAL_BASED_OUTPATIENT_CLINIC_OR_DEPARTMENT_OTHER)
Admission: EM | Admit: 2023-09-08 | Discharge: 2023-09-08 | Disposition: A | Discharge status: Home / Self Care | Payer: Medicaid Other | Attending: Emergency Medicine | Admitting: Emergency Medicine

## 2023-09-08 ENCOUNTER — Other Ambulatory Visit: Payer: Self-pay

## 2023-09-08 ENCOUNTER — Encounter (HOSPITAL_BASED_OUTPATIENT_CLINIC_OR_DEPARTMENT_OTHER): Payer: Self-pay

## 2023-09-08 ENCOUNTER — Emergency Department (HOSPITAL_BASED_OUTPATIENT_CLINIC_OR_DEPARTMENT_OTHER): Payer: Medicaid Other

## 2023-09-08 DIAGNOSIS — G8929 Other chronic pain: Secondary | ICD-10-CM | POA: Insufficient documentation

## 2023-09-08 DIAGNOSIS — R109 Unspecified abdominal pain: Secondary | ICD-10-CM | POA: Diagnosis present

## 2023-09-08 DIAGNOSIS — I10 Essential (primary) hypertension: Secondary | ICD-10-CM | POA: Insufficient documentation

## 2023-09-08 DIAGNOSIS — Z79899 Other long term (current) drug therapy: Secondary | ICD-10-CM | POA: Insufficient documentation

## 2023-09-08 LAB — COMPREHENSIVE METABOLIC PANEL
ALT: 11 U/L (ref 0–44)
AST: 10 U/L — ABNORMAL LOW (ref 15–41)
Albumin: 3.8 g/dL (ref 3.5–5.0)
Alkaline Phosphatase: 70 U/L (ref 38–126)
Anion gap: 7 (ref 5–15)
BUN: 7 mg/dL (ref 6–20)
CO2: 26 mmol/L (ref 22–32)
Calcium: 9 mg/dL (ref 8.9–10.3)
Chloride: 104 mmol/L (ref 98–111)
Creatinine, Ser: 0.78 mg/dL (ref 0.44–1.00)
GFR, Estimated: >60 mL/min (ref >60)
Glucose, Bld: 101 mg/dL — ABNORMAL HIGH (ref 70–99)
Potassium: 3.6 mmol/L (ref 3.5–5.1)
Sodium: 137 mmol/L (ref 135–145)
Total Bilirubin: 0.2 mg/dL (ref 0.0–1.2)
Total Protein: 7.2 g/dL (ref 6.5–8.1)

## 2023-09-08 LAB — URINALYSIS, ROUTINE W REFLEX MICROSCOPIC
Bilirubin Urine: NEGATIVE
Glucose, UA: NEGATIVE mg/dL
Hgb urine dipstick: NEGATIVE
Ketones, ur: NEGATIVE mg/dL
Leukocytes,Ua: NEGATIVE
Nitrite: NEGATIVE
Protein, ur: NEGATIVE mg/dL
Specific Gravity, Urine: 1.014 (ref 1.005–1.030)
pH: 5.5 (ref 5.0–8.0)

## 2023-09-08 LAB — CBC WITH DIFFERENTIAL/PLATELET
Abs Immature Granulocytes: 0.01 10*3/uL (ref 0.00–0.07)
Basophils Absolute: 0 10*3/uL (ref 0.0–0.1)
Basophils Relative: 0 %
Eosinophils Absolute: 0.1 10*3/uL (ref 0.0–0.5)
Eosinophils Relative: 1 %
HCT: 35.6 % — ABNORMAL LOW (ref 36.0–46.0)
Hemoglobin: 11.2 g/dL — ABNORMAL LOW (ref 12.0–15.0)
Immature Granulocytes: 0 %
Lymphocytes Relative: 34 %
Lymphs Abs: 2.8 10*3/uL (ref 0.7–4.0)
MCH: 26.8 pg (ref 26.0–34.0)
MCHC: 31.5 g/dL (ref 30.0–36.0)
MCV: 85.2 fL (ref 80.0–100.0)
Monocytes Absolute: 0.6 10*3/uL (ref 0.1–1.0)
Monocytes Relative: 8 %
Neutro Abs: 4.8 10*3/uL (ref 1.7–7.7)
Neutrophils Relative %: 57 %
Platelets: 224 10*3/uL (ref 150–400)
RBC: 4.18 MIL/uL (ref 3.87–5.11)
RDW: 13.2 % (ref 11.5–15.5)
WBC: 8.3 10*3/uL (ref 4.0–10.5)
nRBC: 0 % (ref 0.0–0.2)

## 2023-09-08 LAB — PREGNANCY, URINE: Preg Test, Ur: NEGATIVE

## 2023-09-08 LAB — LIPASE, BLOOD: Lipase: 10 U/L — ABNORMAL LOW (ref 11–51)

## 2023-09-08 MED ORDER — IOHEXOL 300 MG/ML  SOLN
100.0000 mL | Freq: Once | INTRAMUSCULAR | Status: AC | PRN
  Administered 2023-09-08: 80 mL via INTRAVENOUS

## 2023-09-08 MED ORDER — DROPERIDOL 2.5 MG/ML IJ SOLN
2.5000 mg | Freq: Once | INTRAMUSCULAR | Status: AC
  Administered 2023-09-08: 2.5 mg via INTRAVENOUS
  Filled 2023-09-08: qty 2

## 2023-09-08 MED ORDER — OMEPRAZOLE 20 MG PO CPDR
20.0000 mg | DELAYED_RELEASE_CAPSULE | Freq: Every day | ORAL | 0 refills | Status: DC
Start: 2023-09-08 — End: 2024-02-01

## 2023-09-08 NOTE — ED Notes (Signed)
Patient asking if she can be discharged now, she "needs to be somewhere"

## 2023-09-08 NOTE — ED Triage Notes (Signed)
She c/o on-going, generalized abd. Discomfort, plus occasional n/v/d. She is in no distress.

## 2023-09-08 NOTE — ED Notes (Signed)
She has repeatedly told us "I have to leave now". Dr. Andria Meuse is aware and tells [place] pt. Is free to elope if desired.

## 2023-09-08 NOTE — Discharge Instructions (Addendum)
While you are in the emergency room you had blood work and a CT scan done.  These test were normal.  Like we discussed, I think that the symptoms will likely be better treated by your primary care doctor or a GI doctor.  Please call the GI doctor that you have seen previously for an appointment.  In the meantime, you can begin taking omeprazole, which is a medication that can help the stomach.  Return to the emergency room for any new or worsening abdominal pain.

## 2023-09-08 NOTE — ED Provider Notes (Signed)
[place] EMERGENCY DEPARTMENT AT [facility] Provider Note  CSN: [id number] Arrival date & time: 09/08/23 4166  Chief Complaint(s) Abdominal Pain  HPI Jacqueline Gonzalez is a 23 y.o. female who is here today for chronic abdominal pain.  Patient reports that these episodes of been ongoing for years, she has followed with multiple specialists without resolution.  She says that she was seen here Valentine's Day, and since then has continued to have pain, has not had an appetite.  Denies any EtOH, marijuana use.   Past Medical History Past Medical History:  Diagnosis Date   Anemia    Anxiety    Arthritis    knees   Chlamydia 11/13/2021   Constipation, chronic    Depression    Dyspnea    ON OCC   Environmental allergies    Ganglion cyst of dorsum of left wrist    RECURRENT   GERD (gastroesophageal reflux disease)    watches diet   Headache MIGRAINE    History of hypertension    IN PAST, NO RECENT HTN PER PT 01-19-2023   IBS (irritable bowel syndrome)    Lactose intolerance    PCOS (polycystic ovarian syndrome)    Pre-diabetes    Patient Active Problem List   Diagnosis Date Noted   Intertrigo 04/12/2023   Seronegative inflammatory arthritis 02/09/2023   Precordial pain 01/28/2023   Positive ANA (antinuclear antibody) 01/08/2023   IBS (irritable bowel syndrome) 01/08/2023   Sedimentation rate elevation 01/08/2023   Polyarthralgia 01/08/2023   Vitamin D deficiency 01/08/2023   PCOS (polycystic ovarian syndrome) 08/26/2022   LGSIL of cervix of undetermined significance 08/26/2022   History of PID 08/18/2022   Rectal abscess supralevator s/p I&D 03/14/2022 03/14/2022   Rectal pain    UTI (urinary tract infection) 03/10/2022   Rectal bleeding    Anal fissure    Abnormal CT scan, colon    Proctocolitis 03/09/2022   Menorrhagia with irregular cycle 11/18/2021   Dysmenorrhea 11/18/2021   Chronic anemia 11/17/2021   Lower abdominal pain 11/07/2021   Moderate  episode of recurrent major depressive disorder (HCC) 06/23/2021   Low back pain 05/02/2021   Headache 09/03/2020   Nonspecific abdominal pain 08/30/2020   Ganglion cyst of dorsum of left wrist 11/03/2019   Constipation, chronic 01/21/2018   Irregular periods 01/21/2018   Generalized anxiety disorder 01/21/2018   Home Medication(s) Prior to Admission medications   Medication Sig Start Date End Date Taking? Authorizing Provider  omeprazole (PRILOSEC) 20 MG capsule Take 1 capsule (20 mg total) by mouth daily. 09/08/23 10/08/23 Yes Anders Simmonds T, DO  ADVAIR DISKUS 100-50 MCG/ACT AEPB Inhale 1 puff into the lungs 2 (two) times daily. Patient not taking: Reported on 07/09/2023 11/21/22   [provider]  albuterol (VENTOLIN HFA) 108 (90 Base) MCG/ACT inhaler Inhale into the lungs every 6 (six) hours as needed for wheezing or shortness of breath.    [provider]  buPROPion (WELLBUTRIN XL) 150 MG 24 hr tablet Take by mouth. 03/12/23   [provider]  cetirizine (ZYRTEC) 10 MG tablet Take 1 tablet (10 mg total) by mouth daily. 10/18/22   Redwine, Madison A, PA-C  clindamycin (CLEOCIN) 300 MG capsule Take 1 capsule (300 mg total) by mouth 3 (three) times daily. Patient not taking: Reported on 08/25/2023 06/28/23   Roxy Horseman, PA-C  Drospirenone (SLYND) 4 MG TABS Take 1 tablet (4 mg total) by mouth daily. 08/24/23   Lennart Pall, MD  DTx App -  Gastrointestinal [facility] IBS) MISC Use as directed Patient not taking: Reported on 04/12/2023 11/25/22   Jenel Lucks, MD  EMGALITY 120 MG/ML SOAJ Inject into the skin. Patient not taking: Reported on 04/12/2023 01/02/23   [provider]  famotidine (PEPCID) 40 MG tablet Take 1 tablet (40 mg total) by mouth at bedtime. 08/25/23   McMichael, Saddie Benders, PA-C  fluconazole (DIFLUCAN) 150 MG tablet Take 1 tablet (150 mg total) by mouth daily. Patient not taking: Reported on 08/25/2023 06/28/23   Roxy Horseman, PA-C   FLUoxetine (PROZAC) 20 MG capsule Take 60 mg by mouth every morning. 04/10/23   [provider]  FLUoxetine (PROZAC) 40 MG capsule Take 1 capsule (40 mg total) by mouth daily. Patient not taking: Reported on 08/25/2023 12/31/21   Cora Collum, DO  fluticasone [facility]) 50 MCG/ACT nasal spray Place 2 sprays into both nostrils daily. 06/27/22   Claiborne Rigg, NP  gabapentin (NEURONTIN) 100 MG capsule Take 1 capsule (100 mg total) by mouth 3 (three) times daily. Patient not taking: Reported on 07/09/2023 02/17/23   Reva Bores, MD  gabapentin (NEURONTIN) 300 MG capsule Take 300 mg by mouth 3 (three) times daily. 06/23/23   [provider]  Galcanezumab-gnlm (EMGALITY [place]) Inject 1 Dose into the skin every 30 (thirty) days. Patient not taking: Reported on 08/25/2023    [provider]  hydroxychloroquine (PLAQUENIL) 200 MG tablet Take 200 mg by mouth daily. 08/08/23   [provider]  hydrOXYzine (VISTARIL) 25 MG capsule Take 25 mg by mouth 2 (two) times daily as needed. 08/16/23   [provider]  ibuprofen (ADVIL) 600 MG tablet Take 1 tablet (600 mg total) by mouth every 6 (six) hours as needed. 02/10/23   [place] Bing, MD  lidocaine (XYLOCAINE) 5 % ointment Apply 1 Application topically as needed. 08/24/23   Lennart Pall, MD  medroxyPROGESTERone (PROVERA) 10 MG tablet Take 1 tablet (10 mg total) by mouth daily. Use for ten days 08/24/23   Lennart Pall, MD  ondansetron (ZOFRAN) 4 MG tablet Take 4 mg by mouth 2 (two) times daily as needed. 05/27/23   [provider]  oxyCODONE-acetaminophen (PERCOCET/ROXICET) 5-325 MG tablet Take 1 tablet by mouth every 6 (six) hours as needed. Patient not taking: Reported on 08/25/2023 02/17/23   Reva Bores, MD  pantoprazole (PROTONIX) 40 MG tablet Take 1 tablet (40 mg total) by mouth 2 (two) times daily before a meal. 09/24/22   Lemmon, Violet Baldy, PA  pregabalin (LYRICA) 50 MG capsule Take 50 mg  by mouth 2 (two) times daily. Patient not taking: Reported on 07/09/2023 03/24/23   [provider]  propranolol (INDERAL) 60 MG tablet Take 60 mg by mouth 2 (two) times daily. 03/30/23   [provider]  Psyllium (METAMUCIL) 48.57 % POWD 1 spoonful daily for the next 14 days Patient not taking: Reported on 08/25/2023 02/10/23   Anthony Bing, MD  tiZANidine (ZANAFLEX) 4 MG tablet Take 4 mg by mouth 2 (two) times daily as needed. 03/30/23   [provider]  Vitamin D, Ergocalciferol, (DRISDOL) 1.25 MG (50000 UNIT) CAPS capsule Take 50,000 Units by mouth every 7 (seven) days.    [provider]  VRAYLAR 1.5 MG capsule Take 1.5 mg by mouth daily. 08/17/23   [provider]  Past Surgical History Past Surgical History:  Procedure Laterality Date   GANGLION CYST EXCISION Left 04/24/2021   Procedure: left recurrent dorsal carpal ganglion excision;  Surgeon: Gomez Cleverly, MD;  Location: [facility];  Service: Orthopedics;  Laterality: Left;   INCISION AND DRAINAGE PERIRECTAL ABSCESS N/A 03/14/2022   Procedure: IRRIGATION AND DEBRIDEMENT PERIRECTAL ABSCESS;  Surgeon: Karie Soda, MD;  Location: [facility];  Service: General;  Laterality: N/A;   LAPAROSCOPY N/A 02/10/2023   Procedure: LAPAROSCOPY DIAGNOSTIC;  Surgeon: [place] Bing, MD;  Location: [facility] ;  Service: Gynecology;  Laterality: N/A;   TONSILLECTOMY AND ADENOIDECTOMY     AGE 35   WISDOM TOOTH EXTRACTION  03/18/2021   WRIST GANGLION EXCISION Left 02/2020   Family History Family History  Problem Relation Age of Onset   Heart failure Mother    Migraines Mother    Irritable bowel syndrome Mother    Other Mother 64       Tricuspid valve regurgitation   Asthma Father    Hypertension Maternal Grandmother    Diabetes Maternal Grandmother     Hypertension Maternal Grandfather    Diabetes Maternal Grandfather    Hypertension Paternal Grandmother    Stroke Paternal Grandmother    Diabetes Paternal Grandmother    Hypertension Paternal Grandfather    Diabetes Paternal Grandfather    Kidney disease Paternal Grandfather    Ovarian cancer Maternal Aunt    Graves' disease Paternal Aunt    Stomach cancer Neg Hx    Esophageal cancer Neg Hx    Colon cancer Neg Hx     Social History Social History   Tobacco Use   Smoking status: Never    Passive exposure: Past   Smokeless tobacco: Never  Vaping Use   Vaping status: Never Used  Substance Use Topics   Alcohol use: Yes    Comment: occ   Drug use: Never    Comment: Smokes CBD   Allergies Blue dyes (parenteral), Blueberry [vaccinium angustifolium], Raspberry, Shellfish allergy, Amitriptyline, and Sumatriptan  Review of Systems Review of Systems  Physical Exam Vital Signs  I have reviewed the triage vital signs BP 114/71   Pulse 72   LMP  (LMP Unknown)   SpO2 99%   Physical Exam Vitals reviewed.  HENT:     Head: Normocephalic.  Abdominal:     Palpations: Abdomen is soft.     Comments: Patient recoils with superficial palpation of the abdomen.  Abdomen soft, no distention, no masses.  No rashes.  Neurological:     Mental Status: She is alert.     ED Results and Treatments Labs (all labs ordered are listed, but only abnormal results are displayed) Labs Reviewed  CBC WITH DIFFERENTIAL/PLATELET - Abnormal; Notable for the following components:      Result Value   Hemoglobin 11.2 (*)    HCT 35.6 (*)    All other components within normal limits  COMPREHENSIVE METABOLIC PANEL - Abnormal; Notable for the following components:   Glucose, Bld 101 (*)    AST 10 (*)    All other components within normal limits  LIPASE, BLOOD - Abnormal; Notable for the following components:   Lipase 10 (*)    All other components within normal limits  PREGNANCY, URINE   URINALYSIS, ROUTINE W REFLEX MICROSCOPIC  Radiology CT ABDOMEN PELVIS W CONTRAST Result Date: 09/08/2023 CLINICAL DATA:  Acute generalized abdominal pain. EXAM: CT ABDOMEN AND PELVIS WITH CONTRAST TECHNIQUE: Multidetector CT imaging of the abdomen and pelvis was performed using the standard protocol following bolus administration of intravenous contrast. RADIATION DOSE REDUCTION: This exam was performed according to the departmental dose-optimization program which includes automated exposure control, adjustment of the mA and/or kV according to patient size and/or use of iterative reconstruction technique. CONTRAST:  80mL OMNIPAQUE IOHEXOL 300 MG/ML  SOLN COMPARISON:  March 30, 2023. FINDINGS: Lower chest: No acute abnormality. Hepatobiliary: No focal liver abnormality is seen. No gallstones, gallbladder wall thickening, or biliary dilatation. Pancreas: Unremarkable. No pancreatic ductal dilatation or surrounding inflammatory changes. Spleen: Normal in size without focal abnormality. Adrenals/Urinary Tract: Adrenal glands are unremarkable. Kidneys are normal, without renal calculi, focal lesion, or hydronephrosis. Bladder is unremarkable. Stomach/Bowel: Stomach is within normal limits. Appendix appears normal. No evidence of bowel wall thickening, distention, or inflammatory changes. Vascular/Lymphatic: No significant vascular findings are present. No enlarged abdominal or pelvic lymph nodes. Reproductive: Uterus and bilateral adnexa are unremarkable. Other: No abdominal wall hernia or abnormality. No abdominopelvic ascites. Musculoskeletal: No acute or significant osseous findings. IMPRESSION: No abnormality seen in the abdomen or pelvis. Electronically Signed   By: Lupita Raider M.D.   On: 09/08/2023 10:13    Pertinent labs & imaging results that were available during my care of  the patient were reviewed by me and considered in my medical decision making (see MDM for details).  Medications Ordered in ED Medications  droperidol (INAPSINE) 2.5 MG/ML injection 2.5 mg (2.5 mg Intravenous Given 09/08/23 0850)  iohexol (OMNIPAQUE) 300 MG/ML solution 100 mL (80 mLs Intravenous Contrast Given 09/08/23 0920)                                                                                                                                     Procedures Procedures  (including critical care time)  Medical Decision Making / ED Course   This patient presents to the ED for concern of abdominal pain, this involves an extensive number of treatment options, and is a complaint that carries with it a high risk of complications and morbidity.  The differential diagnosis includes chronic abdominal pain, biliary colic, less likely cholecystitis, less likely pancreatitis, less likely obstruction, less likely mesenteric ischemia.  MDM: Patient with significant abdominal pain with even light palpation.  Overall looks comfortable, abdomen was soft.  With the duration of the patient's symptoms I have lower suspicion for an acute abdominal process such as the above listed differentials.  With the patient's repeat visit to the emergency room, will proceed with an abundance of caution including labs and imaging.  I am treating the patient with droperidol.  Ultimately, believe this patient has functional abdominal pain.  She will need to follow-up with her GI doctor.  Reassessment 10:20 AM-patient's blood work reviewed, no  anemia, no leukocytosis, normal renal function, normal LFTs.  CT imaging negative.  Patient states that she has somewhere to go and would like to be discharged.  Given her symptoms are improved I believe this is appropriate.  Friend is driving.  Will discharge.   Additional history obtained: -Additional history obtained from  -External records from outside source obtained and  reviewed including: Chart review including previous notes, labs, imaging, consultation notes   Lab Tests: -I ordered, reviewed, and interpreted labs.   The pertinent results include:   Labs Reviewed  CBC WITH DIFFERENTIAL/PLATELET - Abnormal; Notable for the following components:      Result Value   Hemoglobin 11.2 (*)    HCT 35.6 (*)    All other components within normal limits  COMPREHENSIVE METABOLIC PANEL - Abnormal; Notable for the following components:   Glucose, Bld 101 (*)    AST 10 (*)    All other components within normal limits  LIPASE, BLOOD - Abnormal; Notable for the following components:   Lipase 10 (*)    All other components within normal limits  PREGNANCY, URINE  URINALYSIS, ROUTINE W REFLEX MICROSCOPIC      EKG   EKG Interpretation Date/Time:    Ventricular Rate:    PR Interval:    QRS Duration:    QT Interval:    QTC Calculation:   R Axis:      Text Interpretation:           Imaging Studies ordered: I ordered imaging studies including CT imaging the abdomen pelvis I independently visualized and interpreted imaging. I agree with the radiologist interpretation   Medicines ordered and prescription drug management: Meds ordered this encounter  Medications   droperidol (INAPSINE) 2.5 MG/ML injection 2.5 mg   iohexol (OMNIPAQUE) 300 MG/ML solution 100 mL   omeprazole (PRILOSEC) 20 MG capsule    Sig: Take 1 capsule (20 mg total) by mouth daily.    Dispense:  30 capsule    Refill:  0    -I have reviewed the patients home medicines and have made adjustments as needed    Cardiac Monitoring: The patient was maintained on a cardiac monitor.  I personally viewed and interpreted the cardiac monitored which showed an underlying rhythm of: Sinus rhythm  Social Determinants of Health:  Factors impacting patients care include: Poor medical compliance   Reevaluation: After the interventions noted above, I reevaluated the patient and found that  they have :improved  Co morbidities that complicate the patient evaluation  Past Medical History:  Diagnosis Date   Anemia    Anxiety    Arthritis    knees   Chlamydia 11/13/2021   Constipation, chronic    Depression    Dyspnea    ON OCC   Environmental allergies    Ganglion cyst of dorsum of left wrist    RECURRENT   GERD (gastroesophageal reflux disease)    watches diet   Headache MIGRAINE    History of hypertension    IN PAST, NO RECENT HTN PER PT 01-19-2023   IBS (irritable bowel syndrome)    Lactose intolerance    PCOS (polycystic ovarian syndrome)    Pre-diabetes       Dispostion: I considered admission for this patient, however patient is appropriate for discharge.     Final Clinical Impression(s) / ED Diagnoses Final diagnoses:  Chronic abdominal pain     @PCDICTATION @    Anders Simmonds T, DO 09/08/23 1022

## 2023-09-08 NOTE — ED Notes (Signed)
 Patient transported to CT

## 2023-09-23 NOTE — Progress Notes (Signed)
 Office Visit Note  Patient: Jacqueline Gonzalez             Date of Birth: 2000-09-04           MRN: [id number]             PCP: Levonne Lapping, NP Referring: Levonne Lapping, NP Visit Date: 10/07/2023   Subjective:   Discussed the use of AI scribe software for clinical note transcription with the patient, who gave verbal consent to proceed.  History of Present Illness   Jacqueline Gonzalez is a 23 y.o. female here for follow up for continued joint pain at multiple areas on HCQ 400 mg daily.  She experiences persistent abdominal pain throughout the day and night. A CT scan of the abdomen on January 19th did not reveal any abnormalities, and a previous endoscopy showed no ulcers or inflammatory lesions. She was initially recommended pantoprazole for stomach acid suppression due to concerns about gastritis but has since switched to [facility] and omeprazole without significant improvement. She has not had an ultrasound to evaluate the liver or gallbladder.  She experiences nausea and diarrhea, with blood in her stool approximately every two weeks. She describes being usually constipated, with difficulty in bowel movements. She was evaluated by a gastroenterologist less than a month ago, who suggested her symptoms might be related to IBS. She was recently prescribed Cymbalta by her psychiatrist, which she started a couple of weeks ago.  She reports joint pain and stiffness, particularly in the mornings, with numbness in her hands and tingling in her feet. She has been diagnosed with carpal tunnel syndrome in one hand. She experiences significant fatigue and difficulty performing daily activities, such as washing dishes and driving. She reports poor sleep, getting only about four hours per night, and sometimes sleeping excessively during the day. She has not had a sleep study or seen a neurologist for these issues.  She mentions a history of rashes, which have been minor recently. She uses a cream, possibly  triamcinolone, for her skin. She also takes gabapentin but has not used muscle relaxers like Flexeril. She recalls a possible flu-like illness since her last visit but no major infections. She has not had any recent exposure to ticks or been tested for Lyme disease.   Previous HPI 07/09/2023 Jacqueline Gonzalez is a 23 y.o. female here for follow up for continued joint pain at multiple areas currently having trouble especially in her wrist and ankles.  She reports no significant improvement despite the initiation of Plaquenil 200 mg daily. They describe a new onset of skin issues, characterized by abnormal healing of scars and easy bruising, particularly after blood draws. The patient also reports constant pain, which is severe enough to consider daily hospital visits, but denies any specific aggravating factors. Over-the-counter pain medications, such as ibuprofen and Tylenol, are currently being used for pain management.   The patient was recently seen by a gynecologist, who recommended a gastroenterology consult, scheduled for February. They also report a recent bout of bronchitis, which was treated with prednisone and antibiotics. During this time, they also had a tooth infection, which was managed by extraction. The patient did not notice any significant changes while on the short course of steroids.   The patient also reports occasional blood in their stool, which varies from bright red to black. The patient also has a history of eczema, which has been resistant to previous treatments. They deny any circulation problems in the colder weather.  Previous HPI 04/12/2023 Jacqueline Gonzalez is a 23 y.o. female here for follow up for continued joint pain at multiple areas currently having trouble especially in her wrist and ankles.  So far does not feel hydroxychloroquine has changed her symptoms dramatically. Also still has ongoing lower abdominal and pelvic pain chronically. She had laparoscopy for  this with an adhesion found but not thought clinically important. Still also has persistent diarrhea and constipation.  Has been getting some itchy rash and describes redness along the inner sides and underneath her breasts was recommended to use her topical eczema cream but not seeing any benefit so far.   Previous HPI 02/09/2023 Jacqueline Gonzalez is a 23 y.o. female here for follow up for continued joint pain at multiple areas currently having trouble especially in her wrist and ankles.  Workup at our initial visit showed a high sedimentation rate high complement C3 and elevated RNP antibody titer so some concern for underlying inflammatory arthritis process.  She is noticing more symptoms around the ganglion cyst of the left wrist and pain especially with active movement.  Ankle pain and soreness especially with use but not noticing any specific nodules just generalized swelling.   Previous HPI 01/08/23 Jacqueline Gonzalez is a 23 y.o. female here for evaluation of positive ANA associated with joint pains and elevated sedimentation rate.  She reports longstanding history of joint pain in multiple areas going all the way back to adolescence.  However her current problems with persistent and sometimes activity limiting widespread pain has been more prominent in the past 1 year.  This is also associated with worsening chronic fatigue.  She did not recall any preceding serious infection.  She mostly describes this as being all over the but sometimes in particular has had more back and chronic pelvic pain.  Has had low back pain and radicular symptoms extending throughout the left leg up to include part of the perineum.  Also reports previous diagnosis with knee osteoarthritis.  Pain is varying in severity worse on some days than others not associated with much visible swelling or discoloration in affected areas.  Also sensitive to pressure or temperature changes in affected areas.  Previous treatments tried  include gabapentin, oxycodone, and high-dose ibuprofen but did not find any of them highly effective. Lab testing looking into these increase symptoms in February showing positive ANA, sed rate of 40, and deficient vitamin D at 4.6.  She was also started on 50,000 units weekly supplementation for vitamin D but has not felt a noticeable difference. She has history of eczema has been treated with what sounds like topical emollients in dermatology office but does not currently follow-up.  Describes her skin breaking out and irritation if having prolonged sun exposure.  No specific lesions persisting mostly just the irritation.  She reports several episodes with cystic lesions in the skin under her armpits under the breasts and in the groin.  Sometimes with drainage.  No previous diagnosis of hidradenitis suppurativa. She intermittently gets thrush possibly associated with her inhaler treatment but she does not use this consistently.  Does not get frequent oral nasal ulcers.  She denies typical Raynaud's symptoms, lymphadenopathy, or history of blood clots. Is also had more trouble with migraine headaches during this year was started on injection treatments for the past 3 months.  Also has persistent irritable bowel symptoms mixed constipation and diarrhea on Bentyl with some benefit.  She experiences some kind of dizziness or vertiginous symptoms these come and go without a  specific position or provocation.  Says sometimes last up to 1 hour at a time.   Family history includes an aunt with Graves' disease but no first-degree relative known autoimmune disease.   Labs reviewed 11/2022 TSH wnl   08/2022 ANA pos ESR 40 Vit D 4.6 RF neg CCP neg   Review of Systems  Constitutional:  Positive for fatigue.  HENT:  Positive for mouth dryness. Negative for mouth sores.   Eyes:  Positive for photophobia. Negative for dryness.  Respiratory:  Positive for cough, shortness of breath and wheezing.    Cardiovascular:  Positive for chest pain and palpitations.  Gastrointestinal:  Positive for blood in stool, constipation and diarrhea.  Endocrine: Positive for increased urination.  Genitourinary:  Negative for painful urination and involuntary urination.  Musculoskeletal:  Positive for joint pain, gait problem, joint pain, myalgias, muscle weakness, morning stiffness, muscle tenderness and myalgias. Negative for joint swelling.  Skin:  Positive for hair loss and sensitivity to sunlight. Negative for color change and rash.  Allergic/Immunologic: Negative for susceptible to infections.  Neurological:  Positive for dizziness and headaches.  Hematological:  Negative for swollen glands.  Psychiatric/Behavioral:  Positive for depressed mood and sleep disturbance. The patient is nervous/anxious.     PMFS History:  Patient Active Problem List   Diagnosis Date Noted   Fibromyalgia syndrome 10/07/2023   Intertrigo 04/12/2023   Seronegative inflammatory arthritis 02/09/2023   Precordial pain 01/28/2023   Positive ANA (antinuclear antibody) 01/08/2023   IBS (irritable bowel syndrome) 01/08/2023   Sedimentation rate elevation 01/08/2023   Polyarthralgia 01/08/2023   Vitamin D deficiency 01/08/2023   PCOS (polycystic ovarian syndrome) 08/26/2022   LGSIL of cervix of undetermined significance 08/26/2022   History of PID 08/18/2022   Rectal abscess supralevator s/p I&D 03/14/2022 03/14/2022   Rectal pain    UTI (urinary tract infection) 03/10/2022   Rectal bleeding    Anal fissure    Abnormal CT scan, colon    Proctocolitis 03/09/2022   Menorrhagia with irregular cycle 11/18/2021   Dysmenorrhea 11/18/2021   Chronic anemia 11/17/2021   Lower abdominal pain 11/07/2021   Moderate episode of recurrent major depressive disorder (HCC) 06/23/2021   Low back pain 05/02/2021   Headache 09/03/2020   Nonspecific abdominal pain 08/30/2020   Ganglion cyst of dorsum of left wrist 11/03/2019    Constipation, chronic 01/21/2018   Irregular periods 01/21/2018   Generalized anxiety disorder 01/21/2018    Past Medical History:  Diagnosis Date   Anemia    Anxiety    Arthritis    knees   Chlamydia 11/13/2021   Constipation, chronic    Depression    Dyspnea    ON OCC   Environmental allergies    Ganglion cyst of dorsum of left wrist    RECURRENT   GERD (gastroesophageal reflux disease)    watches diet   Headache MIGRAINE    History of hypertension    IN PAST, NO RECENT HTN PER PT 01-19-2023   IBS (irritable bowel syndrome)    Lactose intolerance    PCOS (polycystic ovarian syndrome)    Pre-diabetes     Family History  Problem Relation Age of Onset   Heart failure Mother    Migraines Mother    Irritable bowel syndrome Mother    Other Mother 5       Tricuspid valve regurgitation   Asthma Father    Ovarian cancer Maternal Aunt    Graves' disease Paternal Aunt    Hypertension  Maternal Grandmother    Diabetes Maternal Grandmother    Hypertension Maternal Grandfather    Diabetes Maternal Grandfather    Hypertension Paternal Grandmother    Stroke Paternal Grandmother    Diabetes Paternal Grandmother    Hypertension Paternal Grandfather    Diabetes Paternal Grandfather    Kidney disease Paternal Grandfather    Stomach cancer Neg Hx    Esophageal cancer Neg Hx    Colon cancer Neg Hx    Past Surgical History:  Procedure Laterality Date   GANGLION CYST EXCISION Left 04/24/2021   Procedure: left recurrent dorsal carpal ganglion excision;  Surgeon: Gomez Cleverly, MD;  Location: [facility];  Service: Orthopedics;  Laterality: Left;   INCISION AND DRAINAGE PERIRECTAL ABSCESS N/A 03/14/2022   Procedure: IRRIGATION AND DEBRIDEMENT PERIRECTAL ABSCESS;  Surgeon: Karie Soda, MD;  Location: [facility];  Service: General;  Laterality: N/A;   LAPAROSCOPY N/A 02/10/2023   Procedure: LAPAROSCOPY DIAGNOSTIC;  Surgeon: [place] Bing, MD;  Location: [facility];  Service: Gynecology;  Laterality: N/A;   TONSILLECTOMY AND ADENOIDECTOMY     AGE 36   WISDOM TOOTH EXTRACTION  03/18/2021   WRIST GANGLION EXCISION Left 02/2020   Social History   Social History Narrative   Not on file   Immunization History  Administered Date(s) Administered   HPV 9-valent 02/19/2012, 12/19/2012, 05/01/2013   Influenza,inj,Quad PF,6+ Mos 06/15/2018   Meningococcal Mcv4o 11/21/2018   Moderna Sars-Covid-2 Vaccination 11/22/2019, 12/20/2019     Objective: Vital Signs: BP 102/71 (BP Location: Left Arm, Patient Position: Sitting, Cuff Size: Large)   Pulse 65   Resp 14   Ht 5\' 8"  (1.727 m)   Wt 259 lb 12.8 oz (117.8 kg)   LMP  (LMP Unknown)   BMI 39.50 kg/m    Physical Exam Constitutional:      Appearance: She is obese.  Cardiovascular:     Rate and Rhythm: Normal rate and regular rhythm.  Pulmonary:     Effort: Pulmonary effort is normal.     Breath sounds: Normal breath sounds.  Musculoskeletal:     Right lower leg: No edema.     Left lower leg: No edema.  Lymphadenopathy:     Cervical: No cervical adenopathy.  Skin:    General: Skin is warm and dry.     Comments: Acanthosis  Neurological:     Mental Status: She is alert.  Psychiatric:        Mood and Affect: Mood normal.      Musculoskeletal Exam:  Shoulders full ROM no tenderness or swelling Widespread tenderness to pressure throughout upper and lower back Elbows full ROM no tenderness or swelling Left wrist cyst, no palpable swelling, full ROM Fingers full ROM no tenderness or swelling Knees full ROM no tenderness or swelling Ankles full ROM tenderness to pressure and with movement, no palpable effusion  Investigation: No additional findings.  Imaging: CT ABDOMEN PELVIS W CONTRAST Result Date: 09/08/2023 CLINICAL DATA:  Acute generalized abdominal pain. EXAM: CT ABDOMEN AND PELVIS WITH CONTRAST TECHNIQUE: Multidetector CT imaging of the abdomen and pelvis was performed using  the standard protocol following bolus administration of intravenous contrast. RADIATION DOSE REDUCTION: This exam was performed according to the departmental dose-optimization program which includes automated exposure control, adjustment of the mA and/or kV according to patient size and/or use of iterative reconstruction technique. CONTRAST:  80mL OMNIPAQUE IOHEXOL 300 MG/ML  SOLN COMPARISON:  March 30, 2023. FINDINGS: Lower chest: No acute abnormality. Hepatobiliary: No  focal liver abnormality is seen. No gallstones, gallbladder wall thickening, or biliary dilatation. Pancreas: Unremarkable. No pancreatic ductal dilatation or surrounding inflammatory changes. Spleen: Normal in size without focal abnormality. Adrenals/Urinary Tract: Adrenal glands are unremarkable. Kidneys are normal, without renal calculi, focal lesion, or hydronephrosis. Bladder is unremarkable. Stomach/Bowel: Stomach is within normal limits. Appendix appears normal. No evidence of bowel wall thickening, distention, or inflammatory changes. Vascular/Lymphatic: No significant vascular findings are present. No enlarged abdominal or pelvic lymph nodes. Reproductive: Uterus and bilateral adnexa are unremarkable. Other: No abdominal wall hernia or abnormality. No abdominopelvic ascites. Musculoskeletal: No acute or significant osseous findings. IMPRESSION: No abnormality seen in the abdomen or pelvis. Electronically Signed   By: Lupita Raider M.D.   On: 09/08/2023 10:13    Recent Labs: Lab Results  Component Value Date   WBC 8.3 09/08/2023   HGB 11.2 (L) 09/08/2023   PLT 224 09/08/2023   NA 137 09/08/2023   K 3.6 09/08/2023   CL 104 09/08/2023   CO2 26 09/08/2023   GLUCOSE 101 (H) 09/08/2023   BUN 7 09/08/2023   CREATININE 0.78 09/08/2023   BILITOT 0.2 09/08/2023   ALKPHOS 70 09/08/2023   AST 10 (L) 09/08/2023   ALT 11 09/08/2023   PROT 7.2 09/08/2023   ALBUMIN 3.8 09/08/2023   CALCIUM 9.0 09/08/2023   GFRAA 147 08/28/2020     Speciality Comments: No specialty comments available.  Procedures:  No procedures performed Allergies: Blue dyes (parenteral), Blueberry [vaccinium angustifolium], Raspberry, Shellfish allergy, Amitriptyline, and Sumatriptan   Assessment / Plan:     Visit Diagnoses: Seronegative inflammatory arthritis - Previous trial of Plaquenil without significant improvement. If inflammation persists and improvement noted with Prednisone, consider starting Methotrexate. - Plan: Sedimentation rate, B. burgdorfi antibodies, hydroxychloroquine (PLAQUENIL) 200 MG tablet Positive ANA (antinuclear antibody) Rechecking sed rate for inflammation monitoring. Also at patient questioning alternative etiology screening Lyme Ab.  -Continue HCQ 200 mg daily, if inflammatory markers still high titration   Fibromyalgia syndrome - Plan: cyclobenzaprine (FLEXERIL) 10 MG tablet, Ambulatory referral to Neurology Symptoms consistent with fibromyalgia, including widespread muscle pain, severe fatigue, and irritable bowel symptoms. Current treatment includes duloxetine, with plans to increase dosage. Medications typically provide about 50% improvement, with additional lifestyle modifications needed for optimal management. - Prescribe Flexeril for muscle pain and as a sleep aid. - Recommend referral to sleep medicine specialist for OSA screenin - Provide educational resources on fibromyalgia management from [facility] [place] Department of Rheumatology (fibroguide)  Vitamin D deficiency - Plan: VITAMIN D 25 Hydroxy (Vit-D Deficiency, Fractures) Previous severely low of 4.6. On high dose supplement x6 months plan to recheck.  Joint Pain and Stiffness Persistent joint pain and stiffness, particularly in the morning. Previous treatment with Plaquenil and a steroid taper showed minimal improvement. Symptoms may be partially related to fibromyalgia. Stronger anti-inflammatory treatment unlikely to provide significant  additional benefit. - Monitor joint symptoms and reassess need for stronger anti-inflammatory treatment if symptoms worsen.  Abdominal Pain - Plan: Amb Ref to Medical Weight Management Chronic abdominal pain with associated nausea, diarrhea, and constipation. Previous CT scan and endoscopy did not reveal significant findings. Symptoms suggestive of irritable bowel syndrome (IBS) with possible nerve-related etiology. Current management includes Mytesi and omeprazole, with no significant improvement reported. - Review current plan for abdominal pain with gastroenterologist, including potential ultrasound for gallbladder evaluation. - Consider referral to dietitian for dietary management of IBS symptoms. - Check for tick-borne illness as a differential diagnosis.  Carpal Tunnel  Syndrome Diagnosed with carpal tunnel syndrome in one hand, with numbness and tingling in both hands. Awaiting follow-up with hand surgeon for further management. - Follow up with hand surgeon for carpal tunnel management.    Orders: Orders Placed This Encounter  Procedures   Sedimentation rate   B. burgdorfi antibodies   VITAMIN D 25 Hydroxy (Vit-D Deficiency, Fractures)   Ambulatory referral to Neurology   Amb Ref to Medical Weight Management   Meds ordered this encounter  Medications   cyclobenzaprine (FLEXERIL) 10 MG tablet    Sig: Take 1 tablet (10 mg total) by mouth at bedtime.    Dispense:  30 tablet    Refill:  2   hydroxychloroquine (PLAQUENIL) 200 MG tablet    Sig: Take 2 tablets (400 mg total) by mouth daily.    Dispense:  180 tablet    Refill:  0     Follow-Up Instructions: Return in about 3 months (around 01/07/2024) for ?RA/UCTD/FMS on HCQ/flexeril f/u 3mos.   Fuller Plan, MD  Note - This record has been created using [employer].  Chart creation errors have been sought, but may not always  have been located. Such creation errors do not reflect on  the standard of medical care.

## 2023-10-07 ENCOUNTER — Encounter: Payer: Self-pay | Admitting: Internal Medicine

## 2023-10-07 ENCOUNTER — Ambulatory Visit: Payer: Medicaid Other | Attending: Internal Medicine | Admitting: Internal Medicine

## 2023-10-07 VITALS — BP 102/71 | HR 65 | Resp 14 | Ht 68.0 in | Wt 259.8 lb

## 2023-10-07 DIAGNOSIS — M2550 Pain in unspecified joint: Secondary | ICD-10-CM | POA: Diagnosis not present

## 2023-10-07 DIAGNOSIS — R109 Unspecified abdominal pain: Secondary | ICD-10-CM

## 2023-10-07 DIAGNOSIS — Z79899 Other long term (current) drug therapy: Secondary | ICD-10-CM | POA: Diagnosis not present

## 2023-10-07 DIAGNOSIS — M1380 Other specified arthritis, unspecified site: Secondary | ICD-10-CM | POA: Diagnosis not present

## 2023-10-07 DIAGNOSIS — E559 Vitamin D deficiency, unspecified: Secondary | ICD-10-CM

## 2023-10-07 DIAGNOSIS — R768 Other specified abnormal immunological findings in serum: Secondary | ICD-10-CM

## 2023-10-07 DIAGNOSIS — R102 Pelvic and perineal pain: Secondary | ICD-10-CM

## 2023-10-07 DIAGNOSIS — M797 Fibromyalgia: Secondary | ICD-10-CM

## 2023-10-07 DIAGNOSIS — R634 Abnormal weight loss: Secondary | ICD-10-CM

## 2023-10-07 MED ORDER — CYCLOBENZAPRINE HCL 10 MG PO TABS: 10.0000 mg | ORAL_TABLET | Freq: Every day | ORAL | 2 refills | Status: DC

## 2023-10-07 MED ORDER — HYDROXYCHLOROQUINE SULFATE 200 MG PO TABS: 400.0000 mg | ORAL_TABLET | Freq: Every day | ORAL | 0 refills | Status: DC

## 2023-10-07 NOTE — Patient Instructions (Signed)
 I recommend checking out the [employer] [place] patient-centered guide for fibromyalgia and chronic pain management: [email or web address]

## 2023-10-08 LAB — VITAMIN D 25 HYDROXY (VIT D DEFICIENCY, FRACTURES): Vit D, 25-Hydroxy: 26 ng/mL — ABNORMAL LOW (ref 30–100)

## 2023-10-08 LAB — SEDIMENTATION RATE: Sed Rate: 29 mm/h — ABNORMAL HIGH (ref 0–20)

## 2023-10-08 LAB — B. BURGDORFI ANTIBODIES: B burgdorferi Ab IgG+IgM: <0.9 {index}

## 2023-10-13 ENCOUNTER — Other Ambulatory Visit: Payer: Self-pay | Admitting: Physician Assistant

## 2023-10-13 ENCOUNTER — Other Ambulatory Visit: Payer: Self-pay | Admitting: Internal Medicine

## 2023-10-13 DIAGNOSIS — R109 Unspecified abdominal pain: Secondary | ICD-10-CM

## 2023-10-13 DIAGNOSIS — M1380 Other specified arthritis, unspecified site: Secondary | ICD-10-CM

## 2023-10-15 ENCOUNTER — Encounter: Payer: Self-pay | Admitting: Physician Assistant

## 2023-10-18 ENCOUNTER — Ambulatory Visit
Admission: RE | Admit: 2023-10-18 | Discharge: 2023-10-18 | Disposition: A | Discharge status: Home / Self Care | Source: Ambulatory Visit | Attending: Physician Assistant | Admitting: Physician Assistant

## 2023-10-18 DIAGNOSIS — R109 Unspecified abdominal pain: Secondary | ICD-10-CM

## 2023-10-20 NOTE — Progress Notes (Deleted)
 GYNECOLOGY VISIT  Patient name: Jacqueline Gonzalez MRN [id number]  Date of birth: 01/16/2001 Chief Complaint:   No chief complaint on file.   History:  Jacqueline Gonzalez is a 23 y.o. G0P0000 being seen today for ***.    Last seeen 08/24/23 by KF: oligomenorrehat with amenorreha x11 months in the setting of known PCOS. CPP with dyspareunia. Prior laparaoscopy negative. CT A/P[ & PUS in 2024 negative.   Past Medical History:  Diagnosis Date   Anemia    Anxiety    Arthritis    knees   Chlamydia 11/13/2021   Constipation, chronic    Depression    Dyspnea    ON OCC   Environmental allergies    Ganglion cyst of dorsum of left wrist    RECURRENT   GERD (gastroesophageal reflux disease)    watches diet   Headache MIGRAINE    History of hypertension    IN PAST, NO RECENT HTN PER PT 01-19-2023   IBS (irritable bowel syndrome)    Lactose intolerance    PCOS (polycystic ovarian syndrome)    Pre-diabetes     Past Surgical History:  Procedure Laterality Date   GANGLION CYST EXCISION Left 04/24/2021   Procedure: left recurrent dorsal carpal ganglion excision;  Surgeon: Gomez Cleverly, MD;  Location: [facility] [place];  Service: Orthopedics;  Laterality: Left;   INCISION AND DRAINAGE PERIRECTAL ABSCESS N/A 03/14/2022   Procedure: IRRIGATION AND DEBRIDEMENT PERIRECTAL ABSCESS;  Surgeon: Karie Soda, MD;  Location: [facility];  Service: General;  Laterality: N/A;   LAPAROSCOPY N/A 02/10/2023   Procedure: LAPAROSCOPY DIAGNOSTIC;  Surgeon: [place] Bing, MD;  Location: [facility] [place];  Service: Gynecology;  Laterality: N/A;   TONSILLECTOMY AND ADENOIDECTOMY     AGE 35   WISDOM TOOTH EXTRACTION  03/18/2021   WRIST GANGLION EXCISION Left 02/2020    The following portions of the patient's history were reviewed and updated as appropriate: allergies, current medications, past family history, past medical history, past social history, past surgical history and problem list.    Health Maintenance:   Last pap     Component Value Date/Time   DIAGPAP - Low grade squamous intraepithelial lesion (LSIL) (A) 08/24/2023 1424   DIAGPAP - Low grade squamous intraepithelial lesion (LSIL) (A) 08/18/2022 1131   ADEQPAP  08/24/2023 1424    Satisfactory for evaluation; transformation zone component PRESENT.   ADEQPAP  08/18/2022 1131    Satisfactory for evaluation; transformation zone component PRESENT.    High Risk HPV: Positive  Adequacy:  Satisfactory for evaluation, transformation zone component PRESENT  Diagnosis:  Atypical squamous cells of undetermined significance (ASC-US)  Last mammogram: n/a   Review of Systems:  {Ros - complete:30496} Comprehensive review of systems was otherwise negative.   Objective:  Physical Exam There were no vitals taken for this visit.   Physical Exam   Labs and Imaging CT ABDOMEN PELVIS WO CONTRAST Result Date: 10/18/2023 CLINICAL DATA:  Diffuse abdominal pain for 1 year. EXAM: CT ABDOMEN AND PELVIS WITHOUT CONTRAST TECHNIQUE: Multidetector CT imaging of the abdomen and pelvis was performed following the standard protocol without IV contrast. RADIATION DOSE REDUCTION: This exam was performed according to the departmental dose-optimization program which includes automated exposure control, adjustment of the mA and/or kV according to patient size and/or use of iterative reconstruction technique. COMPARISON:  CT abdomen pelvis dated 09/08/2023. FINDINGS: Evaluation of this exam is limited in the absence of intravenous contrast. Lower chest: The visualized lung bases are clear. No  intra-abdominal free air or free fluid. Hepatobiliary: The liver is unremarkable. No biliary dilatation. The gallbladder is unremarkable Pancreas: Unremarkable. No pancreatic ductal dilatation or surrounding inflammatory changes. Spleen: Normal in size without focal abnormality. Adrenals/Urinary Tract: The adrenal glands, kidneys, visualized ureters, and  urinary bladder appear unremarkable. Stomach/Bowel: Mild thickened appearance of the wall of the transverse colon may be related to underdistention. Mild colitis is less likely but not excluded. Clinical correlation is recommended. There is no bowel obstruction. The appendix is normal. Vascular/Lymphatic: The abdominal aorta and IVC are unremarkable no portal venous gas. There is no adenopathy. Reproductive: The uterus is retroflexed. No suspicious adnexal masses. Other: None Musculoskeletal: No acute or significant osseous findings. IMPRESSION: Underdistention of the colon versus less likely mild colitis. Clinical correlation is recommended. No bowel obstruction. Normal appendix. Electronically Signed   By: Elgie Collard M.D.   On: 10/18/2023 10:15       Assessment & Plan:   There are no diagnoses linked to this encounter.   *** Routine preventative health maintenance measures emphasized.  Lorriane Shire, MD [facility] for [facility], [facility]

## 2023-10-21 ENCOUNTER — Ambulatory Visit: Payer: Medicaid Other | Admitting: Obstetrics and Gynecology

## 2023-10-21 ENCOUNTER — Other Ambulatory Visit: Payer: Self-pay

## 2023-10-21 ENCOUNTER — Ambulatory Visit: Admitting: Obstetrics and Gynecology

## 2023-10-21 VITALS — BP 110/74 | HR 67 | Wt 259.0 lb

## 2023-10-21 DIAGNOSIS — N911 Secondary amenorrhea: Secondary | ICD-10-CM

## 2023-10-21 DIAGNOSIS — R1030 Lower abdominal pain, unspecified: Secondary | ICD-10-CM | POA: Diagnosis not present

## 2023-10-21 MED ORDER — MEDROXYPROGESTERONE ACETATE 10 MG PO TABS
10.0000 mg | ORAL_TABLET | Freq: Every day | ORAL | 0 refills | Status: AC
Start: 2023-10-21 — End: ?

## 2023-10-21 NOTE — Progress Notes (Signed)
 GYNECOLOGY VISIT  Patient name: Jacqueline Gonzalez MRN [id number]  Date of birth: 2000/11/21 Chief Complaint:   Pelvic Pain   History:  Jacqueline Gonzalez is a 23 y.o. G0P0000 being seen today for possible abdominal wall trigger point injections. She  2 years of chronic pelvic pain, 7/10 - 10/10. Ibuprofen with minimal relief. Reports havin girregular periods; is taking birth control pills; currently tking slyind, not taking provera  Discussed the use of AI scribe software for clinical note transcription with the patient, who gave verbal consent to proceed.  History of Present Illness Jacqueline Gonzalez is a 23 year old female who presents with irregular menstrual cycles and prolonged amenorrhea.  She has experienced prolonged amenorrhea since March 2024. She was previously prescribed Provera to induce menstruation but did not receive the prescription. There is uncertainty about whether the prescription was filled. She has not started [facility], a birth control pill, due to not having taken Provera yet.  She reports irregular menstrual cycles, which have been a concern alongside the prolonged amenorrhea.  She has a long-standing history of lower abdominal and pelvic pain, described as diffuse and not localized to a specific area. She has recently been prescribed Flexeril, which she has just started taking, and is uncertain about its effectiveness at this time.  Last seeen 08/24/23 by KF: oligomenorreha with amenorreha x11 months in the setting of known PCOS. CPP with dyspareunia. Prior laparaoscopy negative. CT A/P & PUS in 2024 negative. Prescribed slynd.prescirbed lidocain for dyspareunia; prescribed provera to induce menses and sylnd thereafter for endometrial protection. Pap LSIL, recommend repeat in 1 year  Pertinent labs: estradiol 42.2, FSH 5.5, testosterone 41, TSH, prolactin 15  Past Medical History:  Diagnosis Date   Anemia    Anxiety    Arthritis    knees   Chlamydia 11/13/2021    Constipation, chronic    Depression    Dyspnea    ON OCC   Environmental allergies    Ganglion cyst of dorsum of left wrist    RECURRENT   GERD (gastroesophageal reflux disease)    watches diet   Headache MIGRAINE    History of hypertension    IN PAST, NO RECENT HTN PER PT 01-19-2023   IBS (irritable bowel syndrome)    Lactose intolerance    PCOS (polycystic ovarian syndrome)    Pre-diabetes     Past Surgical History:  Procedure Laterality Date   GANGLION CYST EXCISION Left 04/24/2021   Procedure: left recurrent dorsal carpal ganglion excision;  Surgeon: Gomez Cleverly, MD;  Location: [facility] [place];  Service: Orthopedics;  Laterality: Left;   INCISION AND DRAINAGE PERIRECTAL ABSCESS N/A 03/14/2022   Procedure: IRRIGATION AND DEBRIDEMENT PERIRECTAL ABSCESS;  Surgeon: Karie Soda, MD;  Location: [facility];  Service: General;  Laterality: N/A;   LAPAROSCOPY N/A 02/10/2023   Procedure: LAPAROSCOPY DIAGNOSTIC;  Surgeon: [place] Bing, MD;  Location: [facility] [place];  Service: Gynecology;  Laterality: N/A;   TONSILLECTOMY AND ADENOIDECTOMY     AGE 70   WISDOM TOOTH EXTRACTION  03/18/2021   WRIST GANGLION EXCISION Left 02/2020    The following portions of the patient's history were reviewed and updated as appropriate: allergies, current medications, past family history, past medical history, past social history, past surgical history and problem list.   Health Maintenance:   Last pap     Component Value Date/Time   DIAGPAP - Low grade squamous intraepithelial lesion (LSIL) (A) 08/24/2023 1424   DIAGPAP - Low grade squamous  intraepithelial lesion (LSIL) (A) 08/18/2022 1131   ADEQPAP  08/24/2023 1424    Satisfactory for evaluation; transformation zone component PRESENT.   ADEQPAP  08/18/2022 1131    Satisfactory for evaluation; transformation zone component PRESENT.    High Risk HPV: Positive  Adequacy:  Satisfactory for evaluation, transformation zone  component PRESENT  Diagnosis:  Atypical squamous cells of undetermined significance (ASC-US)  Last mammogram: n/a   Review of Systems:  Pertinent items are noted in HPI. Comprehensive review of systems was otherwise negative.   Objective:  Physical Exam BP 110/74   Pulse 67   Wt 259 lb (117.5 kg)   BMI 39.38 kg/m    Physical Exam Vitals and nursing note reviewed.  Constitutional:      Appearance: Normal appearance.  HENT:     Head: Normocephalic and atraumatic.  Pulmonary:     Effort: Pulmonary effort is normal.  Abdominal:     Comments: Multiple trigger points Positive [place]  Skin:    General: Skin is warm and dry.  Neurological:     General: No focal deficit present.     Mental Status: She is alert.  Psychiatric:        Mood and Affect: Mood normal.        Behavior: Behavior normal.        Thought Content: Thought content normal.        Judgment: Judgment normal.     Abdominal Trigger Point Procedure Patient identified, informed consent performed, consent signed. The abdominal wall was examined and 9 points were identified and marked. Each site was cleaned with alcohol wipes. Each site was sprayed with hurricane spray and the needle introduced into the abdominal wall. Once the trigger point was confirmed to be entered by patient report of pain, 1cc of 1% lidocaine was injected and then pressure held at the injection site. The patient tolerated the procedure well and was given post procedure instructions.      Assessment & Plan:   Assessment & Plan Amenorrhea Amenorrhea since March 2024 raises risk for endometrial hyperplasia. Provera prescribed to induce withdrawal bleed, crucial for resetting endometrial lining and assessing hypothalamic-pituitary-ovarian axis function. - Resend Provera prescription to [facility] on [address]. - Instruct her to confirm receipt of Provera with the pharmacy. - Educated on necessity of withdrawal bleed and then continuation of  slynd for endometrial protection.  Chronic lower abdominal and pelvic pain Chronic diffuse lower abdominal and pelvic pain may be associated with abdominal trigger points. Flexeril prescribed, efficacy undetermined - Now s/p uncomplicated abdominal wall trigger point injections - Evaluate Flexeril's effectiveness for pain management. - Follow up in 6 weeks to assess response   Routine preventative health maintenance measures emphasized.  Lorriane Shire, MD [facility] for [facility], [facility]

## 2023-11-04 ENCOUNTER — Encounter: Payer: Self-pay | Admitting: Neurology

## 2023-11-04 ENCOUNTER — Ambulatory Visit (INDEPENDENT_AMBULATORY_CARE_PROVIDER_SITE_OTHER): Admitting: Neurology

## 2023-11-04 VITALS — BP 117/73 | HR 91 | Ht 68.0 in | Wt 268.0 lb

## 2023-11-04 DIAGNOSIS — G4701 Insomnia due to medical condition: Secondary | ICD-10-CM

## 2023-11-04 DIAGNOSIS — G894 Chronic pain syndrome: Secondary | ICD-10-CM

## 2023-11-04 DIAGNOSIS — G9332 Myalgic encephalomyelitis/chronic fatigue syndrome: Secondary | ICD-10-CM

## 2023-11-04 DIAGNOSIS — Z9189 Other specified personal risk factors, not elsewhere classified: Secondary | ICD-10-CM

## 2023-11-04 DIAGNOSIS — F5113 Hypersomnia due to other mental disorder: Secondary | ICD-10-CM

## 2023-11-04 DIAGNOSIS — G8929 Other chronic pain: Secondary | ICD-10-CM | POA: Insufficient documentation

## 2023-11-04 NOTE — Patient Instructions (Signed)
   Healthy Living: Sleep In this video, you will learn why sleep is an important part of a healthy lifestyle. To view the content, go to this web address: [email or web address]  This video will expire on: 06/30/2025. If you need access to this video following this date, please reach out to the healthcare provider who assigned it to you. This information is not intended to replace advice given to you by your health care provider. Make sure you discuss any questions you have with your health care provider. [employer]vier Patient Education  2024 [employer] ASSESSMENT AND PLAN 23 y.o. 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 It would help to initiate a sleep time ( a bedtime  and a rise time and regular meal times. )   2) I will certainly evaluate for OSA due to the manifold presence of risk factors. Obesity,  retrognathia,  large tongue,  neck size.   3) high fatigue can be due to  inflammation, depression, chronic pain.  She has all these present. Onset of depression at age 43, dx at age 66.  Anemia is improving ,see labs.   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  Check on narcolepsy markers.   I will order a HST as the patient will not likely sleep reliably in the opennig hours of a sleep lab.  WATCH pat.   I plan to follow up through our NP within 6 months.

## 2023-11-04 NOTE — Progress Notes (Signed)
 SLEEP MEDICINE CLINIC    Provider:  Melvyn Novas, MD  Primary Care Physician:  Levonne Lapping, NP [phone] [place] [address]     Referring Provider:  Rheumatology:   Fuller Plan, Md [address] [place],  [place] [address]     Primary neurologist is Dr Adriana Mccallum, DO        Chief Complaint according to patient   Patient presents with:     New Patient (Initial Visit)           HISTORY OF PRESENT ILLNESS:  Jacqueline Gonzalez is a 23 y.o. female patient who is seen upon  referral on 11/04/2023 from Rheumatologist  for a sleep evaluation; The expectation is to rule out an organic sleep disorder.  [name] has had for many years since middle school abdominal pain, but she also developed other regions of pain including headaches neck pain joint pain and chest pain.  She has been seen by Sheliah Hatch her rheumatologist.  Her headache disorder is treated by her primary neurologist Dr. Everlena Cooper, DO.  Her primary care provider is a Publishing rights manager, city block, nurse practitioner [place].  Pain is affecting her sleep, the ability to sleep through the night.  She feels it interferes with initiation of sleep, sometimes sleep is delayed to 6 AM . She sleeps however up to 10-12 hours a day alternating with 5-6 hours on some nights , this is cyclic insomnia/ hypersomnia, a pattern familiar to  mental health professionals.   .  Chief concern according to patient :  see above    I have the pleasure of seeing Jacqueline Gonzalez on 11/04/23 a right -handed young AA  female with a chronic  whole body affecting pain disorder. She reports having arthritis. The patient had no previous sleep study.   Sleep relevant medical history:  seizures as a baby,  no Nocturia, , no Sleep walking, but  sleep talking, gasping for air,  Obesity, had a Tonsillectomy with adenoidectomy at age 47 ,  always  was sleepy when  a passenger , fell asleep in class, but she can't go to  sleep at night  now.   Hypersomnia in daytime and insomnia at night with variable sleep times ( usually non- organic ) .  deviated septum .    Family medical /sleep history:  maternal aunt  with OSA,  aunt  with insomnia, father is insomnia.    Social history:  Patient is working as a Consulting civil engineer , single, and lives in a household with GF. One cat and one dog .  Tobacco use: none .  ETOH use ; rare , less than 1 / week Caffeine intake in form of Coffee( /) Soda( /) Tea ( /) or energy drinks Exercise in form of  squats , pain limited .   Hobbies :fishing       Sleep habits are as follows: The patient's dinner time is irregular, can be as late as  12 - 2 AM,  sleep onset  at 4-6 AM, continues to sleep for 6-12  hours, wakes for 1 bathroom break.  The preferred sleep position is variable - non supine, due to her large breasts.  with the support of 2 pillows. Dreams are reportedly frequent/vivid-  Some nightmarish   The patient wakes up with difficulties , 6 alarms to be punctual for this appointment .  There is no  usual rise time. He/ She reports not feeling refreshed or  restored in AM, with symptoms such as dry mouth, morning headaches- often severe but neck ain, joint pain, abdominal pain.  Nausea., and residual fatigue. Naps are taken frequently,  but not scheduled- lasting from 2-3  hours  and are interfering with nocturnal sleep.    Review of Systems: Out of a complete 14 system review, the patient complains of only the following symptoms, and all other reviewed systems are negative.:  Fatigue, sleepiness , snoring, fragmented sleep, Insomnia, hypersomnia, poor sleep routines.     How likely are you to doze in the following situations: 0 = not likely, 1 = slight chance, 2 = moderate chance, 3 = high chance   Sitting and Reading? Watching Television? Sitting inactive in a public place (theater or meeting)? As a passenger in a car for an hour without a break? Lying down in the afternoon  when circumstances permit? Sitting and talking to someone? Sitting quietly after lunch without alcohol? In a car, while stopped for a few minutes in traffic?   Total = 15/ 24 points   FSS endorsed at 56/ 63 points.   Positive for depression. Neck pain.   Body inflammation.  Social History   Socioeconomic History   Marital status: Single    Spouse name: Not on file   Number of children: 0   Years of education: Not on file   Highest education level: Not on file  Occupational History   Occupation: student  Tobacco Use   Smoking status: Never    Passive exposure: Past   Smokeless tobacco: Never  Vaping Use   Vaping status: Never Used  Substance and Sexual Activity   Alcohol use: Yes    Comment: occ   Drug use: Never    Comment: Smokes CBD occ   Sexual activity: Yes    Birth control/protection: OCP  Other Topics Concern   Not on file  Social History Narrative   Not on file   Social Drivers of Health   Financial Resource Strain: Not on file  Food Insecurity: Food Insecurity Present (10/21/2023)   Hunger Vital Sign    Worried About Running Out of Food in the Last Year: Often true    Ran Out of Food in the Last Year: Often true  Transportation Needs: Unmet Transportation Needs (10/21/2023)   PRAPARE - Transportation    Lack of Transportation (Medical): Yes    Lack of Transportation (Non-Medical): Yes  Physical Activity: Not on file  Stress: No Stress Concern Present (07/04/2021)   [employer] of Occupational Health - Occupational Stress Questionnaire    Feeling of Stress : Only a little  Recent Concern: Stress - Stress Concern Present (06/20/2021)   [employer] of Occupational Health - Occupational Stress Questionnaire    Feeling of Stress : To some extent  Social Connections: Unknown (03/31/2023)   Received from [facility]   Social Network    Social Network: Not on file    Family History  Problem Relation Age of Onset   Heart failure Mother     Migraines Mother    Irritable bowel syndrome Mother    Other Mother 26       Tricuspid valve regurgitation   Asthma Father    Ovarian cancer Maternal Aunt    Graves' disease Paternal Aunt    Hypertension Maternal Grandmother    Diabetes Maternal Grandmother    Hypertension Maternal Grandfather    Diabetes Maternal Grandfather    Hypertension Paternal Grandmother    Stroke Paternal Grandmother  Diabetes Paternal Grandmother    Hypertension Paternal Grandfather    Diabetes Paternal Grandfather    Kidney disease Paternal Grandfather    Stomach cancer Neg Hx    Esophageal cancer Neg Hx    Colon cancer Neg Hx     Past Medical History:  Diagnosis Date   Anemia    Anxiety    Arthritis    knees   Chlamydia 11/13/2021   Constipation, chronic    Depression    Dyspnea    ON OCC   Environmental allergies    Ganglion cyst of dorsum of left wrist    RECURRENT   GERD (gastroesophageal reflux disease)    watches diet   Headache MIGRAINE    History of hypertension    IN PAST, NO RECENT HTN PER PT 01-19-2023   IBS (irritable bowel syndrome)    Lactose intolerance    PCOS (polycystic ovarian syndrome)    Pre-diabetes     Past Surgical History:  Procedure Laterality Date   GANGLION CYST EXCISION Left 04/24/2021   Procedure: left recurrent dorsal carpal ganglion excision;  Surgeon: Ltanya Rummer, MD;  Location: [facility] [place];  Service: Orthopedics;  Laterality: Left;   INCISION AND DRAINAGE PERIRECTAL ABSCESS N/A 03/14/2022   Procedure: IRRIGATION AND DEBRIDEMENT PERIRECTAL ABSCESS;  Surgeon: Candyce Champagne, MD;  Location: [facility];  Service: General;  Laterality: N/A;   LAPAROSCOPY N/A 02/10/2023   Procedure: LAPAROSCOPY DIAGNOSTIC;  Surgeon: Raynell Caller, MD;  Location: [facility];  Service: Gynecology;  Laterality: N/A;   TONSILLECTOMY AND ADENOIDECTOMY     AGE 36   WISDOM TOOTH EXTRACTION  03/18/2021   WRIST GANGLION EXCISION Left 02/2020      Current Outpatient Medications on File Prior to Visit  Medication Sig Dispense Refill   ACCRUFER 30 MG CAPS TAKE 1 CAPSULE BY MOUTH TWICE A DAY BEFORE MEALS FOR ANEMIA.     buPROPion (WELLBUTRIN XL) 150 MG 24 hr tablet Take by mouth.     cetirizine (ZYRTEC) 10 MG tablet Take 1 tablet (10 mg total) by mouth daily. 30 tablet 0   clindamycin (CLEOCIN) 300 MG capsule Take 1 capsule (300 mg total) by mouth 3 (three) times daily. 30 capsule 0   cyclobenzaprine (FLEXERIL) 10 MG tablet Take 1 tablet (10 mg total) by mouth at bedtime. 30 tablet 2   Drospirenone (SLYND) 4 MG TABS Take 1 tablet (4 mg total) by mouth daily. 90 tablet 3   DULoxetine (CYMBALTA) 30 MG capsule Take 30 mg by mouth every morning.     famotidine (PEPCID) 40 MG tablet Take 1 tablet (40 mg total) by mouth at bedtime. 30 tablet 3   fluticasone (FLONASE) 50 MCG/ACT nasal spray Place 2 sprays into both nostrils daily. 16 g 0   gabapentin (NEURONTIN) 100 MG capsule Take 1 capsule (100 mg total) by mouth 3 (three) times daily. 30 capsule 0   gabapentin (NEURONTIN) 300 MG capsule Take 300 mg by mouth as needed.     Galcanezumab-gnlm (EMGALITY Col[place]) Inject 1 Dose into the skin every 30 (thirty) days.     hydroxychloroquine (PLAQUENIL) 200 MG tablet Take 2 tablets (400 mg total) by mouth daily. 180 tablet 0   hydrOXYzine (VISTARIL) 25 MG capsule Take 25 mg by mouth 2 (two) times daily as needed.     ibuprofen (ADVIL) 600 MG tablet Take 1 tablet (600 mg total) by mouth every 6 (six) hours as needed. 30 tablet 0   lidocaine (XYLOCAINE) 5 %  ointment Apply 1 Application topically as needed. 35 g 0   medroxyPROGESTERone (PROVERA) 10 MG tablet Take 1 tablet (10 mg total) by mouth daily. Use for ten days 10 tablet 0   ondansetron (ZOFRAN) 4 MG tablet Take 4 mg by mouth 2 (two) times daily as needed.     oxyCODONE-acetaminophen (PERCOCET/ROXICET) 5-325 MG tablet Take 1 tablet by mouth every 6 (six) hours as needed. 8 tablet 0   pantoprazole  (PROTONIX) 40 MG tablet Take 1 tablet (40 mg total) by mouth 2 (two) times daily before a meal. 60 tablet 6   pregabalin (LYRICA) 50 MG capsule Take 50 mg by mouth 2 (two) times daily.     propranolol (INDERAL) 60 MG tablet Take 60 mg by mouth 2 (two) times daily.     Psyllium (METAMUCIL) 48.57 % POWD 1 spoonful daily for the next 14 days     Vitamin D, Ergocalciferol, (DRISDOL) 1.25 MG (50000 UNIT) CAPS capsule Take 50,000 Units by mouth every 7 (seven) days.     VRAYLAR 1.5 MG capsule Take 1.5 mg by mouth daily.     ADVAIR DISKUS 100-50 MCG/ACT AEPB Inhale 1 puff into the lungs 2 (two) times daily. (Patient not taking: Reported on 07/09/2023)     albuterol (VENTOLIN HFA) 108 (90 Base) MCG/ACT inhaler Inhale into the lungs every 6 (six) hours as needed for wheezing or shortness of breath. (Patient not taking: Reported on 11/04/2023)     DTx App - Gastrointestinal [facility] IBS) MISC Use as directed (Patient not taking: Reported on 11/04/2023) 11 each 1   EMGALITY 120 MG/ML SOAJ Inject into the skin. (Patient not taking: Reported on 11/04/2023)     fluconazole (DIFLUCAN) 150 MG tablet Take 1 tablet (150 mg total) by mouth daily. (Patient not taking: Reported on 08/24/2023) 1 tablet 0   FLUoxetine (PROZAC) 20 MG capsule Take 60 mg by mouth every morning. (Patient not taking: Reported on 10/07/2023)     FLUoxetine (PROZAC) 40 MG capsule Take 1 capsule (40 mg total) by mouth daily. (Patient not taking: Reported on 10/21/2023) 90 capsule 3   omeprazole (PRILOSEC) 20 MG capsule Take 1 capsule (20 mg total) by mouth daily. 30 capsule 0   No current facility-administered medications on file prior to visit.    Allergies  Allergen Reactions   Blue Dyes (Parenteral) Anaphylaxis   Blueberry [Vaccinium Angustifolium] Anaphylaxis   Raspberry Anaphylaxis   Shellfish Allergy Anaphylaxis    ALL SHELLFISH   Amitriptyline Other (See Comments)    Fast heart rate and throat closing   Sumatriptan Swelling    Throat  swelling/Increased HR per pt     DIAGNOSTIC DATA (LABS, IMAGING, TESTING) - I reviewed patient records, labs, notes, testing and imaging myself where available.  Lab Results  Component Value Date   WBC 8.3 09/08/2023   HGB 11.2 (L) 09/08/2023   HCT 35.6 (L) 09/08/2023   MCV 85.2 09/08/2023   PLT 224 09/08/2023      Component Value Date/Time   NA 137 09/08/2023 0830   NA 138 08/28/2020 1600   K 3.6 09/08/2023 0830   CL 104 09/08/2023 0830   CO2 26 09/08/2023 0830   GLUCOSE 101 (H) 09/08/2023 0830   BUN 7 09/08/2023 0830   BUN 6 08/28/2020 1600   CREATININE 0.78 09/08/2023 0830   CALCIUM 9.0 09/08/2023 0830   PROT 7.2 09/08/2023 0830   PROT 7.6 08/28/2020 1600   ALBUMIN 3.8 09/08/2023 0830   ALBUMIN 4.3 08/28/2020 1600  AST 10 (L) 09/08/2023 0830   ALT 11 09/08/2023 0830   ALKPHOS 70 09/08/2023 0830   BILITOT 0.2 09/08/2023 0830   BILITOT 0.2 08/28/2020 1600   GFRNONAA >60 09/08/2023 0830   GFRAA 147 08/28/2020 1600   No results found for: "CHOL", "HDL", "LDLCALC", "LDLDIRECT", "TRIG", "CHOLHDL" Lab Results  Component Value Date   HGBA1C 5.1 11/27/2021   Lab Results  Component Value Date   VITAMINB12 277 03/14/2022   Lab Results  Component Value Date   TSH 1.540 08/24/2023    PHYSICAL EXAM:  Today's Vitals   11/04/23 1304  BP: 117/73  Pulse: 91  Weight: 268 lb (121.6 kg)  Height: 5\' 8"  (1.727 m)   Body mass index is 40.75 kg/m.   Wt Readings from Last 3 Encounters:  11/04/23 268 lb (121.6 kg)  10/21/23 259 lb (117.5 kg)  10/07/23 259 lb 12.8 oz (117.8 kg)     Ht Readings from Last 3 Encounters:  11/04/23 5\' 8"  (1.727 m)  10/07/23 5\' 8"  (1.727 m)  09/03/23 5\' 8"  (1.727 m)      General: The patient is awake, alert and appears not in acute distress. The patient is well groomed. Head: Normocephalic, atraumatic. Neck is supple.  Mallampati 3 plus ,  neck circumference:16.5  inches . Nasal airflow fully  patent.  Retrognathia is  seen.  Dental  status: biological  Cardiovascular:  Regular rate and cardiac rhythm by pulse,  without distended neck veins. Respiratory: Lungs are clear to auscultation.  Reports chest pain.  Skin:  Without evidence of ankle edema, or rash. Trunk: The patient's posture is stooped due to the large breasts .    NEUROLOGIC EXAM: The patient is awake and alert, oriented to place and time.   Memory subjective described as intact.  Attention span & concentration ability appears normal.  Speech is fluent,  without  dysarthria, dysphonia or aphasia.  Mood and affect are appropriate.   Cranial nerves: no loss of smell or taste reported  Pupils are equal and briskly reactive to light. Funduscopic exam deferred .  Extraocular movements in vertical and horizontal planes were intact and without nystagmus. No Diplopia. Visual fields by finger perimetry are intact. Hearing was intact to soft voice and finger rubbing.   Facial sensation intact to fine touch. Facial motor strength is symmetric and tongue and uvula move midline.  Neck ROM : rotation, tilt and flexion extension were normal for age and shoulder shrug was symmetrical.  She reports lightheadedness with neck flexion and upward gaze .   Motor exam:  Symmetric bulk, tone and ROM.   Normal tone without cog- wheeling, symmetric grip strength .   Sensory:  Fine touch, pinprick and vibration were tested  and  normal.  Proprioception tested in the upper extremities was normal.   Coordination: Rapid alternating movements in the fingers/hands were of normal speed.  The Finger-to-nose maneuver was intact without evidence of ataxia, dysmetria or tremor.   Gait and station: Patient could rise unassisted from a seated position, walked without assistive device.  Stance is of  wider base and the patient turned with 3 steps.  Toe and heel walk were deferred.  Deep tendon reflexes: in the  upper and lower extremities are symmetric and intact.  Babinski response was  deferred .    ASSESSMENT AND PLAN 23 y.o. 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 It would help to initiate a sleep time ( a bedtime  and a rise time and regular meal times. )   2) I will certainly evaluate for OSA due to the manifold presence of risk factors. Obesity,  retrognathia,  large tongue,  neck size.   3) high fatigue can be due to  inflammation, depression, chronic pain.  She has all these present. Onset of depression at age 28, dx at age 20.  Anemia is improving ,see labs.   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  Check on narcolepsy markers.   I will order a HST as the patient will not likely sleep reliably in the opennig hours of a sleep lab.  WATCH pat.   I plan to follow up through our NP within 6 months.   I would like to thank Kemp Patter, DO  and Matt Song, Md [address] [place],  [place] [address] for allowing me to meet with and to take care of this pleasant patient.    After spending a total time of  45  minutes face to face and additional time for physical and neurologic examination, review of laboratory studies,  personal review of imaging studies, reports and results of other testing and review of referral information / records as far as provided in visit,   Electronically signed by: Neomia Banner, MD 11/04/2023 1:21 PM  [facility] Board certified by The [employer] of Sleep Medicine and Diplomate of the [employer] of Sleep Medicine. Board certified In Neurology through the ABPN, Fellow of the [employer] of Neurology.

## 2023-11-15 ENCOUNTER — Ambulatory Visit: Payer: Medicaid Other | Admitting: Physician Assistant

## 2023-11-15 ENCOUNTER — Encounter: Payer: Self-pay | Admitting: Physician Assistant

## 2023-11-15 VITALS — BP 118/80 | HR 100 | Ht 68.0 in | Wt 271.0 lb

## 2023-11-15 DIAGNOSIS — R110 Nausea: Secondary | ICD-10-CM | POA: Diagnosis not present

## 2023-11-15 DIAGNOSIS — R109 Unspecified abdominal pain: Secondary | ICD-10-CM

## 2023-11-15 DIAGNOSIS — K589 Irritable bowel syndrome without diarrhea: Secondary | ICD-10-CM

## 2023-11-15 DIAGNOSIS — K219 Gastro-esophageal reflux disease without esophagitis: Secondary | ICD-10-CM

## 2023-11-15 DIAGNOSIS — G8929 Other chronic pain: Secondary | ICD-10-CM | POA: Diagnosis not present

## 2023-11-15 DIAGNOSIS — R102 Pelvic and perineal pain: Secondary | ICD-10-CM | POA: Diagnosis not present

## 2023-11-15 NOTE — Progress Notes (Signed)
 Chief Complaint: Chronic abdominal pain  HPI:     Jacqueline Gonzalez is a 23 t/o AA female with a past medical history as listed below, including IBS-constipation and multiple others, known to Dr. Cherryl Corona, who returns to clinic today for follow-up of chronic abdominal pain.  11/20/2021 pelvic ultrasound with submucosal fibroid versus polyp measuring 11 mm and numerous bilateral ovarian follicles with peripheral orientation, correlate for polycystic ovarian disease and trace fluid in the pelvis.     03/03/2022 patient seen in clinic and described that she was told that she likely had IBS with constipation daily.  She was having a bowel movement every once every 4 days or so which was hard to pass.  Linzess 72 mcg daily gave her terrible diarrhea.  Also describes seeing some bright red blood on the toilet paper mixed with stool.  She was also being worked up for PCOS.  On exam patient had an anal fissure.  She was tried on Amitiza  8 mcg twice a day with food.  Also prescribed Hyoscyamine  1 tab every 4-6 hours.  Prescribed Diltiazem  2% ointment 3 times daily x6 to 8 weeks for fissure.    03/09/2022-03/16/22 admission to the hospital for sepsis secondary to chlamydia PID and chlamydia proctocolitis associated with perirectal abscess.  She was treated with antibiotics Ceftriaxone  and Flagyl  as well as Doxycycline .  She did undergo I&D of supralevator rectal abscess on 8/26 with moderate proctitis.  Told to continue MiraLAX  twice daily.  8/25 MRI of the pelvis without contrast showed progressive rectal wall thickening suspicious for infectious/inflammatory proctocolitis.    04/16/2022 CT the abdomen pelvis with contrast of the ED for ongoing abdominal pain.  This was normal.    04/30/2022 patient seen in clinic by me and at that time having issues with diarrhea, nausea, headache and vomiting as well as some blood and rectal pain.  Noted for with addition of Dicyclomine  for the past week.  Was using Diltiazem  for  fissure.  On exam continues small posterior fissure.  At that time reviewed multiple imaging studies most recently a CT in September 2023.  Rectal exam still with posterior fissure.  Discussed EGD and colonoscopy which were scheduled with Dr. Rosaline Coma.  Continued on Diltiazem , RectiCare with lidocaine  and increase Dicyclomine  to 20 4 times daily as well as started Omeprazole  40 every morning.    05/04/2022 colonoscopy with nonbleeding internal hemorrhoids and a small anal fissure.  EGD was normal with some gastritis.  Pathology showed mild chronic gastritis.  Told to use Omeprazole  40 mg p.o. twice daily for 8 weeks.    08/25/2022 pelvic ultrasound with findings suggesting polycystic ovarian syndrome.  Her gynecologist told her he did not feel that the PCOS was causing pain.    09/24/2022 patient seen in clinic by me and did not remember taking Dicyclomine , unsure if it helped.  Increased indigestion regardless of taking omeprazole  and Famotidine .  Chronic achy pain in her lower abdomen.  Restarted on Hyoscyamine , switched to Pantoprazole  40 twice daily and continued on Famotidine .    01/2023 diagnostic laparoscopically for chronic pelvic pain with OB/GYN, only findings were adhesions.  Continued pain postsurgery.    03/30/2023 CTAP with no acute abnormality.    2//25 patient saw OB/GYN for chronic pelvic pain with no relief.    08/25/2023 patient seen in clinic by [facility]  At that time discussed that she may benefit from Cymbalta  or similar medication which can help reduce pain signals.  Also discuss alternative therapies such  as acupuncture.  At that time noted she had visited the ED several times for various complaints.  At that time continued chronic abdominal and pelvic pain that was constant with fluctuations in intensity despite Hyoscyamine  and Tylenol .  Also significant difficulty with eating due to pain and associated symptoms of acid reflux and nausea.  Patient was seeing a psychiatrist.  Plan at  that time was IBgard.  Recommended getting in touch with psychiatrist to discuss possibly of Cymbalta  given that she had a reaction to previous TCA.  Continued on Pantoprazole  40 twice daily and Famotidine  at bedtime.    09/08/2023 CBC and CMP normal.  Lipase low.    10/07/2023 vitamin D  found to be low    10/18/2023 CTAP without contrast with under distended colon versus possibly mild colitis.   4 /3/25 patient followed OB/GYN in regards to lower abdominal pain.  She had just recently started taking Flexeril  and was unsure if it was helping or not.  She was prescribed Provera  for amenorrhea.  She did have some uncomplicated abdominal wall trigger point injections.    Today, the patient reports that she had pain injections in her abdominal wall which helped for maybe 2 days and then she felt worsened abdominal pain, she is not sure if it was related or not.  Continues with her chronic abdominal pain which hinders her from working.  She is wondering what the end goal is here.  Apparently also Cymbalta  at 30 mg currently, discussing titration up over the next week with her psychiatrist.  She did try IBgard and thinks it helped her have more regular bowel movements and maybe help with the pain slightly.    Nuys fever, chills, weight loss or blood in her stool.  PREVIOUS GI WORKUP    03/30/2023 CT abdomen pelvis with contrast showed no acute abnormality   Diagnostic laparoscopic for chronic pelvic pain 01/2023 Exam under anesthesia showed normal EGBUS, vagina and cervix; patient sounded to 8cm and bimanual exam negative. On diagnostic laparoscopy, normal appearing, small, mid plane uterus; normal appearing tubes; normal ovaries with a tiny 5mm right para-tubal cyst; normal anterior and posterior cul de sacs; normal bilateral para-ovarian fossae. Also, normal appearing liver edge, and stomach edge and normal appearing appendix. Ureters also seen and traced down into the pelvis bilaterally.    EGD May 04, 2022 - Normal esophagus. Biopsied. - Gastritis. Biopsied. - Normal examined duodenum. Biopsied.   Colonoscopy May 04, 2022 - The examined portion of the ileum was normal.  - Biopsies were taken with a cold forceps from the entire colon for evaluation of microscopic colitis.  - Non-bleeding internal hemorrhoids.  - Small anal fissure found on perianal exam.   CT abdomen w/ contrast Sept 28, 2023 IMPRESSION: Normal enhanced CT scan of the abdomen and pelvis.   Pelvic US  Aug 25, 2022 IMPRESSION: Findings suggest polycystic ovary syndrome.  Otherwise unremarkable  Past Medical History:  Diagnosis Date   Anemia    Anxiety    Arthritis    knees   Chlamydia 11/13/2021   Constipation, chronic    Depression    Dyspnea    ON OCC   Environmental allergies    Ganglion cyst of dorsum of left wrist    RECURRENT   GERD (gastroesophageal reflux disease)    watches diet   Headache MIGRAINE    History of hypertension    IN PAST, NO RECENT HTN PER PT 01-19-2023   IBS (irritable bowel syndrome)  Lactose intolerance    PCOS (polycystic ovarian syndrome)    Pre-diabetes     Past Surgical History:  Procedure Laterality Date   GANGLION CYST EXCISION Left 04/24/2021   Procedure: left recurrent dorsal carpal ganglion excision;  Surgeon: Ltanya Rummer, MD;  Location: [facility];  Service: Orthopedics;  Laterality: Left;   INCISION AND DRAINAGE PERIRECTAL ABSCESS N/A 03/14/2022   Procedure: IRRIGATION AND DEBRIDEMENT PERIRECTAL ABSCESS;  Surgeon: Candyce Champagne, MD;  Location: [facility];  Service: General;  Laterality: N/A;   LAPAROSCOPY N/A 02/10/2023   Procedure: LAPAROSCOPY DIAGNOSTIC;  Surgeon: Raynell Caller, MD;  Location: [facility];  Service: Gynecology;  Laterality: N/A;   TONSILLECTOMY AND ADENOIDECTOMY     AGE 41   WISDOM TOOTH EXTRACTION  03/18/2021   WRIST GANGLION EXCISION Left 02/2020    Current Outpatient Medications  Medication Sig Dispense  Refill   ACCRUFER 30 MG CAPS TAKE 1 CAPSULE BY MOUTH TWICE A DAY BEFORE MEALS FOR ANEMIA.     ADVAIR DISKUS 100-50 MCG/ACT AEPB Inhale 1 puff into the lungs 2 (two) times daily. (Patient not taking: Reported on 07/09/2023)     albuterol  (VENTOLIN  HFA) 108 (90 Base) MCG/ACT inhaler Inhale into the lungs every 6 (six) hours as needed for wheezing or shortness of breath. (Patient not taking: Reported on 11/04/2023)     buPROPion (WELLBUTRIN XL) 150 MG 24 hr tablet Take by mouth.     cetirizine  (ZYRTEC ) 10 MG tablet Take 1 tablet (10 mg total) by mouth daily. 30 tablet 0   clindamycin  (CLEOCIN ) 300 MG capsule Take 1 capsule (300 mg total) by mouth 3 (three) times daily. 30 capsule 0   cyclobenzaprine  (FLEXERIL ) 10 MG tablet Take 1 tablet (10 mg total) by mouth at bedtime. 30 tablet 2   Drospirenone  (SLYND ) 4 MG TABS Take 1 tablet (4 mg total) by mouth daily. 90 tablet 3   DTx App - Gastrointestinal [facility] IBS) MISC Use as directed (Patient not taking: Reported on 11/04/2023) 11 each 1   DULoxetine  (CYMBALTA ) 30 MG capsule Take 30 mg by mouth every morning.     EMGALITY 120 MG/ML SOAJ Inject into the skin. (Patient not taking: Reported on 11/04/2023)     famotidine  (PEPCID ) 40 MG tablet Take 1 tablet (40 mg total) by mouth at bedtime. 30 tablet 3   fluconazole  (DIFLUCAN ) 150 MG tablet Take 1 tablet (150 mg total) by mouth daily. (Patient not taking: Reported on 08/24/2023) 1 tablet 0   FLUoxetine  (PROZAC ) 20 MG capsule Take 60 mg by mouth every morning. (Patient not taking: Reported on 10/07/2023)     FLUoxetine  (PROZAC ) 40 MG capsule Take 1 capsule (40 mg total) by mouth daily. (Patient not taking: Reported on 10/21/2023) 90 capsule 3   fluticasone  (FLONASE ) 50 MCG/ACT nasal spray Place 2 sprays into both nostrils daily. 16 g 0   gabapentin  (NEURONTIN ) 100 MG capsule Take 1 capsule (100 mg total) by mouth 3 (three) times daily. 30 capsule 0   gabapentin  (NEURONTIN ) 300 MG capsule Take 300 mg by mouth as  needed.     Galcanezumab-gnlm (EMGALITY [place]) Inject 1 Dose into the skin every 30 (thirty) days.     hydroxychloroquine  (PLAQUENIL ) 200 MG tablet Take 2 tablets (400 mg total) by mouth daily. 180 tablet 0   hydrOXYzine  (VISTARIL ) 25 MG capsule Take 25 mg by mouth 2 (two) times daily as needed.     ibuprofen  (ADVIL ) 600 MG tablet Take 1 tablet (600 mg total)  by mouth every 6 (six) hours as needed. 30 tablet 0   lidocaine  (XYLOCAINE ) 5 % ointment Apply 1 Application topically as needed. 35 g 0   medroxyPROGESTERone  (PROVERA ) 10 MG tablet Take 1 tablet (10 mg total) by mouth daily. Use for ten days 10 tablet 0   omeprazole  (PRILOSEC) 20 MG capsule Take 1 capsule (20 mg total) by mouth daily. 30 capsule 0   ondansetron  (ZOFRAN ) 4 MG tablet Take 4 mg by mouth 2 (two) times daily as needed.     oxyCODONE -acetaminophen  (PERCOCET/ROXICET) 5-325 MG tablet Take 1 tablet by mouth every 6 (six) hours as needed. 8 tablet 0   pantoprazole  (PROTONIX ) 40 MG tablet Take 1 tablet (40 mg total) by mouth 2 (two) times daily before a meal. 60 tablet 6   pregabalin (LYRICA) 50 MG capsule Take 50 mg by mouth 2 (two) times daily.     propranolol (INDERAL) 60 MG tablet Take 60 mg by mouth 2 (two) times daily.     Psyllium (METAMUCIL) 48.57 % POWD 1 spoonful daily for the next 14 days     Vitamin D , Ergocalciferol , (DRISDOL) 1.25 MG (50000 UNIT) CAPS capsule Take 50,000 Units by mouth every 7 (seven) days.     VRAYLAR 1.5 MG capsule Take 1.5 mg by mouth daily.     No current facility-administered medications for this visit.    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   Shellfish allergy Anaphylaxis 06/17/2016   Amitriptyline  Other (See Comments) 03/10/2022   Sumatriptan  Swelling 02/10/2023    Family History  Problem Relation Age of  Onset   Heart failure Mother    Migraines Mother    Irritable bowel syndrome Mother    Other Mother 42       Tricuspid valve regurgitation   Asthma Father    Ovarian cancer Maternal Aunt    Graves' disease Paternal Aunt    Hypertension Maternal Grandmother    Diabetes Maternal Grandmother    Hypertension Maternal Grandfather    Diabetes Maternal Grandfather    Hypertension Paternal Grandmother    Stroke Paternal Grandmother    Diabetes Paternal Grandmother    Hypertension Paternal Grandfather    Diabetes Paternal Grandfather    Kidney disease Paternal Grandfather    Stomach cancer Neg Hx    Esophageal cancer Neg Hx    Colon cancer Neg Hx     Social History   Socioeconomic History   Marital status: Single    Spouse name: Not on file   Number of children: 0   Years of education: Not on file   Highest education level: Not on file  Occupational History   Occupation: student  Tobacco Use   Smoking status: Never    Passive exposure: Past   Smokeless tobacco: Never  Vaping Use   Vaping status: Never Used  Substance and Sexual Activity   Alcohol use: Yes    Comment: occ   Drug use: Never    Comment: Smokes CBD occ   Sexual activity: Yes    Birth control/protection: OCP  Other Topics Concern   Not on file  Social History Narrative   Not on file   Social Drivers of Health   Financial Resource Strain: Not on file  Food Insecurity: Food Insecurity Present (10/21/2023)   Hunger Vital Sign    Worried About Running Out of  Food in the Last Year: Often true    Ran Out of Food in the Last Year: Often true  Transportation Needs: Unmet Transportation Needs (10/21/2023)   PRAPARE - Administrator, Civil Service (Medical): Yes    Lack of Transportation (Non-Medical): Yes  Physical Activity: Not on file  Stress: No Stress Concern Present (07/04/2021)   [employer] of Occupational Health - Occupational Stress Questionnaire    Feeling of Stress : Only a little   Recent Concern: Stress - Stress Concern Present (06/20/2021)   [employer] of Occupational Health - Occupational Stress Questionnaire    Feeling of Stress : To some extent  Social Connections: Unknown (03/31/2023)   [facility]   Social Network    Social Network: Not on file  Intimate Partner Violence: Unknown (03/31/2023)   Received from [facility]   HITS    Physically Hurt: Not on file    Insult or Talk Down To: Not on file    Threaten Physical Harm: Not on file    Scream or Curse: Not on file    Review of Systems:    Constitutional: No weight loss, fever or chills Cardiovascular: No chest pain  Respiratory: No SOB  Gastrointestinal: See HPI and otherwise negative   Physical Exam:  Vital signs: BP 118/80   Pulse 100   Ht 5\' 8"  (1.727 m)   Wt 271 lb (122.9 kg)   BMI 41.21 kg/m    Constitutional:   Pleasant obese AA female appears to be in NAD, Well developed, Well nourished, alert and cooperative Respiratory: Respirations even and unlabored. Lungs clear to auscultation bilaterally.   No wheezes, crackles, or rhonchi.  Cardiovascular: Normal S1, S2. No MRG. Regular rate and rhythm. No peripheral edema, cyanosis or pallor.  Gastrointestinal:  Soft, nondistended, moderate TTP in bilateral lower quadrants. No rebound or guarding. Normal bowel sounds. No appreciable masses or hepatomegaly. Rectal:  Not performed.  Psychiatric: Oriented to person, place and time. Demonstrates good judgement and reason without abnormal affect or behaviors.  RELEVANT LABS AND IMAGING: CBC    Component Value Date/Time   WBC 8.3 09/08/2023 0830   RBC 4.18 09/08/2023 0830   HGB 11.2 (L) 09/08/2023 0830   HGB 11.1 08/24/2023 1434   HCT 35.6 (L) 09/08/2023 0830   HCT 35.5 08/24/2023 1434   PLT 224 09/08/2023 0830   PLT 276 08/24/2023 1434   MCV 85.2 09/08/2023 0830   MCV 84 08/24/2023 1434   MCH 26.8 09/08/2023 0830   MCHC 31.5 09/08/2023 0830   RDW 13.2 09/08/2023  0830   RDW 13.8 08/24/2023 1434   LYMPHSABS 2.8 09/08/2023 0830   MONOABS 0.6 09/08/2023 0830   EOSABS 0.1 09/08/2023 0830   BASOSABS 0.0 09/08/2023 0830    CMP     Component Value Date/Time   NA 137 09/08/2023 0830   NA 138 08/28/2020 1600   K 3.6 09/08/2023 0830   CL 104 09/08/2023 0830   CO2 26 09/08/2023 0830   GLUCOSE 101 (H) 09/08/2023 0830   BUN 7 09/08/2023 0830   BUN 6 08/28/2020 1600   CREATININE 0.78 09/08/2023 0830   CALCIUM 9.0 09/08/2023 0830   PROT 7.2 09/08/2023 0830   PROT 7.6 08/28/2020 1600   ALBUMIN 3.8 09/08/2023 0830   ALBUMIN 4.3 08/28/2020 1600   AST 10 (L) 09/08/2023 0830   ALT 11 09/08/2023 0830   ALKPHOS 70 09/08/2023 0830   BILITOT 0.2 09/08/2023 0830   BILITOT 0.2  08/28/2020 1600   GFRNONAA >60 09/08/2023 0830   GFRAA 147 08/28/2020 1600    Assessment: 1.  Chronic abdominal and pelvic pain: Continues per patient, has had an extensive negative workup including multiple CTs, EGD/colonoscopy, diagnostic laparoscopic with OB/GYN and no help from multiple medications including Hyoscyamine , narcotics, anti-inflammatories and others, recently started on Cymbalta  30 mg which has not specifically helped lately, IBgard with a slight response; VS/chronic pain syndrome 2.  GERD with nausea: Nicely controlled on Pantoprazole  40 twice daily and Famotidine  at bedtime  Plan: 1.  Discussed with patient that the ultimate goal be able to get her to a point where most days she is feeling comfortable enough to at least work. 2.  For now continue working with psychiatrist in regards to titration of Cymbalta  3.  Recommend buying over-the-counter IBgard and taking 2 tabs up to 3 times a day 4.  Continue Pantoprazole  and Famotidine  5.  Could retrial abdominal wall injections as she did see some benefit from this initially 6.  Patient to follow in clinic with Dr. Cherryl Corona only in the next 3 to 4 months to make sure he does not have any additional  recommendations.  Reginal Capra, PA-C  Gastroenterology 11/15/2023, 9:31 AM  Cc: Tann, Samandra, NP

## 2023-11-15 NOTE — Patient Instructions (Addendum)
 We have given you samples of the following medication to take: IBgard 2 capsules three times daily.   Continue to follow up with psychiatrist.  Follow up with Dr. Cherryl Corona on 02/01/24 @ 3:20 pm.  _______________________________________________________  If your blood pressure at your visit was 140/90 or greater, please contact your primary care physician to follow up on this.  _______________________________________________________  If you are age 23 or older, your body mass index should be between 23-30. Your Body mass index is 41.21 kg/m. If this is out of the aforementioned range listed, please consider follow up with your Primary Care Provider.  If you are age 32 or younger, your body mass index should be between 19-25. Your Body mass index is 41.21 kg/m. If this is out of the aformentioned range listed, please consider follow up with your Primary Care Provider.   ________________________________________________________  The [facility] providers would like to encourage you to use MYCHART to communicate with providers for non-urgent requests or questions.  Due to long hold times on the telephone, sending your provider a message by [facility] may be a faster and more efficient way to get a response.  Please allow 48 business hours for a response.  Please remember that this is for non-urgent requests.  _______________________________________________________

## 2023-11-29 NOTE — Progress Notes (Signed)
 Agree with the assessment and plan as outlined by Reginal Capra, PA-C.  Alternative modalities such as accupuncture considered.  The [facility] App could also be considered if the patient is willing.  Agree with titrating up Cymbalta  as next intervention.  Divonte Senger E. Cherryl Corona, MD

## 2023-11-30 ENCOUNTER — Ambulatory Visit (INDEPENDENT_AMBULATORY_CARE_PROVIDER_SITE_OTHER): Admitting: Neurology

## 2023-11-30 DIAGNOSIS — Z9189 Other specified personal risk factors, not elsewhere classified: Secondary | ICD-10-CM

## 2023-11-30 DIAGNOSIS — G4733 Obstructive sleep apnea (adult) (pediatric): Secondary | ICD-10-CM

## 2023-11-30 DIAGNOSIS — G9332 Myalgic encephalomyelitis/chronic fatigue syndrome: Secondary | ICD-10-CM

## 2023-11-30 DIAGNOSIS — F5113 Hypersomnia due to other mental disorder: Secondary | ICD-10-CM

## 2023-11-30 DIAGNOSIS — G8929 Other chronic pain: Secondary | ICD-10-CM

## 2023-12-02 ENCOUNTER — Ambulatory Visit: Admitting: Obstetrics and Gynecology

## 2023-12-02 NOTE — Progress Notes (Signed)
 [facility] Jacqueline Gonzalez 23 year old female 16-Jul-2001   HOME SLEEP TEST REPORT ( by Watch PAT)   STUDY DATE:  11-30-2023 ORDERING CLINICIAN:  Neomia Banner, MD  REFERRING CLINICIAN: Milton Alpers, MD  PCP :NP Trudie Fuse Primary neurologist is Dr Dotty Gee, DO    CLINICAL INFORMATION/HISTORY: Jacqueline Gonzalez is a 23 y.o.right handed AA female patient who is seen upon her rheumatologists referral  on 11/04/2023  for a sleep evaluation; The expectation is to rule out an organic sleep disorder.  Ms. Prevette has had for many years (since middle school) reportedly suffered from abdominal pain, but she also developed other regions of pain including headaches, neck pain, joint pain, and chest pain.  She has been seen by Milton Alpers ,her rheumatologist.  Her headache disorder is treated by her primary neurologist Dr. Festus Hubert, DO.  Her primary care provider is at [facility], nurse practitioner Tann.   Pain is affecting her sleep, the ability to sleep through the night.  She feels it interferes with initiation of sleep, sometimes sleep is delayed up to 6 AM . She sleeps however up to 10-12 hours a day, this alternating with 5-6 hours on some nights- a cyclic insomnia/ hypersomnia, a pattern familiar to mental health professionals. She reports not feeling refreshed or restored in AM, with symptoms such as dry mouth, morning headaches- often severe but neck pain, joint pain, abdominal pain, Nausea.  Naps are taken frequently,  but these are not scheduled- and lasting from 2-3 hours after which they are interfering with nocturnal sleep.    Again, the expectation is to rule out an organic sleep disorder.      Epworth sleepiness score:15/ 24 points .FSS endorsed at 56/ 63 points.      BMI: 40.75  kg/m   Neck Circumference: 16.5"   FINDINGS:   Sleep Summary:   Total Recording Time (hours, min):   11 hours 55 minutes  Total Sleep Time (hours, min):    6 hours 1 minute  -there were many sleep interruptions.            Percent REM (%): 22.7%                                      Respiratory Indices:   Calculated pAHI (per AASM or CMS guideline): By AASM guidelines the AHI is 7.0/h.  By CMS guidelines the AHI is 3.3/h.                         REM pAHI: Following CMS criteria the AHI is 4.6/h   , and following AASM criteria 13.9/h                                             NREM pAHI:   Following CMS criteria the AHI is 3/h   , the AASM criteria based AHI is 5.8/h                      Positional AHI: By AASM criteria sleep in supine was associated with an AHI of 9.9, sleep on the right side with an AHI of 9.4/h, and prone sleep with an AHI of 3.4/h  The patient slept equal amounts in supine, prone and right lateral position the supine AHI was 4.3/h, the prone AHI 1.7/h in the right lateral AHI 4.7/h following CMS criteria.    Snoring:   Mean volume of snoring was 42 dB, snoring was present for over 50% of the total recorded sleep time.                                             Oxygen Saturation Statistics:   Oxygen Saturation (%) Mean:    96%            O2 Saturation Range (%):    Between 92 and 99%                                   O2 Saturation (minutes) <89%:      0 minutes  Pulse Rate Statistics:   Pulse Mean (bpm): 97 bpm               Pulse Range:    Between 79 and 117 bpm.  There was intermittent tachycardia noted.             IMPRESSION:  This HST confirms the presence of very mild all obstructive sleep apnea.  I can offer this patient positive airway pressure therapy but I am not optimistic that it will change her insomnia or the low sleep quality that she perceives.  There was no associated hypoxia, sleep was very very fragmented and the overall AHI was 7.0 by AASM criteria or 3.3/h by CMS criteria.  Weight loss would be very important recommendation for this patient.  Since the patient reports her insomnia is pain related treatment  of pain and the underlying condition would be the primary treatment of her insomnia.   RECOMMENDATION: I can offer this patient a CPAP therapy trial.  I hope she will find that she gets better sleep, longer sleep hopefully less fragmented sleep on the PAP therapy.  The auto titration device would be set between 5 and 20 cm water pressure with 2 cm EPR, heated humidification and an interface of patient's choice.  I will ask my staff to inform the patient that we will provide this option and will offer follow up if the patient will use PAP therapy.   I will place the DME order today and will share this with Dr. Festus Hubert and Dr. Milton Alpers.     PHYSICIAN:  Neomia Banner, MD  [facility] Sleep Board certified by [employer] of Sleep Medicine and Diplomate of the [employer] of Sleep Medicine. Board certified In Neurology through the ABPN, Fellow of the [employer] of Neurology.     [id number] neurologist is Dr Dotty Gee, DO

## 2023-12-13 ENCOUNTER — Ambulatory Visit: Payer: Self-pay | Admitting: Neurology

## 2023-12-13 DIAGNOSIS — G479 Sleep disorder, unspecified: Secondary | ICD-10-CM

## 2023-12-13 DIAGNOSIS — G9332 Myalgic encephalomyelitis/chronic fatigue syndrome: Secondary | ICD-10-CM

## 2023-12-13 DIAGNOSIS — G8929 Other chronic pain: Secondary | ICD-10-CM

## 2023-12-13 DIAGNOSIS — F5113 Hypersomnia due to other mental disorder: Secondary | ICD-10-CM

## 2023-12-13 NOTE — Procedures (Signed)
 [facility] Jacqueline Gonzalez 23 year old female 16-Jul-2001   HOME SLEEP TEST REPORT ( by Watch PAT)   STUDY DATE:  11-30-2023 ORDERING CLINICIAN:  Neomia Banner, MD  REFERRING CLINICIAN: Milton Alpers, MD  PCP :NP Trudie Fuse Primary neurologist is Dr Dotty Gee, DO    CLINICAL INFORMATION/HISTORY: Jacqueline Gonzalez is a 23 y.o.right handed AA female patient who is seen upon her rheumatologists referral  on 11/04/2023  for a sleep evaluation; The expectation is to rule out an organic sleep disorder.  Ms. Prevette has had for many years (since middle school) reportedly suffered from abdominal pain, but she also developed other regions of pain including headaches, neck pain, joint pain, and chest pain.  She has been seen by Milton Alpers ,her rheumatologist.  Her headache disorder is treated by her primary neurologist Dr. Festus Hubert, DO.  Her primary care provider is at [facility], nurse practitioner Tann.   Pain is affecting her sleep, the ability to sleep through the night.  She feels it interferes with initiation of sleep, sometimes sleep is delayed up to 6 AM . She sleeps however up to 10-12 hours a day, this alternating with 5-6 hours on some nights- a cyclic insomnia/ hypersomnia, a pattern familiar to mental health professionals. She reports not feeling refreshed or restored in AM, with symptoms such as dry mouth, morning headaches- often severe but neck pain, joint pain, abdominal pain, Nausea.  Naps are taken frequently,  but these are not scheduled- and lasting from 2-3 hours after which they are interfering with nocturnal sleep.    Again, the expectation is to rule out an organic sleep disorder.      Epworth sleepiness score:15/ 24 points .FSS endorsed at 56/ 63 points.      BMI: 40.75  kg/m   Neck Circumference: 16.5"   FINDINGS:   Sleep Summary:   Total Recording Time (hours, min):   11 hours 55 minutes  Total Sleep Time (hours, min):    6 hours 1 minute  -there were many sleep interruptions.            Percent REM (%): 22.7%                                      Respiratory Indices:   Calculated pAHI (per AASM or CMS guideline): By AASM guidelines the AHI is 7.0/h.  By CMS guidelines the AHI is 3.3/h.                         REM pAHI: Following CMS criteria the AHI is 4.6/h   , and following AASM criteria 13.9/h                                             NREM pAHI:   Following CMS criteria the AHI is 3/h   , the AASM criteria based AHI is 5.8/h                      Positional AHI: By AASM criteria sleep in supine was associated with an AHI of 9.9, sleep on the right side with an AHI of 9.4/h, and prone sleep with an AHI of 3.4/h  slept equal amounts in supine, prone and right lateral position the supine AHI was 4.3/h, the prone AHI 1.7/h in the right lateral AHI 4.7/h following CMS criteria.    Snoring:   Mean volume of snoring was 42 dB, snoring was present for over 50% of the total recorded sleep time.                                             Oxygen Saturation Statistics:   Oxygen Saturation (%) Mean:    96%            O2 Saturation Range (%):    Between 92 and 99%                                   O2 Saturation (minutes) <89%:      0 minutes  Pulse Rate Statistics:   Pulse Mean (bpm): 97 bpm               Pulse Range:    Between 79 and 117 bpm.  There was intermittent tachycardia noted.             IMPRESSION:  This HST confirms the presence of very mild all obstructive sleep apnea.  I can offer this patient positive airway pressure therapy but I am not optimistic that it will change her insomnia or the low sleep quality that she perceives.  There was no associated hypoxia, sleep was very very fragmented and the overall AHI was 7.0 by AASM criteria or 3.3/h by CMS criteria.  Weight loss would be very important recommendation for this patient.  Since the patient reports her insomnia is pain related treatment of pain  and the underlying condition would be the primary treatment of her insomnia.   RECOMMENDATION: I can offer this patient a CPAP therapy trial.  I hope she will find that she gets better sleep, longer sleep hopefully less fragmented sleep on the PAP therapy.  The auto titration device would be set between 5 and 20 cm water pressure with 2 cm EPR, heated humidification and an interface of patient's choice.  I will ask my staff to inform the patient that we will provide this option and will offer follow up if the patient will use PAP therapy.   I will place the DME order today and will share this with Dr. Festus Hubert and Dr. Milton Alpers.     PHYSICIAN:  Neomia Banner, MD  [facility] Sleep Board certified by [employer] of Sleep Medicine and Diplomate of the [employer] of Sleep Medicine. Board certified In Neurology through the ABPN, Fellow of the [employer] of Neu[employer].     ZO:XWRUEAV neurologist is Dr Dotty Gee, DO

## 2023-12-29 ENCOUNTER — Telehealth: Payer: Self-pay | Admitting: Neurology

## 2023-12-29 NOTE — Addendum Note (Signed)
 Addended by: Burns Carwin on: 12/29/2023 12:05 PM   Modules accepted: Orders

## 2023-12-29 NOTE — Telephone Encounter (Signed)
-----   Message from Mayer Dohmeier sent at 12/13/2023  4:47 PM EDT ----- I found very mild apnea, severely fragmented sleep-  I doubt its apnea causing the patient's symptoms at all, but treatment may improve them. I offer her CPAP -   I ordered an auto CPAP at manufacturer settings and nasal interface .   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

## 2023-12-29 NOTE — Telephone Encounter (Signed)
 I spoke with the patient and discussed her sleep study results.  The patient verbalized understanding and she is amenable to trying the CPAP.  She states sometimes she wakes up and either has a headache or she feels like her breathing is affected. She would like to see if the cpap helps. We discussed the insurance compliance requirements which includes using the machine at least 4 hours at night and also being seen by our office between 30 and 90 days after setup. Pt scheduled initial f/u for 03/28/24 at 330 pm arrive at 315 (pt asked for a Tuesday). She also would like a referral for cognitive behavioral therapy. She already sees [facility] for anxiety and depression.   I called [facility] and found out that they offer cognitive behavioral therapy for insomnia. Dr Dohmeier aware and has given v.o. for referral.   Referral for autopap sent to [facility]. Order placed for cognitive behavioral therapy for insomnia. Sleep study report also sent to referring provider.

## 2023-12-29 NOTE — Telephone Encounter (Signed)
 Referral for behavioral health fax to [facility]. Phone: [phone], Fax: [phone]

## 2023-12-30 NOTE — Telephone Encounter (Signed)
 Salick, Jeanne Milliner, Jodeen Munch, RN; Zott, [place]; [place], Tammy Got it

## 2024-01-04 ENCOUNTER — Other Ambulatory Visit: Payer: Self-pay | Admitting: Gastroenterology

## 2024-01-05 ENCOUNTER — Telehealth: Admitting: Physician Assistant

## 2024-01-05 DIAGNOSIS — J069 Acute upper respiratory infection, unspecified: Secondary | ICD-10-CM

## 2024-01-05 MED ORDER — FLUTICASONE PROPIONATE 50 MCG/ACT NA SUSP: 2.0000 | Freq: Every day | NASAL | 0 refills | Status: AC

## 2024-01-05 MED ORDER — BENZONATATE 100 MG PO CAPS: 100.0000 mg | ORAL_CAPSULE | Freq: Three times a day (TID) | ORAL | 0 refills | Status: AC

## 2024-01-05 MED ORDER — PREDNISONE 50 MG PO TABS: 50.0000 mg | ORAL_TABLET | Freq: Every day | ORAL | 0 refills | Status: AC

## 2024-01-05 NOTE — Progress Notes (Signed)
 Jacqueline Gonzalez, Jacqueline Gonzalez are scheduled for a virtual visit with your provider today.    Just as we do with appointments in the office, we must obtain your consent to participate.  Your consent will be active for this visit and any virtual visit you may have with one of our providers in the next 365 days.    If you have a MyChart account, I can also send a copy of this consent to you electronically.  All virtual visits are billed to your insurance company just like a traditional visit in the office.  As this is a virtual visit, video technology does not allow for your provider to perform a traditional examination.  This may limit your provider's ability to fully assess your condition.  If your provider identifies any concerns that need to be evaluated in person or the need to arrange testing such as labs, EKG, etc, we will make arrangements to do so.    Although advances in technology are sophisticated, we cannot ensure that it will always work on either your end or our end.  If the connection with a video visit is poor, we may have to switch to a telephone visit.  With either a video or telephone visit, we are not always able to ensure that we have a secure connection.   I need to obtain your verbal consent now.   Are you willing to proceed with your visit today?   Therasa Lorenzi has provided verbal consent on 01/05/2024 for a virtual visit (video or telephone).   Aminta Kales, PA-C 01/05/2024  9:53 AM   Date:  01/05/2024   ID:  Jacqueline Gonzalez, DOB [birth year], MRN [id number]  Patient Location: Home Provider Location: Home Office   Participants: Patient and Provider for Visit and Wrap up  Method of visit: Video  Location of Patient: Home Location of Provider: Home Office Consent was obtain for visit over the video. Services rendered by provider: Visit was performed via video  A video enabled telemedicine application was used and I verified that I am speaking with the correct person using two  identifiers.  PCP:  Tann, Samandra, NP   Chief Complaint:  uri sxs  History of Present Illness:    Jacqueline Gonzalez is a 23 y.o. female with history as stated below. Presents video telehealth for an acute care visit  Pt reports cough, headache, fever, loss of voice, wheezing, rhinorrhea, congestion for the last 2-3 days.   Denies sore throat.   She has had to use her albuterol  as well.   Reports multiple sick contacts with similar symptoms. Reports increased allergies as well due to being around dogs.  Past Medical, Surgical, Social History, Allergies, and Medications have been Reviewed.  Past Medical History:  Diagnosis Date   Anemia    Anxiety    Arthritis    knees   Chlamydia 11/13/2021   Constipation, chronic    Depression    Dyspnea    ON OCC   Environmental allergies    Ganglion cyst of dorsum of left wrist    RECURRENT   GERD (gastroesophageal reflux disease)    watches diet   Headache MIGRAINE    History of hypertension    IN PAST, NO RECENT HTN PER PT 01-19-2023   IBS (irritable bowel syndrome)    Lactose intolerance    PCOS (polycystic ovarian syndrome)    Pre-diabetes     Current Meds  Medication Sig   benzonatate  (TESSALON ) 100 MG capsule Take 1 capsule (  100 mg total) by mouth every 8 (eight) hours for 5 days.   fluticasone  (FLONASE ) 50 MCG/ACT nasal spray Place 2 sprays into both nostrils daily.   predniSONE  (DELTASONE ) 50 MG tablet Take 1 tablet (50 mg total) by mouth daily for 5 days.     Allergies:   Blue dyes (parenteral), Blueberry [vaccinium angustifolium], Raspberry, Shellfish allergy, Amitriptyline , and Sumatriptan    ROS See HPI for history of present illness.  Physical Exam Pulmonary:     Comments: Speaking in full sentences  Neurological:     Mental Status: She is alert.              MDM:  PT with URI sxs x2-3 days with multiple sick contacts at home. Likely viral syndrome and exacerbation of asthma. Rx for tessalon , fluticasone ,  prednisone  given.   Tests Ordered: No orders of the defined types were placed in this encounter.   Medication Changes: Meds ordered this encounter  Medications   benzonatate  (TESSALON ) 100 MG capsule    Sig: Take 1 capsule (100 mg total) by mouth every 8 (eight) hours for 5 days.    Dispense:  15 capsule    Refill:  0   predniSONE  (DELTASONE ) 50 MG tablet    Sig: Take 1 tablet (50 mg total) by mouth daily for 5 days.    Dispense:  5 tablet    Refill:  0   fluticasone  (FLONASE ) 50 MCG/ACT nasal spray    Sig: Place 2 sprays into both nostrils daily.    Dispense:  16 g    Refill:  0     Disposition:  Follow up  Signed, Aminta Kales, PA-C  01/05/2024 9:53 AM

## 2024-01-05 NOTE — Patient Instructions (Signed)
 Princess Brooks, thank you for joining Aminta Kales, PA-C for today's virtual visit.  While this provider is not your primary care provider (PCP), if your PCP is located in our provider database this encounter information will be shared with them immediately following your visit.   A [facility] MyChart account gives you access to today's visit and all your visits, tests, and labs performed at [facility]  click here if you don't have a [facility] MyChart account or go to [email or web address]  Consent: (Patient) Natashia Roseman provided verbal consent for this virtual visit at the beginning of the encounter.  Current Medications:  Current Outpatient Medications:    ACCRUFER 30 MG CAPS, TAKE 1 CAPSULE BY MOUTH TWICE A DAY BEFORE MEALS FOR ANEMIA., Disp: , Rfl:    ADVAIR DISKUS 100-50 MCG/ACT AEPB, Inhale 1 puff into the lungs 2 (two) times daily. (Patient not taking: Reported on 11/15/2023), Disp: , Rfl:    albuterol  (VENTOLIN  HFA) 108 (90 Base) MCG/ACT inhaler, Inhale into the lungs every 6 (six) hours as needed for wheezing or shortness of breath., Disp: , Rfl:    buPROPion (WELLBUTRIN XL) 150 MG 24 hr tablet, Take by mouth., Disp: , Rfl:    cetirizine  (ZYRTEC ) 10 MG tablet, Take 1 tablet (10 mg total) by mouth daily., Disp: 30 tablet, Rfl: 0   clindamycin  (CLEOCIN ) 300 MG capsule, Take 1 capsule (300 mg total) by mouth 3 (three) times daily., Disp: 30 capsule, Rfl: 0   cyclobenzaprine  (FLEXERIL ) 10 MG tablet, Take 1 tablet (10 mg total) by mouth at bedtime., Disp: 30 tablet, Rfl: 2   Drospirenone  (SLYND ) 4 MG TABS, Take 1 tablet (4 mg total) by mouth daily., Disp: 90 tablet, Rfl: 3   DTx App - Gastrointestinal [facility] IBS) MISC, Use as directed, Disp: 11 each, Rfl: 1   DULoxetine  (CYMBALTA ) 30 MG capsule, Take 30 mg by mouth every morning., Disp: , Rfl:    EMGALITY 120 MG/ML SOAJ, Inject into the skin., Disp: , Rfl:    famotidine  (PEPCID ) 40 MG tablet, TAKE 1 TABLET  BY MOUTH EVERYDAY AT BEDTIME, Disp: 90 tablet, Rfl: 3   fluconazole  (DIFLUCAN ) 150 MG tablet, Take 1 tablet (150 mg total) by mouth daily., Disp: 1 tablet, Rfl: 0   FLUoxetine  (PROZAC ) 20 MG capsule, Take 60 mg by mouth every morning., Disp: , Rfl:    FLUoxetine  (PROZAC ) 40 MG capsule, Take 1 capsule (40 mg total) by mouth daily. (Patient not taking: Reported on 11/15/2023), Disp: 90 capsule, Rfl: 3   fluticasone  (FLONASE ) 50 MCG/ACT nasal spray, Place 2 sprays into both nostrils daily., Disp: 16 g, Rfl: 0   gabapentin  (NEURONTIN ) 100 MG capsule, Take 1 capsule (100 mg total) by mouth 3 (three) times daily., Disp: 30 capsule, Rfl: 0   gabapentin  (NEURONTIN ) 300 MG capsule, Take 300 mg by mouth as needed., Disp: , Rfl:    Galcanezumab-gnlm (EMGALITY [place]), Inject 1 Dose into the skin every 30 (thirty) days. (Patient not taking: Reported on 11/15/2023), Disp: , Rfl:    hydroxychloroquine  (PLAQUENIL ) 200 MG tablet, Take 2 tablets (400 mg total) by mouth daily., Disp: 180 tablet, Rfl: 0   hydrOXYzine  (VISTARIL ) 25 MG capsule, Take 25 mg by mouth 2 (two) times daily as needed., Disp: , Rfl:    ibuprofen  (ADVIL ) 600 MG tablet, Take 1 tablet (600 mg total) by mouth every 6 (six) hours as needed., Disp: 30 tablet, Rfl: 0   lidocaine  (XYLOCAINE ) 5 % ointment, Apply 1 Application topically as  needed., Disp: 35 g, Rfl: 0   medroxyPROGESTERone  (PROVERA ) 10 MG tablet, Take 1 tablet (10 mg total) by mouth daily. Use for ten days, Disp: 10 tablet, Rfl: 0   omeprazole  (PRILOSEC) 20 MG capsule, Take 1 capsule (20 mg total) by mouth daily., Disp: 30 capsule, Rfl: 0   ondansetron  (ZOFRAN ) 4 MG tablet, Take 4 mg by mouth 2 (two) times daily as needed., Disp: , Rfl:    oxyCODONE -acetaminophen  (PERCOCET/ROXICET) 5-325 MG tablet, Take 1 tablet by mouth every 6 (six) hours as needed., Disp: 8 tablet, Rfl: 0   pantoprazole  (PROTONIX ) 40 MG tablet, Take 1 tablet (40 mg total) by mouth 2 (two) times daily before a meal., Disp: 60  tablet, Rfl: 6   pregabalin (LYRICA) 50 MG capsule, Take 50 mg by mouth 2 (two) times daily. (Patient not taking: Reported on 11/15/2023), Disp: , Rfl:    propranolol (INDERAL) 60 MG tablet, Take 60 mg by mouth 2 (two) times daily., Disp: , Rfl:    Psyllium (METAMUCIL) 48.57 % POWD, 1 spoonful daily for the next 14 days, Disp: , Rfl:    Vitamin D , Ergocalciferol , (DRISDOL) 1.25 MG (50000 UNIT) CAPS capsule, Take 50,000 Units by mouth every 7 (seven) days., Disp: , Rfl:    VRAYLAR 1.5 MG capsule, Take 1.5 mg by mouth daily., Disp: , Rfl:    Medications ordered in this encounter:  No orders of the defined types were placed in this encounter.    *If you need refills on other medications prior to your next appointment, please contact your pharmacy*  Follow-Up: Call back or seek an in-person evaluation if the symptoms worsen or if the condition fails to improve as anticipated.  [facility] [phone]  Other Instructions Please take the medications as prescribed   Follow up with your regular doctor in 1 week for reassessment and seek care sooner if your symptoms worsen or fail to improve.   If you have been instructed to have an in-person evaluation today at a local Urgent Care facility, please use the link below. It will take you to a list of all of our available [facility], including address, phone number and hours of operation. Please do not delay care.  [facility]  If you or a family member do not have a primary care provider, use the link below to schedule a visit and establish care. When you choose a [facility] primary care physician or advanced practice provider, you gain a long-term partner in health. Find a Primary Care Provider  Learn more about [facility] in-office and virtual care options: [facility] - Get Care Now

## 2024-01-06 ENCOUNTER — Telehealth: Payer: Self-pay | Admitting: Internal Medicine

## 2024-01-06 NOTE — Telephone Encounter (Signed)
 Patient called requesting her 11AM appointment 01/07/24 to be a virtual appt. Pt is out of town.

## 2024-01-07 ENCOUNTER — Ambulatory Visit: Admitting: Internal Medicine

## 2024-01-07 NOTE — Telephone Encounter (Signed)
 I recommend rescheduling for an in person follow up at a later date. She does not have any medications needing monitoring at f/u right now.

## 2024-01-07 NOTE — Progress Notes (Deleted)
 Office Visit Note  Patient: Jacqueline Gonzalez             Date of Birth: [birth year]           MRN: [id number]             PCP: Forestine Igo, NP Referring: Forestine Igo, NP Visit Date: 01/07/2024   Subjective:  No chief complaint on file.   History of Present Illness: Jacqueline Gonzalez is a 23 y.o. female here for follow up ***   Previous HPI 10/07/23 Jacqueline Gonzalez is a 23 y.o. female here for follow up for continued joint pain at multiple areas on HCQ 400 mg daily.   She experiences persistent abdominal pain throughout the day and night. A CT scan of the abdomen on January 19th did not reveal any abnormalities, and a previous endoscopy showed no ulcers or inflammatory lesions. She was initially recommended pantoprazole  for stomach acid suppression due to concerns about gastritis but has since switched to Mytesi and omeprazole  without significant improvement. She has not had an ultrasound to evaluate the liver or gallbladder.   She experiences nausea and diarrhea, with blood in her stool approximately every two weeks. She describes being usually constipated, with difficulty in bowel movements. She was evaluated by a gastroenterologist less than a month ago, who suggested her symptoms might be related to IBS. She was recently prescribed Cymbalta  by her psychiatrist, which she started a couple of weeks ago.   She reports joint pain and stiffness, particularly in the mornings, with numbness in her hands and tingling in her feet. She has been diagnosed with carpal tunnel syndrome in one hand. She experiences significant fatigue and difficulty performing daily activities, such as washing dishes and driving. She reports poor sleep, getting only about four hours per night, and sometimes sleeping excessively during the day. She has not had a sleep study or seen a neurologist for these issues.   She mentions a history of rashes, which have been minor recently. She uses a cream, possibly  triamcinolone, for her skin. She also takes gabapentin  but has not used muscle relaxers like Flexeril . She recalls a possible flu-like illness since her last visit but no major infections. She has not had any recent exposure to ticks or been tested for Lyme disease.    Previous HPI 07/09/2023 Jacqueline Gonzalez is a 23 y.o. female here for follow up for continued joint pain at multiple areas currently having trouble especially in her wrist and ankles.  She reports no significant improvement despite the initiation of Plaquenil  200 mg daily. They describe a new onset of skin issues, characterized by abnormal healing of scars and easy bruising, particularly after blood draws. The patient also reports constant pain, which is severe enough to consider daily hospital visits, but denies any specific aggravating factors. Over-the-counter pain medications, such as ibuprofen  and Tylenol , are currently being used for pain management.   The patient was recently seen by a gynecologist, who recommended a gastroenterology consult, scheduled for February. They also report a recent bout of bronchitis, which was treated with prednisone  and antibiotics. During this time, they also had a tooth infection, which was managed by extraction. The patient did not notice any significant changes while on the short course of steroids.   The patient also reports occasional blood in their stool, which varies from bright red to black. The patient also has a history of eczema, which has been resistant to previous treatments. They deny any circulation problems in the colder  weather.      Previous HPI 04/12/2023 Jacqueline Gonzalez is a 23 y.o. female here for follow up for continued joint pain at multiple areas currently having trouble especially in her wrist and ankles.  So far does not feel hydroxychloroquine  has changed her symptoms dramatically. Also still has ongoing lower abdominal and pelvic pain chronically. She had laparoscopy for  this with an adhesion found but not thought clinically important. Still also has persistent diarrhea and constipation.  Has been getting some itchy rash and describes redness along the inner sides and underneath her breasts was recommended to use her topical eczema cream but not seeing any benefit so far.   Previous HPI 02/09/2023 Jacqueline Gonzalez is a 23 y.o. female here for follow up for continued joint pain at multiple areas currently having trouble especially in her wrist and ankles.  Workup at our initial visit showed a high sedimentation rate high complement C3 and elevated RNP antibody titer so some concern for underlying inflammatory arthritis process.  She is noticing more symptoms around the ganglion cyst of the left wrist and pain especially with active movement.  Ankle pain and soreness especially with use but not noticing any specific nodules just generalized swelling.   Previous HPI 01/08/23 Jacqueline Gonzalez is a 24 y.o. female here for evaluation of positive ANA associated with joint pains and elevated sedimentation rate.  She reports longstanding history of joint pain in multiple areas going all the way back to adolescence.  However her current problems with persistent and sometimes activity limiting widespread pain has been more prominent in the past 1 year.  This is also associated with worsening chronic fatigue.  She did not recall any preceding serious infection.  She mostly describes this as being all over the but sometimes in particular has had more back and chronic pelvic pain.  Has had low back pain and radicular symptoms extending throughout the left leg up to include part of the perineum.  Also reports previous diagnosis with knee osteoarthritis.  Pain is varying in severity worse on some days than others not associated with much visible swelling or discoloration in affected areas.  Also sensitive to pressure or temperature changes in affected areas.  Previous treatments tried  include gabapentin , oxycodone , and high-dose ibuprofen  but did not find any of them highly effective. Lab testing looking into these increase symptoms in February showing positive ANA, sed rate of 40, and deficient vitamin D  at 4.6.  She was also started on 50,000 units weekly supplementation for vitamin D  but has not felt a noticeable difference. She has history of eczema has been treated with what sounds like topical emollients in dermatology office but does not currently follow-up.  Describes her skin breaking out and irritation if having prolonged sun exposure.  No specific lesions persisting mostly just the irritation.  She reports several episodes with cystic lesions in the skin under her armpits under the breasts and in the groin.  Sometimes with drainage.  No previous diagnosis of hidradenitis suppurativa. She intermittently gets thrush possibly associated with her inhaler treatment but she does not use this consistently.  Does not get frequent oral nasal ulcers.  She denies typical Raynaud's symptoms, lymphadenopathy, or history of blood clots. Is also had more trouble with migraine headaches during this year was started on injection treatments for the past 3 months.  Also has persistent irritable bowel symptoms mixed constipation and diarrhea on Bentyl  with some benefit.  She experiences some kind of dizziness or vertiginous symptoms  these come and go without a specific position or provocation.  Says sometimes last up to 1 hour at a time.   Family history includes an aunt with Graves' disease but no first-degree relative known autoimmune disease.   Labs reviewed 11/2022 TSH wnl   08/2022 ANA pos ESR 40 Vit D 4.6 RF neg CCP neg   No Rheumatology ROS completed.   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 11/04/2023   Chronic pain syndrome 11/04/2023   Insomnia secondary  to chronic pain 11/04/2023   CFS (chronic fatigue syndrome) 11/04/2023   Fibromyalgia syndrome 10/07/2023   Intertrigo 04/12/2023   Seronegative inflammatory arthritis 02/09/2023   Precordial pain 01/28/2023   Positive ANA (antinuclear antibody) 01/08/2023   IBS (irritable bowel syndrome) 01/08/2023   Sedimentation rate elevation 01/08/2023   Polyarthralgia 01/08/2023   Vitamin D  deficiency 01/08/2023   PCOS (polycystic ovarian syndrome) 08/26/2022   LGSIL of cervix of undetermined significance 08/26/2022   History of PID 08/18/2022   Rectal abscess supralevator s/p I&D 03/14/2022 03/14/2022   Rectal pain    UTI (urinary tract infection) 03/10/2022   Rectal bleeding    Anal fissure    Abnormal CT scan, colon    Proctocolitis 03/09/2022   Menorrhagia with irregular cycle 11/18/2021   Dysmenorrhea 11/18/2021   Chronic anemia 11/17/2021   Lower abdominal pain 11/07/2021   Moderate episode of recurrent major depressive disorder (HCC) 06/23/2021   Low back pain 05/02/2021   Headache 09/03/2020   Nonspecific abdominal pain 08/30/2020   Ganglion cyst of dorsum of left wrist 11/03/2019   Constipation, chronic 01/21/2018   Irregular periods 01/21/2018   Generalized anxiety disorder 01/21/2018    Past Medical History:  Diagnosis Date   Anemia    Anxiety    Arthritis    knees   Chlamydia 11/13/2021   Constipation, chronic    Depression    Dyspnea    ON OCC   Environmental allergies    Ganglion cyst of dorsum of left wrist    RECURRENT   GERD (gastroesophageal reflux disease)    watches diet   Headache MIGRAINE    History of hypertension    IN PAST, NO RECENT HTN PER PT 01-19-2023   IBS (irritable bowel syndrome)    Lactose intolerance    PCOS (polycystic ovarian syndrome)    Pre-diabetes     Family History  Problem Relation Age of Onset   Heart failure Mother    Migraines Mother    Irritable bowel syndrome Mother    Other Mother 58       Tricuspid valve regurgitation    Asthma Father    Ovarian cancer Maternal Aunt    Graves' disease Paternal Aunt    Hypertension Maternal Grandmother    Diabetes Maternal Grandmother    Hypertension Maternal Grandfather    Diabetes Maternal Grandfather    Hypertension Paternal Grandmother    Stroke Paternal Grandmother    Diabetes Paternal Grandmother    Hypertension Paternal Grandfather    Diabetes Paternal Grandfather    Kidney disease Paternal Grandfather    Stomach cancer Neg Hx    Esophageal cancer Neg Hx    Colon cancer Neg Hx    Past Surgical History:  Procedure Laterality Date   GANGLION CYST EXCISION Left 04/24/2021   Procedure: left recurrent dorsal carpal ganglion excision;  Surgeon: Ltanya Rummer, MD;  Location: [facility];  Service: Orthopedics;  Laterality: Left;   INCISION AND DRAINAGE PERIRECTAL ABSCESS N/A 03/14/2022   Procedure: IRRIGATION AND DEBRIDEMENT PERIRECTAL ABSCESS;  Surgeon: Candyce Champagne, MD;  Location: [facility];  Service: General;  Laterality: N/A;   LAPAROSCOPY N/A 02/10/2023   Procedure: LAPAROSCOPY DIAGNOSTIC;  Surgeon: Raynell Caller, MD;  Location: [facility];  Service: Gynecology;  Laterality: N/A;   TONSILLECTOMY AND ADENOIDECTOMY     AGE 68   WISDOM TOOTH EXTRACTION  03/18/2021   WRIST GANGLION EXCISION Left 02/2020   Social History   Social History Narrative   Not on file   Immunization History  Administered Date(s) Administered   HPV 9-valent 02/19/2012, 12/19/2012, 05/01/2013   Influenza,inj,Quad PF,6+ Mos 06/15/2018   Meningococcal Mcv4o 11/21/2018   Moderna Sars-Covid-2 Vaccination 11/22/2019, 12/20/2019     Objective: Vital Signs: There were no vitals taken for this visit.   Physical Exam   Musculoskeletal Exam: ***  CDAI Exam: CDAI Score: -- Patient Global: --; Provider Global: -- Swollen: --; Tender: -- Joint Exam 01/07/2024   No joint exam has been documented for this visit   There is currently no information  documented on the homunculus. Go to the Rheumatology activity and complete the homunculus joint exam.  Investigation: No additional findings.  Imaging: No results found.  Recent Labs: Lab Results  Component Value Date   WBC 8.3 09/08/2023   HGB 11.2 (L) 09/08/2023   PLT 224 09/08/2023   NA 137 09/08/2023   K 3.6 09/08/2023   CL 104 09/08/2023   CO2 26 09/08/2023   GLUCOSE 101 (H) 09/08/2023   BUN 7 09/08/2023   CREATININE 0.78 09/08/2023   BILITOT 0.2 09/08/2023   ALKPHOS 70 09/08/2023   AST 10 (L) 09/08/2023   ALT 11 09/08/2023   PROT 7.2 09/08/2023   ALBUMIN 3.8 09/08/2023   CALCIUM 9.0 09/08/2023   GFRAA 147 08/28/2020    Speciality Comments: No specialty comments available.  Procedures:  No procedures performed Allergies: Blue dyes (parenteral), Blueberry [vaccinium angustifolium], Raspberry, Shellfish allergy, Amitriptyline , and Sumatriptan    Assessment / Plan:     Visit Diagnoses: No diagnosis found.  ***  Orders: No orders of the defined types were placed in this encounter.  No orders of the defined types were placed in this encounter.    Follow-Up Instructions: No follow-ups on file.   Matt Song, MD  Note - This record has been created using [employer].  Chart creation errors have been sought, but may not always  have been located. Such creation errors do not reflect on  the standard of medical care.

## 2024-01-10 ENCOUNTER — Emergency Department (HOSPITAL_BASED_OUTPATIENT_CLINIC_OR_DEPARTMENT_OTHER)
Admission: EM | Admit: 2024-01-10 | Discharge: 2024-01-11 | Disposition: A | Discharge status: Home / Self Care | Attending: Emergency Medicine | Admitting: Emergency Medicine

## 2024-01-10 ENCOUNTER — Encounter (HOSPITAL_BASED_OUTPATIENT_CLINIC_OR_DEPARTMENT_OTHER): Payer: Self-pay | Admitting: Emergency Medicine

## 2024-01-10 ENCOUNTER — Emergency Department (HOSPITAL_BASED_OUTPATIENT_CLINIC_OR_DEPARTMENT_OTHER): Admitting: Radiology

## 2024-01-10 ENCOUNTER — Other Ambulatory Visit: Payer: Self-pay

## 2024-01-10 DIAGNOSIS — R509 Fever, unspecified: Secondary | ICD-10-CM | POA: Diagnosis not present

## 2024-01-10 DIAGNOSIS — R059 Cough, unspecified: Secondary | ICD-10-CM | POA: Diagnosis present

## 2024-01-10 DIAGNOSIS — R051 Acute cough: Secondary | ICD-10-CM | POA: Insufficient documentation

## 2024-01-10 NOTE — ED Triage Notes (Signed)
 Duana, SOB COUGHING BROWN STUFF  Last Sunday Seen by tele visit.  On abt, nose spray and cough syrup

## 2024-01-11 MED ORDER — AEROCHAMBER PLUS FLO-VU MISC
1.0000 | Freq: Once | Status: AC
  Administered 2024-01-11: 1
  Filled 2024-01-11: qty 1

## 2024-01-11 NOTE — ED Notes (Signed)
 Patient provided with spacer as ordered per EDP.  Indications of use discussed with patient. Correct spacer usage demonstrated with patient. Patient able to perform independently.

## 2024-01-11 NOTE — ED Provider Notes (Signed)
 [place] EMERGENCY DEPARTMENT AT [facility]  Provider Note  CSN: [id number] Arrival date & time: 01/10/24 2227  History Chief Complaint  Patient presents with   URI    Jacqueline Gonzalez is a 23 y.o. female here for evaluation of persistent cough, low grade fever. Reports symptoms started about a week ago. Had a virtual visit 2 days later, given prednisone , tessalon  without improvement. Went to ED on 6/20 and given Rx for augmentin . Continues to cough up green and brown sputum. Denies pain or SOB.    Home Medications Prior to Admission medications   Medication Sig Start Date End Date Taking? Authorizing Provider  ACCRUFER 30 MG CAPS TAKE 1 CAPSULE BY MOUTH TWICE A DAY BEFORE MEALS FOR ANEMIA.    [provider]  ADVAIR DISKUS 100-50 MCG/ACT AEPB Inhale 1 puff into the lungs 2 (two) times daily. Patient not taking: Reported on 11/15/2023 11/21/22   [provider]  albuterol  (VENTOLIN  HFA) 108 (90 Base) MCG/ACT inhaler Inhale into the lungs every 6 (six) hours as needed for wheezing or shortness of breath.    [provider]  buPROPion (WELLBUTRIN XL) 150 MG 24 hr tablet Take by mouth. 03/12/23   [provider]  cetirizine  (ZYRTEC ) 10 MG tablet Take 1 tablet (10 mg total) by mouth daily. 10/18/22   Redwine, Madison A, PA-C  clindamycin  (CLEOCIN ) 300 MG capsule Take 1 capsule (300 mg total) by mouth 3 (three) times daily. 06/28/23   Vicky Charleston, PA-C  cyclobenzaprine  (FLEXERIL ) 10 MG tablet Take 1 tablet (10 mg total) by mouth at bedtime. 10/07/23   Jeannetta Lonni ORN, MD  Drospirenone  (SLYND ) 4 MG TABS Take 1 tablet (4 mg total) by mouth daily. 08/24/23   Erik Kieth BROCKS, MD  DTx App - Gastrointestinal [facility] IBS) MISC Use as directed 11/25/22   Stacia Glendia BRAVO, MD  DULoxetine  (CYMBALTA ) 30 MG capsule Take 30 mg by mouth every morning. 09/29/23   [provider]  EMGALITY 120 MG/ML SOAJ Inject into the skin. 01/02/23   [provider]  famotidine  (PEPCID ) 40 MG tablet TAKE 1 TABLET BY MOUTH EVERYDAY AT BEDTIME 01/04/24   McMichael, Bayley M, PA-C  fluconazole  (DIFLUCAN ) 150 MG tablet Take 1 tablet (150 mg total) by mouth daily. 06/28/23   Vicky Charleston, PA-C  FLUoxetine  (PROZAC ) 20 MG capsule Take 60 mg by mouth every morning. 04/10/23   [provider]  FLUoxetine  (PROZAC ) 40 MG capsule Take 1 capsule (40 mg total) by mouth daily. Patient not taking: Reported on 11/15/2023 12/31/21   Paige, Victoria J, DO  fluticasone  (FLONASE ) 50 MCG/ACT nasal spray Place 2 sprays into both nostrils daily. 06/27/22   Fleming, Zelda W, NP  fluticasone  (FLONASE ) 50 MCG/ACT nasal spray Place 2 sprays into both nostrils daily. 01/05/24   Couture, Cortni S, PA-C  gabapentin  (NEURONTIN ) 100 MG capsule Take 1 capsule (100 mg total) by mouth 3 (three) times daily. 02/17/23   Fredirick Glenys RAMAN, MD  gabapentin  (NEURONTIN ) 300 MG capsule Take 300 mg by mouth as needed. 06/23/23   [provider]  Galcanezumab-gnlm (EMGALITY [place]) Inject 1 Dose into the skin every 30 (thirty) days. Patient not taking: Reported on 11/15/2023    [provider]  hydroxychloroquine  (PLAQUENIL ) 200 MG tablet Take 2 tablets (400 mg total) by mouth daily. 10/07/23   Jeannetta Lonni ORN, MD  hydrOXYzine  (VISTARIL ) 25 MG capsule Take 25 mg by mouth 2 (two) times daily as needed. 08/16/23   [provider]  ibuprofen  (ADVIL ) 600 MG tablet Take 1 tablet (600 mg total) by mouth every 6 (six) hours as needed. 02/10/23   Izell Harari, MD  lidocaine  (XYLOCAINE ) 5 % ointment Apply 1 Application topically as needed. 08/24/23   Erik Kieth BROCKS, MD  medroxyPROGESTERone  (PROVERA ) 10 MG tablet Take 1 tablet (10 mg total) by mouth daily. Use for ten days 10/21/23   Ajewole, Christana, MD  omeprazole  (PRILOSEC) 20 MG capsule Take 1 capsule (20 mg total) by mouth daily. 09/08/23 10/08/23  Mannie Pac T, DO  ondansetron  (ZOFRAN ) 4 MG tablet Take 4 mg by  mouth 2 (two) times daily as needed. 05/27/23   [provider]  oxyCODONE -acetaminophen  (PERCOCET/ROXICET) 5-325 MG tablet Take 1 tablet by mouth every 6 (six) hours as needed. 02/17/23   Fredirick Glenys RAMAN, MD  pantoprazole  (PROTONIX ) 40 MG tablet Take 1 tablet (40 mg total) by mouth 2 (two) times daily before a meal. 09/24/22   Lemmon, Delon Gibson, PA  pregabalin (LYRICA) 50 MG capsule Take 50 mg by mouth 2 (two) times daily. Patient not taking: Reported on 11/15/2023 03/24/23   [provider]  propranolol (INDERAL) 60 MG tablet Take 60 mg by mouth 2 (two) times daily. 03/30/23   [provider]  Psyllium (METAMUCIL) 48.57 % POWD 1 spoonful daily for the next 14 days 02/10/23   Izell Harari, MD  Vitamin D , Ergocalciferol , (DRISDOL) 1.25 MG (50000 UNIT) CAPS capsule Take 50,000 Units by mouth every 7 (seven) days.    [provider]  VRAYLAR 1.5 MG capsule Take 1.5 mg by mouth daily. 08/17/23   [provider]     Allergies    Blue dyes (parenteral), Blueberry [vaccinium angustifolium], Raspberry, Shellfish allergy, Amitriptyline , and Sumatriptan    Review of Systems   Review of Systems Please see HPI for pertinent positives and negatives  Physical Exam BP (!) 145/88 (BP Location: Right Arm)   Pulse 98   Temp 98.4 F (36.9 C) (Oral)   Resp (!) 22   SpO2 98%   Physical Exam Vitals and nursing note reviewed.  Constitutional:      Appearance: Normal appearance.  HENT:     Head: Normocephalic and atraumatic.     Nose: Nose normal.     Mouth/Throat:     Mouth: Mucous membranes are moist.   Eyes:     Extraocular Movements: Extraocular movements intact.     Conjunctiva/sclera: Conjunctivae normal.    Cardiovascular:     Rate and Rhythm: Normal rate.  Pulmonary:     Effort: Pulmonary effort is normal.     Breath sounds: Wheezing and rhonchi present.  Abdominal:     General: Abdomen is flat.     Palpations: Abdomen is soft.      Tenderness: There is no abdominal tenderness.   Musculoskeletal:        General: No swelling. Normal range of motion.     Cervical back: Neck supple.   Skin:    General: Skin is warm and dry.   Neurological:     General: No focal deficit present.     Mental Status: She is alert.   Psychiatric:        Mood and Affect: Mood normal.     ED Results / Procedures / Treatments   EKG None  Procedures Procedures  Medications Ordered in the ED Medications  Aerochamber Plus device 1 each (has no administration in time range)    Initial Impression and Plan  Patient here  with URI symptoms, likely viral but already on Abx. Exam is reassuring aside from occasional wheeze/rhonchi, clears with coughing. I personally viewed the images from radiology studies and agree with radiologist interpretation: CXR is clear. Recommend she continue Abx. Given spacer to use with her usual home inhaler. PCP follow up, RTED for any other concerns.    ED Course       MDM Rules/Calculators/A&P Medical Decision Making Problems Addressed: Acute cough: acute illness or injury  Amount and/or Complexity of Data Reviewed Radiology: ordered and independent interpretation performed. Decision-making details documented in ED Course.  Risk Prescription drug management.     Final Clinical Impression(s) / ED Diagnoses Final diagnoses:  Acute cough    Rx / DC Orders ED Discharge Orders     None        Roselyn Carlin NOVAK, MD 01/11/24 [phone]

## 2024-01-27 NOTE — Progress Notes (Deleted)
 Office Visit Note  Patient: Jacqueline Gonzalez             Date of Birth: 2001-03-12           MRN: [id number]             PCP: Macdonald Ping, NP Referring: Macdonald Ping, NP Visit Date: 02/10/2024   Subjective:  No chief complaint on file.   History of Present Illness: Jacqueline Gonzalez is a 23 y.o. female here for follow up ***   Previous HPI 10/07/23 Jacqueline Gonzalez is a 23 y.o. female here for follow up for continued joint pain at multiple areas on HCQ 400 mg daily.   She experiences persistent abdominal pain throughout the day and night. A CT scan of the abdomen on January 19th did not reveal any abnormalities, and a previous endoscopy showed no ulcers or inflammatory lesions. She was initially recommended pantoprazole  for stomach acid suppression due to concerns about gastritis but has since switched to Mytesi and omeprazole  without significant improvement. She has not had an ultrasound to evaluate the liver or gallbladder.   She experiences nausea and diarrhea, with blood in her stool approximately every two weeks. She describes being usually constipated, with difficulty in bowel movements. She was evaluated by a gastroenterologist less than a month ago, who suggested her symptoms might be related to IBS. She was recently prescribed Cymbalta  by her psychiatrist, which she started a couple of weeks ago.   She reports joint pain and stiffness, particularly in the mornings, with numbness in her hands and tingling in her feet. She has been diagnosed with carpal tunnel syndrome in one hand. She experiences significant fatigue and difficulty performing daily activities, such as washing dishes and driving. She reports poor sleep, getting only about four hours per night, and sometimes sleeping excessively during the day. She has not had a sleep study or seen a neurologist for these issues.   She mentions a history of rashes, which have been minor recently. She uses a cream, possibly  triamcinolone, for her skin. She also takes gabapentin  but has not used muscle relaxers like Flexeril . She recalls a possible flu-like illness since her last visit but no major infections. She has not had any recent exposure to ticks or been tested for Lyme disease.    Previous HPI 07/09/2023 Jacqueline Gonzalez is a 23 y.o. female here for follow up for continued joint pain at multiple areas currently having trouble especially in her wrist and ankles.  She reports no significant improvement despite the initiation of Plaquenil  200 mg daily. They describe a new onset of skin issues, characterized by abnormal healing of scars and easy bruising, particularly after blood draws. The patient also reports constant pain, which is severe enough to consider daily hospital visits, but denies any specific aggravating factors. Over-the-counter pain medications, such as ibuprofen  and Tylenol , are currently being used for pain management.   The patient was recently seen by a gynecologist, who recommended a gastroenterology consult, scheduled for February. They also report a recent bout of bronchitis, which was treated with prednisone  and antibiotics. During this time, they also had a tooth infection, which was managed by extraction. The patient did not notice any significant changes while on the short course of steroids.   The patient also reports occasional blood in their stool, which varies from bright red to black. The patient also has a history of eczema, which has been resistant to previous treatments. They deny any circulation problems in the colder  weather.      Previous HPI 04/12/2023 Jacqueline Gonzalez is a 23 y.o. female here for follow up for continued joint pain at multiple areas currently having trouble especially in her wrist and ankles.  So far does not feel hydroxychloroquine  has changed her symptoms dramatically. Also still has ongoing lower abdominal and pelvic pain chronically. She had laparoscopy for  this with an adhesion found but not thought clinically important. Still also has persistent diarrhea and constipation.  Has been getting some itchy rash and describes redness along the inner sides and underneath her breasts was recommended to use her topical eczema cream but not seeing any benefit so far.   Previous HPI 02/09/2023 Jacqueline Gonzalez is a 23 y.o. female here for follow up for continued joint pain at multiple areas currently having trouble especially in her wrist and ankles.  Workup at our initial visit showed a high sedimentation rate high complement C3 and elevated RNP antibody titer so some concern for underlying inflammatory arthritis process.  She is noticing more symptoms around the ganglion cyst of the left wrist and pain especially with active movement.  Ankle pain and soreness especially with use but not noticing any specific nodules just generalized swelling.   Previous HPI 01/08/23 Jacqueline Gonzalez is a 23 y.o. female here for evaluation of positive ANA associated with joint pains and elevated sedimentation rate.  She reports longstanding history of joint pain in multiple areas going all the way back to adolescence.  However her current problems with persistent and sometimes activity limiting widespread pain has been more prominent in the past 1 year.  This is also associated with worsening chronic fatigue.  She did not recall any preceding serious infection.  She mostly describes this as being all over the but sometimes in particular has had more back and chronic pelvic pain.  Has had low back pain and radicular symptoms extending throughout the left leg up to include part of the perineum.  Also reports previous diagnosis with knee osteoarthritis.  Pain is varying in severity worse on some days than others not associated with much visible swelling or discoloration in affected areas.  Also sensitive to pressure or temperature changes in affected areas.  Previous treatments tried  include gabapentin , oxycodone , and high-dose ibuprofen  but did not find any of them highly effective. Lab testing looking into these increase symptoms in February showing positive ANA, sed rate of 40, and deficient vitamin D  at 4.6.  She was also started on 50,000 units weekly supplementation for vitamin D  but has not felt a noticeable difference. She has history of eczema has been treated with what sounds like topical emollients in dermatology office but does not currently follow-up.  Describes her skin breaking out and irritation if having prolonged sun exposure.  No specific lesions persisting mostly just the irritation.  She reports several episodes with cystic lesions in the skin under her armpits under the breasts and in the groin.  Sometimes with drainage.  No previous diagnosis of hidradenitis suppurativa. She intermittently gets thrush possibly associated with her inhaler treatment but she does not use this consistently.  Does not get frequent oral nasal ulcers.  She denies typical Raynaud's symptoms, lymphadenopathy, or history of blood clots. Is also had more trouble with migraine headaches during this year was started on injection treatments for the past 3 months.  Also has persistent irritable bowel symptoms mixed constipation and diarrhea on Bentyl  with some benefit.  She experiences some kind of dizziness or vertiginous symptoms  these come and go without a specific position or provocation.  Says sometimes last up to 1 hour at a time.   Family history includes an aunt with Graves' disease but no first-degree relative known autoimmune disease.   Labs reviewed 11/2022 TSH wnl   08/2022 ANA pos ESR 40 Vit D 4.6 RF neg CCP neg   No Rheumatology ROS completed.   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 11/04/2023   Chronic pain syndrome 11/04/2023   Insomnia secondary  to chronic pain 11/04/2023   CFS (chronic fatigue syndrome) 11/04/2023   Fibromyalgia syndrome 10/07/2023   Intertrigo 04/12/2023   Seronegative inflammatory arthritis 02/09/2023   Precordial pain 01/28/2023   Positive ANA (antinuclear antibody) 01/08/2023   IBS (irritable bowel syndrome) 01/08/2023   Sedimentation rate elevation 01/08/2023   Polyarthralgia 01/08/2023   Vitamin D  deficiency 01/08/2023   PCOS (polycystic ovarian syndrome) 08/26/2022   LGSIL of cervix of undetermined significance 08/26/2022   History of PID 08/18/2022   Rectal abscess supralevator s/p I&D 03/14/2022 03/14/2022   Rectal pain    UTI (urinary tract infection) 03/10/2022   Rectal bleeding    Anal fissure    Abnormal CT scan, colon    Proctocolitis 03/09/2022   Menorrhagia with irregular cycle 11/18/2021   Dysmenorrhea 11/18/2021   Chronic anemia 11/17/2021   Lower abdominal pain 11/07/2021   Moderate episode of recurrent major depressive disorder (HCC) 06/23/2021   Low back pain 05/02/2021   Headache 09/03/2020   Nonspecific abdominal pain 08/30/2020   Ganglion cyst of dorsum of left wrist 11/03/2019   Constipation, chronic 01/21/2018   Irregular periods 01/21/2018   Generalized anxiety disorder 01/21/2018    Past Medical History:  Diagnosis Date   Anemia    Anxiety    Arthritis    knees   Chlamydia 11/13/2021   Constipation, chronic    Depression    Dyspnea    ON OCC   Environmental allergies    Ganglion cyst of dorsum of left wrist    RECURRENT   GERD (gastroesophageal reflux disease)    watches diet   Headache MIGRAINE    History of hypertension    IN PAST, NO RECENT HTN PER PT 01-19-2023   IBS (irritable bowel syndrome)    Lactose intolerance    PCOS (polycystic ovarian syndrome)    Pre-diabetes     Family History  Problem Relation Age of Onset   Heart failure Mother    Migraines Mother    Irritable bowel syndrome Mother    Other Mother 40       Tricuspid valve regurgitation    Asthma Father    Ovarian cancer Maternal Aunt    Graves' disease Paternal Aunt    Hypertension Maternal Grandmother    Diabetes Maternal Grandmother    Hypertension Maternal Grandfather    Diabetes Maternal Grandfather    Hypertension Paternal Grandmother    Stroke Paternal Grandmother    Diabetes Paternal Grandmother    Hypertension Paternal Grandfather    Diabetes Paternal Grandfather    Kidney disease Paternal Grandfather    Stomach cancer Neg Hx    Esophageal cancer Neg Hx    Colon cancer Neg Hx    Past Surgical History:  Procedure Laterality Date   GANGLION CYST EXCISION Left 04/24/2021   Procedure: left recurrent dorsal carpal ganglion excision;  Surgeon: Alyse Agent, MD;  Location: [facility];  Service: Orthopedics;  Laterality: Left;   INCISION AND DRAINAGE PERIRECTAL ABSCESS N/A 03/14/2022   Procedure: IRRIGATION AND DEBRIDEMENT PERIRECTAL ABSCESS;  Surgeon: Sheldon Standing, MD;  Location: [facility];  Service: General;  Laterality: N/A;   LAPAROSCOPY N/A 02/10/2023   Procedure: LAPAROSCOPY DIAGNOSTIC;  Surgeon: Izell Harari, MD;  Location: [facility] [place];  Service: Gynecology;  Laterality: N/A;   TONSILLECTOMY AND ADENOIDECTOMY     AGE 39   WISDOM TOOTH EXTRACTION  03/18/2021   WRIST GANGLION EXCISION Left 02/2020   Social History   Social History Narrative   Not on file   Immunization History  Administered Date(s) Administered   HPV 9-valent 02/19/2012, 12/19/2012, 05/01/2013   Influenza,inj,Quad PF,6+ Mos 06/15/2018   Meningococcal Mcv4o 11/21/2018   Moderna Sars-Covid-2 Vaccination 11/22/2019, 12/20/2019     Objective: Vital Signs: There were no vitals taken for this visit.   Physical Exam   Musculoskeletal Exam: ***  CDAI Exam: CDAI Score: -- Patient Global: --; Provider Global: -- Swollen: --; Tender: -- Joint Exam 02/10/2024   No joint exam has been documented for this visit   There is currently no information  documented on the homunculus. Go to the Rheumatology activity and complete the homunculus joint exam.  Investigation: No additional findings.  Imaging: DG Chest 2 View Result Date: 01/10/2024 CLINICAL DATA:  Cough, shortness of breath EXAM: CHEST - 2 VIEW COMPARISON:  09/03/2023 FINDINGS: The heart size and mediastinal contours are within normal limits. Both lungs are clear. The visualized skeletal structures are unremarkable. IMPRESSION: No active cardiopulmonary disease. Electronically Signed   By: Franky Crease M.D.   On: 01/10/2024 23:03    Recent Labs: Lab Results  Component Value Date   WBC 8.3 09/08/2023   HGB 11.2 (L) 09/08/2023   PLT 224 09/08/2023   NA 137 09/08/2023   K 3.6 09/08/2023   CL 104 09/08/2023   CO2 26 09/08/2023   GLUCOSE 101 (H) 09/08/2023   BUN 7 09/08/2023   CREATININE 0.78 09/08/2023   BILITOT 0.2 09/08/2023   ALKPHOS 70 09/08/2023   AST 10 (L) 09/08/2023   ALT 11 09/08/2023   PROT 7.2 09/08/2023   ALBUMIN 3.8 09/08/2023   CALCIUM 9.0 09/08/2023   GFRAA 147 08/28/2020    Speciality Comments: No specialty comments available.  Procedures:  No procedures performed Allergies: Blue dyes (parenteral), Blueberry [vaccinium angustifolium], Raspberry, Shellfish allergy, Amitriptyline , and Sumatriptan    Assessment / Plan:     Visit Diagnoses: No diagnosis found.  ***  Orders: No orders of the defined types were placed in this encounter.  No orders of the defined types were placed in this encounter.    Follow-Up Instructions: No follow-ups on file.   Shelba SHAUNNA Potters, RT  Note - This record has been created using [employer].  Chart creation errors have been sought, but may not always  have been located. Such creation errors do not reflect on  the standard of medical care.

## 2024-01-27 NOTE — Progress Notes (Deleted)
 Office Visit Note  Patient: Jacqueline Gonzalez             Date of Birth: July 08, 2001           MRN: [id number]             PCP: Macdonald Ping, NP Referring: Macdonald Ping, NP Visit Date: 02/10/2024   Subjective:  No chief complaint on file.   History of Present Illness: Jacqueline Gonzalez is a 23 y.o. female here for follow up for continued joint pain at multiple areas on HCQ 400 mg daily.    Previous HPI 10/07/2023 Jacqueline Gonzalez is a 23 y.o. female here for follow up for continued joint pain at multiple areas on HCQ 400 mg daily.   She experiences persistent abdominal pain throughout the day and night. A CT scan of the abdomen on January 19th did not reveal any abnormalities, and a previous endoscopy showed no ulcers or inflammatory lesions. She was initially recommended pantoprazole  for stomach acid suppression due to concerns about gastritis but has since switched to Mytesi and omeprazole  without significant improvement. She has not had an ultrasound to evaluate the liver or gallbladder.   She experiences nausea and diarrhea, with blood in her stool approximately every two weeks. She describes being usually constipated, with difficulty in bowel movements. She was evaluated by a gastroenterologist less than a month ago, who suggested her symptoms might be related to IBS. She was recently prescribed Cymbalta  by her psychiatrist, which she started a couple of weeks ago.   She reports joint pain and stiffness, particularly in the mornings, with numbness in her hands and tingling in her feet. She has been diagnosed with carpal tunnel syndrome in one hand. She experiences significant fatigue and difficulty performing daily activities, such as washing dishes and driving. She reports poor sleep, getting only about four hours per night, and sometimes sleeping excessively during the day. She has not had a sleep study or seen a neurologist for these issues.   She mentions a history of rashes,  which have been minor recently. She uses a cream, possibly triamcinolone, for her skin. She also takes gabapentin  but has not used muscle relaxers like Flexeril . She recalls a possible flu-like illness since her last visit but no major infections. She has not had any recent exposure to ticks or been tested for Lyme disease.    Previous HPI 07/09/2023 Jacqueline Gonzalez is a 23 y.o. female here for follow up for continued joint pain at multiple areas currently having trouble especially in her wrist and ankles.  She reports no significant improvement despite the initiation of Plaquenil  200 mg daily. They describe a new onset of skin issues, characterized by abnormal healing of scars and easy bruising, particularly after blood draws. The patient also reports constant pain, which is severe enough to consider daily hospital visits, but denies any specific aggravating factors. Over-the-counter pain medications, such as ibuprofen  and Tylenol , are currently being used for pain management.   The patient was recently seen by a gynecologist, who recommended a gastroenterology consult, scheduled for February. They also report a recent bout of bronchitis, which was treated with prednisone  and antibiotics. During this time, they also had a tooth infection, which was managed by extraction. The patient did not notice any significant changes while on the short course of steroids.   The patient also reports occasional blood in their stool, which varies from bright red to black. The patient also has a history of eczema, which has been  resistant to previous treatments. They deny any circulation problems in the colder weather.      Previous HPI 04/12/2023 Jacqueline Gonzalez is a 23 y.o. female here for follow up for continued joint pain at multiple areas currently having trouble especially in her wrist and ankles.  So far does not feel hydroxychloroquine  has changed her symptoms dramatically. Also still has ongoing lower  abdominal and pelvic pain chronically. She had laparoscopy for this with an adhesion found but not thought clinically important. Still also has persistent diarrhea and constipation.  Has been getting some itchy rash and describes redness along the inner sides and underneath her breasts was recommended to use her topical eczema cream but not seeing any benefit so far.   Previous HPI 02/09/2023 Jacqueline Gonzalez is a 23 y.o. female here for follow up for continued joint pain at multiple areas currently having trouble especially in her wrist and ankles.  Workup at our initial visit showed a high sedimentation rate high complement C3 and elevated RNP antibody titer so some concern for underlying inflammatory arthritis process.  She is noticing more symptoms around the ganglion cyst of the left wrist and pain especially with active movement.  Ankle pain and soreness especially with use but not noticing any specific nodules just generalized swelling.   Previous HPI 01/08/23 Jacqueline Gonzalez is a 23 y.o. female here for evaluation of positive ANA associated with joint pains and elevated sedimentation rate.  She reports longstanding history of joint pain in multiple areas going all the way back to adolescence.  However her current problems with persistent and sometimes activity limiting widespread pain has been more prominent in the past 1 year.  This is also associated with worsening chronic fatigue.  She did not recall any preceding serious infection.  She mostly describes this as being all over the but sometimes in particular has had more back and chronic pelvic pain.  Has had low back pain and radicular symptoms extending throughout the left leg up to include part of the perineum.  Also reports previous diagnosis with knee osteoarthritis.  Pain is varying in severity worse on some days than others not associated with much visible swelling or discoloration in affected areas.  Also sensitive to pressure or  temperature changes in affected areas.  Previous treatments tried include gabapentin , oxycodone , and high-dose ibuprofen  but did not find any of them highly effective. Lab testing looking into these increase symptoms in February showing positive ANA, sed rate of 40, and deficient vitamin D  at 4.6.  She was also started on 50,000 units weekly supplementation for vitamin D  but has not felt a noticeable difference. She has history of eczema has been treated with what sounds like topical emollients in dermatology office but does not currently follow-up.  Describes her skin breaking out and irritation if having prolonged sun exposure.  No specific lesions persisting mostly just the irritation.  She reports several episodes with cystic lesions in the skin under her armpits under the breasts and in the groin.  Sometimes with drainage.  No previous diagnosis of hidradenitis suppurativa. She intermittently gets thrush possibly associated with her inhaler treatment but she does not use this consistently.  Does not get frequent oral nasal ulcers.  She denies typical Raynaud's symptoms, lymphadenopathy, or history of blood clots. Is also had more trouble with migraine headaches during this year was started on injection treatments for the past 3 months.  Also has persistent irritable bowel symptoms mixed constipation and diarrhea on Bentyl  with  some benefit.  She experiences some kind of dizziness or vertiginous symptoms these come and go without a specific position or provocation.  Says sometimes last up to 1 hour at a time.   Family history includes an aunt with Graves' disease but no first-degree relative known autoimmune disease.   Labs reviewed 11/2022 TSH wnl   08/2022 ANA pos ESR 40 Vit D 4.6 RF neg CCP neg   No Rheumatology ROS completed.   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  11/04/2023   Chronic pain syndrome 11/04/2023   Insomnia secondary to chronic pain 11/04/2023   CFS (chronic fatigue syndrome) 11/04/2023   Fibromyalgia syndrome 10/07/2023   Intertrigo 04/12/2023   Seronegative inflammatory arthritis 02/09/2023   Precordial pain 01/28/2023   Positive ANA (antinuclear antibody) 01/08/2023   IBS (irritable bowel syndrome) 01/08/2023   Sedimentation rate elevation 01/08/2023   Polyarthralgia 01/08/2023   Vitamin D  deficiency 01/08/2023   PCOS (polycystic ovarian syndrome) 08/26/2022   LGSIL of cervix of undetermined significance 08/26/2022   History of PID 08/18/2022   Rectal abscess supralevator s/p I&D 03/14/2022 03/14/2022   Rectal pain    UTI (urinary tract infection) 03/10/2022   Rectal bleeding    Anal fissure    Abnormal CT scan, colon    Proctocolitis 03/09/2022   Menorrhagia with irregular cycle 11/18/2021   Dysmenorrhea 11/18/2021   Chronic anemia 11/17/2021   Lower abdominal pain 11/07/2021   Moderate episode of recurrent major depressive disorder (HCC) 06/23/2021   Low back pain 05/02/2021   Headache 09/03/2020   Nonspecific abdominal pain 08/30/2020   Ganglion cyst of dorsum of left wrist 11/03/2019   Constipation, chronic 01/21/2018   Irregular periods 01/21/2018   Generalized anxiety disorder 01/21/2018    Past Medical History:  Diagnosis Date   Anemia    Anxiety    Arthritis    knees   Chlamydia 11/13/2021   Constipation, chronic    Depression    Dyspnea    ON OCC   Environmental allergies    Ganglion cyst of dorsum of left wrist    RECURRENT   GERD (gastroesophageal reflux disease)    watches diet   Headache MIGRAINE    History of hypertension    IN PAST, NO RECENT HTN PER PT 01-19-2023   IBS (irritable bowel syndrome)    Lactose intolerance    PCOS (polycystic ovarian syndrome)    Pre-diabetes     Family History  Problem Relation Age of Onset   Heart failure Mother    Migraines Mother    Irritable bowel  syndrome Mother    Other Mother 73       Tricuspid valve regurgitation   Asthma Father    Ovarian cancer Maternal Aunt    Graves' disease Paternal Aunt    Hypertension Maternal Grandmother    Diabetes Maternal Grandmother    Hypertension Maternal Grandfather    Diabetes Maternal Grandfather    Hypertension Paternal Grandmother    Stroke Paternal Grandmother    Diabetes Paternal Grandmother    Hypertension Paternal Grandfather    Diabetes Paternal Grandfather    Kidney disease Paternal Grandfather    Stomach cancer Neg Hx    Esophageal cancer Neg Hx    Colon cancer Neg Hx    Past Surgical History:  Procedure Laterality Date   GANGLION CYST EXCISION Left 04/24/2021  Procedure: left recurrent dorsal carpal ganglion excision;  Surgeon: Alyse Agent, MD;  Location: [facility];  Service: Orthopedics;  Laterality: Left;   INCISION AND DRAINAGE PERIRECTAL ABSCESS N/A 03/14/2022   Procedure: IRRIGATION AND DEBRIDEMENT PERIRECTAL ABSCESS;  Surgeon: Sheldon Standing, MD;  Location: [facility];  Service: General;  Laterality: N/A;   LAPAROSCOPY N/A 02/10/2023   Procedure: LAPAROSCOPY DIAGNOSTIC;  Surgeon: Izell Harari, MD;  Location: [facility];  Service: Gynecology;  Laterality: N/A;   TONSILLECTOMY AND ADENOIDECTOMY     AGE 70   WISDOM TOOTH EXTRACTION  03/18/2021   WRIST GANGLION EXCISION Left 02/2020   Social History   Social History Narrative   Not on file   Immunization History  Administered Date(s) Administered   HPV 9-valent 02/19/2012, 12/19/2012, 05/01/2013   Influenza,inj,Quad PF,6+ Mos 06/15/2018   Meningococcal Mcv4o 11/21/2018   Moderna Sars-Covid-2 Vaccination 11/22/2019, 12/20/2019     Objective: Vital Signs: There were no vitals taken for this visit.   Physical Exam   Musculoskeletal Exam: ***  CDAI Exam: CDAI Score: -- Patient Global: --; Provider Global: -- Swollen: --; Tender: -- Joint Exam 02/10/2024   No joint exam has  been documented for this visit   There is currently no information documented on the homunculus. Go to the Rheumatology activity and complete the homunculus joint exam.  Investigation: No additional findings.  Imaging: DG Chest 2 View Result Date: 01/10/2024 CLINICAL DATA:  Cough, shortness of breath EXAM: CHEST - 2 VIEW COMPARISON:  09/03/2023 FINDINGS: The heart size and mediastinal contours are within normal limits. Both lungs are clear. The visualized skeletal structures are unremarkable. IMPRESSION: No active cardiopulmonary disease. Electronically Signed   By: Franky Crease M.D.   On: 01/10/2024 23:03    Recent Labs: Lab Results  Component Value Date   WBC 8.3 09/08/2023   HGB 11.2 (L) 09/08/2023   PLT 224 09/08/2023   NA 137 09/08/2023   K 3.6 09/08/2023   CL 104 09/08/2023   CO2 26 09/08/2023   GLUCOSE 101 (H) 09/08/2023   BUN 7 09/08/2023   CREATININE 0.78 09/08/2023   BILITOT 0.2 09/08/2023   ALKPHOS 70 09/08/2023   AST 10 (L) 09/08/2023   ALT 11 09/08/2023   PROT 7.2 09/08/2023   ALBUMIN 3.8 09/08/2023   CALCIUM 9.0 09/08/2023   GFRAA 147 08/28/2020    Speciality Comments: No specialty comments available.  Procedures:  No procedures performed Allergies: Blue dyes (parenteral), Blueberry [vaccinium angustifolium], Raspberry, Shellfish allergy, Amitriptyline , and Sumatriptan    Assessment / Plan:     Visit Diagnoses: No diagnosis found.  ***  Orders: No orders of the defined types were placed in this encounter.  No orders of the defined types were placed in this encounter.    Follow-Up Instructions: No follow-ups on file.   Shelba SHAUNNA Potters, RT  Note - This record has been created using [employer].  Chart creation errors have been sought, but may not always  have been located. Such creation errors do not reflect on  the standard of medical care.

## 2024-02-01 ENCOUNTER — Ambulatory Visit: Admitting: Gastroenterology

## 2024-02-01 ENCOUNTER — Other Ambulatory Visit (INDEPENDENT_AMBULATORY_CARE_PROVIDER_SITE_OTHER)

## 2024-02-01 ENCOUNTER — Encounter: Payer: Self-pay | Admitting: Gastroenterology

## 2024-02-01 DIAGNOSIS — K219 Gastro-esophageal reflux disease without esophagitis: Secondary | ICD-10-CM

## 2024-02-01 DIAGNOSIS — K580 Irritable bowel syndrome with diarrhea: Secondary | ICD-10-CM

## 2024-02-01 DIAGNOSIS — K602 Anal fissure, unspecified: Secondary | ICD-10-CM

## 2024-02-01 DIAGNOSIS — K582 Mixed irritable bowel syndrome: Secondary | ICD-10-CM | POA: Diagnosis not present

## 2024-02-01 LAB — COMPREHENSIVE METABOLIC PANEL WITH GFR
ALT: 34 U/L (ref 0–35)
AST: 25 U/L (ref 0–37)
Albumin: 4.2 g/dL (ref 3.5–5.2)
Alkaline Phosphatase: 86 U/L (ref 39–117)
BUN: 8 mg/dL (ref 6–23)
CO2: 28 meq/L (ref 19–32)
Calcium: 9.3 mg/dL (ref 8.4–10.5)
Chloride: 103 meq/L (ref 96–112)
Creatinine, Ser: 0.68 mg/dL (ref 0.40–1.20)
GFR: 123.24 mL/min (ref >60.00)
Glucose, Bld: 91 mg/dL (ref 70–99)
Potassium: 3.6 meq/L (ref 3.5–5.1)
Sodium: 137 meq/L (ref 135–145)
Total Bilirubin: 0.3 mg/dL (ref 0.2–1.2)
Total Protein: 7.9 g/dL (ref 6.0–8.3)

## 2024-02-01 LAB — CBC WITH DIFFERENTIAL/PLATELET
Basophils Absolute: 0 K/uL (ref 0.0–0.1)
Basophils Relative: 0.8 % (ref 0.0–3.0)
Eosinophils Absolute: 0.1 K/uL (ref 0.0–0.7)
Eosinophils Relative: 2.4 % (ref 0.0–5.0)
HCT: 33.2 % — ABNORMAL LOW (ref 36.0–46.0)
Hemoglobin: 10.5 g/dL — ABNORMAL LOW (ref 12.0–15.0)
Lymphocytes Relative: 43.1 % (ref 12.0–46.0)
Lymphs Abs: 2.6 K/uL (ref 0.7–4.0)
MCHC: 31.6 g/dL (ref 30.0–36.0)
MCV: 77.6 fl — ABNORMAL LOW (ref 78.0–100.0)
Monocytes Absolute: 0.6 K/uL (ref 0.1–1.0)
Monocytes Relative: 9.3 % (ref 3.0–12.0)
Neutro Abs: 2.7 K/uL (ref 1.4–7.7)
Neutrophils Relative %: 44.4 % (ref 43.0–77.0)
Platelets: 281 K/uL (ref 150.0–400.0)
RBC: 4.28 Mil/uL (ref 3.87–5.11)
RDW: 15 % (ref 11.5–15.5)
WBC: 6 K/uL (ref 4.0–10.5)

## 2024-02-01 MED ORDER — OMEPRAZOLE 20 MG PO CPDR: 20.0000 mg | DELAYED_RELEASE_CAPSULE | Freq: Two times a day (BID) | ORAL | Status: AC

## 2024-02-01 NOTE — Patient Instructions (Addendum)
 _______________________________________________________  If your blood pressure at your visit was 140/90 or greater, please contact your primary care physician to follow up on this.  _______________________________________________________  If you are age 23 or older, your body mass index should be between 23-30. Your Body mass index is 41.51 kg/m. If this is out of the aforementioned range listed, please consider follow up with your Primary Care Provider.  If you are age 35 or younger, your body mass index should be between 19-25. Your Body mass index is 41.51 kg/m. If this is out of the aformentioned range listed, please consider follow up with your Primary Care Provider.   ________________________________________________________  The [facility] providers would like to encourage you to use MYCHART to communicate with providers for non-urgent requests or questions.  Due to long hold times on the telephone, sending your provider a message by [facility] may be a faster and more efficient way to get a response.  Please allow 48 business hours for a response.  Please remember that this is for non-urgent requests.  _______________________________________________________  Your provider has requested that you go to the basement level for lab work before leaving today. Press B on the elevator. The lab is located at the first door on the left as you exit the elevator.  Recticare use as directed on insert IB Gard use as directed on insert Gaviscon at bedtime   CONTINUE loperamide as needed.  INCREASE omeprazole  to one capsule two times daily  Due to recent changes in healthcare laws, you may see the results of your imaging and laboratory studies on MyChart before your provider has had a chance to review them.  We understand that in some cases there may be results that are confusing or concerning to you. Not all laboratory results come back in the same time frame and the provider may be waiting for  multiple results in order to interpret others.  Please give us  48 hours in order for your provider to thoroughly review all the results before contacting the office for clarification of your results.   Thank you for entrusting me with your care and choosing [facility].  Dr Stacia

## 2024-02-01 NOTE — Progress Notes (Unsigned)
 HPI :        11/20/2021 pelvic ultrasound with submucosal fibroid versus polyp measuring 11 mm and numerous bilateral ovarian follicles with peripheral orientation, correlate for polycystic ovarian disease and trace fluid in the pelvis.     03/03/2022 patient seen in clinic and described that she was told that she likely had IBS with constipation daily.  She was having a bowel movement every once every 4 days or so which was hard to pass.  Linzess 72 mcg daily gave her terrible diarrhea.  Also describes seeing some bright red blood on the toilet paper mixed with stool.  She was also being worked up for PCOS.  On exam patient had an anal fissure.  She was tried on Amitiza  8 mcg twice a day with food.  Also prescribed Hyoscyamine  1 tab every 4-6 hours.  Prescribed Diltiazem  2% ointment 3 times daily x6 to 8 weeks for fissure.    03/09/2022-03/16/22 admission to the hospital for sepsis secondary to chlamydia PID and chlamydia proctocolitis associated with perirectal abscess.  She was treated with antibiotics Ceftriaxone  and Flagyl  as well as Doxycycline .  She did undergo I&D of supralevator rectal abscess on 8/26 with moderate proctitis.  Told to continue MiraLAX  twice daily.  8/25 MRI of the pelvis without contrast showed progressive rectal wall thickening suspicious for infectious/inflammatory proctocolitis.    04/16/2022 CT the abdomen pelvis with contrast of the ED for ongoing abdominal pain.  This was normal.    04/30/2022 patient seen in clinic by me and at that time having issues with diarrhea, nausea, headache and vomiting as well as some blood and rectal pain.  Noted for with addition of Dicyclomine  for the past week.  Was using Diltiazem  for fissure.  On exam continues small posterior fissure.  At that time reviewed multiple imaging studies most recently a CT in September 2023.  Rectal exam still with posterior fissure.  Discussed EGD and colonoscopy which were scheduled with Dr. Federico.  Continued  on Diltiazem , RectiCare with lidocaine  and increase Dicyclomine  to 20 4 times daily as well as started Omeprazole  40 every morning.    05/04/2022 colonoscopy with nonbleeding internal hemorrhoids and a small anal fissure.  EGD was normal with some gastritis.  Pathology showed mild chronic gastritis.  Told to use Omeprazole  40 mg p.o. twice daily for 8 weeks.    08/25/2022 pelvic ultrasound with findings suggesting polycystic ovarian syndrome.  Her gynecologist told her he did not feel that the PCOS was causing pain.    09/24/2022 patient seen in clinic by me and did not remember taking Dicyclomine , unsure if it helped.  Increased indigestion regardless of taking omeprazole  and Famotidine .  Chronic achy pain in her lower abdomen.  Restarted on Hyoscyamine , switched to Pantoprazole  40 twice daily and continued on Famotidine .    01/2023 diagnostic laparoscopically for chronic pelvic pain with OB/GYN, only findings were adhesions.  Continued pain postsurgery.    03/30/2023 CTAP with no acute abnormality.    2//25 patient saw OB/GYN for chronic pelvic pain with no relief.    08/25/2023 patient seen in clinic by [facility].  At that time discussed that she may benefit from Cymbalta  or similar medication which can help reduce pain signals.  Also discuss alternative therapies such as acupuncture.  At that time noted she had visited the ED several times for various complaints.  At that time continued chronic abdominal and pelvic pain that was constant with fluctuations in intensity despite Hyoscyamine  and Tylenol .  Also significant difficulty with eating due to pain and associated symptoms of acid reflux and nausea.  Patient was seeing a psychiatrist.  Plan at that time was IBgard.  Recommended getting in touch with psychiatrist to discuss possibly of Cymbalta  given that she had a reaction to previous TCA.  Continued on Pantoprazole  40 twice daily and Famotidine  at bedtime.    09/08/2023 CBC and CMP normal.  Lipase  low.    10/07/2023 vitamin D  found to be low    10/18/2023 CTAP without contrast with under distended colon versus possibly mild colitis.   4 /3/25 patient followed OB/GYN in regards to lower abdominal pain.  She had just recently started taking Flexeril  and was unsure if it was helping or not.  She was prescribed Provera  for amenorrhea.  She did have some uncomplicated abdominal wall trigger point injections.    Today, the patient reports that she had pain injections in her abdominal wall which helped for maybe 2 days and then she felt worsened abdominal pain, she is not sure if it was related or not.  Continues with her chronic abdominal pain which hinders her from working.  She is wondering what the end goal is here.  Apparently also Cymbalta  at 30 mg currently, discussing titration up over the next week with her psychiatrist.  She did try IBgard and thinks it helped her have more regular bowel movements and maybe help with the pain slightly    Past Medical History:  Diagnosis Date   Anemia    Anxiety    Arthritis    knees   Chlamydia 11/13/2021   Constipation, chronic    Depression    Dyspnea    ON OCC   Environmental allergies    Ganglion cyst of dorsum of left wrist    RECURRENT   GERD (gastroesophageal reflux disease)    watches diet   Headache MIGRAINE    History of hypertension    IN PAST, NO RECENT HTN PER PT 01-19-2023   IBS (irritable bowel syndrome)    Lactose intolerance    PCOS (polycystic ovarian syndrome)    Pre-diabetes      Past Surgical History:  Procedure Laterality Date   GANGLION CYST EXCISION Left 04/24/2021   Procedure: left recurrent dorsal carpal ganglion excision;  Surgeon: Alyse Agent, MD;  Location: [facility];  Service: Orthopedics;  Laterality: Left;   INCISION AND DRAINAGE PERIRECTAL ABSCESS N/A 03/14/2022   Procedure: IRRIGATION AND DEBRIDEMENT PERIRECTAL ABSCESS;  Surgeon: Sheldon Standing, MD;  Location: [facility];  Service: General;   Laterality: N/A;   LAPAROSCOPY N/A 02/10/2023   Procedure: LAPAROSCOPY DIAGNOSTIC;  Surgeon: Izell Harari, MD;  Location: [facility];  Service: Gynecology;  Laterality: N/A;   TONSILLECTOMY AND ADENOIDECTOMY     AGE 4   WISDOM TOOTH EXTRACTION  03/18/2021   WRIST GANGLION EXCISION Left 02/2020   Family History  Problem Relation Age of Onset   Heart failure Mother    Migraines Mother    Irritable bowel syndrome Mother    Other Mother 39       Tricuspid valve regurgitation   Asthma Father    Ovarian cancer Maternal Aunt    Graves' disease Paternal Aunt    Hypertension Maternal Grandmother    Diabetes Maternal Grandmother    Hypertension Maternal Grandfather    Diabetes Maternal Grandfather    Hypertension Paternal Grandmother    Stroke Paternal Grandmother    Diabetes Paternal Grandmother    Hypertension  Paternal Grandfather    Diabetes Paternal Grandfather    Kidney disease Paternal Grandfather    Stomach cancer Neg Hx    Esophageal cancer Neg Hx    Colon cancer Neg Hx    Social History   Tobacco Use   Smoking status: Never    Passive exposure: Past   Smokeless tobacco: Never  Vaping Use   Vaping status: Never Used  Substance Use Topics   Alcohol use: Yes    Comment: occ   Drug use: Never    Comment: Smokes CBD occ   Current Outpatient Medications  Medication Sig Dispense Refill   ACCRUFER 30 MG CAPS TAKE 1 CAPSULE BY MOUTH TWICE A DAY BEFORE MEALS FOR ANEMIA.     albuterol  (VENTOLIN  HFA) 108 (90 Base) MCG/ACT inhaler Inhale into the lungs every 6 (six) hours as needed for wheezing or shortness of breath.     buPROPion (WELLBUTRIN XL) 150 MG 24 hr tablet Take by mouth.     cetirizine  (ZYRTEC ) 10 MG tablet Take 1 tablet (10 mg total) by mouth daily. 30 tablet 0   cyclobenzaprine  (FLEXERIL ) 10 MG tablet Take 1 tablet (10 mg total) by mouth at bedtime. 30 tablet 2   DULoxetine  (CYMBALTA ) 30 MG capsule Take 30 mg by mouth every morning.      famotidine  (PEPCID ) 40 MG tablet TAKE 1 TABLET BY MOUTH EVERYDAY AT BEDTIME 90 tablet 3   fluconazole  (DIFLUCAN ) 150 MG tablet Take 1 tablet (150 mg total) by mouth daily. 1 tablet 0   FLUoxetine  (PROZAC ) 40 MG capsule Take 1 capsule (40 mg total) by mouth daily. 90 capsule 3   fluticasone  (FLONASE ) 50 MCG/ACT nasal spray Place 2 sprays into both nostrils daily. 16 g 0   fluticasone  (FLONASE ) 50 MCG/ACT nasal spray Place 2 sprays into both nostrils daily. 16 g 0   hydroxychloroquine  (PLAQUENIL ) 200 MG tablet Take 2 tablets (400 mg total) by mouth daily. 180 tablet 0   ibuprofen  (ADVIL ) 600 MG tablet Take 1 tablet (600 mg total) by mouth every 6 (six) hours as needed. 30 tablet 0   lidocaine  (XYLOCAINE ) 5 % ointment Apply 1 Application topically as needed. 35 g 0   medroxyPROGESTERone  (PROVERA ) 10 MG tablet Take 1 tablet (10 mg total) by mouth daily. Use for ten days 10 tablet 0   omeprazole  (PRILOSEC) 20 MG capsule Take 1 capsule (20 mg total) by mouth daily. 30 capsule 0   ondansetron  (ZOFRAN ) 4 MG tablet Take 4 mg by mouth 2 (two) times daily as needed.     oxyCODONE -acetaminophen  (PERCOCET/ROXICET) 5-325 MG tablet Take 1 tablet by mouth every 6 (six) hours as needed. 8 tablet 0   pantoprazole  (PROTONIX ) 40 MG tablet Take 1 tablet (40 mg total) by mouth 2 (two) times daily before a meal. 60 tablet 6   Psyllium (METAMUCIL) 48.57 % POWD 1 spoonful daily for the next 14 days     Vitamin D , Ergocalciferol , (DRISDOL) 1.25 MG (50000 UNIT) CAPS capsule Take 50,000 Units by mouth every 7 (seven) days.     VRAYLAR 1.5 MG capsule Take 1.5 mg by mouth daily.     ADVAIR DISKUS 100-50 MCG/ACT AEPB Inhale 1 puff into the lungs 2 (two) times daily. (Patient not taking: Reported on 11/15/2023)     clindamycin  (CLEOCIN ) 300 MG capsule Take 1 capsule (300 mg total) by mouth 3 (three) times daily. 30 capsule 0   Drospirenone  (SLYND ) 4 MG TABS Take 1 tablet (4 mg total) by mouth  daily. (Patient not taking: Reported  on 02/01/2024) 90 tablet 3   DTx App - Gastrointestinal [facility] IBS) MISC Use as directed 11 each 1   EMGALITY 120 MG/ML SOAJ Inject into the skin.     FLUoxetine  (PROZAC ) 20 MG capsule Take 60 mg by mouth every morning.     gabapentin  (NEURONTIN ) 100 MG capsule Take 1 capsule (100 mg total) by mouth 3 (three) times daily. 30 capsule 0   gabapentin  (NEURONTIN ) 300 MG capsule Take 300 mg by mouth as needed.     Galcanezumab-gnlm (EMGALITY [place]) Inject 1 Dose into the skin every 30 (thirty) days. (Patient not taking: Reported on 02/01/2024)     hydrOXYzine  (VISTARIL ) 25 MG capsule Take 25 mg by mouth 2 (two) times daily as needed. (Patient not taking: Reported on 02/01/2024)     pregabalin (LYRICA) 50 MG capsule Take 50 mg by mouth 2 (two) times daily. (Patient not taking: Reported on 02/01/2024)     propranolol (INDERAL) 60 MG tablet Take 60 mg by mouth 2 (two) times daily. (Patient not taking: Reported on 02/01/2024)     No current facility-administered medications for this visit.   Allergies  Allergen Reactions   Blue Dyes (Parenteral) Anaphylaxis   Blueberry [Vaccinium Angustifolium] Anaphylaxis   Raspberry Anaphylaxis   Shellfish Allergy Anaphylaxis    ALL SHELLFISH   Amitriptyline  Other (See Comments)    Fast heart rate and throat closing   Sumatriptan  Swelling    Throat swelling/Increased HR per pt     Review of Systems: All systems reviewed and negative except where noted in HPI.    DG Chest 2 View Result Date: 01/10/2024 CLINICAL DATA:  Cough, shortness of breath EXAM: CHEST - 2 VIEW COMPARISON:  09/03/2023 FINDINGS: The heart size and mediastinal contours are within normal limits. Both lungs are clear. The visualized skeletal structures are unremarkable. IMPRESSION: No active cardiopulmonary disease. Electronically Signed   By: Franky Crease M.D.   On: 01/10/2024 23:03    Physical Exam: There were no vitals taken for this visit. Constitutional: Pleasant,well-developed, ***female  in no acute distress. HEENT: Normocephalic and atraumatic. Conjunctivae are normal. No scleral icterus. Neck supple.  Cardiovascular: Normal rate, regular rhythm.  Pulmonary/chest: Effort normal and breath sounds normal. No wheezing, rales or rhonchi. Abdominal: Soft, nondistended, nontender. Bowel sounds active throughout. There are no masses palpable. No hepatomegaly. Extremities: no edema Lymphadenopathy: No cervical adenopathy noted. Neurological: Alert and oriented to person place and time. Skin: Skin is warm and dry. No rashes noted. Psychiatric: Normal mood and affect. Behavior is normal.  CBC    Component Value Date/Time   WBC 8.3 09/08/2023 0830   RBC 4.18 09/08/2023 0830   HGB 11.2 (L) 09/08/2023 0830   HGB 11.1 08/24/2023 1434   HCT 35.6 (L) 09/08/2023 0830   HCT 35.5 08/24/2023 1434   PLT 224 09/08/2023 0830   PLT 276 08/24/2023 1434   MCV 85.2 09/08/2023 0830   MCV 84 08/24/2023 1434   MCH 26.8 09/08/2023 0830   MCHC 31.5 09/08/2023 0830   RDW 13.2 09/08/2023 0830   RDW 13.8 08/24/2023 1434   LYMPHSABS 2.8 09/08/2023 0830   MONOABS 0.6 09/08/2023 0830   EOSABS 0.1 09/08/2023 0830   BASOSABS 0.0 09/08/2023 0830    CMP     Component Value Date/Time   NA 137 09/08/2023 0830   NA 138 08/28/2020 1600   K 3.6 09/08/2023 0830   CL 104 09/08/2023 0830   CO2 26 09/08/2023 0830  GLUCOSE 101 (H) 09/08/2023 0830   BUN 7 09/08/2023 0830   BUN 6 08/28/2020 1600   CREATININE 0.78 09/08/2023 0830   CALCIUM 9.0 09/08/2023 0830   PROT 7.2 09/08/2023 0830   PROT 7.6 08/28/2020 1600   ALBUMIN 3.8 09/08/2023 0830   ALBUMIN 4.3 08/28/2020 1600   AST 10 (L) 09/08/2023 0830   ALT 11 09/08/2023 0830   ALKPHOS 70 09/08/2023 0830   BILITOT 0.2 09/08/2023 0830   BILITOT 0.2 08/28/2020 1600   GFRNONAA >60 09/08/2023 0830   GFRAA 147 08/28/2020 1600       Latest Ref Rng & Units 09/08/2023    8:30 AM 09/03/2023   10:58 PM 08/24/2023    2:34 PM  CBC EXTENDED  WBC 4.0 -  10.5 K/uL 8.3  5.6  7.0   RBC 3.87 - 5.11 MIL/uL 4.18  4.31  4.23   Hemoglobin 12.0 - 15.0 g/dL 88.7  88.3  88.8   HCT 36.0 - 46.0 % 35.6  37.0  35.5   Platelets 150 - 400 K/uL 224  268  276   NEUT# 1.7 - 7.7 K/uL 4.8     Lymph# 0.7 - 4.0 K/uL 2.8         ASSESSMENT AND PLAN:  Tann, Samandra, NP

## 2024-02-04 ENCOUNTER — Other Ambulatory Visit

## 2024-02-04 DIAGNOSIS — K219 Gastro-esophageal reflux disease without esophagitis: Secondary | ICD-10-CM

## 2024-02-04 DIAGNOSIS — K582 Mixed irritable bowel syndrome: Secondary | ICD-10-CM

## 2024-02-04 DIAGNOSIS — K602 Anal fissure, unspecified: Secondary | ICD-10-CM

## 2024-02-07 LAB — FECAL LACTOFERRIN, QUANT
Fecal Lactoferrin: NEGATIVE
MICRO NUMBER:: 16717686
SPECIMEN QUALITY:: ADEQUATE

## 2024-02-07 LAB — CLOSTRIDIUM DIFFICILE TOXIN B, QUALITATIVE, REAL-TIME PCR: Toxigenic C. Difficile by PCR: NOT DETECTED

## 2024-02-08 ENCOUNTER — Ambulatory Visit: Payer: Self-pay | Admitting: Gastroenterology

## 2024-02-08 DIAGNOSIS — D509 Iron deficiency anemia, unspecified: Secondary | ICD-10-CM

## 2024-02-08 NOTE — Progress Notes (Signed)
 Jacqueline Gonzalez,  Your hemoglobin was down a little from earlier this year but not far your baseline over the past 1-2 years.  Your MCV is low, which can indicate low iron levels. I would like to check an iron panel, and if low, recommend taking iron supplements.  Your liver, kidney function and electrolytes were all normal. The stool tests were negative for C diff and for evidence of inflammatory diarrhea.  I think your diarrhea is likely related to recent antibiotics; ok to continue as needed loperamide.  Please submit another blood test to check iron levels at your convenience.

## 2024-02-10 ENCOUNTER — Ambulatory Visit: Admitting: Internal Medicine

## 2024-02-10 DIAGNOSIS — R102 Pelvic and perineal pain: Secondary | ICD-10-CM

## 2024-02-10 DIAGNOSIS — M1380 Other specified arthritis, unspecified site: Secondary | ICD-10-CM

## 2024-02-10 DIAGNOSIS — E559 Vitamin D deficiency, unspecified: Secondary | ICD-10-CM

## 2024-02-10 DIAGNOSIS — Z79899 Other long term (current) drug therapy: Secondary | ICD-10-CM

## 2024-02-10 DIAGNOSIS — M797 Fibromyalgia: Secondary | ICD-10-CM

## 2024-02-25 NOTE — Progress Notes (Signed)
 Office Visit Note  Patient: Jacqueline Gonzalez             Date of Birth: 2001-01-11           MRN: [id number]             PCP: Macdonald Ping, NP Referring: Macdonald Ping, NP Visit Date: 03/09/2024   Subjective:  Follow-up (Body pain is the knees and a growth on her butt)    Discussed the use of AI scribe software for clinical note transcription with the patient, who gave verbal consent to proceed.  History of Present Illness   Jacqueline Gonzalez is a 23 y.o. female here for follow up  for continued joint pain at multiple areas on HCQ 400 mg daily.  She complains of worsening knee pain and abdominal pain with blood in stool.  She experiences worsening knee pain, particularly when trying to get up, walking down stairs, and driving. The pain is severe and more pronounced than her previous Gonzalez pain. She has been on hydroxychloroquine , but has not noticed any improvement in her symptoms despite a change in dosage a few months ago. She also takes Cymbalta  but does not feel it helps with her joint pain.  She experiences abdominal pain intermittently and has noticed bright red blood in her stool, which she suspects might be due to a fissure. The abdominal pain is severe, and she has not mentioned any specific treatments for this issue. She confirms the presence of bright red blood in her stool and severe abdominal pain.  She mentions having had a cold a few weeks ago and attributes some of her symptoms to a mold problem in her house, which was inadequately addressed by cleaning with Lysol and spray painting over it.      Previous HPI 10/07/2023 Jacqueline Gonzalez is a 23 y.o. female here for follow up for continued joint pain at multiple areas on HCQ 400 mg daily.   She experiences persistent abdominal pain throughout the day and night. A CT scan of the abdomen on January 19th did not reveal any abnormalities, and a previous endoscopy showed no ulcers or inflammatory lesions. She was initially  recommended pantoprazole  for stomach acid suppression due to concerns about gastritis but has since switched to Mytesi and omeprazole  without significant improvement. She has not had an ultrasound to evaluate the liver or gallbladder.   She experiences nausea and diarrhea, with blood in her stool approximately every two weeks. She describes being usually constipated, with difficulty in bowel movements. She was evaluated by a gastroenterologist less than a month ago, who suggested her symptoms might be related to IBS. She was recently prescribed Cymbalta  by her psychiatrist, which she started a couple of weeks ago.   She reports joint pain and stiffness, particularly in the mornings, with numbness in her hands and tingling in her feet. She has been diagnosed with carpal tunnel syndrome in one hand. She experiences significant fatigue and difficulty performing daily activities, such as washing dishes and driving. She reports poor sleep, getting only about four hours per night, and sometimes sleeping excessively during the day. She has not had a sleep study or seen a neurologist for these issues.   She mentions a history of rashes, which have been minor recently. She uses a cream, possibly triamcinolone, for her skin. She also takes gabapentin  but has not used muscle relaxers like Flexeril . She recalls a possible flu-like illness since her last visit but no major infections. She has not had  any recent exposure to ticks or been tested for Lyme disease.    Previous HPI 07/09/2023 Jacqueline Gonzalez is a 23 y.o. female here for follow up for continued joint pain at multiple areas currently having trouble especially in her wrist and ankles.  She reports no significant improvement despite the initiation of Plaquenil  200 mg daily. They describe a new onset of skin issues, characterized by abnormal healing of scars and easy bruising, particularly after blood draws. The patient also reports constant pain, which is  severe enough to consider daily hospital visits, but denies any specific aggravating factors. Over-the-counter pain medications, such as ibuprofen  and Tylenol , are currently being used for pain management.   The patient was recently seen by a gynecologist, who recommended a gastroenterology consult, scheduled for February. They also report a recent bout of bronchitis, which was treated with prednisone  and antibiotics. During this time, they also had a tooth infection, which was managed by extraction. The patient did not notice any significant changes while on the short course of steroids.   The patient also reports occasional blood in their stool, which varies from bright red to black. The patient also has a history of eczema, which has been resistant to previous treatments. They deny any circulation problems in the colder weather.      Previous HPI 04/12/2023 Jacqueline Gonzalez is a 23 y.o. female here for follow up for continued joint pain at multiple areas currently having trouble especially in her wrist and ankles.  So far does not feel hydroxychloroquine  has changed her symptoms dramatically. Also still has ongoing lower abdominal and pelvic pain chronically. She had laparoscopy for this with an adhesion found but not thought clinically important. Still also has persistent diarrhea and constipation.  Has been getting some itchy rash and describes redness along the inner sides and underneath her breasts was recommended to use her topical eczema cream but not seeing any benefit so far.   Previous HPI 02/09/2023 Jacqueline Gonzalez is a 23 y.o. female here for follow up for continued joint pain at multiple areas currently having trouble especially in her wrist and ankles.  Workup at our initial visit showed a high sedimentation rate high complement C3 and elevated RNP antibody titer so some concern for underlying inflammatory arthritis process.  She is noticing more symptoms around the ganglion cyst of the  left wrist and pain especially with active movement.  Ankle pain and soreness especially with use but not noticing any specific nodules just generalized swelling.   Previous HPI 01/08/23 Jacqueline Gonzalez is a 23 y.o. female here for evaluation of positive ANA associated with joint pains and elevated sedimentation rate.  She reports longstanding history of joint pain in multiple areas going all the way Gonzalez to adolescence.  However her current problems with persistent and sometimes activity limiting widespread pain has been more prominent in the past 1 year.  This is also associated with worsening chronic fatigue.  She did not recall any preceding serious infection.  She mostly describes this as being all over the but sometimes in particular has had more Gonzalez and chronic pelvic pain.  Has had low Gonzalez pain and radicular symptoms extending throughout the left leg up to include part of the perineum.  Also reports previous diagnosis with knee osteoarthritis.  Pain is varying in severity worse on some days than others not associated with much visible swelling or discoloration in affected areas.  Also sensitive to pressure or temperature changes in affected areas.  Previous treatments  tried include gabapentin , oxycodone , and high-dose ibuprofen  but did not find any of them highly effective. Lab testing looking into these increase symptoms in February showing positive ANA, sed rate of 40, and deficient vitamin D  at 4.6.  She was also started on 50,000 units weekly supplementation for vitamin D  but has not felt a noticeable difference. She has history of eczema has been treated with what sounds like topical emollients in dermatology office but does not currently follow-up.  Describes her skin breaking out and irritation if having prolonged sun exposure.  No specific lesions persisting mostly just the irritation.  She reports several episodes with cystic lesions in the skin under her armpits under the breasts and in the  groin.  Sometimes with drainage.  No previous diagnosis of hidradenitis suppurativa. She intermittently gets thrush possibly associated with her inhaler treatment but she does not use this consistently.  Does not get frequent oral nasal ulcers.  She denies typical Raynaud's symptoms, lymphadenopathy, or history of blood clots. Is also had more trouble with migraine headaches during this year was started on injection treatments for the past 3 months.  Also has persistent irritable bowel symptoms mixed constipation and diarrhea on Bentyl  with some benefit.  She experiences some kind of dizziness or vertiginous symptoms these come and go without a specific position or provocation.  Says sometimes last up to 1 hour at a time.   Family history includes an aunt with Graves' disease but no first-degree relative known autoimmune disease.   Labs reviewed 11/2022 TSH wnl   08/2022 ANA pos ESR 40 Vit D 4.6 RF neg CCP neg    Review of Systems  Constitutional:  Positive for fatigue.  HENT:  Positive for mouth dryness. Negative for mouth sores.   Eyes:  Negative for dryness.  Respiratory:  Positive for shortness of breath.   Cardiovascular:  Positive for chest pain and palpitations.  Gastrointestinal:  Positive for blood in stool, constipation and diarrhea.  Endocrine: Positive for increased urination.  Genitourinary:  Negative for involuntary urination.  Musculoskeletal:  Positive for joint pain, gait problem, joint pain, myalgias, muscle weakness, morning stiffness, muscle tenderness and myalgias. Negative for joint swelling.  Skin:  Positive for color change and sensitivity to sunlight. Negative for rash and hair loss.  Allergic/Immunologic: Positive for susceptible to infections.  Neurological:  Positive for dizziness and headaches.  Hematological:  Negative for swollen glands.  Psychiatric/Behavioral:  Positive for depressed mood and sleep disturbance. The patient is nervous/anxious.      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 11/04/2023   Chronic pain syndrome 11/04/2023   Insomnia secondary to chronic pain 11/04/2023   CFS (chronic fatigue syndrome) 11/04/2023   Fibromyalgia syndrome 10/07/2023   Intertrigo 04/12/2023   Seronegative inflammatory arthritis 02/09/2023   Precordial pain 01/28/2023   Positive ANA (antinuclear antibody) 01/08/2023   IBS (irritable bowel syndrome) 01/08/2023   Sedimentation rate elevation 01/08/2023   Polyarthralgia 01/08/2023   Vitamin D  deficiency 01/08/2023   PCOS (polycystic ovarian syndrome) 08/26/2022   LGSIL of cervix of undetermined significance 08/26/2022   History of PID 08/18/2022   Rectal abscess supralevator s/p I&D 03/14/2022 03/14/2022   Rectal pain    UTI (urinary tract infection) 03/10/2022   Rectal bleeding    Anal fissure    Abnormal CT scan, colon    Proctocolitis 03/09/2022   Menorrhagia with  irregular cycle 11/18/2021   Dysmenorrhea 11/18/2021   Chronic anemia 11/17/2021   Lower abdominal pain 11/07/2021   Moderate episode of recurrent major depressive disorder (HCC) 06/23/2021   Low Gonzalez pain 05/02/2021   Headache 09/03/2020   Nonspecific abdominal pain 08/30/2020   Ganglion cyst of dorsum of left wrist 11/03/2019   Constipation, chronic 01/21/2018   Irregular periods 01/21/2018   Generalized anxiety disorder 01/21/2018    Past Medical History:  Diagnosis Date   Anemia    Anxiety    Arthritis    knees   Chlamydia 11/13/2021   Constipation, chronic    Depression    Dyspnea    ON OCC   Environmental allergies    Ganglion cyst of dorsum of left wrist    RECURRENT   GERD (gastroesophageal reflux disease)    watches diet   Headache MIGRAINE    History of hypertension    IN PAST, NO RECENT HTN PER PT 01-19-2023   IBS (irritable bowel syndrome)    Lactose intolerance    PCOS  (polycystic ovarian syndrome)    Pre-diabetes     Family History  Problem Relation Age of Onset   Heart failure Mother    Migraines Mother    Irritable bowel syndrome Mother    Other Mother 28       Tricuspid valve regurgitation   Asthma Father    Ovarian cancer Maternal Aunt    Graves' disease Paternal Aunt    Hypertension Maternal Grandmother    Diabetes Maternal Grandmother    Hypertension Maternal Grandfather    Diabetes Maternal Grandfather    Hypertension Paternal Grandmother    Stroke Paternal Grandmother    Diabetes Paternal Grandmother    Hypertension Paternal Grandfather    Diabetes Paternal Grandfather    Kidney disease Paternal Grandfather    Stomach cancer Neg Hx    Esophageal cancer Neg Hx    Colon cancer Neg Hx    Past Surgical History:  Procedure Laterality Date   GANGLION CYST EXCISION Left 04/24/2021   Procedure: left recurrent dorsal carpal ganglion excision;  Surgeon: Alyse Agent, MD;  Location: [facility];  Service: Orthopedics;  Laterality: Left;   INCISION AND DRAINAGE PERIRECTAL ABSCESS N/A 03/14/2022   Procedure: IRRIGATION AND DEBRIDEMENT PERIRECTAL ABSCESS;  Surgeon: Sheldon Standing, MD;  Location: [facility];  Service: General;  Laterality: N/A;   LAPAROSCOPY N/A 02/10/2023   Procedure: LAPAROSCOPY DIAGNOSTIC;  Surgeon: Izell Harari, MD;  Location: [facility];  Service: Gynecology;  Laterality: N/A;   TONSILLECTOMY AND ADENOIDECTOMY     AGE 56   WISDOM TOOTH EXTRACTION  03/18/2021   WRIST GANGLION EXCISION Left 02/2020   Social History   Social History Narrative   Not on file   Immunization History  Administered Date(s) Administered   HPV 9-valent 02/19/2012, 12/19/2012, 05/01/2013   Influenza,inj,Quad PF,6+ Mos 06/15/2018   Meningococcal Mcv4o 11/21/2018   Moderna Sars-Covid-2 Vaccination 11/22/2019, 12/20/2019     Objective: Vital Signs: BP 116/73 (BP Location: Right Arm, Patient Position: Sitting, Cuff  Size: Normal)   Pulse 82   Resp 16   Ht 5' 8 (1.727 m)   Wt 274 lb (124.3 kg)   LMP  (LMP Unknown)   BMI 41.66 kg/m    Physical Exam Constitutional:      Appearance: She is obese.  Eyes:     Conjunctiva/sclera: Conjunctivae normal.  Cardiovascular:     Rate and Rhythm: Normal rate and regular rhythm.  Pulmonary:  Effort: Pulmonary effort is normal.     Breath sounds: Normal breath sounds.  Lymphadenopathy:     Cervical: No cervical adenopathy.  Skin:    General: Skin is warm and dry.     Comments: Acanthosis Well demarcated hypopigmented patch of skin intergluteal cleft, no erythema or induration  Neurological:     Mental Status: She is alert.  Psychiatric:        Mood and Affect: Mood normal.      Musculoskeletal Exam:  Shoulders full ROM no tenderness or swelling Widespread tenderness to pressure throughout upper and lower Gonzalez Elbows full ROM no tenderness or swelling Left wrist cyst, no palpable swelling, full ROM Fingers full ROM no tenderness or swelling Knees full ROM, tenderness to pressure at patellar tendon, no palpable effusion Ankles full ROM tenderness to pressure and with movement, no palpable effusion   Investigation: No additional findings.  Imaging: No results found.  Recent Labs: Lab Results  Component Value Date   WBC 6.0 02/01/2024   HGB 10.5 (L) 02/01/2024   PLT 281.0 02/01/2024   NA 137 02/01/2024   K 3.6 02/01/2024   CL 103 02/01/2024   CO2 28 02/01/2024   GLUCOSE 91 02/01/2024   BUN 8 02/01/2024   CREATININE 0.68 02/01/2024   BILITOT 0.3 02/01/2024   ALKPHOS 86 02/01/2024   AST 25 02/01/2024   ALT 34 02/01/2024   PROT 7.9 02/01/2024   ALBUMIN 4.2 02/01/2024   CALCIUM 9.3 02/01/2024   GFRAA 147 08/28/2020    Speciality Comments: No specialty comments available.  Procedures:  No procedures performed Allergies: Blue dyes (parenteral), Blueberry [vaccinium angustifolium], Raspberry, Shellfish allergy, Amitriptyline ,  and Sumatriptan    Assessment / Plan:     Visit Diagnoses: Seronegative inflammatory arthritis - Plan: Sedimentation rate, C3 and C4, RNP Antibody Persistent joint pain despite hydroxychloroquine . Previous dose adjustment ineffective. - Recheck blood test for inflammation markers and hydroxychloroquine  efficacy. - Discontinue hydroxychloroquine  if no improvement.  Patellar tendinitis of bilateral knees Chronic bilateral knee pain, worsened by activity, consistent with patellar tendinitis. Previous physical therapy not tolerated. - Refer to physical therapy for quadriceps strengthening.  Abdominal pain with rectal bleeding Worsening abdominal pain with bright red rectal bleeding. Possible anal fissure. Exam benign there is a small area of possibly vitiligo or similar hypopigmentation in one area - Monitor symptoms, consider further evaluation if persistent or worsening.        Orders: Orders Placed This Encounter  Procedures   Sedimentation rate   C3 and C4   RNP Antibody   No orders of the defined types were placed in this encounter.    Follow-Up Instructions: Return in about 3 months (around 06/09/2024) for ?UCTD HCQ stop f/u 3mos.   Lonni LELON Ester, MD  Note - This record has been created using [employer].  Chart creation errors have been sought, but may not always  have been located. Such creation errors do not reflect on  the standard of medical care.

## 2024-03-09 ENCOUNTER — Encounter: Payer: Self-pay | Admitting: Internal Medicine

## 2024-03-09 ENCOUNTER — Ambulatory Visit: Attending: Internal Medicine | Admitting: Internal Medicine

## 2024-03-09 VITALS — BP 116/73 | HR 82 | Resp 16 | Ht 68.0 in | Wt 274.0 lb

## 2024-03-09 DIAGNOSIS — M1380 Other specified arthritis, unspecified site: Secondary | ICD-10-CM | POA: Diagnosis present

## 2024-03-09 DIAGNOSIS — M797 Fibromyalgia: Secondary | ICD-10-CM | POA: Diagnosis present

## 2024-03-09 DIAGNOSIS — E559 Vitamin D deficiency, unspecified: Secondary | ICD-10-CM | POA: Diagnosis present

## 2024-03-09 DIAGNOSIS — Z79899 Other long term (current) drug therapy: Secondary | ICD-10-CM | POA: Diagnosis present

## 2024-03-09 DIAGNOSIS — M2550 Pain in unspecified joint: Secondary | ICD-10-CM | POA: Insufficient documentation

## 2024-03-11 LAB — C3 AND C4
C3 Complement: 197 mg/dL — ABNORMAL HIGH (ref 83–193)
C4 Complement: 34 mg/dL (ref 15–57)

## 2024-03-11 LAB — SEDIMENTATION RATE: Sed Rate: 22 mm/h — ABNORMAL HIGH (ref 0–20)

## 2024-03-11 LAB — RNP ANTIBODY: Ribonucleic Protein(ENA) Antibody, IgG: 2 AI — AB

## 2024-03-28 ENCOUNTER — Encounter: Payer: Self-pay | Admitting: Neurology

## 2024-03-28 ENCOUNTER — Ambulatory Visit: Admitting: Neurology

## 2024-03-28 VITALS — BP 115/88 | HR 88 | Ht 68.0 in | Wt 273.0 lb

## 2024-03-28 DIAGNOSIS — G9332 Myalgic encephalomyelitis/chronic fatigue syndrome: Secondary | ICD-10-CM

## 2024-03-28 DIAGNOSIS — F5113 Hypersomnia due to other mental disorder: Secondary | ICD-10-CM | POA: Diagnosis not present

## 2024-03-28 DIAGNOSIS — G8929 Other chronic pain: Secondary | ICD-10-CM | POA: Diagnosis not present

## 2024-03-28 DIAGNOSIS — G4733 Obstructive sleep apnea (adult) (pediatric): Secondary | ICD-10-CM | POA: Insufficient documentation

## 2024-03-28 DIAGNOSIS — M2550 Pain in unspecified joint: Secondary | ICD-10-CM

## 2024-03-28 DIAGNOSIS — G4726 Circadian rhythm sleep disorder, shift work type: Secondary | ICD-10-CM | POA: Insufficient documentation

## 2024-03-28 DIAGNOSIS — G4701 Insomnia due to medical condition: Secondary | ICD-10-CM

## 2024-03-28 MED ORDER — TRAZODONE HCL 50 MG PO TABS: 50.0000 mg | ORAL_TABLET | Freq: Every day | ORAL | 5 refills | Status: DC

## 2024-03-28 NOTE — Progress Notes (Signed)
 Provider:  Dedra Gores, MD  Primary Care Physician:  Tann, Samandra, NP [phone] [place] [address]     Referring Provider: Macdonald Ping, Np [address],  [place] [address]          Chief Complaint according to patient   Patient presents with:          States that she has been struggling with using the machine. Originally had nasal mask and found that full face mask is working better. There are still times where she feels like she is not getting enough air. DME [employer] , chronic insomnia,  shift work, average CPAP use time 2 hours       HISTORY OF PRESENT ILLNESS:  Jacqueline Gonzalez is a 23 y.o. female patient who is here for revisit 03/28/2024 for  CPAP compliance.   The compliance is poor, in spite of a different mask  ( now FFM )being tried and open settings from 5-20 cm water pressure.  .    Chief concern according to patient :  I  am doing a bit better but forgot the CPAP at home when I travelled recently  I am now working from home , I work from 8 PM and through 8 AM,  nights but from home, I still have always a headache or eye pain. Daytime sleep is only 4-5 hours long.  When I saw Mrs. Rosenbach the first time she had been referred upon her rheumatologist recommendations, Dr. Lonni Ester.  This was on 11/04/2023.  The patient had multiple regional pain syndromes, she has a primary neurologist Dr. Skeet, and [place] [facility] [place] [facility].  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  Her BMI was 40.8 last time we met and is now 41.5.  Neck circumference 16.5 inches.  The patient was able to collect 6 hours of sleep time on her home sleep test device with 22.7% REM sleep.  And she had very mild apnea her AHI was only 7.0 and while on CPAP  this has been decreased to less than 1 it is still I feel not worse for her to sacrifice her sleep.  So based on her experience that she is not able to incorporate the CPAP into her sleep regimen and have no benefit in terms of headache control I feel that there is a limited benefit with a lot of discomfort for this patient.    [name] listened to my dictation and is willing to give it 4 hours each night on CPAP, she has sleep aid medication at home.   We will use CPAP for another 6 months and schedule a RV with NP.  Trazodone  50 mg , 1/2 tab = 25 mg.  Weight loss is the long term best solution, she may need to speak to her FP/ PCP about  medication to support this effort.    Am Hx : see previous note  Social HX; see previous note       Review of Systems: Out of a complete 14 system review, the patient complains of only the following symptoms, and all other reviewed systems are negative.:   SLEEPINESS ?  How likely are you to doze in the  following situations: 0 = not likely, 1 = slight chance, 2 = moderate chance, 3 = high chance  Sitting and Reading? Watching Television? Sitting inactive in a public place (theater or meeting)? Lying down in the afternoon when circumstances permit? Sitting and talking to someone? Sitting quietly after lunch without alcohol? In a car, while stopped for a few minutes in traffic? As a passenger in a car for an hour without a break?  Total =        Social History   Socioeconomic History   Marital status: Single    Spouse name: Not on file   Number of children: 0   Years of education: Not on file   Highest education level: Not on file  Occupational History   Occupation: student  Tobacco Use   Smoking status: Never    Passive exposure: Past   Smokeless tobacco: Never  Vaping Use   Vaping status: Never Used  Substance and Sexual Activity   Alcohol use: Yes    Comment: occ   Drug use: Never    Comment: Smokes CBD occ   Sexual  activity: Yes    Birth control/protection: OCP  Other Topics Concern   Not on file  Social History Narrative   Not on file   Social Drivers of Health   Financial Resource Strain: Not on file  Food Insecurity: Food Insecurity Present (10/21/2023)   Hunger Vital Sign    Worried About Running Out of Food in the Last Year: Often true    Ran Out of Food in the Last Year: Often true  Transportation Needs: Unmet Transportation Needs (10/21/2023)   PRAPARE - Transportation    Lack of Transportation (Medical): Yes    Lack of Transportation (Non-Medical): Yes  Physical Activity: Not on file  Stress: No Stress Concern Present (07/04/2021)   [employer] of Occupational Health - Occupational Stress Questionnaire    Feeling of Stress : Only a little  Recent Concern: Stress - Stress Concern Present (06/20/2021)   [employer] of Occupational Health - Occupational Stress Questionnaire    Feeling of Stress : To some extent  Social Connections: Unknown (03/31/2023)   Received from [facility]   Social Network    Social Network: Not on file    Family History  Problem Relation Age of Onset   Heart failure Mother    Migraines Mother    Irritable bowel syndrome Mother    Other Mother 5       Tricuspid valve regurgitation   Asthma Father    Ovarian cancer Maternal Aunt    Graves' disease Paternal Aunt    Hypertension Maternal Grandmother    Diabetes Maternal Grandmother    Hypertension Maternal Grandfather    Diabetes Maternal Grandfather    Hypertension Paternal Grandmother    Stroke Paternal Grandmother    Diabetes Paternal Grandmother    Hypertension Paternal Grandfather    Diabetes Paternal Grandfather    Kidney disease Paternal Grandfather    Stomach cancer Neg Hx    Esophageal cancer Neg Hx    Colon cancer Neg Hx     Past Medical History:  Diagnosis Date   Anemia    Anxiety    Arthritis    knees   Chlamydia 11/13/2021   Constipation, chronic    Depression     Dyspnea    ON OCC   Environmental allergies    Ganglion cyst of dorsum of left wrist    RECURRENT   GERD (gastroesophageal reflux  disease)    watches diet   Headache MIGRAINE    History of hypertension    IN PAST, NO RECENT HTN PER PT 01-19-2023   IBS (irritable bowel syndrome)    Lactose intolerance    PCOS (polycystic ovarian syndrome)    Pre-diabetes     Past Surgical History:  Procedure Laterality Date   GANGLION CYST EXCISION Left 04/24/2021   Procedure: left recurrent dorsal carpal ganglion excision;  Surgeon: Alyse Agent, MD;  Location: [facility];  Service: Orthopedics;  Laterality: Left;   INCISION AND DRAINAGE PERIRECTAL ABSCESS N/A 03/14/2022   Procedure: IRRIGATION AND DEBRIDEMENT PERIRECTAL ABSCESS;  Surgeon: Sheldon Standing, MD;  Location: [facility];  Service: General;  Laterality: N/A;   LAPAROSCOPY N/A 02/10/2023   Procedure: LAPAROSCOPY DIAGNOSTIC;  Surgeon: Izell Harari, MD;  Location: [facility];  Service: Gynecology;  Laterality: N/A;   TONSILLECTOMY AND ADENOIDECTOMY     AGE 45   WISDOM TOOTH EXTRACTION  03/18/2021   WRIST GANGLION EXCISION Left 02/2020     Current Outpatient Medications on File Prior to Visit  Medication Sig Dispense Refill   buPROPion (WELLBUTRIN XL) 150 MG 24 hr tablet Take by mouth.     cetirizine  (ZYRTEC ) 10 MG tablet Take 1 tablet (10 mg total) by mouth daily. 30 tablet 0   cyclobenzaprine  (FLEXERIL ) 10 MG tablet Take 1 tablet (10 mg total) by mouth at bedtime. 30 tablet 2   DULoxetine  (CYMBALTA ) 30 MG capsule Take 30 mg by mouth every morning.     famotidine  (PEPCID ) 40 MG tablet TAKE 1 TABLET BY MOUTH EVERYDAY AT BEDTIME 90 tablet 3   hydroxychloroquine  (PLAQUENIL ) 200 MG tablet Take 2 tablets (400 mg total) by mouth daily. 180 tablet 0   hydrOXYzine  (VISTARIL ) 25 MG capsule Take 25 mg by mouth 2 (two) times daily as needed.     ibuprofen  (ADVIL ) 600 MG tablet Take 1 tablet (600 mg total) by mouth every 6  (six) hours as needed. 30 tablet 0   lidocaine  (XYLOCAINE ) 5 % ointment Apply 1 Application topically as needed. 35 g 0   omeprazole  (PRILOSEC) 20 MG capsule Take 1 capsule (20 mg total) by mouth 2 (two) times daily before a meal.     ondansetron  (ZOFRAN ) 4 MG tablet Take 4 mg by mouth 2 (two) times daily as needed.     promethazine  (PHENERGAN ) 25 MG tablet Take 25 mg by mouth every 4 (four) hours.     Psyllium (METAMUCIL) 48.57 % POWD 1 spoonful daily for the next 14 days     SYMBICORT 80-4.5 MCG/ACT inhaler Inhale 2 puffs into the lungs 2 (two) times daily.     Vitamin D , Ergocalciferol , (DRISDOL) 1.25 MG (50000 UNIT) CAPS capsule Take 50,000 Units by mouth every 7 (seven) days.     VRAYLAR 1.5 MG capsule Take 1.5 mg by mouth daily.     ACCRUFER 30 MG CAPS TAKE 1 CAPSULE BY MOUTH TWICE A DAY BEFORE MEALS FOR ANEMIA.     albuterol  (VENTOLIN  HFA) 108 (90 Base) MCG/ACT inhaler Inhale into the lungs every 6 (six) hours as needed for wheezing or shortness of breath.     Drospirenone  (SLYND ) 4 MG TABS Take 1 tablet (4 mg total) by mouth daily. (Patient not taking: Reported on 03/28/2024) 90 tablet 3   fluticasone  (FLONASE ) 50 MCG/ACT nasal spray Place 2 sprays into both nostrils daily. 16 g 0   medroxyPROGESTERone  (PROVERA ) 10 MG tablet Take 1 tablet (10 mg total)  by mouth daily. Use for ten days (Patient not taking: Reported on 03/28/2024) 10 tablet 0   propranolol (INDERAL) 60 MG tablet Take 60 mg by mouth 2 (two) times daily. (Patient not taking: Reported on 03/09/2024)     No current facility-administered medications on file prior to visit.    Allergies  Allergen Reactions   Blue Dyes (Parenteral) Anaphylaxis   Blueberry [Vaccinium Angustifolium] Anaphylaxis   Raspberry Anaphylaxis   Shellfish Allergy Anaphylaxis    ALL SHELLFISH   Amitriptyline  Other (See Comments)    Fast heart rate and throat closing   Sumatriptan  Swelling    Throat swelling/Increased HR per pt     DIAGNOSTIC DATA  (LABS, IMAGING, TESTING) - I reviewed patient records, labs, notes, testing and imaging myself where available.  Lab Results  Component Value Date   WBC 6.0 02/01/2024   HGB 10.5 (L) 02/01/2024   HCT 33.2 (L) 02/01/2024   MCV 77.6 (L) 02/01/2024   PLT 281.0 02/01/2024      Component Value Date/Time   NA 137 02/01/2024 1617   NA 138 08/28/2020 1600   K 3.6 02/01/2024 1617   CL 103 02/01/2024 1617   CO2 28 02/01/2024 1617   GLUCOSE 91 02/01/2024 1617   BUN 8 02/01/2024 1617   BUN 6 08/28/2020 1600   CREATININE 0.68 02/01/2024 1617   CALCIUM 9.3 02/01/2024 1617   PROT 7.9 02/01/2024 1617   PROT 7.6 08/28/2020 1600   ALBUMIN 4.2 02/01/2024 1617   ALBUMIN 4.3 08/28/2020 1600   AST 25 02/01/2024 1617   ALT 34 02/01/2024 1617   ALKPHOS 86 02/01/2024 1617   BILITOT 0.3 02/01/2024 1617   BILITOT 0.2 08/28/2020 1600   GFRNONAA >60 09/08/2023 0830   GFRAA 147 08/28/2020 1600   No results found for: CHOL, HDL, LDLCALC, LDLDIRECT, TRIG, CHOLHDL Lab Results  Component Value Date   HGBA1C 5.1 11/27/2021   Lab Results  Component Value Date   VITAMINB12 277 03/14/2022   Lab Results  Component Value Date   TSH 1.540 08/24/2023    PHYSICAL EXAM:  Vitals:   03/28/24 1540  BP: 115/88  Pulse: 88   No data found. Body mass index is 41.51 kg/m.   Wt Readings from Last 3 Encounters:  03/28/24 273 lb (123.8 kg)  03/09/24 274 lb (124.3 kg)  02/01/24 273 lb (123.8 kg)     Ht Readings from Last 3 Encounters:  03/28/24 5' 8 (1.727 m)  03/09/24 5' 8 (1.727 m)  02/01/24 5' 8 (1.727 m)      General: The patient is awake, alert and appears not in acute distress and groomed. Head: Normocephalic, atraumatic.  Neck is supple. Mallampati 3 plus ,  neck circumference:16.5  inches . Nasal airflow fully  patent.  Retrognathia is  seen.  Dental status: biological  Cardiovascular:  Regular rate and cardiac rhythm by pulse,  without distended neck veins. Respiratory:  Lungs are clear to auscultation.  Reports chest pain.  Skin:  Without evidence of ankle edema, or rash. Trunk: The patient's posture is stooped due to the large breasts .    NEUROLOGIC EXAM: The patient is awake and alert, oriented to place and time.   Memory subjective described as intact.  Attention span & concentration ability appears normal.  Speech is fluent,  without  dysarthria, dysphonia or aphasia.  Mood and affect are appropriate.   Cranial nerves: no loss of smell or taste reported  Pupils are equal and briskly reactive to light.  Funduscopic exam deferred .  Extraocular movements in vertical and horizontal planes were intact and without nystagmus. No Diplopia. Visual fields by finger perimetry are intact. Hearing was intact to soft voice and finger rubbing.   Facial sensation intact to fine touch. Facial motor strength is symmetric and tongue and uvula move midline.  Neck ROM : rotation, tilt and flexion extension were normal for age and shoulder shrug was symmetrical.  She reports lightheadedness with neck flexion and upward gaze .   Motor exam:  Symmetric bulk, tone and ROM.   Normal tone without cog- wheeling, symmetric grip strength .  ASSESSMENT AND PLAN :   23 y.o. year old female  here with:  Mild OSA : HST confirmed the presence of AHI 7/h ,  on CPAP  with 50% compliance, has switched to FFM   ,  Also with persistent  excessive daytime sleepiness and  fatigue, with a BMI of  41.5,  EDS : 17/ 24  points  FSS at 56/ 63 points   Ms Jerilynn listened to my dictation and is willing to give it 4 hours each night on CPAP, she has sleep aid medication at home.   We will use CPAP for another 6 months and schedule a RV with NP.  Trazodone  50 mg , 1/2 tab = 25 mg.   Weight loss is the long term best solution, she may need to speak to her FP/ PCP about  medication to support this effort.      1)  keep another 6 months of CPAP to try,your AHI is much reduced and  longer  use will give you more reliable  effect on sleepiness and fatigue.   2) Better sleep hygiene, allow 5 hours minimum sleep time and sue of CPAP nightly.  3) Trazodone  25 - 50 mg to help stay asleep.  4) patient has discontinued her cymbalta  about 4 weeks ago.   5) establish good sleep wake routines.      I would like to thank  Dr Juliene Dunnings , DO , and Tann, Samandra, Np [phone] [place],  [place] [address] for allowing me to meet with this pleasant patient.   Sleep Clinic Patients are generally offered input on sleep hygiene, life style changes and how to improve compliance with medical treatment where applicable. Review and reiteration of good sleep hygiene measures is offered to any sleep clinic patient, be it in the first consultation or with any follow up visits. Any patient with sleepiness should be cautioned not to drive, work at heights, or operate dangerous or heavy equipment when feeling tired or sleepy.    The patient will be seen in follow-up in the sleep clinic at [facility] for discussion of test results, sleep related symptoms and treatment compliance review, further management strategies, etc.   The referring provider will be notified of the test results.   The patient's condition requires frequent monitoring and adjustments in the treatment plan, reflecting the ongoing complexity of care.  This provider is the continuing focal point for all needed services for this condition.  After spending a total time of  30  minutes face to face and time for  history taking, physical and neurologic examination, review of laboratory studies,  personal review of imaging studies, reports and results of other testing and review of referral information / records as far as provided in visit,   Electronically signed by: Dedra Gores, MD 03/28/2024 3:45 PM  [facility]  Board certified by [employer] of Sleep Medicine and Diplomate of the [employer] of Sleep Medicine. Board certified In Neurology through the ABPN, Fellow of the [employer] of Neurology.

## 2024-03-28 NOTE — Patient Instructions (Signed)
 Living With Sleep Apnea Sleep apnea is a condition that affects your breathing while you're sleeping. Your tongue or the tissue in your throat may block the flow of air while you sleep. You may have shallow breathing or stop breathing for short periods of time. The breaks in breathing interrupt the deep sleep that you need to feel rested. Even if you don't wake up from the gaps in breathing, you may feel tired during the day. People with sleep apnea may snore loudly. You may have a headache in the morning and feel anxious or depressed. How can sleep apnea affect me? Sleep apnea increases your chances of being very tired during the day. This is called daytime fatigue. Sleep apnea can also increase your risk of: Heart attack. Stroke. Obesity. Type 2 diabetes. Heart failure. Irregular heartbeat. High blood pressure. If you are very tired during the day, you may be more likely to: Not do well in school or at work. Fall asleep while driving. Have trouble paying attention. Develop depression or anxiety. Have problems having sex. This is called sexual dysfunction. What actions can I take to manage sleep apnea? Sleep apnea treatment  If you were given a device to open your airway while you sleep, use it only as told by your health care provider. You may be given: An oral appliance. This is a mouthpiece that shifts your lower jaw forward. A continuous positive airway pressure (CPAP) device. This blows air through a mask. A nasal expiratory positive airway pressure (EPAP) device. This has valves that you put into each nostril. A bi-level positive airway pressure (BIPAP) device. This blows air through a mask when you breathe in and breathe out. You may need surgery if other treatments don't work for you. Sleep habits Go to sleep and wake up at the same time every day. This helps set your internal clock for sleeping. If you stay up later than usual on weekends, try to get up in the morning within 2  hours of the time you usually wake up. Try to get at least 7-9 hours of sleep each night. Stop using a computer, tablet, and mobile phone a few hours before bedtime. Do not take long naps during the day. If you nap, limit it to 30 minutes. Have a relaxing bedtime routine. Reading or listening to music may relax you and help you sleep. Use your bedroom only for sleep. Keep your television and computer out of your bedroom. Keep your bedroom cool, dark, and quiet. Use a supportive mattress and pillows. Follow your provider's instructions for other changes to sleep habits. Nutrition Do not eat big meals in the evening. Do not have caffeine  in the later part of the day. The effects of caffeine  can last for more than 5 hours. Follow your provider's instructions for any changes to what you eat and drink. Lifestyle Do not drink alcohol before bedtime. Alcohol can cause you to fall asleep at first, but then it can cause you to wake up in the middle of the night and have trouble getting back to sleep. Do not smoke, vape, or use nicotine or tobacco. Medicines Take over-the-counter and prescription medicines only as told by your provider. Do not use over-the-counter sleep medicine. You may become dependent on this medicine, and it can make sleep apnea worse. Do not take medicines, such as sedatives and narcotics, unless told to by your provider. Activity Exercise on most days, but avoid exercising in the evening. Exercising near bedtime can interfere with sleeping.  If possible, spend time outside every day. Natural light helps with your internal clock. General information Lose weight if you need to. Stay at a healthy weight. If you are having surgery, make sure to tell your provider that you have sleep apnea. You may need to bring your device with you. Keep all follow-up visits. Your provider will want to check on your condition. Where to find more information National [employer], [employer], [employer]  [employer]: [employer] This information is not intended to replace advice given to you by your health care provider. Make sure you discuss any questions you have with your health care provider. Document Revised: 10/28/2022 Document Reviewed: 10/28/2022 Elsevier Patient Education  2024 [employer] Fatigue If you have fatigue, you feel tired all the time and have a lack of energy or a lack of motivation. Fatigue may make it difficult to start or complete tasks because of exhaustion. Occasional or mild fatigue is often a normal response to activity or life. However, long-term (chronic) or extreme fatigue may be a symptom of a medical condition such as: Depression. Not having enough red blood cells or hemoglobin in the blood (anemia). A problem with a small gland located in the lower front part of the neck (thyroid disorder). Rheumatologic conditions. These are problems related to the body's defense system (immune system). Infections, especially certain viral infections. Fatigue can also lead to negative health outcomes over time. Follow these instructions at home: Medicines Take over-the-counter and prescription medicines only as told by your health care provider. Take a multivitamin if told by your health care provider. Do not use herbal or dietary supplements unless they are approved by your health care provider. Eating and drinking  Avoid heavy meals in the evening. Eat a well-balanced diet, which includes lean proteins, whole grains, plenty of fruits and vegetables, and low-fat dairy products. Avoid eating or drinking too many products with caffeine  in them. Avoid alcohol. Drink enough fluid to keep your urine pale yellow. Activity  Exercise regularly, as told by your health care provider. Use or practice techniques to help you relax, such as yoga, tai chi, meditation, or massage therapy. Lifestyle Change situations that cause you stress. Try to keep your work and personal  schedules in balance. Do not use recreational or illegal drugs. General instructions Monitor your fatigue for any changes. Go to bed and get up at the same time every day. Avoid fatigue by pacing yourself during the day and getting enough sleep at night. Maintain a healthy weight. Contact a health care provider if: Your fatigue does not get better. You have a fever. You suddenly lose or gain weight. You have headaches. You have trouble falling asleep or sleeping through the night. You feel angry, guilty, anxious, or sad. You have swelling in your legs or another part of your body. Get help right away if: You feel confused, feel like you might faint, or faint. Your vision is blurry or you have a severe headache. You have severe pain in your abdomen, your back, or the area between your waist and hips (pelvis). You have chest pain, shortness of breath, or an irregular or fast heartbeat. You are unable to urinate, or you urinate less than normal. You have abnormal bleeding from the rectum, nose, lungs, nipples, or, if you are female, the vagina. You vomit blood. You have thoughts about hurting yourself or others. These symptoms may be an emergency. Get help right away. Call 911. Do not wait to see if the symptoms will go  away. Do not drive yourself to the hospital. Get help right away if you feel like you may hurt yourself or others, or have thoughts about taking your own life. Go to your nearest emergency room or: Call 911. Call the National Suicide Prevention Lifeline at (704) 122-5550 or 988. This is open 24 hours a day. Text the Crisis Text Line at 662-352-9247. Summary If you have fatigue, you feel tired all the time and have a lack of energy or a lack of motivation. Fatigue may make it difficult to start or complete tasks because of exhaustion. Long-term (chronic) or extreme fatigue may be a symptom of a medical condition. Exercise regularly, as told by your health care  provider. Change situations that cause you stress. Try to keep your work and personal schedules in balance. This information is not intended to replace advice given to you by your health care provider. Make sure you discuss any questions you have with your health care provider. Document Revised: 04/28/2021 Document Reviewed: 04/28/2021 Elsevier Patient Education  2024 [employer] Insomnia Insomnia is a sleep disorder that makes it difficult to fall asleep or stay asleep. Insomnia can cause fatigue, low energy, difficulty concentrating, mood swings, and poor performance at work or school. There are three different ways to classify insomnia: Difficulty falling asleep. Difficulty staying asleep. Waking up too early in the morning. Any type of insomnia can be long-term (chronic) or short-term (acute). Both are common. Short-term insomnia usually lasts for 3 months or less. Chronic insomnia occurs at least three times a week for longer than 3 months. What are the causes? Insomnia may be caused by another condition, situation, or substance, such as: Having certain mental health conditions, such as anxiety and depression. Using caffeine , alcohol, tobacco, or drugs. Having gastrointestinal conditions, such as gastroesophageal reflux disease (GERD). Having certain medical conditions. These include: Asthma. Alzheimer's disease. Stroke. Chronic pain. An overactive thyroid gland (hyperthyroidism). Other sleep disorders, such as restless legs syndrome and sleep apnea. Menopause. Sometimes, the cause of insomnia may not be known. What increases the risk? Risk factors for insomnia include: Gender. Females are affected more often than males. Age. Insomnia is more common as people get older. Stress and certain medical and mental health conditions. Lack of exercise. Having an irregular work schedule. This may include working night shifts and traveling between different time zones. What are the  signs or symptoms? If you have insomnia, the main symptom is having trouble falling asleep or having trouble staying asleep. This may lead to other symptoms, such as: Feeling tired or having low energy. Feeling nervous about going to sleep. Not feeling rested in the morning. Having trouble concentrating. Feeling irritable, anxious, or depressed. How is this diagnosed? This condition may be diagnosed based on: Your symptoms and medical history. Your health care provider may ask about: Your sleep habits. Any medical conditions you have. Your mental health. A physical exam. How is this treated? Treatment for insomnia depends on the cause. Treatment may focus on treating an underlying condition that is causing the insomnia. Treatment may also include: Medicines to help you sleep. Counseling or therapy. Lifestyle adjustments to help you sleep better. Follow these instructions at home: Eating and drinking  Limit or avoid alcohol, caffeinated beverages, and products that contain nicotine and tobacco, especially close to bedtime. These can disrupt your sleep. Do not eat a large meal or eat spicy foods right before bedtime. This can lead to digestive discomfort that can make it hard for you to sleep.  Sleep habits  Keep a sleep diary to help you and your health care provider figure out what could be causing your insomnia. Write down: When you sleep. When you wake up during the night. How well you sleep and how rested you feel the next day. Any side effects of medicines you are taking. What you eat and drink. Make your bedroom a dark, comfortable place where it is easy to fall asleep. Put up shades or blackout curtains to block light from outside. Use a white noise machine to block noise. Keep the temperature cool. Limit screen use before bedtime. This includes: Not watching TV. Not using your smartphone, tablet, or computer. Stick to a routine that includes going to bed and waking up at  the same times every day and night. This can help you fall asleep faster. Consider making a quiet activity, such as reading, part of your nighttime routine. Try to avoid taking naps during the day so that you sleep better at night. Get out of bed if you are still awake after 15 minutes of trying to sleep. Keep the lights down, but try reading or doing a quiet activity. When you feel sleepy, go back to bed. General instructions Take over-the-counter and prescription medicines only as told by your health care provider. Exercise regularly as told by your health care provider. However, avoid exercising in the hours right before bedtime. Use relaxation techniques to manage stress. Ask your health care provider to suggest some techniques that may work well for you. These may include: Breathing exercises. Routines to release muscle tension. Visualizing peaceful scenes. Make sure that you drive carefully. Do not drive if you feel very sleepy. Keep all follow-up visits. This is important. Contact a health care provider if: You are tired throughout the day. You have trouble in your daily routine due to sleepiness. You continue to have sleep problems, or your sleep problems get worse. Get help right away if: You have thoughts about hurting yourself or someone else. Get help right away if you feel like you may hurt yourself or others, or have thoughts about taking your own life. Go to your nearest emergency room or: Call 911. Call the National Suicide Prevention Lifeline at (701) 765-7135 or 988. This is open 24 hours a day. Text the Crisis Text Line at 516-479-1395. Summary Insomnia is a sleep disorder that makes it difficult to fall asleep or stay asleep. Insomnia can be long-term (chronic) or short-term (acute). Treatment for insomnia depends on the cause. Treatment may focus on treating an underlying condition that is causing the insomnia. Keep a sleep diary to help you and your health care provider  figure out what could be causing your insomnia. This information is not intended to replace advice given to you by your health care provider. Make sure you discuss any questions you have with your health care provider. Document Revised: 06/16/2021 Document Reviewed: 06/16/2021 Elsevier Patient Education  2024 [employer].

## 2024-04-20 ENCOUNTER — Other Ambulatory Visit: Payer: Self-pay | Admitting: Neurology

## 2024-05-08 ENCOUNTER — Ambulatory Visit: Admitting: Gastroenterology

## 2024-05-20 NOTE — Progress Notes (Deleted)
 Office Visit Note  Patient: Jacqueline Gonzalez             Date of Birth: 11/12/2000           MRN: [id number]             PCP: Macdonald Ping, NP Referring: Macdonald Ping, NP Visit Date: 05/22/2024   Subjective:  No chief complaint on file.   History of Present Illness: Jacqueline Gonzalez is a 23 y.o. female here for follow up ***   Previous HPI 03/09/24 Jacqueline Gonzalez is a 23 y.o. female here for follow up  for continued joint pain at multiple areas on HCQ 400 mg daily.  She complains of worsening knee pain and abdominal pain with blood in stool.   She experiences worsening knee pain, particularly when trying to get up, walking down stairs, and driving. The pain is severe and more pronounced than her previous back pain. She has been on hydroxychloroquine , but has not noticed any improvement in her symptoms despite a change in dosage a few months ago. She also takes Cymbalta  but does not feel it helps with her joint pain.   She experiences abdominal pain intermittently and has noticed bright red blood in her stool, which she suspects might be due to a fissure. The abdominal pain is severe, and she has not mentioned any specific treatments for this issue. She confirms the presence of bright red blood in her stool and severe abdominal pain.   She mentions having had a cold a few weeks ago and attributes some of her symptoms to a mold problem in her house, which was inadequately addressed by cleaning with Lysol and spray painting over it.       Previous HPI 10/07/2023 Jacqueline Gonzalez is a 23 y.o. female here for follow up for continued joint pain at multiple areas on HCQ 400 mg daily.   She experiences persistent abdominal pain throughout the day and night. A CT scan of the abdomen on January 19th did not reveal any abnormalities, and a previous endoscopy showed no ulcers or inflammatory lesions. She was initially recommended pantoprazole  for stomach acid suppression due to concerns about  gastritis but has since switched to Mytesi and omeprazole  without significant improvement. She has not had an ultrasound to evaluate the liver or gallbladder.   She experiences nausea and diarrhea, with blood in her stool approximately every two weeks. She describes being usually constipated, with difficulty in bowel movements. She was evaluated by a gastroenterologist less than a month ago, who suggested her symptoms might be related to IBS. She was recently prescribed Cymbalta  by her psychiatrist, which she started a couple of weeks ago.   She reports joint pain and stiffness, particularly in the mornings, with numbness in her hands and tingling in her feet. She has been diagnosed with carpal tunnel syndrome in one hand. She experiences significant fatigue and difficulty performing daily activities, such as washing dishes and driving. She reports poor sleep, getting only about four hours per night, and sometimes sleeping excessively during the day. She has not had a sleep study or seen a neurologist for these issues.   She mentions a history of rashes, which have been minor recently. She uses a cream, possibly triamcinolone, for her skin. She also takes gabapentin  but has not used muscle relaxers like Flexeril . She recalls a possible flu-like illness since her last visit but no major infections. She has not had any recent exposure to ticks or been tested for Lyme  disease.    Previous HPI 07/09/2023 Jacqueline Gonzalez is a 23 y.o. female here for follow up for continued joint pain at multiple areas currently having trouble especially in her wrist and ankles.  She reports no significant improvement despite the initiation of Plaquenil  200 mg daily. They describe a new onset of skin issues, characterized by abnormal healing of scars and easy bruising, particularly after blood draws. The patient also reports constant pain, which is severe enough to consider daily hospital visits, but denies any specific  aggravating factors. Over-the-counter pain medications, such as ibuprofen  and Tylenol , are currently being used for pain management.   The patient was recently seen by a gynecologist, who recommended a gastroenterology consult, scheduled for February. They also report a recent bout of bronchitis, which was treated with prednisone  and antibiotics. During this time, they also had a tooth infection, which was managed by extraction. The patient did not notice any significant changes while on the short course of steroids.   The patient also reports occasional blood in their stool, which varies from bright red to black. The patient also has a history of eczema, which has been resistant to previous treatments. They deny any circulation problems in the colder weather.      Previous HPI 04/12/2023 Jacqueline Gonzalez is a 23 y.o. female here for follow up for continued joint pain at multiple areas currently having trouble especially in her wrist and ankles.  So far does not feel hydroxychloroquine  has changed her symptoms dramatically. Also still has ongoing lower abdominal and pelvic pain chronically. She had laparoscopy for this with an adhesion found but not thought clinically important. Still also has persistent diarrhea and constipation.  Has been getting some itchy rash and describes redness along the inner sides and underneath her breasts was recommended to use her topical eczema cream but not seeing any benefit so far.   Previous HPI 02/09/2023 Jacqueline Gonzalez is a 23 y.o. female here for follow up for continued joint pain at multiple areas currently having trouble especially in her wrist and ankles.  Workup at our initial visit showed a high sedimentation rate high complement C3 and elevated RNP antibody titer so some concern for underlying inflammatory arthritis process.  She is noticing more symptoms around the ganglion cyst of the left wrist and pain especially with active movement.  Ankle pain and  soreness especially with use but not noticing any specific nodules just generalized swelling.   Previous HPI 01/08/23 Jacqueline Gonzalez is a 23 y.o. female here for evaluation of positive ANA associated with joint pains and elevated sedimentation rate.  She reports longstanding history of joint pain in multiple areas going all the way back to adolescence.  However her current problems with persistent and sometimes activity limiting widespread pain has been more prominent in the past 1 year.  This is also associated with worsening chronic fatigue.  She did not recall any preceding serious infection.  She mostly describes this as being all over the but sometimes in particular has had more back and chronic pelvic pain.  Has had low back pain and radicular symptoms extending throughout the left leg up to include part of the perineum.  Also reports previous diagnosis with knee osteoarthritis.  Pain is varying in severity worse on some days than others not associated with much visible swelling or discoloration in affected areas.  Also sensitive to pressure or temperature changes in affected areas.  Previous treatments tried include gabapentin , oxycodone , and high-dose ibuprofen  but did not  find any of them highly effective. Lab testing looking into these increase symptoms in February showing positive ANA, sed rate of 40, and deficient vitamin D  at 4.6.  She was also started on 50,000 units weekly supplementation for vitamin D  but has not felt a noticeable difference. She has history of eczema has been treated with what sounds like topical emollients in dermatology office but does not currently follow-up.  Describes her skin breaking out and irritation if having prolonged sun exposure.  No specific lesions persisting mostly just the irritation.  She reports several episodes with cystic lesions in the skin under her armpits under the breasts and in the groin.  Sometimes with drainage.  No previous diagnosis of  hidradenitis suppurativa. She intermittently gets thrush possibly associated with her inhaler treatment but she does not use this consistently.  Does not get frequent oral nasal ulcers.  She denies typical Raynaud's symptoms, lymphadenopathy, or history of blood clots. Is also had more trouble with migraine headaches during this year was started on injection treatments for the past 3 months.  Also has persistent irritable bowel symptoms mixed constipation and diarrhea on Bentyl  with some benefit.  She experiences some kind of dizziness or vertiginous symptoms these come and go without a specific position or provocation.  Says sometimes last up to 1 hour at a time.   Family history includes an aunt with Graves' disease but no first-degree relative known autoimmune disease.   Labs reviewed 11/2022 TSH wnl   08/2022 ANA pos ESR 40 Vit D 4.6 RF neg CCP neg   No Rheumatology ROS completed.   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 11/04/2023   Chronic pain syndrome 11/04/2023   Insomnia secondary to chronic pain 11/04/2023   CFS (chronic fatigue syndrome) 11/04/2023   Fibromyalgia syndrome 10/07/2023   Intertrigo 04/12/2023   Seronegative inflammatory arthritis 02/09/2023   Precordial pain 01/28/2023   Positive ANA (antinuclear antibody) 01/08/2023   IBS (irritable bowel syndrome) 01/08/2023   Sedimentation rate elevation 01/08/2023   Polyarthralgia 01/08/2023   Vitamin D  deficiency 01/08/2023   PCOS (polycystic ovarian syndrome) 08/26/2022   LGSIL of cervix of undetermined significance 08/26/2022   History of PID 08/18/2022   Rectal abscess supralevator s/p I&D 03/14/2022 03/14/2022   Rectal pain    UTI (urinary tract infection) 03/10/2022   Rectal bleeding    Anal fissure     Abnormal CT scan, colon    Proctocolitis 03/09/2022   Menorrhagia with irregular cycle 11/18/2021   Dysmenorrhea 11/18/2021   Chronic anemia 11/17/2021   Lower abdominal pain 11/07/2021   Moderate episode of recurrent major depressive disorder (HCC) 06/23/2021   Low back pain 05/02/2021   Headache 09/03/2020   Nonspecific abdominal pain 08/30/2020   Ganglion cyst of dorsum of left wrist 11/03/2019   Constipation, chronic 01/21/2018   Irregular periods 01/21/2018   Generalized anxiety disorder 01/21/2018    Past Medical History:  Diagnosis Date   Anemia    Anxiety    Arthritis    knees   Chlamydia 11/13/2021   Constipation, chronic    Depression    Dyspnea    ON OCC   Environmental allergies    Ganglion cyst of dorsum of left wrist    RECURRENT   GERD (gastroesophageal reflux disease)  watches diet   Headache MIGRAINE    History of hypertension    IN PAST, NO RECENT HTN PER PT 01-19-2023   IBS (irritable bowel syndrome)    Lactose intolerance    PCOS (polycystic ovarian syndrome)    Pre-diabetes     Family History  Problem Relation Age of Onset   Heart failure Mother    Migraines Mother    Irritable bowel syndrome Mother    Other Mother 100       Tricuspid valve regurgitation   Asthma Father    Ovarian cancer Maternal Aunt    Graves' disease Paternal Aunt    Hypertension Maternal Grandmother    Diabetes Maternal Grandmother    Hypertension Maternal Grandfather    Diabetes Maternal Grandfather    Hypertension Paternal Grandmother    Stroke Paternal Grandmother    Diabetes Paternal Grandmother    Hypertension Paternal Grandfather    Diabetes Paternal Grandfather    Kidney disease Paternal Grandfather    Stomach cancer Neg Hx    Esophageal cancer Neg Hx    Colon cancer Neg Hx    Past Surgical History:  Procedure Laterality Date   GANGLION CYST EXCISION Left 04/24/2021   Procedure: left recurrent dorsal carpal ganglion excision;  Surgeon: Alyse Agent,  MD;  Location: [facility] [place];  Service: Orthopedics;  Laterality: Left;   INCISION AND DRAINAGE PERIRECTAL ABSCESS N/A 03/14/2022   Procedure: IRRIGATION AND DEBRIDEMENT PERIRECTAL ABSCESS;  Surgeon: Sheldon Standing, MD;  Location: [facility];  Service: General;  Laterality: N/A;   LAPAROSCOPY N/A 02/10/2023   Procedure: LAPAROSCOPY DIAGNOSTIC;  Surgeon: Izell Harari, MD;  Location: [facility] [place];  Service: Gynecology;  Laterality: N/A;   TONSILLECTOMY AND ADENOIDECTOMY     AGE 76   WISDOM TOOTH EXTRACTION  03/18/2021   WRIST GANGLION EXCISION Left 02/2020   Social History   Social History Narrative   Not on file   Immunization History  Administered Date(s) Administered   HPV 9-valent 02/19/2012, 12/19/2012, 05/01/2013   Influenza,inj,Quad PF,6+ Mos 06/15/2018   Meningococcal Mcv4o 11/21/2018   Moderna Sars-Covid-2 Vaccination 11/22/2019, 12/20/2019     Objective: Vital Signs: There were no vitals taken for this visit.   Physical Exam   Musculoskeletal Exam: ***  CDAI Exam: CDAI Score: -- Patient Global: --; Provider Global: -- Swollen: --; Tender: -- Joint Exam 05/22/2024   No joint exam has been documented for this visit   There is currently no information documented on the homunculus. Go to the Rheumatology activity and complete the homunculus joint exam.  Investigation: No additional findings.  Imaging: No results found.  Recent Labs: Lab Results  Component Value Date   WBC 6.0 02/01/2024   HGB 10.5 (L) 02/01/2024   PLT 281.0 02/01/2024   NA 137 02/01/2024   K 3.6 02/01/2024   CL 103 02/01/2024   CO2 28 02/01/2024   GLUCOSE 91 02/01/2024   BUN 8 02/01/2024   CREATININE 0.68 02/01/2024   BILITOT 0.3 02/01/2024   ALKPHOS 86 02/01/2024   AST 25 02/01/2024   ALT 34 02/01/2024   PROT 7.9 02/01/2024   ALBUMIN 4.2 02/01/2024   CALCIUM 9.3 02/01/2024   GFRAA 147 08/28/2020    Speciality Comments: No specialty comments  available.  Procedures:  No procedures performed Allergies: Blue dyes (parenteral), Blueberry [vaccinium angustifolium], Raspberry, Shellfish allergy, Amitriptyline , and Sumatriptan    Assessment / Plan:     Visit Diagnoses: No diagnosis found.  ***  Orders: No orders of  the defined types were placed in this encounter.  No orders of the defined types were placed in this encounter.    Follow-Up Instructions: No follow-ups on file.   Lonni LELON Ester, MD  Note - This record has been created using [employer].  Chart creation errors have been sought, but may not always  have been located. Such creation errors do not reflect on  the standard of medical care.

## 2024-05-22 ENCOUNTER — Ambulatory Visit: Admitting: Internal Medicine

## 2024-06-05 ENCOUNTER — Telehealth: Payer: Self-pay | Admitting: Neurology

## 2024-06-05 NOTE — Telephone Encounter (Signed)
 Phone rep was asked to call pt and cx 11/25 appointment , not needed since she just saw Dr Dohmeier very recently.  After checking DPR a detailed message was left

## 2024-06-05 NOTE — Progress Notes (Deleted)
 PATIENT: Jacqueline Gonzalez DOB: [birth year]  REASON FOR VISIT: follow up HISTORY FROM: patient  No chief complaint on file.    HISTORY OF PRESENT ILLNESS:  06/05/24 ALL:  Lorree Millar is a 23 y.o. female here today for follow up for OSA on CPAP.      HISTORY: (copied from Dr Dohmeier's previous note)  Chief concern according to patient :  I  am doing a bit better but forgot the CPAP at home when I travelled recently  I am now working from home , I work from 8 PM and through 8 AM,  nights but from home, I still have always a headache or eye pain. Daytime sleep is only 4-5 hours long.   When I saw Mrs. Tomaso the first time she had been referred upon her rheumatologist recommendations, Dr. Lonni Ester.  This was on 11/04/2023.  The patient had multiple regional pain syndromes, she has a primary neurologist Dr. Skeet, and [facility].  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  Her BMI was 40.8 last time we met and is now 41.5.  Neck circumference 16.5 inches.  The patient was able to collect 6 hours of sleep time on her home sleep test device with 22.7% REM sleep.  And she had very mild apnea her AHI was only 7.0 and while on CPAP this has been decreased to less than 1 it is still I feel not worse for her to sacrifice her sleep.  So based on her experience that she is not able to incorporate the CPAP into her sleep regimen and have no benefit in terms of headache control I feel that there is a limited benefit with a lot of discomfort for this patient.     Ms Pack listened to my dictation and is willing to give it 4 hours each night on CPAP, she has sleep aid medication at home.  We will use CPAP for another 6 months and schedule a RV with NP.  Trazodone   50 mg , 1/2 tab = 25 mg.  Weight loss is the long term best solution, she may need to speak to her FP/ PCP about  medication to support this effort.    REVIEW OF SYSTEMS: Out of a complete 14 system review of symptoms, the patient complains only of the following symptoms, and all other reviewed systems are negative.  ESS:  ALLERGIES: Allergies  Allergen Reactions   Blue Dyes (Parenteral) Anaphylaxis   Blueberry [Vaccinium Angustifolium] Anaphylaxis   Raspberry Anaphylaxis   Shellfish Allergy Anaphylaxis    ALL SHELLFISH   Amitriptyline  Other (See Comments)    Fast heart rate and throat closing   Sumatriptan  Swelling    Throat swelling/Increased HR per pt    HOME MEDICATIONS: Outpatient Medications Prior to Visit  Medication Sig Dispense Refill   ACCRUFER 30 MG CAPS TAKE 1 CAPSULE BY MOUTH TWICE A DAY BEFORE MEALS FOR ANEMIA.     albuterol  (VENTOLIN  HFA) 108 (90 Base) MCG/ACT inhaler Inhale into the lungs every 6 (six) hours as needed for wheezing or shortness of breath.     buPROPion (WELLBUTRIN XL) 150 MG 24 hr tablet Take by mouth.     cetirizine  (ZYRTEC ) 10 MG tablet Take 1 tablet (10  mg total) by mouth daily. 30 tablet 0   cyclobenzaprine  (FLEXERIL ) 10 MG tablet Take 1 tablet (10 mg total) by mouth at bedtime. 30 tablet 2   Drospirenone  (SLYND ) 4 MG TABS Take 1 tablet (4 mg total) by mouth daily. (Patient not taking: Reported on 03/28/2024) 90 tablet 3   DULoxetine  (CYMBALTA ) 30 MG capsule Take 30 mg by mouth every morning.     famotidine  (PEPCID ) 40 MG tablet TAKE 1 TABLET BY MOUTH EVERYDAY AT BEDTIME 90 tablet 3   fluticasone  (FLONASE ) 50 MCG/ACT nasal spray Place 2 sprays into both nostrils daily. 16 g 0   hydroxychloroquine  (PLAQUENIL ) 200 MG tablet Take 2 tablets (400 mg total) by mouth daily. 180 tablet 0   hydrOXYzine  (VISTARIL ) 25 MG capsule Take 25 mg by mouth 2 (two) times daily as needed.     ibuprofen  (ADVIL ) 600 MG tablet Take 1 tablet (600 mg total) by mouth every  6 (six) hours as needed. 30 tablet 0   lidocaine  (XYLOCAINE ) 5 % ointment Apply 1 Application topically as needed. 35 g 0   medroxyPROGESTERone  (PROVERA ) 10 MG tablet Take 1 tablet (10 mg total) by mouth daily. Use for ten days (Patient not taking: Reported on 03/28/2024) 10 tablet 0   omeprazole  (PRILOSEC) 20 MG capsule Take 1 capsule (20 mg total) by mouth 2 (two) times daily before a meal.     ondansetron  (ZOFRAN ) 4 MG tablet Take 4 mg by mouth 2 (two) times daily as needed.     promethazine  (PHENERGAN ) 25 MG tablet Take 25 mg by mouth every 4 (four) hours.     Psyllium (METAMUCIL) 48.57 % POWD 1 spoonful daily for the next 14 days     SYMBICORT 80-4.5 MCG/ACT inhaler Inhale 2 puffs into the lungs 2 (two) times daily.     traZODone  (DESYREL ) 50 MG tablet TAKE 1 TABLET BY MOUTH EVERYDAY AT BEDTIME 90 tablet 2   Vitamin D , Ergocalciferol , (DRISDOL) 1.25 MG (50000 UNIT) CAPS capsule Take 50,000 Units by mouth every 7 (seven) days.     VRAYLAR 1.5 MG capsule Take 1.5 mg by mouth daily.     No facility-administered medications prior to visit.    PAST MEDICAL HISTORY: Past Medical History:  Diagnosis Date   Anemia    Anxiety    Arthritis    knees   Chlamydia 11/13/2021   Constipation, chronic    Depression    Dyspnea    ON OCC   Environmental allergies    Ganglion cyst of dorsum of left wrist    RECURRENT   GERD (gastroesophageal reflux disease)    watches diet   Headache MIGRAINE    History of hypertension    IN PAST, NO RECENT HTN PER PT 01-19-2023   IBS (irritable bowel syndrome)    Lactose intolerance    PCOS (polycystic ovarian syndrome)    Pre-diabetes     PAST SURGICAL HISTORY: Past Surgical History:  Procedure Laterality Date   GANGLION CYST EXCISION Left 04/24/2021   Procedure: left recurrent dorsal carpal ganglion excision;  Surgeon: Alyse Agent, MD;  Location: [facility];  Service: Orthopedics;  Laterality: Left;   INCISION AND DRAINAGE PERIRECTAL  ABSCESS N/A 03/14/2022   Procedure: IRRIGATION AND DEBRIDEMENT PERIRECTAL ABSCESS;  Surgeon: Sheldon Standing, MD;  Location: [facility];  Service: General;  Laterality: N/A;   LAPAROSCOPY N/A 02/10/2023   Procedure: LAPAROSCOPY DIAGNOSTIC;  Surgeon: Izell Harari, MD;  Location: [facility];  Service: Gynecology;  Laterality:  N/A;   TONSILLECTOMY AND ADENOIDECTOMY     AGE 85   WISDOM TOOTH EXTRACTION  03/18/2021   WRIST GANGLION EXCISION Left 02/2020    FAMILY HISTORY: Family History  Problem Relation Age of Onset   Heart failure Mother    Migraines Mother    Irritable bowel syndrome Mother    Other Mother 29       Tricuspid valve regurgitation   Asthma Father    Ovarian cancer Maternal Aunt    Graves' disease Paternal Aunt    Hypertension Maternal Grandmother    Diabetes Maternal Grandmother    Hypertension Maternal Grandfather    Diabetes Maternal Grandfather    Hypertension Paternal Grandmother    Stroke Paternal Grandmother    Diabetes Paternal Grandmother    Hypertension Paternal Grandfather    Diabetes Paternal Grandfather    Kidney disease Paternal Grandfather    Stomach cancer Neg Hx    Esophageal cancer Neg Hx    Colon cancer Neg Hx     SOCIAL HISTORY: Social History   Socioeconomic History   Marital status: Single    Spouse name: Not on file   Number of children: 0   Years of education: Not on file   Highest education level: Not on file  Occupational History   Occupation: student  Tobacco Use   Smoking status: Never    Passive exposure: Past   Smokeless tobacco: Never  Vaping Use   Vaping status: Never Used  Substance and Sexual Activity   Alcohol use: Yes    Comment: occ   Drug use: Never    Comment: Smokes CBD occ   Sexual activity: Yes    Birth control/protection: OCP  Other Topics Concern   Not on file  Social History Narrative   Not on file   Social Drivers of Health   Financial Resource Strain: Not on file  Food Insecurity:  Food Insecurity Present (10/21/2023)   Hunger Vital Sign    Worried About Running Out of Food in the Last Year: Often true    Ran Out of Food in the Last Year: Often true  Transportation Needs: Unmet Transportation Needs (10/21/2023)   PRAPARE - Transportation    Lack of Transportation (Medical): Yes    Lack of Transportation (Non-Medical): Yes  Physical Activity: Not on file  Stress: No Stress Concern Present (07/04/2021)   [employer] - Occupational Stress Questionnaire    Feeling of Stress : Only a little  Recent Concern: Stress - Stress Concern Present (06/20/2021)   [employer] - Occupational Stress Questionnaire    Feeling of Stress : To some extent  Social Connections: Unknown (03/31/2023)   Received from [facility]   Social Network    Social Network: Not on file  Intimate Partner Violence: Unknown (03/31/2023)   Received from [facility]   HITS    Physically Hurt: Not on file    Insult or Talk Down To: Not on file    Threaten Physical Harm: Not on file    Scream or Curse: Not on file     PHYSICAL EXAM  There were no vitals filed for this visit. There is no height or weight on file to calculate BMI.  Generalized: Well developed, in no acute distress  Cardiology: normal rate and rhythm, no murmur noted Respiratory: clear to auscultation bilaterally  Neurological examination  Mentation: Alert oriented to time, place, history taking. Follows all commands speech and language fluent Cranial nerve  II-XII: Pupils were equal round reactive to light. Extraocular movements were full, visual field were full  Motor: The motor testing reveals 5 over 5 strength of all 4 extremities. Good symmetric motor tone is noted throughout.  Gait and station: Gait is normal.    DIAGNOSTIC DATA (LABS, IMAGING, TESTING) - I reviewed patient records, labs, notes, testing and imaging myself where available.      No data to display            Lab Results  Component Value Date   WBC 6.0 02/01/2024   HGB 10.5 (L) 02/01/2024   HCT 33.2 (L) 02/01/2024   MCV 77.6 (L) 02/01/2024   PLT 281.0 02/01/2024      Component Value Date/Time   NA 137 02/01/2024 1617   NA 138 08/28/2020 1600   K 3.6 02/01/2024 1617   CL 103 02/01/2024 1617   CO2 28 02/01/2024 1617   GLUCOSE 91 02/01/2024 1617   BUN 8 02/01/2024 1617   BUN 6 08/28/2020 1600   CREATININE 0.68 02/01/2024 1617   CALCIUM 9.3 02/01/2024 1617   PROT 7.9 02/01/2024 1617   PROT 7.6 08/28/2020 1600   ALBUMIN 4.2 02/01/2024 1617   ALBUMIN 4.3 08/28/2020 1600   AST 25 02/01/2024 1617   ALT 34 02/01/2024 1617   ALKPHOS 86 02/01/2024 1617   BILITOT 0.3 02/01/2024 1617   BILITOT 0.2 08/28/2020 1600   GFRNONAA >60 09/08/2023 0830   GFRAA 147 08/28/2020 1600   No results found for: CHOL, HDL, LDLCALC, LDLDIRECT, TRIG, CHOLHDL Lab Results  Component Value Date   HGBA1C 5.1 11/27/2021   Lab Results  Component Value Date   VITAMINB12 277 03/14/2022   Lab Results  Component Value Date   TSH 1.540 08/24/2023     ASSESSMENT AND PLAN 23 y.o. year old female  has a past medical history of Anemia, Anxiety, Arthritis, Chlamydia (11/13/2021), Constipation, chronic, Depression, Dyspnea, Environmental allergies, Ganglion cyst of dorsum of left wrist, GERD (gastroesophageal reflux disease), Headache MIGRAINE, History of hypertension, IBS (irritable bowel syndrome), Lactose intolerance, PCOS (polycystic ovarian syndrome), and Pre-diabetes. here with   No diagnosis found.    Emmalynn Pinkham is doing well on CPAP therapy. Compliance report reveals ***. *** was encouraged to continue using CPAP nightly and for greater than 4 hours each night. We will update supply orders as indicated. Risks of untreated sleep apnea review and education materials provided. Healthy lifestyle habits encouraged. *** will follow up in ***, sooner if needed. *** verbalizes understanding  and agreement with this plan.    No orders of the defined types were placed in this encounter.    No orders of the defined types were placed in this encounter.     Greig Forbes, FNP-C 06/05/2024, 4:13 PM [facility] [address], [address] [place], [place] [address] [phone]

## 2024-06-07 ENCOUNTER — Ambulatory Visit: Attending: Internal Medicine | Admitting: Internal Medicine

## 2024-06-07 ENCOUNTER — Encounter: Payer: Self-pay | Admitting: Internal Medicine

## 2024-06-07 VITALS — BP 127/84 | HR 93 | Temp 97.9°F | Resp 15 | Ht 68.0 in | Wt 272.0 lb

## 2024-06-07 DIAGNOSIS — M222X1 Patellofemoral disorders, right knee: Secondary | ICD-10-CM | POA: Diagnosis not present

## 2024-06-07 DIAGNOSIS — M2550 Pain in unspecified joint: Secondary | ICD-10-CM | POA: Diagnosis present

## 2024-06-07 DIAGNOSIS — M1380 Other specified arthritis, unspecified site: Secondary | ICD-10-CM | POA: Diagnosis present

## 2024-06-07 DIAGNOSIS — M797 Fibromyalgia: Secondary | ICD-10-CM | POA: Diagnosis present

## 2024-06-07 MED ORDER — CELECOXIB 200 MG PO CAPS: 200.0000 mg | ORAL_CAPSULE | Freq: Two times a day (BID) | ORAL | 2 refills | Status: AC | PRN

## 2024-06-07 MED ORDER — CYCLOBENZAPRINE HCL 10 MG PO TABS: 10.0000 mg | ORAL_TABLET | Freq: Every evening | ORAL | 0 refills | Status: AC | PRN

## 2024-06-07 NOTE — Progress Notes (Signed)
 Office Visit Note  Patient: Jacqueline Gonzalez             Date of Birth: January 18, 2001           MRN: [id number]             PCP: Tann, Samandra, NP Referring: Tann, Samandra, NP Visit Date: 06/07/2024   Subjective:  Medication Management (Patient states she has been having sores randomly pop up on her body. Right knee is having increased pain. )    Discussed the use of AI scribe software for clinical note transcription with the patient, who gave verbal consent to proceed.  History of Present Illness   Jacqueline Gonzalez is a 23 year old female who presents with increased knee pain after decreasing hydroxychloroquine .  She has experienced increased joint pain, particularly in the right knee, after reducing her hydroxychloroquine  (Plaquenil ) to one tablet daily. The knee pain has been worsening over the past two to three months, with severe pain when getting up from a seated position, such as from the toilet. She experiences shooting pains in the knee even when lying in bed, and the pain is described as being in the front of the knee.  She has been taking ibuprofen  800 mg every six hours for overall body pain, including knee, stomach, and head pain. Previously, she took one to four pills of 800 mg ibuprofen  depending on the pain severity. She also takes Tylenol  occasionally. No recent falls or injuries to her knee. She reports feeling stiff and experiencing pain throughout her body, including her neck, shoulders, arms, upper and lower back, and hips. No recent swelling associated with her recent illness.  She experienced a viral illness two to three weeks ago, for which she initially took amoxicillin  but discontinued due to a yeast infection. She treated the yeast infection, but the rash recurred. She suspects the rash is related to the yeast infection. She reports random sores and scabs on her body, which are itchy and appear on her legs both exposed and covered areas.  She takes famotidine  and  omeprazole  for acid reflux, with omeprazole  taken twice daily or with meals, and famotidine  once at night. Despite this, she continues to experience significant acid reflux, which may be exacerbated by high-dose ibuprofen  use. She mentions a history of being told that the lining of her stomach was tearing due to high fructose consumption, which she has not altered.       Previous HPI 03/09/24 Jacqueline Gonzalez is a 23 y.o. female here for follow up  for continued joint pain at multiple areas on HCQ 400 mg daily.  She complains of worsening knee pain and abdominal pain with blood in stool.   She experiences worsening knee pain, particularly when trying to get up, walking down stairs, and driving. The pain is severe and more pronounced than her previous back pain. She has been on hydroxychloroquine , but has not noticed any improvement in her symptoms despite a change in dosage a few months ago. She also takes Cymbalta  but does not feel it helps with her joint pain.   She experiences abdominal pain intermittently and has noticed bright red blood in her stool, which she suspects might be due to a fissure. The abdominal pain is severe, and she has not mentioned any specific treatments for this issue. She confirms the presence of bright red blood in her stool and severe abdominal pain.   She mentions having had a cold a few weeks ago and attributes some  of her symptoms to a mold problem in her house, which was inadequately addressed by cleaning with Lysol and spray painting over it.       Previous HPI 10/07/2023 Jacqueline Gonzalez is a 23 y.o. female here for follow up for continued joint pain at multiple areas on HCQ 400 mg daily.   She experiences persistent abdominal pain throughout the day and night. A CT scan of the abdomen on January 19th did not reveal any abnormalities, and a previous endoscopy showed no ulcers or inflammatory lesions. She was initially recommended pantoprazole  for stomach acid  suppression due to concerns about gastritis but has since switched to Mytesi and omeprazole  without significant improvement. She has not had an ultrasound to evaluate the liver or gallbladder.   She experiences nausea and diarrhea, with blood in her stool approximately every two weeks. She describes being usually constipated, with difficulty in bowel movements. She was evaluated by a gastroenterologist less than a month ago, who suggested her symptoms might be related to IBS. She was recently prescribed Cymbalta  by her psychiatrist, which she started a couple of weeks ago.   She reports joint pain and stiffness, particularly in the mornings, with numbness in her hands and tingling in her feet. She has been diagnosed with carpal tunnel syndrome in one hand. She experiences significant fatigue and difficulty performing daily activities, such as washing dishes and driving. She reports poor sleep, getting only about four hours per night, and sometimes sleeping excessively during the day. She has not had a sleep study or seen a neurologist for these issues.   She mentions a history of rashes, which have been minor recently. She uses a cream, possibly triamcinolone, for her skin. She also takes gabapentin  but has not used muscle relaxers like Flexeril . She recalls a possible flu-like illness since her last visit but no major infections. She has not had any recent exposure to ticks or been tested for Lyme disease.    Previous HPI 07/09/2023 Jacqueline Gonzalez is a 23 y.o. female here for follow up for continued joint pain at multiple areas currently having trouble especially in her wrist and ankles.  She reports no significant improvement despite the initiation of Plaquenil  200 mg daily. They describe a new onset of skin issues, characterized by abnormal healing of scars and easy bruising, particularly after blood draws. The patient also reports constant pain, which is severe enough to consider daily hospital  visits, but denies any specific aggravating factors. Over-the-counter pain medications, such as ibuprofen  and Tylenol , are currently being used for pain management.   The patient was recently seen by a gynecologist, who recommended a gastroenterology consult, scheduled for February. They also report a recent bout of bronchitis, which was treated with prednisone  and antibiotics. During this time, they also had a tooth infection, which was managed by extraction. The patient did not notice any significant changes while on the short course of steroids.   The patient also reports occasional blood in their stool, which varies from bright red to black. The patient also has a history of eczema, which has been resistant to previous treatments. They deny any circulation problems in the colder weather.      Previous HPI 04/12/2023 Jacqueline Gonzalez is a 23 y.o. female here for follow up for continued joint pain at multiple areas currently having trouble especially in her wrist and ankles.  So far does not feel hydroxychloroquine  has changed her symptoms dramatically. Also still has ongoing lower abdominal and pelvic pain chronically.  She had laparoscopy for this with an adhesion found but not thought clinically important. Still also has persistent diarrhea and constipation.  Has been getting some itchy rash and describes redness along the inner sides and underneath her breasts was recommended to use her topical eczema cream but not seeing any benefit so far.   Previous HPI 02/09/2023 Jacqueline Gonzalez is a 23 y.o. female here for follow up for continued joint pain at multiple areas currently having trouble especially in her wrist and ankles.  Workup at our initial visit showed a high sedimentation rate high complement C3 and elevated RNP antibody titer so some concern for underlying inflammatory arthritis process.  She is noticing more symptoms around the ganglion cyst of the left wrist and pain especially with  active movement.  Ankle pain and soreness especially with use but not noticing any specific nodules just generalized swelling.   Previous HPI 01/08/23 Jacqueline Gonzalez is a 23 y.o. female here for evaluation of positive ANA associated with joint pains and elevated sedimentation rate.  She reports longstanding history of joint pain in multiple areas going all the way back to adolescence.  However her current problems with persistent and sometimes activity limiting widespread pain has been more prominent in the past 1 year.  This is also associated with worsening chronic fatigue.  She did not recall any preceding serious infection.  She mostly describes this as being all over the but sometimes in particular has had more back and chronic pelvic pain.  Has had low back pain and radicular symptoms extending throughout the left leg up to include part of the perineum.  Also reports previous diagnosis with knee osteoarthritis.  Pain is varying in severity worse on some days than others not associated with much visible swelling or discoloration in affected areas.  Also sensitive to pressure or temperature changes in affected areas.  Previous treatments tried include gabapentin , oxycodone , and high-dose ibuprofen  but did not find any of them highly effective. Lab testing looking into these increase symptoms in February showing positive ANA, sed rate of 40, and deficient vitamin D  at 4.6.  She was also started on 50,000 units weekly supplementation for vitamin D  but has not felt a noticeable difference. She has history of eczema has been treated with what sounds like topical emollients in dermatology office but does not currently follow-up.  Describes her skin breaking out and irritation if having prolonged sun exposure.  No specific lesions persisting mostly just the irritation.  She reports several episodes with cystic lesions in the skin under her armpits under the breasts and in the groin.  Sometimes with drainage.  No  previous diagnosis of hidradenitis suppurativa. She intermittently gets thrush possibly associated with her inhaler treatment but she does not use this consistently.  Does not get frequent oral nasal ulcers.  She denies typical Raynaud's symptoms, lymphadenopathy, or history of blood clots. Is also had more trouble with migraine headaches during this year was started on injection treatments for the past 3 months.  Also has persistent irritable bowel symptoms mixed constipation and diarrhea on Bentyl  with some benefit.  She experiences some kind of dizziness or vertiginous symptoms these come and go without a specific position or provocation.  Says sometimes last up to 1 hour at a time.   Family history includes an aunt with Graves' disease but no first-degree relative known autoimmune disease.   Labs reviewed 11/2022 TSH wnl   08/2022 ANA pos ESR 40 Vit D 4.6 RF neg CCP  neg   Review of Systems  Constitutional:  Positive for fatigue.  HENT:  Positive for mouth dryness. Negative for mouth sores.   Eyes:  Negative for dryness.  Respiratory:  Positive for shortness of breath.   Cardiovascular:  Positive for chest pain and palpitations.  Gastrointestinal:  Positive for blood in stool, constipation and diarrhea.  Endocrine: Negative for increased urination.  Genitourinary:  Negative for involuntary urination.  Musculoskeletal:  Positive for joint pain, gait problem, joint pain, myalgias, morning stiffness, muscle tenderness and myalgias. Negative for joint swelling and muscle weakness.  Skin:  Positive for sensitivity to sunlight. Negative for color change, rash and hair loss.  Allergic/Immunologic: Positive for susceptible to infections.  Neurological:  Positive for dizziness and headaches.  Hematological:  Negative for swollen glands.  Psychiatric/Behavioral:  Positive for depressed mood and sleep disturbance. The patient is nervous/anxious.     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 11/04/2023   Chronic pain syndrome 11/04/2023   Insomnia secondary to chronic pain 11/04/2023   CFS (chronic fatigue syndrome) 11/04/2023   Fibromyalgia syndrome 10/07/2023   Intertrigo 04/12/2023   Seronegative inflammatory arthritis 02/09/2023   Precordial pain 01/28/2023   Positive ANA (antinuclear antibody) 01/08/2023   IBS (irritable bowel syndrome) 01/08/2023   Sedimentation rate elevation 01/08/2023   Polyarthralgia 01/08/2023   Vitamin D  deficiency 01/08/2023   PCOS (polycystic ovarian syndrome) 08/26/2022   LGSIL of cervix of undetermined significance 08/26/2022   History of PID 08/18/2022   Rectal abscess supralevator s/p I&D 03/14/2022 03/14/2022   Rectal pain    UTI (urinary tract infection) 03/10/2022   Rectal bleeding    Anal fissure    Abnormal CT scan, colon    Proctocolitis 03/09/2022   Menorrhagia with irregular cycle 11/18/2021   Dysmenorrhea 11/18/2021   Chronic anemia 11/17/2021   Lower abdominal pain 11/07/2021   Moderate episode of recurrent major depressive disorder (HCC) 06/23/2021   Low back pain 05/02/2021   Headache 09/03/2020   Nonspecific abdominal pain 08/30/2020   Ganglion cyst of dorsum of left wrist 11/03/2019   Constipation, chronic 01/21/2018   Irregular periods 01/21/2018   Generalized anxiety disorder 01/21/2018    Past Medical History:  Diagnosis Date   Anemia    Anxiety    Arthritis    knees   Chlamydia 11/13/2021   Constipation, chronic    Depression    Dyspnea    ON OCC   Environmental allergies    Fatty liver    Ganglion cyst of dorsum of left wrist    RECURRENT   GERD (gastroesophageal reflux disease)    watches diet   Headache MIGRAINE    History of hypertension    IN PAST, NO RECENT HTN PER PT 01-19-2023    IBS (irritable bowel syndrome)    Lactose intolerance    PCOS (polycystic ovarian syndrome)    Pre-diabetes     Family History  Problem Relation Age of Onset   Other Mother 75       Tricuspid valve regurgitation and Tumor in Jaw   Heart failure Mother    Migraines Mother    Irritable bowel syndrome Mother    Asthma Father    Ovarian cancer Maternal Aunt    Graves' disease Paternal Aunt    Hypertension  Maternal Grandmother    Diabetes Maternal Grandmother    Hypertension Maternal Grandfather    Diabetes Maternal Grandfather    Hypertension Paternal Grandmother    Stroke Paternal Grandmother    Diabetes Paternal Grandmother    Hypertension Paternal Grandfather    Diabetes Paternal Grandfather    Kidney disease Paternal Grandfather    Stomach cancer Neg Hx    Esophageal cancer Neg Hx    Colon cancer Neg Hx    Past Surgical History:  Procedure Laterality Date   GANGLION CYST EXCISION Left 04/24/2021   Procedure: left recurrent dorsal carpal ganglion excision;  Surgeon: Alyse Agent, MD;  Location: [facility];  Service: Orthopedics;  Laterality: Left;   INCISION AND DRAINAGE PERIRECTAL ABSCESS N/A 03/14/2022   Procedure: IRRIGATION AND DEBRIDEMENT PERIRECTAL ABSCESS;  Surgeon: Sheldon Standing, MD;  Location: [facility];  Service: General;  Laterality: N/A;   LAPAROSCOPY N/A 02/10/2023   Procedure: LAPAROSCOPY DIAGNOSTIC;  Surgeon: Izell Harari, MD;  Location: [facility] [place];  Service: Gynecology;  Laterality: N/A;   TONSILLECTOMY AND ADENOIDECTOMY     AGE 41   WISDOM TOOTH EXTRACTION  03/18/2021   WRIST GANGLION EXCISION Left 02/2020   Social History   Social History Narrative   Not on file   Immunization History  Administered Date(s) Administered   HPV 9-valent 02/19/2012, 12/19/2012, 05/01/2013   Influenza,inj,Quad PF,6+ Mos 06/15/2018   Meningococcal Mcv4o 11/21/2018   Moderna Sars-Covid-2 Vaccination 11/22/2019, 12/20/2019      Objective: Vital Signs: BP 127/84 (BP Location: Left Arm, Patient Position: Sitting, Cuff Size: Small)   Pulse 93   Temp 97.9 F (36.6 C)   Resp 15   Ht 5' 8 (1.727 m)   Wt 272 lb (123.4 kg)   LMP  (LMP Unknown)   BMI 41.36 kg/m    Physical Exam Constitutional:      Appearance: She is obese.  Eyes:     Conjunctiva/sclera: Conjunctivae normal.  Cardiovascular:     Rate and Rhythm: Normal rate and regular rhythm.  Pulmonary:     Effort: Pulmonary effort is normal.     Breath sounds: Normal breath sounds.  Lymphadenopathy:     Cervical: No cervical adenopathy.  Skin:    General: Skin is warm and dry.     Comments: Few excoriated papules on upper arm extensor surfaces, on shins  Neurological:     Mental Status: She is alert.  Psychiatric:        Mood and Affect: Mood normal.      Musculoskeletal Exam:  Widespread upper and lower back and shoulders tenderness to pressure not localized to joints Lateral hip tenderness and pain with internal rotation Wrists full ROM no tenderness or swelling Fingers full ROM no tenderness or swelling Knees full ROM, hamstring tightness with full extension while seating, focal tenderness to pressure at patellar tendin origin b/l without nodule or swelling Ankles full ROM no tenderness or swelling   Investigation: No additional findings.  Imaging: No results found.  Recent Labs: Lab Results  Component Value Date   WBC 6.0 02/01/2024   HGB 10.5 (L) 02/01/2024   PLT 281.0 02/01/2024   NA 137 02/01/2024   K 3.6 02/01/2024   CL 103 02/01/2024   CO2 28 02/01/2024   GLUCOSE 91 02/01/2024   BUN 8 02/01/2024   CREATININE 0.68 02/01/2024   BILITOT 0.3 02/01/2024   ALKPHOS 86 02/01/2024   AST 25 02/01/2024   ALT 34 02/01/2024   PROT 7.9  02/01/2024   ALBUMIN 4.2 02/01/2024   CALCIUM 9.3 02/01/2024   GFRAA 147 08/28/2020    Speciality Comments: No specialty comments available.  Procedures:  No procedures  performed Allergies: Blue dyes (parenteral), Blueberry [vaccinium angustifolium], Raspberry, Shellfish allergy, Amitriptyline , and Sumatriptan    Assessment / Plan:     Visit Diagnoses: Fibromyalgia syndrome - Plan: celecoxib  (CELEBREX ) 200 MG capsule, cyclobenzaprine  (FLEXERIL ) 10 MG tablet Widespread muscular and myofascial pain likely due to muscle tightness and over-engagement. - Trial of Celebrex  200 mg twice daily as alternative to the high-dose ibuprofen  given her GI history - Flexeril  10 mg at night  Patellofemoral pain syndrome, right knee Chronic anterior knee pain exacerbated by weight-bearing and extension, consistent with patellofemoral pain syndrome. - Referred to physical therapy for right knee rehabilitation. - Provided information on patellofemoral pain syndrome and exercises.  Gastroesophageal reflux disease with NSAID-induced gastritis Chronic GERD exacerbated by high-dose ibuprofen , causing stomach irritation. - Initiated celecoxib  as alternative anti-inflammatory. - Continue famotidine  and omeprazole  for acid suppression.  Pruritic skin eruptions, unspecified etiology Intermittent pruritic skin eruptions with no specific etiology identified.  Looks more like atopic process or possibly drug eruption but not typical for primary autoimmune disorder.    Orders: No orders of the defined types were placed in this encounter.  Meds ordered this encounter  Medications   celecoxib  (CELEBREX ) 200 MG capsule    Sig: Take 1 capsule (200 mg total) by mouth 2 (two) times daily as needed.    Dispense:  60 capsule    Refill:  2   cyclobenzaprine  (FLEXERIL ) 10 MG tablet    Sig: Take 1 tablet (10 mg total) by mouth at bedtime as needed for muscle spasms.    Dispense:  90 tablet    Refill:  0     Follow-Up Instructions: No follow-ups on file.   Lonni LELON Ester, MD  Note - This record has been created using [employer].  Chart creation errors have been sought, but  may not always  have been located. Such creation errors do not reflect on  the standard of medical care.

## 2024-06-07 NOTE — Patient Instructions (Addendum)
 Try taking the celebex 200 mg up to twice daily as needed in place of ibuprofen , this medication is designed to cause less irritation in the stomach.  I recommend checking out the [facility] [place]  patient-centered guide for fibromyalgia and chronic pain management: [email or web address]

## 2024-06-09 ENCOUNTER — Ambulatory Visit: Admitting: Internal Medicine

## 2024-06-13 ENCOUNTER — Ambulatory Visit: Admitting: Family Medicine

## 2024-06-19 NOTE — Addendum Note (Signed)
 Addended by: Caytlin Better M on: 06/19/2024 12:01 PM   Modules accepted: Orders

## 2024-06-22 ENCOUNTER — Ambulatory Visit: Admitting: Obstetrics and Gynecology

## 2024-07-14 ENCOUNTER — Ambulatory Visit

## 2024-07-14 VITALS — BP 119/82 | HR 87 | Ht 69.0 in | Wt 276.0 lb

## 2024-07-14 DIAGNOSIS — M546 Pain in thoracic spine: Secondary | ICD-10-CM | POA: Diagnosis not present

## 2024-07-14 DIAGNOSIS — Z6841 Body Mass Index (BMI) 40.0 and over, adult: Secondary | ICD-10-CM | POA: Diagnosis not present

## 2024-07-14 DIAGNOSIS — R21 Rash and other nonspecific skin eruption: Secondary | ICD-10-CM

## 2024-07-14 DIAGNOSIS — M542 Cervicalgia: Secondary | ICD-10-CM

## 2024-07-14 DIAGNOSIS — G8929 Other chronic pain: Secondary | ICD-10-CM

## 2024-07-14 DIAGNOSIS — N62 Hypertrophy of breast: Secondary | ICD-10-CM | POA: Diagnosis not present

## 2024-07-14 NOTE — Progress Notes (Signed)
 BREAST REDUCTION CONSULT Plastic & Reconstructive Surgery New Patient Visit  Patient: Jacqueline Gonzalez MRN: [id number] Date: 07/14/2024 Referring Physician: Tann, Samandra, NP  Chief Complaint: Symptomatic macromastia, cervicalgia  History of Present Illness:  This is a 23 y.o. woman with PMH and PSH as described below who presents in consultation for breast reduction.   The patient states she has been considering a breast reduction for years. She describes intermittent skin irritation in the breast folds and occasional rashes.   Back pain in the upper and lower back, including neck pain. She pulls or pins her bra straps to provide better lift and relief of the pressure and pain. She notices relief by holding her breast up manually.  Her shoulder straps cause grooves and pain and pressure that requires padding for relief. Pain medication is sometimes required with motrin  and tylenol .  Activities that are hindered by enlarged breasts include: exercise and running.  She has tried supportive clothing as well as fitted bras without improvement.  She currently wears a H cup bra and would ideally like to be a D cup.   She has no personal history of breast abnormalities and has never had any breast biopsies or surgeries. Has not had a mammogram due to age.   She no recent weight changes. Of note, she has not breastfed children and is not done child bearing.   Body surface area is 2.47 meters squared.  Past Medical History: Past Medical History:  Diagnosis Date   Anemia    Anxiety    Arthritis    knees   Chlamydia 11/13/2021   Constipation, chronic    Depression    Dyspnea    ON OCC   Environmental allergies    Fatty liver    Ganglion cyst of dorsum of left wrist    RECURRENT   GERD (gastroesophageal reflux disease)    watches diet   Headache MIGRAINE    History of hypertension    IN PAST, NO RECENT HTN PER PT 01-19-2023   IBS (irritable bowel syndrome)    Lactose intolerance     PCOS (polycystic ovarian syndrome)    Pre-diabetes     Past Surgical History: Past Surgical History:  Procedure Laterality Date   GANGLION CYST EXCISION Left 04/24/2021   Procedure: left recurrent dorsal carpal ganglion excision;  Surgeon: Alyse Agent, MD;  Location: [facility];  Service: Orthopedics;  Laterality: Left;   INCISION AND DRAINAGE PERIRECTAL ABSCESS N/A 03/14/2022   Procedure: IRRIGATION AND DEBRIDEMENT PERIRECTAL ABSCESS;  Surgeon: Sheldon Standing, MD;  Location: [facility];  Service: General;  Laterality: N/A;   LAPAROSCOPY N/A 02/10/2023   Procedure: LAPAROSCOPY DIAGNOSTIC;  Surgeon: Izell Harari, MD;  Location: [facility];  Service: Gynecology;  Laterality: N/A;   TONSILLECTOMY AND ADENOIDECTOMY     AGE 57   WISDOM TOOTH EXTRACTION  03/18/2021   WRIST GANGLION EXCISION Left 02/2020   Current Medications: Medications Ordered Prior to Encounter[1] Allergies: Allergies[2]  Family History:  History of breast cancer in great aunt. Otherwise, family history is negative for bleeding/clotting disorders, problems with anesthesia, connective tissue disorders.   Social History:  Social History   Socioeconomic History   Marital status: Single    Spouse name: Not on file   Number of children: 0   Years of education: Not on file   Highest education level: Not on file  Occupational History   Occupation: student  Tobacco Use   Smoking status: Never    Passive exposure:  Past   Smokeless tobacco: Never  Vaping Use   Vaping status: Never Used  Substance and Sexual Activity   Alcohol use: Yes    Comment: occ   Drug use: Never    Comment: Smokes CBD occ   Sexual activity: Yes    Birth control/protection: OCP  Other Topics Concern   Not on file  Social History Narrative   Not on file   Social Drivers of Health   Tobacco Use: Low Risk (06/07/2024)   Patient History    Smoking Tobacco Use: Never    Smokeless Tobacco Use: Never     Passive Exposure: Past  Financial Resource Strain: Not on file  Food Insecurity: Food Insecurity Present (10/21/2023)   Hunger Vital Sign    Worried About Radiation Protection Practitioner of Food in the Last Year: Often true    Ran Out of Food in the Last Year: Often true  Transportation Needs: Unmet Transportation Needs (10/21/2023)   PRAPARE - Transportation    Lack of Transportation (Medical): Yes    Lack of Transportation (Non-Medical): Yes  Physical Activity: Not on file  Stress: No Stress Concern Present (07/04/2021)   [employer] - Occupational Stress Questionnaire    Feeling of Stress : Only a little  Recent Concern: Stress - Stress Concern Present (06/20/2021)   [employer] - Occupational Stress Questionnaire    Feeling of Stress : To some extent  Social Connections: Unknown (03/31/2023)   Received from [facility]   Social Network    Social Network: Not on file  Depression (PHQ2-9): High Risk (10/21/2023)   Depression (PHQ2-9)    PHQ-2 Score: 18  Alcohol Screen: Not on file  Housing: Not on file  Utilities: Not on file  Health Literacy: Not on file   Smoker: Denis Recreational drug use: Denies  Review of systems: 10 point review of systems performed and negative except as noted in the HPI  Physical Exam: BP 119/82 (BP Location: Left Arm, Patient Position: Sitting, Cuff Size: Large)   Pulse 87   Ht 5' 9 (1.753 m)   Wt 276 lb (125.2 kg)   SpO2 96%   BMI 40.76 kg/m  Body mass index is 40.76 kg/m.  Physical Exam           General: Well appearing, no apparent distress. Chest: Chest wall without abnormality or obvious deformity. No scoliosis, pectus excavatum or pectus carinatum. Breast: Bilateral macromastia. Grade 3 ptosis bilaterally. Right breast appears larger than left by about 250 grams. Breast parenchyma is fatty. There are no palpable breast masses in either breast. Bilateral NAC viable and sensate. Papules everted  without discharge. NACs positioned along breast meridian bilaterally. Skin quality is poor. There are no skin lesions, scars or dimpling.  - Breast measurements:  Measurements  Right Breast (cm)  Left Breast (cm)  SN-Nipple 48 43  Base Width 20 18  Nipple - IMF 26 25  NAC diameter 12*12 10*13  Internipple Distance 24  Neuro: Moving all four extremities spontaneously.  Psych: Appropriate mood and affect.   Pertinent Imaging:  Assessment: In summary, this is a pleasant 23 y.o. year-old woman presenting for consultation for bilateral breast reduction in setting of symptomatic macromastia. We discussed the operation in detail including the potential incision patterns (e.g., vertical only versus wise pattern), and possible need for surgical drains. Based on her clinical exam, she will likely require a wise pattern with inferior pedicle. We discussed the risks of this procedure  which include but are not limited to: bleeding, infection, seroma, delayed wound healing, wound dehiscence, asymmetries, fat necrosis, hypertrophic and keloid scarring, decreased or loss of nipple sensation, partial or full loss of the NAC, numbness, paresthesias, injuries to arteries/nerves/veins, need for revision procedures, further out-of-pocket expenses for ongoing medical care, and potential need for repeat reduction in the future. We discussed that pregnancy can alter the size and shape of her breasts and revision procedures may be required after pregnancy. We discussed that while she has the same potential to breast feed as a woman who has never had a breast reduction, she may have less milk production and may require formula supplementation. We further discussed the risk of DVT/PE, fat embolism, heart attack, stroke, death as well as the risks of anesthesia. We reviewed the expected recovery period with no heavy activities or lifting >5lbs for 6 weeks postoperatively.   After evaluation today, I believe the patient would  currently not be an appropriate candidate for the operation. Based on her Schnur scale, she would require a 1500 g reduction per side. I think a 1500 g resection would not be attainable through a reduction mammoplasty. The patient is not currently a candidate for the procedure as she has a BMI >40 and is planning on significant weight loss.   We discussed that the patient would only be a candidate for breast reduction surgery should she maintain a stable weight >75months with BMI <40. The patient voiced understanding throughout our discussion today. All questions and concerns were addressed to the patient's apparent satisfaction.   Plan: - Plan for healthy weight and wellness program.  - Follow up with me in 3 months  The sensitive parts of the examination/procedure were performed with MA as chaperone.  The time documented represents the total time spent on the day of the encounter in preparing for and completing the visit. It does not include time spent by ancillary staff, a resident, a fellow, another trainee, or, for shared visits, time spent jointly with the patient or discussing the case or the performance of other separately performed services.  Time spent: 45 minutes.     Anfernee Peschke, MD [facility]  07/14/2024 2:04 PM     [1]  Current Outpatient Medications on File Prior to Visit  Medication Sig Dispense Refill   ACCRUFER 30 MG CAPS TAKE 1 CAPSULE BY MOUTH TWICE A DAY BEFORE MEALS FOR ANEMIA.     albuterol  (VENTOLIN  HFA) 108 (90 Base) MCG/ACT inhaler Inhale into the lungs every 6 (six) hours as needed for wheezing or shortness of breath.     buPROPion (WELLBUTRIN XL) 150 MG 24 hr tablet Take by mouth.     celecoxib  (CELEBREX ) 200 MG capsule Take 1 capsule (200 mg total) by mouth 2 (two) times daily as needed. 60 capsule 2   cetirizine  (ZYRTEC ) 10 MG tablet Take 1 tablet (10 mg total) by mouth daily. 30 tablet 0   cyclobenzaprine  (FLEXERIL ) 10 MG  tablet Take 1 tablet (10 mg total) by mouth at bedtime as needed for muscle spasms. 90 tablet 0   diclofenac Sodium (VOLTAREN) 1 % GEL as needed.     DULoxetine  (CYMBALTA ) 30 MG capsule Take 30 mg by mouth every morning.     famotidine  (PEPCID ) 40 MG tablet TAKE 1 TABLET BY MOUTH EVERYDAY AT BEDTIME 90 tablet 3   fluticasone  (FLONASE ) 50 MCG/ACT nasal spray Place 2 sprays into both nostrils daily. 16 g 0   hydrOXYzine  (VISTARIL ) 25  MG capsule Take 25 mg by mouth 2 (two) times daily as needed.     lidocaine  (XYLOCAINE ) 5 % ointment Apply 1 Application topically as needed. 35 g 0   ondansetron  (ZOFRAN ) 4 MG tablet Take 4 mg by mouth 2 (two) times daily as needed.     promethazine  (PHENERGAN ) 25 MG tablet Take 25 mg by mouth every 4 (four) hours.     Psyllium (METAMUCIL) 48.57 % POWD 1 spoonful daily for the next 14 days     SYMBICORT 80-4.5 MCG/ACT inhaler Inhale 2 puffs into the lungs 2 (two) times daily.     traZODone  (DESYREL ) 50 MG tablet TAKE 1 TABLET BY MOUTH EVERYDAY AT BEDTIME 90 tablet 2   triamcinolone cream (KENALOG) 0.1 % APPLY THIN COAT TO AFFECTED AREA TWICE A DAY     Vitamin D , Ergocalciferol , (DRISDOL) 1.25 MG (50000 UNIT) CAPS capsule Take 50,000 Units by mouth every 7 (seven) days.     VRAYLAR 1.5 MG capsule Take 1.5 mg by mouth daily.     Drospirenone  (SLYND ) 4 MG TABS Take 1 tablet (4 mg total) by mouth daily. (Patient not taking: Reported on 07/14/2024) 90 tablet 3   medroxyPROGESTERone  (PROVERA ) 10 MG tablet Take 1 tablet (10 mg total) by mouth daily. Use for ten days (Patient not taking: Reported on 07/14/2024) 10 tablet 0   omeprazole  (PRILOSEC) 20 MG capsule Take 1 capsule (20 mg total) by mouth 2 (two) times daily before a meal.     No current facility-administered medications on file prior to visit.  [2]  Allergies Allergen Reactions   Blue Dyes (Parenteral) Anaphylaxis   Blueberry [Vaccinium Angustifolium] Anaphylaxis   Raspberry Anaphylaxis   Shellfish Allergy  Anaphylaxis    ALL SHELLFISH   Amitriptyline  Other (See Comments)    Fast heart rate and throat closing   Sumatriptan  Swelling    Throat swelling/Increased HR per pt

## 2024-07-25 ENCOUNTER — Encounter (INDEPENDENT_AMBULATORY_CARE_PROVIDER_SITE_OTHER): Payer: Self-pay

## 2024-09-13 ENCOUNTER — Ambulatory Visit: Admitting: Internal Medicine

## 2024-10-11 ENCOUNTER — Ambulatory Visit

## 2024-11-09 ENCOUNTER — Telehealth: Admitting: Adult Health
